# Patient Record
Sex: Male | Born: 1975
Health system: Southern US, Community
[De-identification: ages and names within clinical notes are randomized; demographics above are authoritative.]

## PROBLEM LIST (undated history)

## (undated) DIAGNOSIS — F32A Depression, unspecified: Secondary | ICD-10-CM

## (undated) DIAGNOSIS — I10 Essential (primary) hypertension: Secondary | ICD-10-CM

## (undated) DIAGNOSIS — N289 Disorder of kidney and ureter, unspecified: Secondary | ICD-10-CM

## (undated) DIAGNOSIS — D649 Anemia, unspecified: Secondary | ICD-10-CM

## (undated) DIAGNOSIS — E119 Type 2 diabetes mellitus without complications: Secondary | ICD-10-CM

---

## 2011-07-28 ENCOUNTER — Emergency Department (HOSPITAL_COMMUNITY)
Admission: EM | Admit: 2011-07-28 | Discharge: 2011-07-28 | Disposition: A | Discharge status: Home / Self Care | Payer: Self-pay | Attending: Emergency Medicine | Admitting: Emergency Medicine

## 2011-07-28 DIAGNOSIS — Z79899 Other long term (current) drug therapy: Secondary | ICD-10-CM | POA: Insufficient documentation

## 2011-07-28 DIAGNOSIS — E119 Type 2 diabetes mellitus without complications: Secondary | ICD-10-CM | POA: Insufficient documentation

## 2011-07-28 DIAGNOSIS — K089 Disorder of teeth and supporting structures, unspecified: Secondary | ICD-10-CM | POA: Insufficient documentation

## 2011-07-28 DIAGNOSIS — I10 Essential (primary) hypertension: Secondary | ICD-10-CM | POA: Insufficient documentation

## 2011-07-28 DIAGNOSIS — K0381 Cracked tooth: Secondary | ICD-10-CM | POA: Insufficient documentation

## 2011-07-28 LAB — GLUCOSE, CAPILLARY: Glucose-Capillary: 303 mg/dL — ABNORMAL HIGH (ref 70–99)

## 2014-01-31 ENCOUNTER — Emergency Department (HOSPITAL_COMMUNITY)
Admission: EM | Admit: 2014-01-31 | Discharge: 2014-01-31 | Disposition: A | Discharge status: Home / Self Care | Payer: Medicaid Other | Source: Home / Self Care | Attending: Emergency Medicine | Admitting: Emergency Medicine

## 2014-01-31 ENCOUNTER — Encounter (HOSPITAL_COMMUNITY): Payer: Self-pay | Admitting: Emergency Medicine

## 2014-01-31 DIAGNOSIS — K047 Periapical abscess without sinus: Secondary | ICD-10-CM

## 2014-01-31 HISTORY — DX: Type 2 diabetes mellitus without complications: E11.9

## 2014-01-31 HISTORY — DX: Essential (primary) hypertension: I10

## 2014-01-31 MED ORDER — MELOXICAM 15 MG PO TABS: 15.0000 mg | ORAL_TABLET | Freq: Every day | ORAL | Status: DC

## 2014-01-31 MED ORDER — METRONIDAZOLE 500 MG PO TABS: 500.0000 mg | ORAL_TABLET | Freq: Three times a day (TID) | ORAL | Status: DC

## 2014-01-31 MED ORDER — OXYCODONE-ACETAMINOPHEN 5-325 MG PO TABS: ORAL_TABLET | ORAL | Status: DC

## 2014-01-31 MED ORDER — HYDROCODONE-ACETAMINOPHEN 5-325 MG PO TABS
2.0000 | ORAL_TABLET | Freq: Once | ORAL | Status: AC
  Administered 2014-01-31: 2 via ORAL

## 2014-01-31 MED ORDER — AMOXICILLIN 500 MG PO CAPS: 500.0000 mg | ORAL_CAPSULE | Freq: Three times a day (TID) | ORAL | Status: DC

## 2014-01-31 MED ORDER — HYDROCODONE-ACETAMINOPHEN 5-325 MG PO TABS
ORAL_TABLET | ORAL | Status: AC
  Filled 2014-01-31: qty 2

## 2014-01-31 NOTE — ED Notes (Signed)
Spoke to wife on discharge and instructed her to drive patient , patient medicated and cannot drive

## 2014-01-31 NOTE — Discharge Instructions (Signed)
Look up the [place] [facility] for free dental clinics. undoomedical.com.asp  Get there early and be prepared to wait. [facility] and [employer] have Copywriter, advertising schools that provide low cost routine dental care.   Other resources: [facility] [place], [place] [phone]  Patients with Medicaid: [place] [address] [place] Phone:  [phone]                                                  Phone:  [phone]  If unable to pay or uninsured, contact:  [facility] or [facility]. to become qualified for the adult dental clinic.  No matter what dental problem you have, it will not get better unless you get good dental care.  If the tooth is not taken care of, your symptoms will come back in time and you will be visiting [place] again in the [facility]gent [place] with a bad toothache.  So, see your dentist as soon as possible.  If you don't have a dentist, we can give you a list of dentists.  Sometimes the most cost effective treatment is removal of the tooth.  This can be done very inexpensively through one of the low cost Dance movement psychotherapist such as the facility on [facility] in [place] [phone]).  The downside to this is that you will have one less tooth and this can effect your ability to chew.  Some other things that can be done for a dental infection include the following:   Rinse your mouth out with hot salt water (1/2 tsp of table salt and a pinch of baking soda in 8 oz of hot water).  You can do this every 2 or 3 hours.  Avoid cold foods, beverages, and cold air.  This will make your symptoms worse.  Sleep with your head elevated.  Sleeping flat will cause your gums and oral tissues to swell and make them hurt more.  You can sleep on several pillows.  Even  better is to sleep in a recliner with your head higher than your heart.  For mild to moderate pain, you can take Tylenol, ibuprofen, or Aleve.  External application of heat by a heating pad, hot water bottle, or hot wet towel can help with pain and speed healing.  You can do this every 2 to 3 hours. Do not fall asleep on a heating pad since this can cause a burn.   Go to www.goodrx.com to look up your medications. This will give you a list of where you can find your prescriptions at the most affordable prices.   RESOURCE GUIDE  Dental Problems  Look up SuperiorMarketers.be.asp for a schedule of the [facility] [facility]'s free dental clinics called [place]. They have clinics all around [place]. Get there early and be prepared to wait.   [facility] [address]  [place],  [address] [phone]  [facility]  [place], [place] [phone]  Patients with Medicaid: [place] [address] [place] Phone:  [phone]                                                  Phone:  [phone]  If unable to pay or uninsured, contact:  [facility] or [facility]. to become qualified for the adult dental clinic.

## 2014-01-31 NOTE — ED Provider Notes (Signed)
  Chief Complaint   Chief Complaint  Patient presents with  . Dental Pain    History of Present Illness   Levi Hodges is a 38 year old male who has a three-day history of pain in his left, upper, first incisor. It's loose and he feels like is about to fall. There is swelling of the gingiva and some purulent drainage. He's also had some headache and neck pain. Denies fevers, chills, facial pain, neck swelling, difficulty swallowing or breathing, chest pain or shortness of breath.  Review of Systems   Other than as noted above, the patient denies any of the following symptoms: Systemic:  No fever or chills. ENT:  No headache, ear ache, sore throat, nasal congestion, facial pain, or swelling. Lymphatic:  No adenopathy. Lungs:  No coughing or shortness of breath.  Ren[place]   Past medical history, family history, social history, meds, and allergies were reviewed. He has diabetes and high blood pressure and takes Lipitor, glimepiride, and lisinopril.  Physical Examination     Vital signs:  BP 146/99  Pulse 78  Temp(Src) 98.3 F (36.8 C) (Oral)  Resp 18  SpO2 98% General:  Alert, oriented, in no distress. ENT:  TMs and canals normal.  Nasal mucosa normal. Mouth exam:  He is widespread dental decay. The left, upper, first incisor is very loose and appears to be about to fall. The gingiva surrounding it is swollen, erythematous, and feels a little fluctuant, although there is no visible collection of pus. No swelling of the tongue the floor the mouth. The pharynx is clear, he is able to open his mouth fully. Neck:  No swelling or adenopathy. Lungs:  Breath sounds clear and equal bilaterally.  No wheezes, rales or rhonchi. Heart:  Regular rhythm.  No gallops or murmers. Skin:  Clear, warm and dry.   Assessment   The encounter diagnosis was Dental abscess.  Plan   1.  Meds:  The following meds were prescribed:   Discharge Medication List as of 01/31/2014  9:58 AM    START taking  these medications   Details  amoxicillin (AMOXIL) 500 MG capsule Take 1 capsule (500 mg total) by mouth 3 (three) times daily., Starting 01/31/2014, Until Discontinued, Normal    meloxicam (MOBIC) 15 MG tablet Take 1 tablet (15 mg total) by mouth daily., Starting 01/31/2014, Until Discontinued, Normal    metroNIDAZOLE (FLAGYL) 500 MG tablet Take 1 tablet (500 mg total) by mouth 3 (three) times daily., Starting 01/31/2014, Until Discontinued, Normal    oxyCODONE-acetaminophen (PERCOCET) 5-325 MG per tablet 1 to 2 tablets every 6 hours as needed for pain., Print        2.  Patient Education/Counseling:  The patient was given appropriate handouts, self care instructions, and instructed in symptomatic relief. Suggested sleeping with head of bed elevated and hot salt water mouthwash.   3.  Follow up:  The patient was told to follow up if no better in 3 to 4 days, if becoming worse in any way, and given some red flag symptoms such as difficulty swallowing or breathing which would prompt immediate return.  Follow up with a dentist as soon as posssible.     Harden Mo, MD 01/31/14 [phone]

## 2014-01-31 NOTE — ED Notes (Signed)
Dental pain, left front tooth pain.  Onset 3 days ago

## 2017-01-05 ENCOUNTER — Encounter: Payer: Self-pay | Admitting: Internal Medicine

## 2017-01-05 ENCOUNTER — Other Ambulatory Visit (INDEPENDENT_AMBULATORY_CARE_PROVIDER_SITE_OTHER): Payer: 59

## 2017-01-05 ENCOUNTER — Encounter: Payer: Self-pay | Admitting: General Practice

## 2017-01-05 ENCOUNTER — Ambulatory Visit (INDEPENDENT_AMBULATORY_CARE_PROVIDER_SITE_OTHER): Payer: 59 | Admitting: Internal Medicine

## 2017-01-05 VITALS — BP 162/106 | HR 85 | Temp 98.4°F | Ht 69.0 in | Wt 192.0 lb

## 2017-01-05 DIAGNOSIS — E1169 Type 2 diabetes mellitus with other specified complication: Secondary | ICD-10-CM | POA: Diagnosis not present

## 2017-01-05 DIAGNOSIS — R7989 Other specified abnormal findings of blood chemistry: Secondary | ICD-10-CM

## 2017-01-05 DIAGNOSIS — N521 Erectile dysfunction due to diseases classified elsewhere: Secondary | ICD-10-CM

## 2017-01-05 DIAGNOSIS — N529 Male erectile dysfunction, unspecified: Secondary | ICD-10-CM | POA: Diagnosis not present

## 2017-01-05 DIAGNOSIS — IMO0001 Reserved for inherently not codable concepts without codable children: Secondary | ICD-10-CM

## 2017-01-05 DIAGNOSIS — Z23 Encounter for immunization: Secondary | ICD-10-CM

## 2017-01-05 DIAGNOSIS — E1165 Type 2 diabetes mellitus with hyperglycemia: Secondary | ICD-10-CM | POA: Diagnosis not present

## 2017-01-05 DIAGNOSIS — Z720 Tobacco use: Secondary | ICD-10-CM | POA: Diagnosis not present

## 2017-01-05 DIAGNOSIS — I10 Essential (primary) hypertension: Secondary | ICD-10-CM | POA: Diagnosis not present

## 2017-01-05 DIAGNOSIS — E1159 Type 2 diabetes mellitus with other circulatory complications: Secondary | ICD-10-CM | POA: Insufficient documentation

## 2017-01-05 DIAGNOSIS — IMO0002 Reserved for concepts with insufficient information to code with codable children: Secondary | ICD-10-CM | POA: Insufficient documentation

## 2017-01-05 DIAGNOSIS — E785 Hyperlipidemia, unspecified: Secondary | ICD-10-CM | POA: Diagnosis not present

## 2017-01-05 DIAGNOSIS — E11319 Type 2 diabetes mellitus with unspecified diabetic retinopathy without macular edema: Secondary | ICD-10-CM | POA: Diagnosis not present

## 2017-01-05 LAB — COMPREHENSIVE METABOLIC PANEL
ALT: 16 U/L (ref 0–53)
AST: 16 U/L (ref 0–37)
Albumin: 4 g/dL (ref 3.5–5.2)
Alkaline Phosphatase: 99 U/L (ref 39–117)
BUN: 12 mg/dL (ref 6–23)
CHLORIDE: 97 meq/L (ref 96–112)
CO2: 27 meq/L (ref 19–32)
Calcium: 9.1 mg/dL (ref 8.4–10.5)
Creatinine, Ser: 1.33 mg/dL (ref 0.40–1.50)
GFR: 76.01 mL/min (ref >60.00)
Glucose, Bld: 465 mg/dL — ABNORMAL HIGH (ref 70–99)
Potassium: 4 mEq/L (ref 3.5–5.1)
Sodium: 132 mEq/L — ABNORMAL LOW (ref 135–145)
Total Bilirubin: 0.4 mg/dL (ref 0.2–1.2)
Total Protein: 7.2 g/dL (ref 6.0–8.3)

## 2017-01-05 LAB — LIPID PANEL
CHOL/HDL RATIO: 6
Cholesterol: 214 mg/dL — ABNORMAL HIGH (ref 0–200)
HDL: 36 mg/dL — ABNORMAL LOW (ref >39.00)
NONHDL: 177.59
TRIGLYCERIDES: 351 mg/dL — AB (ref 0.0–149.0)
VLDL: 70.2 mg/dL — AB (ref 0.0–40.0)

## 2017-01-05 LAB — CBC
HCT: 45.3 % (ref 39.0–52.0)
HEMOGLOBIN: 15.2 g/dL (ref 13.0–17.0)
MCHC: 33.6 g/dL (ref 30.0–36.0)
MCV: 81.8 fl (ref 78.0–100.0)
PLATELETS: 317 10*3/uL (ref 150.0–400.0)
RBC: 5.54 Mil/uL (ref 4.22–5.81)
RDW: 13.5 % (ref 11.5–15.5)
WBC: 11.7 10*3/uL — ABNORMAL HIGH (ref 4.0–10.5)

## 2017-01-05 LAB — HEMOGLOBIN A1C: Hgb A1c MFr Bld: 14 % — ABNORMAL HIGH (ref 4.6–6.5)

## 2017-01-05 LAB — MICROALBUMIN / CREATININE URINE RATIO
Creatinine,U: 96.3 mg/dL
MICROALB UR: 77.8 mg/dL — AB (ref 0.0–1.9)
Microalb Creat Ratio: 80.8 mg/g — ABNORMAL HIGH (ref 0.0–30.0)

## 2017-01-05 LAB — LDL CHOLESTEROL, DIRECT: Direct LDL: 115 mg/dL

## 2017-01-05 LAB — HM DIABETES EYE EXAM

## 2017-01-05 MED ORDER — LISINOPRIL-HYDROCHLOROTHIAZIDE 20-25 MG PO TABS
1.0000 | ORAL_TABLET | Freq: Every day | ORAL | 3 refills | Status: DC
Start: 2017-01-05 — End: 2017-06-02

## 2017-01-05 NOTE — Progress Notes (Unsigned)
Results entered and sent to scan  

## 2017-01-05 NOTE — Assessment & Plan Note (Signed)
BP elevated severely today and off meds for several years with his lack of insurance. Rx for lisinopril/hctz 20/25 today and bring back in 1 month or so for recheck. Checking CMP today and adjust as needed. Not known if complicated although suspect some damage.

## 2017-01-05 NOTE — Assessment & Plan Note (Signed)
Smoking at least 1 PPD and no desire to quit at this time. Has not been successful with quitting in the past. Counseled him about the risks and harms associated with smoking and the need to quit for his health.

## 2017-01-05 NOTE — Assessment & Plan Note (Signed)
Goal LDL <70 since CV risk is about 16% currently. Has been on lipitor in the past without side effects.

## 2017-01-05 NOTE — Assessment & Plan Note (Signed)
Likely vasogenic with his severe uncontrolled hypertension and diabetes. Talked to them about controlling blood pressure and seeing if ED improves. If it does not then he may have permanent damage. Given high BP will not rx viagra at this time until medical problems controlled.

## 2017-01-05 NOTE — Patient Instructions (Signed)
We have given you the tetanus shot today.   We are checking the labs and will call you back with the sugar medicine and if you need a cholesterol medicine.  We have sent in lisinopril/hctz for the blood pressure which is 1 pill daily.   Come back in 1-2 months for follow up of the blood pressure and sugars.

## 2017-01-05 NOTE — Progress Notes (Signed)
   Subjective:    Patient ID: Levi Hodges, male    DOB: 02-Jan-1976, 41 y.o.   MRN: [id number]  HPI The patient is a 41 YO man coming in new for worsening health including his blood pressure (not well controlled, previously was taking lisinopril 40 mg daily, no chest pains, having headaches, no abdominal pain), diabetes (not taking anything right now, no follow up in some time for this due to lack of insurance, denies numbness in his feet or burning, eye exam today without changes), and his cholesterol (associated with his diabetes, not on meds in some time due to lack of insurance, denies chest pains). He also is a smoker and is not ready to quit at this time. Also having ED and wonders if this is related to his diabetes and blood pressure not being controlled. He has tried viagra in the past and it worked (at least 5 years ago). Problems with initiating and maintaining erection.   PMH, [facility], social history reviewed and updated.   Review of Systems  Constitutional: Negative.   HENT: Negative.   Eyes: Negative.   Respiratory: Negative for cough, chest tightness and shortness of breath.   Cardiovascular: Negative for chest pain, palpitations and leg swelling.  Gastrointestinal: Negative for abdominal distention, abdominal pain, constipation, diarrhea, nausea and vomiting.  Genitourinary:       ED  Musculoskeletal: Positive for back pain. Negative for arthralgias, gait problem, joint swelling, myalgias, neck pain and neck stiffness.  Skin: Negative.   Neurological: Positive for headaches. Negative for dizziness, tremors, syncope, weakness, light-headedness and numbness.  Psychiatric/Behavioral: Negative.       Objective:   Physical Exam  Constitutional: He is oriented to person, place, and time. He appears well-developed and well-nourished.  HENT:  Head: Normocephalic and atraumatic.  Eyes: EOM are normal.  Neck: Normal range of motion.  Cardiovascular: Normal rate and regular rhythm.     Pulmonary/Chest: Effort normal and breath sounds normal. No respiratory distress. He has no wheezes. He has no rales.  Abdominal: Soft. Bowel sounds are normal. He exhibits no distension. There is no tenderness. There is no rebound.  Musculoskeletal: He exhibits no edema.  Neurological: He is alert and oriented to person, place, and time. Coordination normal.  Skin: Skin is warm and dry.  Psychiatric: He has a normal mood and affect.   Vitals:   01/05/17 1321 01/05/17 1408  BP: (!) 170/108 (!) 162/106  Pulse: 85   Temp: 98.4 F (36.9 C)   TempSrc: Oral   SpO2: 100%   Weight: 192 lb (87.1 kg)   Height: 5\' 9"  (1.753 m)       Assessment & Plan:  Tdap given at visit

## 2017-01-05 NOTE — Assessment & Plan Note (Addendum)
Previously was on toujeo at some point and cannot tolerate metformin. Checking HgA1c and adjust as needed. Likely will need medication and talked to him about that today. He is willing to do what is needed to bring his sugars down now that he has insurance. Eye exam up to date. Foot exam done today without changes. Checking microalbumin to creatinine ratio for complications.

## 2017-01-05 NOTE — Progress Notes (Signed)
Pre visit review using our clinic review tool, if applicable. No additional management support is needed unless otherwise documented below in the visit note. 

## 2017-01-07 ENCOUNTER — Other Ambulatory Visit: Payer: Self-pay | Admitting: Internal Medicine

## 2017-01-07 MED ORDER — EMPAGLIFLOZIN 25 MG PO TABS: 25.0000 mg | ORAL_TABLET | Freq: Every day | ORAL | 0 refills | Status: DC

## 2017-01-07 MED ORDER — SIMVASTATIN 20 MG PO TABS: 20.0000 mg | ORAL_TABLET | Freq: Every day | ORAL | 3 refills | Status: DC

## 2017-01-07 MED ORDER — GLIPIZIDE ER 10 MG PO TB24: 10.0000 mg | ORAL_TABLET | Freq: Every day | ORAL | 0 refills | Status: DC

## 2017-01-08 ENCOUNTER — Telehealth: Payer: Self-pay

## 2017-01-08 NOTE — Telephone Encounter (Signed)
PA for Levi Hodges has been denied, patient hasn't tried anything else in past and that's why it was denied, wanting to see if we can change medication to something else that is similar. Please advise

## 2017-01-08 NOTE — Telephone Encounter (Signed)
Ok to wait until Dr Sharlet Salina returns

## 2017-01-12 ENCOUNTER — Telehealth: Payer: Self-pay | Admitting: Internal Medicine

## 2017-01-12 MED ORDER — DAPAGLIFLOZIN PROPANEDIOL 10 MG PO TABS: 10.0000 mg | ORAL_TABLET | Freq: Every day | ORAL | 6 refills | Status: DC

## 2017-01-12 NOTE — Telephone Encounter (Signed)
Have sent in [place] which is similar to see if it is covered.

## 2017-01-12 NOTE — Telephone Encounter (Signed)
Pt's spouse called request to speak to the nurse about Mr. Youtsey lab result.

## 2017-01-12 NOTE — Addendum Note (Signed)
Addended by: Pricilla Holm A on: 01/12/2017 11:12 AM   Modules accepted: Orders

## 2017-01-12 NOTE — Telephone Encounter (Signed)
Contacted and stated awareness

## 2017-03-06 ENCOUNTER — Encounter: Payer: Self-pay | Admitting: Internal Medicine

## 2017-03-06 ENCOUNTER — Ambulatory Visit (INDEPENDENT_AMBULATORY_CARE_PROVIDER_SITE_OTHER): Payer: 59 | Admitting: Internal Medicine

## 2017-03-06 ENCOUNTER — Other Ambulatory Visit (INDEPENDENT_AMBULATORY_CARE_PROVIDER_SITE_OTHER): Payer: 59

## 2017-03-06 VITALS — BP 140/90 | HR 64 | Temp 98.9°F | Resp 12 | Ht 69.0 in | Wt 188.0 lb

## 2017-03-06 DIAGNOSIS — I10 Essential (primary) hypertension: Secondary | ICD-10-CM

## 2017-03-06 DIAGNOSIS — E785 Hyperlipidemia, unspecified: Secondary | ICD-10-CM | POA: Diagnosis not present

## 2017-03-06 DIAGNOSIS — E1165 Type 2 diabetes mellitus with hyperglycemia: Secondary | ICD-10-CM

## 2017-03-06 DIAGNOSIS — N521 Erectile dysfunction due to diseases classified elsewhere: Secondary | ICD-10-CM

## 2017-03-06 DIAGNOSIS — E1169 Type 2 diabetes mellitus with other specified complication: Secondary | ICD-10-CM

## 2017-03-06 DIAGNOSIS — E1159 Type 2 diabetes mellitus with other circulatory complications: Secondary | ICD-10-CM

## 2017-03-06 DIAGNOSIS — IMO0002 Reserved for concepts with insufficient information to code with codable children: Secondary | ICD-10-CM

## 2017-03-06 DIAGNOSIS — IMO0001 Reserved for inherently not codable concepts without codable children: Secondary | ICD-10-CM

## 2017-03-06 LAB — COMPREHENSIVE METABOLIC PANEL
ALT: 19 U/L (ref 0–53)
AST: 23 U/L (ref 0–37)
Albumin: 4.3 g/dL (ref 3.5–5.2)
Alkaline Phosphatase: 74 U/L (ref 39–117)
BUN: 19 mg/dL (ref 6–23)
CHLORIDE: 104 meq/L (ref 96–112)
CO2: 26 meq/L (ref 19–32)
CREATININE: 1.4 mg/dL (ref 0.40–1.50)
Calcium: 9.2 mg/dL (ref 8.4–10.5)
GFR: 71.59 mL/min (ref >60.00)
Glucose, Bld: 260 mg/dL — ABNORMAL HIGH (ref 70–99)
Potassium: 3.9 mEq/L (ref 3.5–5.1)
Sodium: 136 mEq/L (ref 135–145)
Total Bilirubin: 0.4 mg/dL (ref 0.2–1.2)
Total Protein: 7.4 g/dL (ref 6.0–8.3)

## 2017-03-06 MED ORDER — DAPAGLIFLOZIN PROPANEDIOL 10 MG PO TABS: 10.0000 mg | ORAL_TABLET | Freq: Every day | ORAL | 6 refills | Status: DC

## 2017-03-06 MED ORDER — GLIPIZIDE ER 10 MG PO TB24: 10.0000 mg | ORAL_TABLET | Freq: Every day | ORAL | 3 refills | Status: DC

## 2017-03-06 NOTE — Progress Notes (Signed)
   Subjective:    Patient ID: Levi Hodges, male    DOB: February 14, 1976, 41 y.o.   MRN: [id number]  HPI The patient is a 41 YO man coming in for follow up of his diabetes (sugars running about 140-200 in the morning, did not get jardiance due to cost and then did not get farxiga due to not knowing it was at the pharmacy, taking glipizide daily, diet about the same, not exercising, some numbness and erectile dysfunction complicating this) and blood pressure (BP better than prior, taking lisinopril/hctz, no headaches or fatigue or abdominal pain or chest pains anymore since starting the medicine, denies side effects), and his cholesterol (started taking simvastatin, denies side effects, no change he has noticed).   Review of Systems  Constitutional: Negative for activity change, appetite change, fatigue, fever and unexpected weight change.  HENT: Negative.   Eyes: Negative.   Respiratory: Negative.   Cardiovascular: Negative.   Gastrointestinal: Negative.   Musculoskeletal: Negative.   Skin: Negative.   Neurological: Positive for numbness. Negative for dizziness, tremors, syncope, facial asymmetry and weakness.  Psychiatric/Behavioral: Negative.       Objective:   Physical Exam  Constitutional: He is oriented to person, place, and time. He appears well-developed and well-nourished.  HENT:  Head: Normocephalic and atraumatic.  Eyes: EOM are normal.  Neck: Normal range of motion.  Cardiovascular: Normal rate and regular rhythm.   Pulmonary/Chest: Effort normal.  Abdominal: Soft.  Musculoskeletal: He exhibits no edema.  Neurological: He is alert and oriented to person, place, and time.  Skin: Skin is warm and dry.  Psychiatric: He has a normal mood and affect.   Vitals:   03/06/17 0929  BP: 140/90  Pulse: 64  Resp: 12  Temp: 98.9 F (37.2 C)  TempSrc: Oral  SpO2: 98%  Weight: 188 lb (85.3 kg)  Height: 5\' 9"  (1.753 m)      Assessment & Plan:

## 2017-03-06 NOTE — Patient Instructions (Signed)
We will check the labs today. We have resent the farxiga to the pharmacy to try to fill.    Diabetes Mellitus and Standards of Medical Care Managing diabetes (diabetes mellitus) can be complicated. Your diabetes treatment may be managed by a team of health care providers, including:  A diet and nutrition specialist (registered dietitian).  A nurse.  A certified diabetes educator (CDE).  A diabetes specialist (endocrinologist).  An eye doctor.  A primary care provider.  A dentist. Your health care providers follow a schedule in order to help you get the best quality of care. The following schedule is a general guideline for your diabetes management plan. Your health care providers may also give you more specific instructions. HbA1c ( hemoglobin A1c) test This test provides information about blood sugar (glucose) control over the previous 2-3 months. It is used to check whether your diabetes management plan needs to be adjusted.  If you are meeting your treatment goals, this test is done at least 2 times a year.  If you are not meeting treatment goals or if your treatment goals have changed, this test is done 4 times a year. Blood pressure test  This test is done at every routine medical visit. For most people, the goal is less than 130/80. Ask your health care provider what your goal blood pressure should be. Dental and eye exams  Visit your dentist two times a year.  If you have type 1 diabetes, get an eye exam 3-5 years after you are diagnosed, and then once a year after your first exam.  If you were diagnosed with type 1 diabetes as a child, get an eye exam when you are age 58 or older and have had diabetes for 3-5 years. After the first exam, you should get an eye exam once a year.  If you have type 2 diabetes, have an eye exam as soon as you are diagnosed, and then once a year after your first exam. Foot care exam  Visual foot exams are done at every routine medical  visit. The exams check for cuts, bruises, redness, blisters, sores, or other problems with the feet.  A complete foot exam is done by your health care provider once a year. This exam includes an inspection of the structure and skin of your feet, and a check of the pulses and sensation in your feet.  Type 1 diabetes: Get your first exam 3-5 years after diagnosis.  Type 2 diabetes: Get your first exam as soon as you are diagnosed.  Check your feet every day for cuts, bruises, redness, blisters, or sores. If you have any of these or other problems that are not healing, contact your health care provider. Kidney function test ( urine microalbumin)  This test is done once a year.  Type 1 diabetes: Get your first test 5 years after diagnosis.  Type 2 diabetes: Get your first test as soon as you are diagnosed.  If you have chronic kidney disease (CKD), get a serum creatinine and estimated glomerular filtration rate (eGFR) test once a year. Lipid profile (cholesterol, HDL, LDL, triglycerides)  This test should be done when you are diagnosed with diabetes, and every 5 years after the first test. If you are on medicines to lower your cholesterol, you may need to get this test done every year.  The goal for LDL is less than 100 mg/dL (5.5 mmol/L). If you are at high risk, the goal is less than 70 mg/dL (3.9 mmol/L).  The goal for triglycerides is less than 150 mg/dL (8.3 mmol/L). Immunizations  The yearly flu (influenza) vaccine is recommended for everyone 6 months or older who has diabetes.  The pneumonia (pneumococcal) vaccine is recommended for everyone 2 years or older who has diabetes. If you are 15 or older, you may get the pneumonia vaccine as a series of two separate shots.  The hepatitis B vaccine is recommended for adults shortly after they have been diagnosed with diabetes.     Mental and emotional health  Screening for symptoms of eating disorders, anxiety, and depression  is recommended at the time of diagnosis and afterward as needed. If your screening shows that you have symptoms (you have a positive screening result), you may need further evaluation and be referred to a mental health care provider. Diabetes self-management education  Education about how to manage your diabetes is recommended at diagnosis and ongoing as needed. Treatment plan  Your treatment plan will be reviewed at every medical visit. Summary  Managing diabetes (diabetes mellitus) can be complicated. Your diabetes treatment may be managed by a team of health care providers.  Your health care providers follow a schedule in order to help you get the best quality of care.  Standards of care including having regular physical exams, blood tests, blood pressure monitoring, immunizations, screening tests, and education about how to manage your diabetes.  Your health care providers may also give you more specific instructions based on your individual health. This information is not intended to replace advice given to you by your health care provider. Make sure you discuss any questions you have with your health care provider. Document Released: 08/17/2009 Document Revised: 07/18/2016 Document Reviewed: 07/18/2016 Elsevier Interactive Patient Education  2017 [employer].

## 2017-03-06 NOTE — Progress Notes (Signed)
Pre visit review using our clinic review tool, if applicable. No additional management support is needed unless otherwise documented below in the visit note. 

## 2017-03-08 NOTE — Assessment & Plan Note (Signed)
BP is better controlled than before but still borderline high. Given that his diabetes is very uncontrolled and he is hesitant to take more meds will watch for now and add medication if still borderline at follow up visit in 3 months.

## 2017-03-08 NOTE — Assessment & Plan Note (Signed)
Taking simvastatin 20 mg daily without problems. Continue and recheck cholesterol in about 6 months.

## 2017-03-08 NOTE — Assessment & Plan Note (Signed)
Taking glipizide and will resend farxiga for better control. Last HgA1c 14% so likely will not be at goal now. Less neuropathy symptoms now with meds than before. Checking fructosamine for control as too early for HgA1c. He is on ACE-I and statin now. He is working on better control but this will be a long process with such poor control for so long.

## 2017-03-09 LAB — FRUCTOSAMINE: FRUCTOSAMINE: 404 umol/L — AB (ref 190–270)

## 2017-03-11 ENCOUNTER — Telehealth: Payer: Self-pay | Admitting: *Deleted

## 2017-03-11 ENCOUNTER — Telehealth: Payer: Self-pay

## 2017-03-11 MED ORDER — CANAGLIFLOZIN 300 MG PO TABS: 300.0000 mg | ORAL_TABLET | Freq: Every day | ORAL | 6 refills | Status: DC

## 2017-03-11 NOTE — Telephone Encounter (Signed)
Tried calling pt to let him know of medciation change # on file is not his #. It was a business and the young lady stated there is no one work here by that name...Levi Hodges

## 2017-03-11 NOTE — Telephone Encounter (Signed)
error 

## 2017-03-11 NOTE — Telephone Encounter (Signed)
Invokana sent in.

## 2017-03-11 NOTE — Telephone Encounter (Signed)
Rec'd fax stating pt insurance plan does not cover [place], the preferred alternative is Invokana tab. Pls call/fax to change medication...Johny Chess

## 2017-03-23 ENCOUNTER — Telehealth: Payer: Self-pay

## 2017-03-23 NOTE — Telephone Encounter (Signed)
error 

## 2017-05-14 ENCOUNTER — Encounter (HOSPITAL_COMMUNITY): Payer: Self-pay

## 2017-05-14 ENCOUNTER — Emergency Department (HOSPITAL_COMMUNITY)
Admission: EM | Admit: 2017-05-14 | Discharge: 2017-05-14 | Disposition: A | Discharge status: Home / Self Care | Payer: 59 | Attending: Emergency Medicine | Admitting: Emergency Medicine

## 2017-05-14 DIAGNOSIS — Z79899 Other long term (current) drug therapy: Secondary | ICD-10-CM | POA: Diagnosis not present

## 2017-05-14 DIAGNOSIS — K0889 Other specified disorders of teeth and supporting structures: Secondary | ICD-10-CM | POA: Diagnosis not present

## 2017-05-14 DIAGNOSIS — F17200 Nicotine dependence, unspecified, uncomplicated: Secondary | ICD-10-CM | POA: Insufficient documentation

## 2017-05-14 DIAGNOSIS — Z7984 Long term (current) use of oral hypoglycemic drugs: Secondary | ICD-10-CM | POA: Insufficient documentation

## 2017-05-14 DIAGNOSIS — I10 Essential (primary) hypertension: Secondary | ICD-10-CM | POA: Insufficient documentation

## 2017-05-14 DIAGNOSIS — E119 Type 2 diabetes mellitus without complications: Secondary | ICD-10-CM | POA: Diagnosis not present

## 2017-05-14 MED ORDER — IBUPROFEN 600 MG PO TABS: 600.0000 mg | ORAL_TABLET | Freq: Four times a day (QID) | ORAL | 0 refills | Status: DC | PRN

## 2017-05-14 MED ORDER — PENICILLIN V POTASSIUM 500 MG PO TABS: 500.0000 mg | ORAL_TABLET | Freq: Two times a day (BID) | ORAL | 0 refills | Status: AC

## 2017-05-14 MED ORDER — HYDROCODONE-ACETAMINOPHEN 5-325 MG PO TABS: 2.0000 | ORAL_TABLET | ORAL | 0 refills | Status: DC | PRN

## 2017-05-14 NOTE — ED Provider Notes (Signed)
[facility] DEPT Provider Note   CSN: [id number] Arrival date & time: 05/14/17  1730  By signing my name below, I, Theresia Bough, attest that this documentation has been prepared under the direction and in the presence of non-physician practitioner, Janeece Agee., PA-C. Electronically Signed: Theresia Bough, ED Scribe. 05/14/17. 7:09 PM.  History   Chief Complaint Chief Complaint  Patient presents with  . Dental Pain   The history is provided by the patient. No language interpreter was used.   HPI Comments: Levi Hodges is a 40 y.o. male who presents to the Emergency Department complaining of a moderate, gradually worsening area of pain and swelling to the left lower gum of the mouth onset 2 weeks ago. Per pt, there has been drainage and associated mild facial swelling. Pt states pain is exacerbated with palpation and direct pressure. Pt has tried Advil for pain with moderate relief. No antibiotic use. Denies fever, chills, trouble swallowing, vomiting, SOB or any other complaints at this time.  Past Medical History:  Diagnosis Date  . Diabetes mellitus without complication ([place])   . Hypertension     Patient Active Problem List   Diagnosis Date Noted  . Uncontrolled type 2 diabetes circulatory disorder erectile dysfunction ([place]) 01/05/2017  . Hyperlipidemia associated with type 2 diabetes mellitus ([place]) 01/05/2017  . Essential hypertension 01/05/2017  . Tobacco abuse 01/05/2017  . ED (erectile dysfunction) 01/05/2017    History reviewed. No pertinent surgical history.     Home Medications    Prior to Admission medications   Medication Sig Start Date End Date Taking? Authorizing Provider  canagliflozin (INVOKANA) 300 MG TABS tablet Take 1 tablet (300 mg total) by mouth daily before breakfast. 03/11/17   Hoyt Koch, MD  glipiZIDE (GLUCOTROL XL) 10 MG 24 hr tablet Take 1 tablet (10 mg total) by mouth daily with breakfast. 03/06/17   Hoyt Koch,  MD  HYDROcodone-acetaminophen (NORCO/VICODIN) 5-325 MG tablet Take 2 tablets by mouth every 4 (four) hours as needed. 05/14/17   Nona Dell, PA-C  ibuprofen (ADVIL,MOTRIN) 600 MG tablet Take 1 tablet (600 mg total) by mouth every 6 (six) hours as needed. 05/14/17   Nona Dell, PA-C  lisinopril-hydrochlorothiazide (PRINZIDE,ZESTORETIC) 20-25 MG tablet Take 1 tablet by mouth daily. 01/05/17   Hoyt Koch, MD  penicillin v potassium (VEETID) 500 MG tablet Take 1 tablet (500 mg total) by mouth 2 (two) times daily. 05/14/17 05/21/17  Nona Dell, PA-C  simvastatin (ZOCOR) 20 MG tablet Take 1 tablet (20 mg total) by mouth at bedtime. 01/07/17   Hoyt Koch, MD    Family History No family history on file.  Social History Social History  Substance Use Topics  . Smoking status: Current Every Day Smoker  . Smokeless tobacco: Never Used  . Alcohol use No     Allergies   Patient has no known allergies.   Review of Systems Review of Systems  Constitutional: Negative for chills and fever.  HENT: Positive for dental problem and facial swelling. Negative for trouble swallowing.   Respiratory: Negative for shortness of breath.   Gastrointestinal: Negative for vomiting.     Physical Exam Updated Vital Signs BP (!) 172/94 (BP Location: Left Arm)   Pulse 78   Temp 98.2 F (36.8 C) (Oral)   Resp 16   Ht 5\' 9"  (1.753 m)   Wt 181 lb (82.1 kg)   SpO2 98%   BMI 26.73 kg/m   Physical Exam  Constitutional:  He is oriented to person, place, and time. He appears well-developed and well-nourished.  HENT:  Head: Normocephalic and atraumatic.  Mouth/Throat: Uvula is midline and oropharynx is clear and moist. No trismus in the jaw. Abnormal dentition. Dental abscesses and dental caries present. No uvula swelling. No oropharyngeal exudate, posterior oropharyngeal edema, posterior oropharyngeal erythema or tonsillar abscesses.    Mild swelling  present to left lower jaw. Floor of mouth soft. Pt tolerating secretions.   Eyes: Conjunctivae and EOM are normal. Right eye exhibits no discharge. Left eye exhibits no discharge. No scleral icterus.  Cardiovascular: Normal rate.   Pulmonary/Chest: Effort normal.  Neurological: He is alert and oriented to person, place, and time.  Nursing note and vitals reviewed.    ED Treatments / Results  DIAGNOSTIC STUDIES: Oxygen Saturation is 98% on RA, normal by my interpretation.   COORDINATION OF CARE: 6:52 PM-Discussed next steps with pt including antibiotic treatment. Pt verbalized understanding and is agreeable with the plan.   Labs (all labs ordered are listed, but only abnormal results are displayed) Labs Reviewed - No data to display  EKG  EKG Interpretation None       Radiology No results found.  Procedures Procedures (including critical care time)  Medications Ordered in ED Medications - No data to display   Initial Impression / Assessment and Plan / ED Course  I have reviewed the triage vital signs and the nursing notes.  Pertinent labs & imaging results that were available during my care of the patient were reviewed by me and considered in my medical decision making (see chart for details).      Patient with dental abscess actively draining. Supportive care and return precautions discussed.  Pt sent home with Penicillin and pain meds. Patient given outpatient dental resources and advised to follow-up for further management. Discussed return precautions. The patient appears reasonably screened and/or stabilized for discharge and I doubt any other emergent medical condition requiring further screening, evaluation, or treatment in the ED prior to discharge.    Final Clinical Impressions(s) / ED Diagnoses   Final diagnoses:  Pain, dental    New Prescriptions New Prescriptions   HYDROCODONE-ACETAMINOPHEN (NORCO/VICODIN) 5-325 MG TABLET    Take 2 tablets by  mouth every 4 (four) hours as needed.   IBUPROFEN (ADVIL,MOTRIN) 600 MG TABLET    Take 1 tablet (600 mg total) by mouth every 6 (six) hours as needed.   PENICILLIN V POTASSIUM (VEETID) 500 MG TABLET    Take 1 tablet (500 mg total) by mouth 2 (two) times daily.   I personally performed the services described in this documentation, which was scribed in my presence. The recorded information has been reviewed and is accurate.     Nona Dell, PA-C 05/14/17 1913    Orlie Dakin, MD 05/14/17 [phone]

## 2017-05-14 NOTE — Discharge Instructions (Signed)
Take medications as prescribed. You may also take 600 mg ibuprofen 4 times daily as needed for pain relief. I recommend eating prior to taking ibuprofen to prevent gastrointestinal side effects. You may apply ice to affected area for 15-20 minutes 3-4 times daily to help with pain. Follow-up with one of the dental clinics listed below for further management of your dental pain. Return to the emergency department if symptoms worsen or new onset of fever, headache, neck stiffness, facial/neck swelling, unable to open jaw, unable to swallow resulting in drooling, difficulty breathing, drainage, unable to tolerate fluids.   [facility] [address] [place], [place] [address] Phone [phone]  [facility] in [place], [place], exemplifies the [employer]?s vision to improve the health and quality of life of all [place] by Regulatory affairs officer with a passion to care for the underserved and by leading the nation in community-based, service learning oral health education.  We are committed to offering comprehensive general dental services for adults, children and special needs patients in a safe, caring and professional setting.   Appointments: Our clinic is open Monday through Friday 8:00 a.m. until 5:00 p.m. The amount of time scheduled for an appointment depends on the patient?s specific needs. We ask that you keep your appointed time for care or provide 24-hour notice of all appointment changes. Parents or legal guardians must accompany minor children.   Payment for Services: Medicaid and other insurance plans are welcome. Payment for services is due when services are rendered and may be made by cash or credit card. If you have dental insurance, we will assist you with your claim submission.    Emergencies:   Emergency services will be provided Monday through Friday on a walk-in basis.  Please arrive early for emergency services. After hours emergency services will be provided for patients of record as required.   Services:  Comprehensive General Dentistry Children?s Dentistry Oral Surgery - Extractions Root Canals Sealants and Tooth Colored Fillings Crowns and Camera operator 3-D/[facility]

## 2017-05-14 NOTE — ED Triage Notes (Signed)
Left lower tooth abscess that busted today while pt was at work. PT states he ned antibiotic. Denies fevers/chills

## 2017-06-02 ENCOUNTER — Encounter: Payer: Self-pay | Admitting: Family

## 2017-06-02 ENCOUNTER — Ambulatory Visit (INDEPENDENT_AMBULATORY_CARE_PROVIDER_SITE_OTHER): Payer: 59 | Admitting: Family

## 2017-06-02 ENCOUNTER — Other Ambulatory Visit (INDEPENDENT_AMBULATORY_CARE_PROVIDER_SITE_OTHER): Payer: 59

## 2017-06-02 ENCOUNTER — Ambulatory Visit: Payer: 59 | Admitting: Internal Medicine

## 2017-06-02 VITALS — BP 154/94 | HR 90 | Temp 98.8°F | Resp 16 | Ht 69.0 in | Wt 180.0 lb

## 2017-06-02 DIAGNOSIS — E785 Hyperlipidemia, unspecified: Secondary | ICD-10-CM

## 2017-06-02 DIAGNOSIS — E1169 Type 2 diabetes mellitus with other specified complication: Secondary | ICD-10-CM

## 2017-06-02 DIAGNOSIS — E1165 Type 2 diabetes mellitus with hyperglycemia: Secondary | ICD-10-CM

## 2017-06-02 DIAGNOSIS — IMO0002 Reserved for concepts with insufficient information to code with codable children: Secondary | ICD-10-CM

## 2017-06-02 DIAGNOSIS — E1159 Type 2 diabetes mellitus with other circulatory complications: Secondary | ICD-10-CM

## 2017-06-02 DIAGNOSIS — I10 Essential (primary) hypertension: Secondary | ICD-10-CM | POA: Diagnosis not present

## 2017-06-02 DIAGNOSIS — N521 Erectile dysfunction due to diseases classified elsewhere: Secondary | ICD-10-CM | POA: Diagnosis not present

## 2017-06-02 LAB — LIPID PANEL
CHOL/HDL RATIO: 5
Cholesterol: 168 mg/dL (ref 0–200)
HDL: 33.7 mg/dL — ABNORMAL LOW (ref >39.00)
LDL CALC: 97 mg/dL (ref 0–99)
NONHDL: 134.03
Triglycerides: 187 mg/dL — ABNORMAL HIGH (ref 0.0–149.0)
VLDL: 37.4 mg/dL (ref 0.0–40.0)

## 2017-06-02 LAB — HEMOGLOBIN A1C: Hgb A1c MFr Bld: 14.5 % — ABNORMAL HIGH (ref 4.6–6.5)

## 2017-06-02 MED ORDER — ONETOUCH ULTRA MINI W/DEVICE KIT: PACK | 0 refills | Status: DC

## 2017-06-02 MED ORDER — GLIPIZIDE ER 10 MG PO TB24: 10.0000 mg | ORAL_TABLET | Freq: Every day | ORAL | 3 refills | Status: DC

## 2017-06-02 MED ORDER — GLUCOSE BLOOD VI STRP: ORAL_STRIP | 12 refills | Status: DC

## 2017-06-02 MED ORDER — ONETOUCH SURESOFT LANCING DEV MISC: 1 refills | Status: DC

## 2017-06-02 MED ORDER — CANAGLIFLOZIN 300 MG PO TABS: 300.0000 mg | ORAL_TABLET | Freq: Every day | ORAL | 6 refills | Status: DC

## 2017-06-02 MED ORDER — SIMVASTATIN 20 MG PO TABS: 20.0000 mg | ORAL_TABLET | Freq: Every day | ORAL | 3 refills | Status: DC

## 2017-06-02 MED ORDER — LISINOPRIL-HYDROCHLOROTHIAZIDE 20-25 MG PO TABS: 1.0000 | ORAL_TABLET | Freq: Every day | ORAL | 3 refills | Status: DC

## 2017-06-02 NOTE — Assessment & Plan Note (Signed)
Obtain lipid profile. Appears stable with current dosage of simvastatin and no adverse side effects or myalgias. Continue current dosage of simvastatin pending lipid profile results.

## 2017-06-02 NOTE — Patient Instructions (Signed)
Thank you for choosing [facility].  SUMMARY AND INSTRUCTIONS:  Please continue to take the Glipizide as prescribed.  Monitor your blood pressure and blood sugars at home.   Start the [employer]. Check for coupons online at [email or web address]  Medication:  Your prescription(s) have been submitted to your pharmacy or been printed and provided for you. Please take as directed and contact our office if you believe you are having problem(s) with the medication(s) or have any questions.   Labs:  Please stop by the lab on the lower level of the building for your blood work. Your results will be released to [place] (or called to you) after review, usually within 72 hours after test completion. If any changes need to be made, you will be notified at that same time.  1.) The lab is open from 7:30am to 5:30 pm Monday-Friday 2.) No appointment is necessary 3.) Fasting (if needed) is 6-8 hours after food and drink; black coffee and water are okay   Follow up:  If your symptoms worsen or fail to improve, please contact our office for further instruction, or in case of emergency go directly to the emergency room at the closest medical facility.   DIABETES CARE INSTRUCTIONS:  Current A1c:  Lab Results  Component Value Date   HGBA1C 14.0 (H) 01/05/2017    A1C Goal: <7.0%  Fasting Blood Sugar Goal: 80-130 Blood sugar check that you take after fasting for at least 8 hours which is usually in the morning before eating.  Post-prandial Blood Sugar Goal: <180 Blood sugar check approximately 1-2 hours after the start of a meal.   Diabetes Prevention Screens:  1.) Ensure you have an annual eye exam by an ophthalmologist or optometrist.   2,) Ensure you have an annual foot exam or when any changes are noted including new onset numbness/tingling or wounds. Check your feet daily!  3.) The American Diabetes Association recommends the Pneumovax vaccination against pneumonia every 5 years.  4.) We  will check your kidney function with a urine test on an annual basis.

## 2017-06-02 NOTE — Assessment & Plan Note (Signed)
Type 2 diabetes previously poorly controlled and not currently checking blood sugars at home. Had extensive discussion regarding side effects of medications and need for lowering blood sugar to reduce risk of end organ damage in the future. Obtain hemoglobin A1c. Diabetic supplies sent to pharmacy. Encouraged to monitor blood sugars at home. Continue current dosage of glipizide and will trial Invokana. Diabetic prevention is up to date. Follow up in 3 months or sooner pending A1c results.

## 2017-06-02 NOTE — Assessment & Plan Note (Signed)
Low pressure remains labile and above goal 140/90 although significantly improved since previous. Encouraged follow low-sodium diet monitor blood pressure at home. Continue current dosage of lisinopril-hydrochlorothiazide. Consider additional medication of blood pressure remains elevated.

## 2017-06-02 NOTE — Progress Notes (Signed)
Subjective:    Patient ID: Levi Hodges, male    DOB: 1975/12/31, 41 y.o.   MRN: [id number]  Chief Complaint  Patient presents with  . Follow-up    diabetes    HPI:  Levi Hodges is a 41 y.o. male who  has a past medical history of Diabetes mellitus without complication ([place]) and Hypertension. and presents today for a follow up office visit.  1.) Diabetes - Currently prescribed Invokana and glipizide XL.  Not currently taking the Invokana secondary to concerns for "too many side effects and too many blood sugar medications."  Reports taking the glipizide as prescribed and denies adverse side effects or hypoglycemic readings. Does not currently check blood sugars at home and does not have any test strips . Denies new symptoms of end organ damage. No excessive hunger or thirst, but does have excessive urination. Working on a low/carbohydrate modified oral intake. Unable to take metformin secondary to cramps in his legs.  Lab Results  Component Value Date   HGBA1C 14.0 (H) 01/05/2017     Lab Results  Component Value Date   CREATININE 1.40 03/06/2017   BUN 19 03/06/2017   NA 136 03/06/2017   K 3.9 03/06/2017   CL 104 03/06/2017   CO2 26 03/06/2017    2.) Hyperlipidemia - Currently prescribed simvastatin. Reports taking the medication as prescribed and denies adverse side effects or myalgias. Continues to work on following a low fat diet. No chest pain, shortness of breath or paroxysmal nocturnal dyspnea.   Lab Results  Component Value Date   CHOL 214 (H) 01/05/2017   HDL 36.00 (L) 01/05/2017   LDLDIRECT 115.0 01/05/2017   TRIG 351.0 (H) 01/05/2017   CHOLHDL 6 01/05/2017    3.) Hypertension - Currently prescribed lisinopril-hctz. Reports taking the medication as prescribed and denies adverse side effects or hypotensive symptoms. Does not check his blood pressure at home and working on following a low sodium diet. Denies worst headache of life or symptoms of end organ  damage.  BP Readings from Last 3 Encounters:  06/02/17 (!) 154/94  05/14/17 (!) 172/94  03/06/17 140/90      No Known Allergies    Outpatient Medications Prior to Visit  Medication Sig Dispense Refill  . HYDROcodone-acetaminophen (NORCO/VICODIN) 5-325 MG tablet Take 2 tablets by mouth every 4 (four) hours as needed. 10 tablet 0  . ibuprofen (ADVIL,MOTRIN) 600 MG tablet Take 1 tablet (600 mg total) by mouth every 6 (six) hours as needed. 30 tablet 0  . canagliflozin (INVOKANA) 300 MG TABS tablet Take 1 tablet (300 mg total) by mouth daily before breakfast. 30 tablet 6  . glipiZIDE (GLUCOTROL XL) 10 MG 24 hr tablet Take 1 tablet (10 mg total) by mouth daily with breakfast. 90 tablet 3  . lisinopril-hydrochlorothiazide (PRINZIDE,ZESTORETIC) 20-25 MG tablet Take 1 tablet by mouth daily. 90 tablet 3  . simvastatin (ZOCOR) 20 MG tablet Take 1 tablet (20 mg total) by mouth at bedtime. 90 tablet 3   No facility-administered medications prior to visit.      Review of Systems  Constitutional: Negative for chills and fever.  Eyes:       Negative for changes in vision  Respiratory: Negative for cough, chest tightness, shortness of breath and wheezing.   Cardiovascular: Negative for chest pain, palpitations and leg swelling.  Endocrine: Negative for polydipsia, polyphagia and polyuria.  Neurological: Negative for dizziness, weakness, light-headedness and headaches.      Objective:    BP Marland Kitchen)  154/94 (BP Location: Left Arm, Patient Position: Sitting, Cuff Size: Large)   Pulse 90   Temp 98.8 F (37.1 C) (Oral)   Resp 16   Ht '5\' 9"'  (1.753 m)   Wt 180 lb (81.6 kg)   SpO2 97%   BMI 26.58 kg/m  Nursing note and vital signs reviewed.  Physical Exam  Constitutional: He is oriented to person, place, and time. He appears well-developed and well-nourished. No distress.  Cardiovascular: Normal rate, regular rhythm, normal heart sounds and intact distal pulses.   Pulmonary/Chest: Effort  normal and breath sounds normal.  Neurological: He is alert and oriented to person, place, and time.  Skin: Skin is warm and dry.  Psychiatric: He has a normal mood and affect. His behavior is normal. Judgment and thought content normal.       Assessment & Plan:   Problem List Items Addressed This Visit      Cardiovascular and Mediastinum   Uncontrolled type 2 diabetes circulatory disorder erectile dysfunction ([facility]) - Primary    Type 2 diabetes previously poorly controlled and not currently checking blood sugars at home. Had extensive discussion regarding side effects of medications and need for lowering blood sugar to reduce risk of end organ damage in the future. Obtain hemoglobin A1c. Diabetic supplies sent to pharmacy. Encouraged to monitor blood sugars at home. Continue current dosage of glipizide and will trial Invokana. Diabetic prevention is up to date. Follow up in 3 months or sooner pending A1c results.       Relevant Medications   canagliflozin (INVOKANA) 300 MG TABS tablet   glipiZIDE (GLUCOTROL XL) 10 MG 24 hr tablet   lisinopril-hydrochlorothiazide (PRINZIDE,ZESTORETIC) 20-25 MG tablet   simvastatin (ZOCOR) 20 MG tablet   Other Relevant Orders   Hemoglobin A1c   Essential hypertension    Low pressure remains labile and above goal 140/90 although significantly improved since previous. Encouraged follow low-sodium diet monitor blood pressure at home. Continue current dosage of lisinopril-hydrochlorothiazide. Consider additional medication of blood pressure remains elevated.      Relevant Medications   lisinopril-hydrochlorothiazide (PRINZIDE,ZESTORETIC) 20-25 MG tablet   simvastatin (ZOCOR) 20 MG tablet     Endocrine   Hyperlipidemia associated with type 2 diabetes mellitus ([place])    Obtain lipid profile. Appears stable with current dosage of simvastatin and no adverse side effects or myalgias. Continue current dosage of simvastatin pending lipid profile results.       Relevant Medications   canagliflozin (INVOKANA) 300 MG TABS tablet   glipiZIDE (GLUCOTROL XL) 10 MG 24 hr tablet   lisinopril-hydrochlorothiazide (PRINZIDE,ZESTORETIC) 20-25 MG tablet   simvastatin (ZOCOR) 20 MG tablet   Other Relevant Orders   Lipid Profile       I am having Levi Hodges start on glucose blood, ONE TOUCH ULTRA MINI, and ONE TOUCH SURESOFT. I am also having him maintain his HYDROcodone-acetaminophen, ibuprofen, canagliflozin, glipiZIDE, lisinopril-hydrochlorothiazide, and simvastatin.   Meds ordered this encounter  Medications  . canagliflozin (INVOKANA) 300 MG TABS tablet    Sig: Take 1 tablet (300 mg total) by mouth daily before breakfast.    Dispense:  30 tablet    Refill:  6    Order Specific Question:   Supervising Provider    Answer:   Pricilla Holm A [[id number]]  . glipiZIDE (GLUCOTROL XL) 10 MG 24 hr tablet    Sig: Take 1 tablet (10 mg total) by mouth daily with breakfast.    Dispense:  90 tablet    Refill:  3  Order Specific Question:   Supervising Provider    Answer:   Pricilla Holm A [[id number]]  . lisinopril-hydrochlorothiazide (PRINZIDE,ZESTORETIC) 20-25 MG tablet    Sig: Take 1 tablet by mouth daily.    Dispense:  90 tablet    Refill:  3    Order Specific Question:   Supervising Provider    Answer:   Pricilla Holm A [[id number]]  . simvastatin (ZOCOR) 20 MG tablet    Sig: Take 1 tablet (20 mg total) by mouth at bedtime.    Dispense:  90 tablet    Refill:  3    Order Specific Question:   Supervising Provider    Answer:   Pricilla Holm A [[id number]]  . glucose blood (ONE TOUCH ULTRA TEST) test strip    Sig: Use one strip per test. Test blood sugars 1-4 times daily as instructed.    Dispense:  100 each    Refill:  12    Substitution permissible per insurance coverage. Dx E11.9.    Order Specific Question:   Supervising Provider    Answer:   Pricilla Holm A [[id number]]  . Blood Glucose Monitoring Suppl (ONE TOUCH ULTRA MINI) w/Device KIT      Sig: Use meter to check blood sugars 1-4 times daily as instructed.    Dispense:  1 each    Refill:  0    Substitution permissible per insurance coverage. Dx E11.9.    Order Specific Question:   Supervising Provider    Answer:   Pricilla Holm A [[id number]]  . Lancets Misc. (ONE TOUCH SURESOFT) MISC    Sig: Use 1 lancet per test. Test blood sugars 1-4 times per day as instructed.    Dispense:  1 each    Refill:  1    Substitution permissible per insurance coverage. Dx E11.9.    Order Specific Question:   Supervising Provider    Answer:   Pricilla Holm A [[id number]]     Follow-up: Return in about 3 months (around 09/02/2017), or if symptoms worsen or fail to improve.  Mauricio Po, FNP

## 2017-09-01 ENCOUNTER — Ambulatory Visit (INDEPENDENT_AMBULATORY_CARE_PROVIDER_SITE_OTHER): Payer: 59 | Admitting: Internal Medicine

## 2017-09-01 ENCOUNTER — Encounter: Payer: Self-pay | Admitting: Internal Medicine

## 2017-09-01 ENCOUNTER — Other Ambulatory Visit (INDEPENDENT_AMBULATORY_CARE_PROVIDER_SITE_OTHER): Payer: 59

## 2017-09-01 VITALS — BP 130/80 | HR 73 | Temp 98.6°F | Ht 69.0 in | Wt 184.0 lb

## 2017-09-01 DIAGNOSIS — E785 Hyperlipidemia, unspecified: Secondary | ICD-10-CM

## 2017-09-01 DIAGNOSIS — I10 Essential (primary) hypertension: Secondary | ICD-10-CM

## 2017-09-01 DIAGNOSIS — N521 Erectile dysfunction due to diseases classified elsewhere: Secondary | ICD-10-CM

## 2017-09-01 DIAGNOSIS — E1165 Type 2 diabetes mellitus with hyperglycemia: Secondary | ICD-10-CM

## 2017-09-01 DIAGNOSIS — E1159 Type 2 diabetes mellitus with other circulatory complications: Secondary | ICD-10-CM

## 2017-09-01 DIAGNOSIS — E1169 Type 2 diabetes mellitus with other specified complication: Secondary | ICD-10-CM

## 2017-09-01 DIAGNOSIS — IMO0002 Reserved for concepts with insufficient information to code with codable children: Secondary | ICD-10-CM

## 2017-09-01 LAB — COMPREHENSIVE METABOLIC PANEL
ALT: 18 U/L (ref 0–53)
AST: 24 U/L (ref 0–37)
Albumin: 4.6 g/dL (ref 3.5–5.2)
Alkaline Phosphatase: 57 U/L (ref 39–117)
BUN: 13 mg/dL (ref 6–23)
CHLORIDE: 101 meq/L (ref 96–112)
CO2: 28 meq/L (ref 19–32)
CREATININE: 1.38 mg/dL (ref 0.40–1.50)
Calcium: 9.8 mg/dL (ref 8.4–10.5)
GFR: 72.61 mL/min (ref >60.00)
Glucose, Bld: 130 mg/dL — ABNORMAL HIGH (ref 70–99)
Potassium: 4.2 mEq/L (ref 3.5–5.1)
SODIUM: 138 meq/L (ref 135–145)
Total Bilirubin: 0.5 mg/dL (ref 0.2–1.2)
Total Protein: 7.5 g/dL (ref 6.0–8.3)

## 2017-09-01 LAB — LIPID PANEL
CHOL/HDL RATIO: 4
Cholesterol: 141 mg/dL (ref 0–200)
HDL: 37 mg/dL — ABNORMAL LOW (ref >39.00)
LDL CALC: 71 mg/dL (ref 0–99)
NonHDL: 104.46
Triglycerides: 168 mg/dL — ABNORMAL HIGH (ref 0.0–149.0)
VLDL: 33.6 mg/dL (ref 0.0–40.0)

## 2017-09-01 LAB — HEMOGLOBIN A1C: Hgb A1c MFr Bld: 8.5 % — ABNORMAL HIGH (ref 4.6–6.5)

## 2017-09-01 MED ORDER — GLUCOSE BLOOD VI STRP: ORAL_STRIP | 12 refills | Status: DC

## 2017-09-01 MED ORDER — FREESTYLE LIBRE 14 DAY READER DEVI: 1.0000 | 6 refills | Status: DC

## 2017-09-01 NOTE — Assessment & Plan Note (Signed)
Not well controlled, taking invokana and glipizide. Did not tolerate metformin. Checking HgA1c and CMP and lipid panel today. Adjust as needed and he knows that insulin is the next step.

## 2017-09-01 NOTE — Progress Notes (Signed)
   Subjective:    Patient ID: Levi Hodges, male    DOB: 1976-09-28, 41 y.o. 41   MRN: [id number]  HPI The patient is a 41 YO man coming in for follow up of his diabetes (taking invokana, glipizide, last HgA1c 14, he is checking sugars and they are much better with invokana), and his cholesterol (taking simvastatin daily and needs recheck, LDL <100 goal), and his blood pressure (taking his lisinopril/hctz and BP at goal).   Review of Systems  Constitutional: Negative.   HENT: Negative.   Eyes: Negative.   Respiratory: Negative for cough, chest tightness and shortness of breath.   Cardiovascular: Negative for chest pain, palpitations and leg swelling.  Gastrointestinal: Negative for abdominal distention, abdominal pain, constipation, diarrhea, nausea and vomiting.  Musculoskeletal: Negative.   Skin: Negative.   Neurological: Negative.   Psychiatric/Behavioral: Negative.       Objective:   Physical Exam  Constitutional: He is oriented to person, place, and time. He appears well-developed and well-nourished.  HENT:  Head: Normocephalic and atraumatic.  Eyes: EOM are normal.  Neck: Normal range of motion.  Cardiovascular: Normal rate and regular rhythm.   Pulmonary/Chest: Effort normal and breath sounds normal. No respiratory distress. He has no wheezes. He has no rales.  Abdominal: Soft. Bowel sounds are normal. He exhibits no distension. There is no tenderness. There is no rebound.  Musculoskeletal: He exhibits no edema.  Neurological: He is alert and oriented to person, place, and time. Coordination normal.  Skin: Skin is warm and dry.  Psychiatric: He has a normal mood and affect.   Vitals:   09/01/17 1112  BP: 130/80  Pulse: 73  Temp: 98.6 F (37 C)  TempSrc: Oral  SpO2: 100%  Weight: 184 lb (83.5 kg)  Height: 5\' 9"  (1.753 m)      Assessment & Plan:

## 2017-09-01 NOTE — Assessment & Plan Note (Signed)
Lisinopril/hctz with BP at goal. Checking CMP and adjust as needed.

## 2017-09-01 NOTE — Patient Instructions (Signed)
We are checking the labs today. We have sent in the strips today.    Diabetes Mellitus and Standards of Medical Care Managing diabetes (diabetes mellitus) can be complicated. Your diabetes treatment may be managed by a team of health care providers, including:  A diet and nutrition specialist (registered dietitian).  A nurse.  A certified diabetes educator (CDE).  A diabetes specialist (endocrinologist).  An eye doctor.  A primary care provider.  A dentist.  Your health care providers follow a schedule in order to help you get the best quality of care. The following schedule is a general guideline for your diabetes management plan. Your health care providers may also give you more specific instructions. HbA1c ( hemoglobin A1c) test This test provides information about blood sugar (glucose) control over the previous 2-3 months. It is used to check whether your diabetes management plan needs to be adjusted.  If you are meeting your treatment goals, this test is done at least 2 times a year.  If you are not meeting treatment goals or if your treatment goals have changed, this test is done 4 times a year.  Blood pressure test  This test is done at every routine medical visit. For most people, the goal is less than 130/80. Ask your health care provider what your goal blood pressure should be. Dental and eye exams  Visit your dentist two times a year.  If you have type 1 diabetes, get an eye exam 3-5 years after you are diagnosed, and then once a year after your first exam. ? If you were diagnosed with type 1 diabetes as a child, get an eye exam when you are age 74 or older and have had diabetes for 3-5 years. After the first exam, you should get an eye exam once a year.  If you have type 2 diabetes, have an eye exam as soon as you are diagnosed, and then once a year after your first exam. Foot care exam  Visual foot exams are done at every routine medical visit. The exams check  for cuts, bruises, redness, blisters, sores, or other problems with the feet.  A complete foot exam is done by your health care provider once a year. This exam includes an inspection of the structure and skin of your feet, and a check of the pulses and sensation in your feet. ? Type 1 diabetes: Get your first exam 3-5 years after diagnosis. ? Type 2 diabetes: Get your first exam as soon as you are diagnosed.  Check your feet every day for cuts, bruises, redness, blisters, or sores. If you have any of these or other problems that are not healing, contact your health care provider. Kidney function test ( urine microalbumin)  This test is done once a year. ? Type 1 diabetes: Get your first test 5 years after diagnosis. ? Type 2 diabetes: Get your first test as soon as you are diagnosed.  If you have chronic kidney disease (CKD), get a serum creatinine and estimated glomerular filtration rate (eGFR) test once a year. Lipid profile (cholesterol, HDL, LDL, triglycerides)  This test should be done when you are diagnosed with diabetes, and every 5 years after the first test. If you are on medicines to lower your cholesterol, you may need to get this test done every year. ? The goal for LDL is less than 100 mg/dL (5.5 mmol/L). If you are at high risk, the goal is less than 70 mg/dL (3.9 mmol/L). ? The goal for  HDL is 40 mg/dL (2.2 mmol/L) for men and 50 mg/dL(2.8 mmol/L) for women. An HDL cholesterol of 60 mg/dL (3.3 mmol/L) or higher gives some protection against heart disease. ? The goal for triglycerides is less than 150 mg/dL (8.3 mmol/L). Immunizations  The yearly flu (influenza) vaccine is recommended for everyone 6 months or older who has diabetes.  The pneumonia (pneumococcal) vaccine is recommended for everyone 2 years or older who has diabetes. If you are 30 or older, you may get the pneumonia vaccine as a series of two separate shots.  The hepatitis B vaccine is recommended for adults  shortly after they have been diagnosed with diabetes.  The Tdap (tetanus, diphtheria, and pertussis) vaccine should be given: ? According to normal childhood vaccination schedules, for children. ? Every 10 years, for adults who have diabetes.  The shingles vaccine is recommended for people who have had chicken pox and are 50 years or older. Mental and emotional health  Screening for symptoms of eating disorders, anxiety, and depression is recommended at the time of diagnosis and afterward as needed. If your screening shows that you have symptoms (you have a positive screening result), you may need further evaluation and be referred to a mental health care provider. Diabetes self-management education  Education about how to manage your diabetes is recommended at diagnosis and ongoing as needed. Treatment plan  Your treatment plan will be reviewed at every medical visit. Summary  Managing diabetes (diabetes mellitus) can be complicated. Your diabetes treatment may be managed by a team of health care providers.  Your health care providers follow a schedule in order to help you get the best quality of care.  Standards of care including having regular physical exams, blood tests, blood pressure monitoring, immunizations, screening tests, and education about how to manage your diabetes.  Your health care providers may also give you more specific instructions based on your individual health. This information is not intended to replace advice given to you by your health care provider. Make sure you discuss any questions you have with your health care provider. Document Released: 08/17/2009 Document Revised: 07/18/2016 Document Reviewed: 07/18/2016 Elsevier Interactive Patient Education  [employer].

## 2017-09-01 NOTE — Assessment & Plan Note (Signed)
Checking lipid panel and adjust simvastatin 20 mg daily as needed. Goal LDL <100.

## 2017-09-02 ENCOUNTER — Ambulatory Visit: Payer: 59 | Admitting: Family

## 2017-12-04 ENCOUNTER — Other Ambulatory Visit (INDEPENDENT_AMBULATORY_CARE_PROVIDER_SITE_OTHER): Payer: 59

## 2017-12-04 ENCOUNTER — Ambulatory Visit (INDEPENDENT_AMBULATORY_CARE_PROVIDER_SITE_OTHER): Payer: 59 | Admitting: Internal Medicine

## 2017-12-04 ENCOUNTER — Encounter: Payer: Self-pay | Admitting: Internal Medicine

## 2017-12-04 VITALS — BP 140/96 | HR 70 | Temp 98.6°F | Ht 69.0 in | Wt 181.0 lb

## 2017-12-04 DIAGNOSIS — E1165 Type 2 diabetes mellitus with hyperglycemia: Secondary | ICD-10-CM | POA: Diagnosis not present

## 2017-12-04 DIAGNOSIS — I10 Essential (primary) hypertension: Secondary | ICD-10-CM | POA: Diagnosis not present

## 2017-12-04 DIAGNOSIS — N521 Erectile dysfunction due to diseases classified elsewhere: Secondary | ICD-10-CM

## 2017-12-04 DIAGNOSIS — Z720 Tobacco use: Secondary | ICD-10-CM

## 2017-12-04 DIAGNOSIS — IMO0002 Reserved for concepts with insufficient information to code with codable children: Secondary | ICD-10-CM

## 2017-12-04 DIAGNOSIS — E1159 Type 2 diabetes mellitus with other circulatory complications: Secondary | ICD-10-CM

## 2017-12-04 LAB — HEMOGLOBIN A1C: HEMOGLOBIN A1C: 11.8 % — AB (ref 4.6–6.5)

## 2017-12-04 MED ORDER — AMLODIPINE BESYLATE 10 MG PO TABS: 10.0000 mg | ORAL_TABLET | Freq: Every day | ORAL | 3 refills | Status: DC

## 2017-12-04 NOTE — Assessment & Plan Note (Signed)
Time spent counseling about tobacco usage: 4 minutes. I have asked about smoking and is smoking less than usual. The patient is advised to quit. The patient is not willing to quit. They would like to try to quit in the next 6 months. We will follow up with them in 6 months. Would like to consider his options for quitting and consider making an attempt before our next visit.

## 2017-12-04 NOTE — Patient Instructions (Addendum)
We have sent in amlodipine for the blood pressure which is 1 pill daily.   We are checking the labs today and will call you back with the results.

## 2017-12-04 NOTE — Assessment & Plan Note (Signed)
BP needs titration due to CV risk 18%. With ideal control can reduce risk to 10-12% risk. Add amlodipine 10 mg daily to lisinopril/hctz today.

## 2017-12-04 NOTE — Progress Notes (Signed)
   Subjective:    Patient ID: Levi Hodges, male    DOB: January 19, 1976, 42 y.o.   MRN: [id number]  HPI The patient is a 42 YO man coming in for follow up of diabetes (still taking his glipizide and invokana, denies high sugars, sometimes in the morning sugars around 102-110, denies new numbness or weakness, still having the ED which is not changed) and blood pressure (having some headaches at home, BP better but still above goal, taking lisinopril/hctz daily, no side effects) and tobacco abuse (would like to quit, has tried cold [place] which was not successful, cutting back now which has helped a little, has not tried patches or meds for smoking cessation).   Review of Systems  Constitutional: Negative.   HENT: Negative.   Eyes: Negative.   Respiratory: Negative for cough, chest tightness and shortness of breath.   Cardiovascular: Negative for chest pain, palpitations and leg swelling.  Gastrointestinal: Negative for abdominal distention, abdominal pain, constipation, diarrhea, nausea and vomiting.  Musculoskeletal: Negative.   Skin: Negative.   Neurological: Positive for headaches. Negative for dizziness, tremors, seizures, weakness and numbness.  Psychiatric/Behavioral: Negative.       Objective:   Physical Exam  Constitutional: He is oriented to person, place, and time. He appears well-developed and well-nourished.  HENT:  Head: Normocephalic and atraumatic.  Eyes: EOM are normal.  Neck: Normal range of motion.  Cardiovascular: Normal rate and regular rhythm.  Pulmonary/Chest: Effort normal and breath sounds normal. No respiratory distress. He has no wheezes. He has no rales.  Abdominal: Soft. Bowel sounds are normal. He exhibits no distension. There is no tenderness. There is no rebound.  Musculoskeletal: He exhibits no edema.  Neurological: He is alert and oriented to person, place, and time. Coordination normal.  Skin: Skin is warm and dry.  Psychiatric: He has a normal mood and  affect.   Vitals:   12/04/17 1045  BP: (!) 140/96  Pulse: 70  Temp: 98.6 F (37 C)  TempSrc: Oral  SpO2: 100%  Weight: 181 lb (82.1 kg)  Height: 5\' 9"  (1.753 m)      Assessment & Plan:

## 2017-12-04 NOTE — Assessment & Plan Note (Signed)
Taking glipizide and invokana. Checking HgA1c today and adjust as needed. Last HgA1c above goal but much improved from prior. He is still working on diet and exercise. He may need to add actos to help with control. He has been on insulin in the past but would like to avoid if possible. Complicated by ED which is stable.

## 2018-01-14 ENCOUNTER — Encounter (HOSPITAL_COMMUNITY): Payer: Self-pay | Admitting: Emergency Medicine

## 2018-01-14 ENCOUNTER — Emergency Department (HOSPITAL_COMMUNITY)
Admission: EM | Admit: 2018-01-14 | Discharge: 2018-01-14 | Disposition: A | Discharge status: Home / Self Care | Payer: 59 | Attending: Emergency Medicine | Admitting: Emergency Medicine

## 2018-01-14 ENCOUNTER — Other Ambulatory Visit: Payer: Self-pay

## 2018-01-14 DIAGNOSIS — Z79899 Other long term (current) drug therapy: Secondary | ICD-10-CM | POA: Diagnosis not present

## 2018-01-14 DIAGNOSIS — K047 Periapical abscess without sinus: Secondary | ICD-10-CM | POA: Diagnosis not present

## 2018-01-14 DIAGNOSIS — F17210 Nicotine dependence, cigarettes, uncomplicated: Secondary | ICD-10-CM | POA: Insufficient documentation

## 2018-01-14 DIAGNOSIS — E119 Type 2 diabetes mellitus without complications: Secondary | ICD-10-CM | POA: Insufficient documentation

## 2018-01-14 DIAGNOSIS — I10 Essential (primary) hypertension: Secondary | ICD-10-CM | POA: Insufficient documentation

## 2018-01-14 DIAGNOSIS — Z7984 Long term (current) use of oral hypoglycemic drugs: Secondary | ICD-10-CM | POA: Insufficient documentation

## 2018-01-14 MED ORDER — MELOXICAM 15 MG PO TABS: 15.0000 mg | ORAL_TABLET | Freq: Every day | ORAL | 0 refills | Status: DC

## 2018-01-14 MED ORDER — AMOXICILLIN 500 MG PO CAPS: 500.0000 mg | ORAL_CAPSULE | Freq: Three times a day (TID) | ORAL | 0 refills | Status: DC

## 2018-01-14 NOTE — ED Triage Notes (Signed)
Patient complains of oral abscess in left lower mouth that started draining on spontaneously yesterday while he was at work. Patient states he plans to make a dentists appointment today but came to ED for antibiotics. Patient in no apparent distress at this time.

## 2018-01-14 NOTE — Discharge Instructions (Signed)
You have been seen by your caregiver because of dental pain.  SEEK MEDICAL ATTENTION IF: The exam and treatment you received today has been provided on an emergency basis only. This is not a substitute for complete medical or dental care. If your problem worsens or new symptoms (problems) appear, and you are unable to arrange prompt follow-up care with your dentist, call or return to this location. CALL YOUR DENTIST OR RETURN IMMEDIATELY IF you develop a fever, rash, difficulty breathing or swallowing, neck or facial swelling, or other potentially serious concerns.   [facility]  [address]  [place], [place] [address]  Phone [phone]  [facility] [place], [place], exemplifies the [employer]?s vision to improve the health and quality of life of all [place] by Regulatory affairs officer with a passion to care for the underserved and by leading the nation in community-based, service learning oral health education. We are committed to offering comprehensive general dental services for adults, children and special needs patients in a safe, caring and professional setting.  Appointments: Our clinic is open Monday through Friday 8:00 a.m. until 5:00 p.m. The amount of time scheduled for an appointment depends on the patient?s specific needs. We ask that you keep your appointed time for care or provide 24-hour notice of all appointment changes. Parents or legal guardians must accompany minor children.  Payment for Services: Medicaid and other insurance plans are welcome. Payment for services is due when services are rendered and may be made by cash or credit card. If you have dental insurance, we will assist you with your claim submission.   Emergencies: Emergency services will be provided Monday through Friday on a  walk-in basis. Please arrive early for emergency services. After hours emergency services will be provided for patients of record as required.  Services:  Comprehensive General Dentistry  Children?s Dentistry  Oral Surgery - Extractions  Root Canals  Sealants and Tooth Colored Fillings  [facility]rowns and Administrator  3-D/[facility]

## 2018-01-14 NOTE — ED Provider Notes (Signed)
[facility] EMERGENCY DEPARTMENT Provider Note   CSN: [id number] Arrival date & time: 01/14/18  0857     History   Chief Complaint Chief Complaint  Patient presents with  . Abscess    HPI Levi Hodges is a 42 y.o. male is a past medical history of poorly controlled diabetes and hypertension who presents emergency department for evaluation of a left lower dental abscess.  Patient states that he has had difficulty controlling his diabetes because he was off of medication last month due to the cost his medicines.  Patient states that his last hemoglobin A1c was greater than 11.  He developed a dental abscess over the past few days and yesterday it spontaneously opened it began to drain.  He states that it is still throbbing but the pain is greatly improved.  He denies any difficulty swallowing, fevers, chills  HPI  Past Medical History:  Diagnosis Date  . Diabetes mellitus without complication ([place])   . Hypertension     Patient Active Problem List   Diagnosis Date Noted  . Uncontrolled type 2 diabetes circulatory disorder erectile dysfunction ([place]) 01/05/2017  . Hyperlipidemia associated with type 2 diabetes mellitus ([place]) 01/05/2017  . Essential hypertension 01/05/2017  . Tobacco abuse 01/05/2017  . ED (erectile dysfunction) 01/05/2017    History reviewed. No pertinent surgical history.     Home Medications    Prior to Admission medications   Medication Sig Start Date End Date Taking? Authorizing Provider  amLODipine (NORVASC) 10 MG tablet Take 1 tablet (10 mg total) by mouth daily. 12/04/17   Hoyt Koch, MD  Blood Glucose Monitoring Suppl (ONE TOUCH ULTRA MINI) w/Device KIT Use meter to check blood sugars 1-4 times daily as instructed. 06/02/17   Golden Circle, FNP  canagliflozin (INVOKANA) 300 MG TABS tablet Take 1 tablet (300 mg total) by mouth daily before breakfast. 06/02/17   Golden Circle, FNP  glipiZIDE (GLUCOTROL XL) 10 MG 24  hr tablet Take 1 tablet (10 mg total) by mouth daily with breakfast. 06/02/17   Golden Circle, FNP  glucose blood (ACCU-CHEK ACTIVE STRIPS) test strip Use as instructed 09/01/17   Hoyt Koch, MD  glucose blood (ONE TOUCH ULTRA TEST) test strip Use one strip per test. Test blood sugars 1-4 times daily as instructed. 09/01/17   Hoyt Koch, MD  Lancets Misc. (ONE TOUCH SURESOFT) MISC Use 1 lancet per test. Test blood sugars 1-4 times per day as instructed. 06/02/17   Golden Circle, FNP  lisinopril-hydrochlorothiazide (PRINZIDE,ZESTORETIC) 20-25 MG tablet Take 1 tablet by mouth daily. 06/02/17   Golden Circle, FNP  simvastatin (ZOCOR) 20 MG tablet Take 1 tablet (20 mg total) by mouth at bedtime. 06/02/17   Golden Circle, FNP    Family History No family history on file.  Social History Social History   Tobacco Use  . Smoking status: Current Every Day Smoker  . Smokeless tobacco: Never Used  Substance Use Topics  . Alcohol use: No  . Drug use: No     Allergies   Patient has no known allergies.   Review of Systems Review of Systems Ten systems reviewed and are negative for acute change, except as noted in the HPI.    Physical Exam Updated Vital Signs BP 127/89 (BP Location: Right Arm)   Pulse 82   Temp 99.1 F (37.3 C) (Oral)   Resp 14   Ht '5\' 9"'  (1.753 m)   Wt 81.6 kg (  180 lb)   SpO2 99%   BMI 26.58 kg/m   Physical Exam  Constitutional: He appears well-developed and well-nourished. No distress.  HENT:  Head: Normocephalic and atraumatic.  Mouth/Throat: Uvula is midline.    Eyes: Conjunctivae are normal. No scleral icterus.  Neck: Normal range of motion. Neck supple.  Cardiovascular: Normal rate, regular rhythm and normal heart sounds.  Pulmonary/Chest: Effort normal and breath sounds normal. No respiratory distress.  Abdominal: Soft. There is no tenderness.  Musculoskeletal: He exhibits no edema.  Neurological: He is alert.  Skin:  Skin is warm and dry. He is not diaphoretic.  Psychiatric: His behavior is normal.  Nursing note and vitals reviewed.    ED Treatments / Results  Labs (all labs ordered are listed, but only abnormal results are displayed) Labs Reviewed  CBG MONITORING, ED    EKG  EKG Interpretation None       Radiology No results found.  Procedures Procedures (including critical care time)  Medications Ordered in ED Medications - No data to display   Initial Impression / Assessment and Plan / ED Course  I have reviewed the triage vital signs and the nursing notes.  Pertinent labs & imaging results that were available during my care of the patient were reviewed by me and considered in my medical decision making (see chart for details).     Patient with dental abscess that is open and has minimal drainage.  Exam unconcerning for Ludwig's angina or spread of infection.  Will treat with penicillin and pain medicine.  Urged patient to follow-up with dentist.     Final Clinical Impressions(s) / ED Diagnoses   Final diagnoses:  Dental abscess    ED Discharge Orders    None       Margarita Mail, PA-C 01/14/18 1738    Virgel Manifold, MD 01/15/18 [phone]

## 2018-01-24 ENCOUNTER — Other Ambulatory Visit: Payer: Self-pay | Admitting: Internal Medicine

## 2018-03-05 ENCOUNTER — Ambulatory Visit: Payer: 59 | Admitting: Internal Medicine

## 2018-05-11 ENCOUNTER — Ambulatory Visit: Payer: 59 | Admitting: Internal Medicine

## 2018-06-03 ENCOUNTER — Ambulatory Visit: Payer: 59 | Admitting: Internal Medicine

## 2018-07-14 ENCOUNTER — Emergency Department (HOSPITAL_COMMUNITY): Payer: 59

## 2018-07-14 ENCOUNTER — Encounter (HOSPITAL_COMMUNITY): Payer: Self-pay

## 2018-07-14 ENCOUNTER — Other Ambulatory Visit: Payer: Self-pay

## 2018-07-14 ENCOUNTER — Emergency Department (HOSPITAL_COMMUNITY)
Admission: EM | Admit: 2018-07-14 | Discharge: 2018-07-14 | Disposition: A | Discharge status: Home / Self Care | Payer: 59 | Attending: Emergency Medicine | Admitting: Emergency Medicine

## 2018-07-14 DIAGNOSIS — E119 Type 2 diabetes mellitus without complications: Secondary | ICD-10-CM | POA: Insufficient documentation

## 2018-07-14 DIAGNOSIS — F17210 Nicotine dependence, cigarettes, uncomplicated: Secondary | ICD-10-CM | POA: Insufficient documentation

## 2018-07-14 DIAGNOSIS — J3489 Other specified disorders of nose and nasal sinuses: Secondary | ICD-10-CM | POA: Insufficient documentation

## 2018-07-14 DIAGNOSIS — J069 Acute upper respiratory infection, unspecified: Secondary | ICD-10-CM | POA: Diagnosis not present

## 2018-07-14 DIAGNOSIS — Z7984 Long term (current) use of oral hypoglycemic drugs: Secondary | ICD-10-CM | POA: Diagnosis not present

## 2018-07-14 DIAGNOSIS — R070 Pain in throat: Secondary | ICD-10-CM | POA: Diagnosis not present

## 2018-07-14 DIAGNOSIS — B9789 Other viral agents as the cause of diseases classified elsewhere: Secondary | ICD-10-CM

## 2018-07-14 DIAGNOSIS — I10 Essential (primary) hypertension: Secondary | ICD-10-CM | POA: Insufficient documentation

## 2018-07-14 DIAGNOSIS — Z79899 Other long term (current) drug therapy: Secondary | ICD-10-CM | POA: Diagnosis not present

## 2018-07-14 DIAGNOSIS — R05 Cough: Secondary | ICD-10-CM | POA: Diagnosis not present

## 2018-07-14 MED ORDER — BENZONATATE 100 MG PO CAPS: 100.0000 mg | ORAL_CAPSULE | Freq: Three times a day (TID) | ORAL | 0 refills | Status: DC | PRN

## 2018-07-14 NOTE — Discharge Instructions (Addendum)
If you develop high fever, vomiting, trouble breathing, or any other new/concerning symptoms and return to the ER for evaluation.  Otherwise follow-up with your primary care physician and be sure to drink plenty of fluids.

## 2018-07-14 NOTE — ED Triage Notes (Signed)
Pt states that he is concerned that he either has the cold or flu. Pt states that his wife was recently dx with pneumonia. Pt states that he had a fever last night (does not have thermometer), cough and runny nose.

## 2018-07-14 NOTE — ED Provider Notes (Signed)
[facility] DEPT Provider Note   CSN: [id number] Arrival date & time: 07/14/18  4098     History   Chief Complaint Chief Complaint  Patient presents with  . URI    HPI Levi Hodges is a 42 y.o. male.  HPI  42 year old male with a history of diabetes and hypertension presents with URI symptoms. Symptoms started yesterday.  Patient's daughter and his wife have had similar illness.  Daughter was diagnosed with a virus and his wife was diagnosed with pneumonia yesterday.  The patient has had a dry cough, sore throat, rhinorrhea, and subjective fever.  Has had a slight headache as well.  No chest pain, abdominal pain, or vomiting.  He felt short of breath yesterday but none today.  He is also had diffuse body aches.  He has taken ibuprofen with good relief.  He states his glucose has been running around 200.  Past Medical History:  Diagnosis Date  . Diabetes mellitus without complication ([place])   . Hypertension     Patient Active Problem List   Diagnosis Date Noted  . Uncontrolled type 2 diabetes circulatory disorder erectile dysfunction ([place]) 01/05/2017  . Hyperlipidemia associated with type 2 diabetes mellitus ([place]) 01/05/2017  . Essential hypertension 01/05/2017  . Tobacco abuse 01/05/2017  . ED (erectile dysfunction) 01/05/2017    History reviewed. No pertinent surgical history.      Home Medications    Prior to Admission medications   Medication Sig Start Date End Date Taking? Authorizing Provider  amLODipine (NORVASC) 10 MG tablet Take 1 tablet (10 mg total) by mouth daily. 12/04/17  Yes Hoyt Koch, MD  canagliflozin [facility]) 300 MG TABS tablet Take 1 tablet (300 mg total) by mouth daily before breakfast. 06/02/17  Yes Golden Circle, FNP  glipiZIDE (GLUCOTROL XL) 10 MG 24 hr tablet Take 1 tablet (10 mg total) by mouth daily with breakfast. 06/02/17  Yes Golden Circle, FNP  ibuprofen (ADVIL,MOTRIN) 200 MG tablet Take  200 mg by mouth every 6 (six) hours as needed (For aches.).   Yes [provider]  lisinopril-hydrochlorothiazide (PRINZIDE,ZESTORETIC) 20-25 MG tablet Take 1 tablet by mouth daily. 06/02/17  Yes Golden Circle, FNP  meloxicam (MOBIC) 15 MG tablet Take 1 tablet (15 mg total) by mouth daily. 01/14/18  Yes Harris, Abigail, PA-C  simvastatin (ZOCOR) 20 MG tablet TAKE 1 TABLET(20 MG) BY MOUTH AT BEDTIME Patient taking differently: Take 20 mg by mouth at bedtime.  01/25/18  Yes Hoyt Koch, MD  benzonatate (TESSALON) 100 MG capsule Take 1 capsule (100 mg total) by mouth 3 (three) times daily as needed for cough. 07/14/18   Sherwood Gambler, MD    Family History No family history on file.  Social History Social History   Tobacco Use  . Smoking status: Current Every Day Smoker    Packs/day: 1.00  . Smokeless tobacco: Never Used  Substance Use Topics  . Alcohol use: No  . Drug use: No     Allergies   Patient has no known allergies.   Review of Systems Review of Systems  Constitutional: Positive for fever (subjective).  HENT: Positive for rhinorrhea and sore throat.   Respiratory: Positive for cough and shortness of breath.   Cardiovascular: Negative for chest pain.  Gastrointestinal: Negative for abdominal pain and vomiting.  Musculoskeletal: Positive for myalgias.  Neurological: Positive for headaches.  All other systems reviewed and are negative.    Physical Exam Updated Vital Signs BP Marland Kitchen)  142/88 (BP Location: Right Arm)   Pulse 91   Temp 98.6 F (37 C) (Oral)   Resp 18   Ht 5\' 9"  (1.753 m)   Wt 81.6 kg   SpO2 97%   BMI 26.58 kg/m   Physical Exam  Constitutional: He is oriented to person, place, and time. He appears well-developed and well-nourished. No distress.  HENT:  Head: Normocephalic and atraumatic.  Right Ear: Tympanic membrane and external ear normal.  Left Ear: Tympanic membrane and external ear normal.  Nose: Nose normal.    Mouth/Throat: No oropharyngeal exudate.  Eyes: Right eye exhibits no discharge. Left eye exhibits no discharge.  Neck: Neck supple.  Cardiovascular: Normal rate, regular rhythm and normal heart sounds.  Pulmonary/Chest: Effort normal and breath sounds normal. He has no wheezes. He has no rales.  Abdominal: Soft. There is no tenderness.  Musculoskeletal: He exhibits no edema.  Neurological: He is alert and oriented to person, place, and time.  Skin: Skin is warm and dry. He is not diaphoretic.  Nursing note and vitals reviewed.    ED Treatments / Results  Labs (all labs ordered are listed, but only abnormal results are displayed) Labs Reviewed - No data to display  EKG None  Radiology Dg Chest 2 View  Result Date: 07/14/2018 CLINICAL DATA:  Fever and cough. EXAM: CHEST - 2 VIEW COMPARISON:  None. FINDINGS: The heart size and mediastinal contours are within normal limits. Both lungs are clear. The visualized skeletal structures are unremarkable. IMPRESSION: Normal exam. Electronically Signed   By: Lorriane Shire M.D.   On: 07/14/2018 09:10    Procedures Procedures (including critical care time)  Medications Ordered in ED Medications - No data to display   Initial Impression / Assessment and Plan / ED Course  I have reviewed the triage vital signs and the nursing notes.  Pertinent labs & imaging results that were available during my care of the patient were reviewed by me and considered in my medical decision making (see chart for details).     Patient appears to have a viral URI.  His vital signs are unremarkable besides mild hypertension.  He otherwise appears well.  He was given encouraged to use ibuprofen and Tylenol for his myalgias and will prescribe Tessalon for his cough.  Otherwise he does not appear to have a bacterial infection/pneumonia.  Discharged home with return precautions.  Final Clinical Impressions(s) / ED Diagnoses   Final diagnoses:  Viral upper  respiratory tract infection with cough    ED Discharge Orders         Ordered    benzonatate (TESSALON) 100 MG capsule  3 times daily PRN     07/14/18 6629           Sherwood Gambler, MD 07/14/18 [phone]

## 2018-08-27 ENCOUNTER — Other Ambulatory Visit (INDEPENDENT_AMBULATORY_CARE_PROVIDER_SITE_OTHER): Payer: 59

## 2018-08-27 ENCOUNTER — Encounter: Payer: Self-pay | Admitting: Internal Medicine

## 2018-08-27 ENCOUNTER — Ambulatory Visit (INDEPENDENT_AMBULATORY_CARE_PROVIDER_SITE_OTHER): Payer: 59 | Admitting: Internal Medicine

## 2018-08-27 VITALS — BP 150/90 | HR 79 | Temp 98.1°F | Ht 69.0 in | Wt 177.0 lb

## 2018-08-27 DIAGNOSIS — E1159 Type 2 diabetes mellitus with other circulatory complications: Secondary | ICD-10-CM | POA: Diagnosis not present

## 2018-08-27 DIAGNOSIS — I10 Essential (primary) hypertension: Secondary | ICD-10-CM | POA: Diagnosis not present

## 2018-08-27 DIAGNOSIS — N521 Erectile dysfunction due to diseases classified elsewhere: Secondary | ICD-10-CM

## 2018-08-27 DIAGNOSIS — E1165 Type 2 diabetes mellitus with hyperglycemia: Secondary | ICD-10-CM

## 2018-08-27 DIAGNOSIS — E1169 Type 2 diabetes mellitus with other specified complication: Secondary | ICD-10-CM

## 2018-08-27 DIAGNOSIS — IMO0002 Reserved for concepts with insufficient information to code with codable children: Secondary | ICD-10-CM

## 2018-08-27 DIAGNOSIS — Z91120 Patient's intentional underdosing of medication regimen due to financial hardship: Secondary | ICD-10-CM | POA: Insufficient documentation

## 2018-08-27 DIAGNOSIS — E785 Hyperlipidemia, unspecified: Secondary | ICD-10-CM

## 2018-08-27 LAB — LIPID PANEL
CHOLESTEROL: 170 mg/dL (ref 0–200)
HDL: 36 mg/dL — AB (ref >39.00)
LDL CALC: 102 mg/dL — AB (ref 0–99)
NonHDL: 134.45
TRIGLYCERIDES: 162 mg/dL — AB (ref 0.0–149.0)
Total CHOL/HDL Ratio: 5
VLDL: 32.4 mg/dL (ref 0.0–40.0)

## 2018-08-27 LAB — COMPREHENSIVE METABOLIC PANEL
ALBUMIN: 4.2 g/dL (ref 3.5–5.2)
ALT: 14 U/L (ref 0–53)
AST: 16 U/L (ref 0–37)
Alkaline Phosphatase: 103 U/L (ref 39–117)
BUN: 16 mg/dL (ref 6–23)
CALCIUM: 9.4 mg/dL (ref 8.4–10.5)
CHLORIDE: 102 meq/L (ref 96–112)
CO2: 25 mEq/L (ref 19–32)
Creatinine, Ser: 1.52 mg/dL — ABNORMAL HIGH (ref 0.40–1.50)
GFR: 64.65 mL/min (ref >60.00)
Glucose, Bld: 320 mg/dL — ABNORMAL HIGH (ref 70–99)
POTASSIUM: 4 meq/L (ref 3.5–5.1)
SODIUM: 135 meq/L (ref 135–145)
Total Bilirubin: 0.5 mg/dL (ref 0.2–1.2)
Total Protein: 7.5 g/dL (ref 6.0–8.3)

## 2018-08-27 LAB — CBC
HEMATOCRIT: 43.5 % (ref 39.0–52.0)
Hemoglobin: 14.7 g/dL (ref 13.0–17.0)
MCHC: 33.9 g/dL (ref 30.0–36.0)
MCV: 81.7 fl (ref 78.0–100.0)
PLATELETS: 320 10*3/uL (ref 150.0–400.0)
RBC: 5.32 Mil/uL (ref 4.22–5.81)
RDW: 13.7 % (ref 11.5–15.5)
WBC: 6.3 10*3/uL (ref 4.0–10.5)

## 2018-08-27 LAB — MICROALBUMIN / CREATININE URINE RATIO
Creatinine,U: 126.5 mg/dL
Microalb Creat Ratio: 123.4 mg/g — ABNORMAL HIGH (ref 0.0–30.0)
Microalb, Ur: 156.1 mg/dL — ABNORMAL HIGH (ref 0.0–1.9)

## 2018-08-27 LAB — POCT GLYCOSYLATED HEMOGLOBIN (HGB A1C): Hemoglobin A1C: 14.9 % — AB (ref 4.0–5.6)

## 2018-08-27 MED ORDER — METFORMIN HCL ER 500 MG PO TB24: 500.0000 mg | ORAL_TABLET | Freq: Every day | ORAL | 3 refills | Status: DC

## 2018-08-27 MED ORDER — ACARBOSE 100 MG PO TABS: 100.0000 mg | ORAL_TABLET | Freq: Three times a day (TID) | ORAL | 6 refills | Status: DC

## 2018-08-27 NOTE — Assessment & Plan Note (Signed)
BP close to goal but not taking meds consistently. He is asked to be more diligent about amlodipine and lisinopril/hctz. Checking CMP for progression of CKD 2.

## 2018-08-27 NOTE — Assessment & Plan Note (Signed)
Not taking consistently or some meds not at all due to financial burden of medications leading to harm of his medical conditions and poor control overall of all his medical conditions.

## 2018-08-27 NOTE — Patient Instructions (Signed)
You need to take glipizide daily.   We have sent in metformin to take 1 pill daily.   We have sent in acarbose to take 1 pill 3 times a day.   You need to come back in 3 months to check the sugars. We are checking the labs today for kidneys to see if there has been any more damage from uncontrolled diabetes and blood pressure.   Make sure to take the blood pressure medicine daily.

## 2018-08-27 NOTE — Progress Notes (Signed)
   Subjective:    Patient ID: Levi Hodges, male    DOB: August 07, 1976, 42 y.o.   MRN: [id number]  HPI The patient is a 42 YO man coming in for poorly controlled diabetes. His last HgA1c was 11.8 and he was urged to make regimen changes but declined that wanted to wait for visit but did not return for 8 months. He does have complications of diabetes already including ED and CKD. He is not taking medications regularly now due to cost. He is taking glipizide some of the time and nothing else. Sometimes taking his blood pressure medicines. He denies new numbness or weakness. He is peeing a lot and thirsty a lot and losing weight (10 pounds in the last year). He denies chest pains or SOB or cough. Denies diarrhea but some constipation. He did try metformin in the past and got leg cramps with it. Denies diarrhea with it.   Review of Systems  Constitutional: Positive for unexpected weight change. Negative for activity change, appetite change, chills, fatigue and fever.  HENT: Negative.   Eyes: Negative.   Respiratory: Negative for cough, chest tightness and shortness of breath.   Cardiovascular: Negative for chest pain, palpitations and leg swelling.  Gastrointestinal: Positive for abdominal pain and constipation. Negative for abdominal distention, diarrhea, nausea and vomiting.  Endocrine: Positive for polydipsia and polyuria.  Musculoskeletal: Positive for back pain.  Skin: Negative.   Neurological: Negative.   Psychiatric/Behavioral: Negative.       Objective:   Physical Exam  Constitutional: He is oriented to person, place, and time. He appears well-developed and well-nourished.  HENT:  Head: Normocephalic and atraumatic.  Eyes: EOM are normal.  Neck: Normal range of motion.  Cardiovascular: Normal rate and regular rhythm.  Pulmonary/Chest: Effort normal and breath sounds normal. No respiratory distress. He has no wheezes. He has no rales.  Abdominal: Soft. Bowel sounds are normal. He  exhibits no distension. There is no tenderness. There is no rebound.  Musculoskeletal: He exhibits no edema.  Neurological: He is alert and oriented to person, place, and time. Coordination normal.  Skin: Skin is warm and dry.  Psychiatric: He has a normal mood and affect.   Vitals:   08/27/18 0857  BP: (!) 150/90  Pulse: 79  Temp: 98.1 F (36.7 C)  TempSrc: Oral  SpO2: 98%  Weight: 177 lb (80.3 kg)  Height: 5\' 9"  (1.753 m)      Assessment & Plan:  Visit time 25 minutes: greater than 50% of that time was spent in face to face counseling and coordination of care with the patient: counseled about poorly controlled diabetes, complications of diabetes, his CKD already, importance of taking medications and formulating plan which he can afford

## 2018-08-27 NOTE — Assessment & Plan Note (Addendum)
Severe exacerbation with worsening control and noncompliance with meds. Checking microalbumin to creatinine ratio. Taking glipizide only sometimes lately. Will remove invokana as he cannot afford. Rx for metformin and acarbose to help address his HgA1c 14.9. He states cannot afford insulin. Needs close monitoring as he is already having some complications for diabetes. Taking statin sometimes and ACE-I sometimes and he is strongly encouraged to take regularly.

## 2018-08-27 NOTE — Assessment & Plan Note (Signed)
Checking lipid panel and adjust simvastatin 20 mg daily as needed. Taking fairly regularly per patient.

## 2018-12-03 ENCOUNTER — Ambulatory Visit: Payer: 59 | Admitting: Internal Medicine

## 2019-03-11 ENCOUNTER — Other Ambulatory Visit (INDEPENDENT_AMBULATORY_CARE_PROVIDER_SITE_OTHER): Payer: 59

## 2019-03-11 ENCOUNTER — Encounter: Payer: Self-pay | Admitting: Internal Medicine

## 2019-03-11 ENCOUNTER — Other Ambulatory Visit: Payer: Self-pay | Admitting: Internal Medicine

## 2019-03-11 ENCOUNTER — Ambulatory Visit (INDEPENDENT_AMBULATORY_CARE_PROVIDER_SITE_OTHER): Payer: 59 | Admitting: Internal Medicine

## 2019-03-11 DIAGNOSIS — IMO0002 Reserved for concepts with insufficient information to code with codable children: Secondary | ICD-10-CM

## 2019-03-11 DIAGNOSIS — M545 Low back pain, unspecified: Secondary | ICD-10-CM | POA: Insufficient documentation

## 2019-03-11 DIAGNOSIS — E1122 Type 2 diabetes mellitus with diabetic chronic kidney disease: Secondary | ICD-10-CM | POA: Diagnosis not present

## 2019-03-11 DIAGNOSIS — E1159 Type 2 diabetes mellitus with other circulatory complications: Secondary | ICD-10-CM | POA: Diagnosis not present

## 2019-03-11 DIAGNOSIS — N183 Chronic kidney disease, stage 3 unspecified: Secondary | ICD-10-CM

## 2019-03-11 DIAGNOSIS — N182 Chronic kidney disease, stage 2 (mild): Secondary | ICD-10-CM

## 2019-03-11 DIAGNOSIS — E1165 Type 2 diabetes mellitus with hyperglycemia: Secondary | ICD-10-CM

## 2019-03-11 DIAGNOSIS — N521 Erectile dysfunction due to diseases classified elsewhere: Secondary | ICD-10-CM

## 2019-03-11 LAB — CBC
HCT: 44 % (ref 39.0–52.0)
Hemoglobin: 14.6 g/dL (ref 13.0–17.0)
MCHC: 33.3 g/dL (ref 30.0–36.0)
MCV: 81.9 fl (ref 78.0–100.0)
Platelets: 308 10*3/uL (ref 150.0–400.0)
RBC: 5.37 Mil/uL (ref 4.22–5.81)
RDW: 13.7 % (ref 11.5–15.5)
WBC: 8.1 10*3/uL (ref 4.0–10.5)

## 2019-03-11 LAB — COMPREHENSIVE METABOLIC PANEL
ALT: 14 U/L (ref 0–53)
AST: 25 U/L (ref 0–37)
Albumin: 3.8 g/dL (ref 3.5–5.2)
Alkaline Phosphatase: 136 U/L — ABNORMAL HIGH (ref 39–117)
BUN: 16 mg/dL (ref 6–23)
CO2: 25 mEq/L (ref 19–32)
Calcium: 8.6 mg/dL (ref 8.4–10.5)
Chloride: 98 mEq/L (ref 96–112)
Creatinine, Ser: 1.87 mg/dL — ABNORMAL HIGH (ref 0.40–1.50)
GFR: 47.77 mL/min — ABNORMAL LOW (ref >60.00)
Glucose, Bld: 495 mg/dL — ABNORMAL HIGH (ref 70–99)
Potassium: 4.5 mEq/L (ref 3.5–5.1)
Sodium: 131 mEq/L — ABNORMAL LOW (ref 135–145)
Total Bilirubin: 0.3 mg/dL (ref 0.2–1.2)
Total Protein: 7.2 g/dL (ref 6.0–8.3)

## 2019-03-11 LAB — HEMOGLOBIN A1C: Hgb A1c MFr Bld: 14.6 % — ABNORMAL HIGH (ref 4.6–6.5)

## 2019-03-11 MED ORDER — INSULIN GLARGINE 100 UNIT/ML SOLOSTAR PEN: 10.0000 [IU] | PEN_INJECTOR | Freq: Every day | SUBCUTANEOUS | 99 refills | Status: DC

## 2019-03-11 MED ORDER — TIZANIDINE HCL 2 MG PO TABS: 2.0000 mg | ORAL_TABLET | Freq: Three times a day (TID) | ORAL | 0 refills | Status: DC | PRN

## 2019-03-11 NOTE — Progress Notes (Signed)
Virtual Visit via Video Note  I connected with Levi Hodges on 03/11/19 at  8:40 AM EDT by a video enabled telemedicine application and verified that I am speaking with the correct person using two identifiers.  The patient and the provider were at separate locations throughout the entire encounter.   I discussed the limitations of evaluation and management by telemedicine and the availability of in person appointments. The patient expressed understanding and agreed to proceed.  History of Present Illness: The patient is a 43 y.o. man with visit for several concerns including follow up diabetes (stopped taking acarbose due to GI issues and gas, not taking metformin at this time, taking glipizide, not checking sugars currently, denies new numbness or weakness), ckd stage 2 (some concerns about worsening as his diabetes is uncontrolled, denies headaches or chest pains, taking BP medication faithfully) and worsening low back pain (rare before, now he is having this more consistently, work has been busy although he does have decreased hours due to pandemic, denies taking anything for it except heating pad).  Observations/Objective: Appearance: normal, breathing appears normal, casual grooming, abdomen does not appear distended, throat normal, memory normal, mental status is A and O times 3  Assessment and Plan: See problem oriented charting  Follow Up Instructions: labs, adjust diabetes treatment as needed   I discussed the assessment and treatment plan with the patient. The patient was provided an opportunity to ask questions and all were answered. The patient agreed with the plan and demonstrated an understanding of the instructions.   The patient was advised to call back or seek an in-person evaluation if the symptoms worsen or if the condition fails to improve as anticipated.  Hoyt Koch, MD

## 2019-03-11 NOTE — Assessment & Plan Note (Signed)
With worsening at last labs. Needs BMP today and adjust diabetes regimen. Previous BP at goal and no change in regimen. Needs in person visit with vitals if worsening function.

## 2019-03-11 NOTE — Assessment & Plan Note (Signed)
Likely with poor control as he is not taking his medications at this time. Needs CMP and HgA1c. His creatinine is borderline for metformin so needs labs before resuming this. Taking ACE-I. Has been severely exacerbated for some time without much improvement.

## 2019-03-11 NOTE — Assessment & Plan Note (Signed)
Rx for tizanidine and avoid NSAIDs. Can use tylenol for pain also if needed or heating pad.

## 2019-03-21 ENCOUNTER — Encounter: Payer: Self-pay | Admitting: Internal Medicine

## 2019-03-21 ENCOUNTER — Ambulatory Visit (INDEPENDENT_AMBULATORY_CARE_PROVIDER_SITE_OTHER): Payer: 59 | Admitting: Internal Medicine

## 2019-03-21 ENCOUNTER — Other Ambulatory Visit: Payer: Self-pay

## 2019-03-21 VITALS — Ht 69.0 in | Wt 183.0 lb

## 2019-03-21 DIAGNOSIS — E119 Type 2 diabetes mellitus without complications: Secondary | ICD-10-CM | POA: Insufficient documentation

## 2019-03-21 DIAGNOSIS — E1165 Type 2 diabetes mellitus with hyperglycemia: Secondary | ICD-10-CM

## 2019-03-21 DIAGNOSIS — E1129 Type 2 diabetes mellitus with other diabetic kidney complication: Secondary | ICD-10-CM | POA: Diagnosis not present

## 2019-03-21 DIAGNOSIS — R809 Proteinuria, unspecified: Secondary | ICD-10-CM | POA: Diagnosis not present

## 2019-03-21 DIAGNOSIS — E118 Type 2 diabetes mellitus with unspecified complications: Secondary | ICD-10-CM | POA: Insufficient documentation

## 2019-03-21 MED ORDER — GLUCOSE BLOOD VI STRP: ORAL_STRIP | 12 refills | Status: DC

## 2019-03-21 MED ORDER — ONETOUCH VERIO FLEX SYSTEM W/DEVICE KIT: 1.0000 | PACK | 0 refills | Status: DC

## 2019-03-21 MED ORDER — INSULIN LISPRO (1 UNIT DIAL) 100 UNIT/ML (KWIKPEN): 7.0000 [IU] | PEN_INJECTOR | Freq: Three times a day (TID) | SUBCUTANEOUS | 6 refills | Status: DC

## 2019-03-21 MED ORDER — BASAGLAR KWIKPEN 100 UNIT/ML ~~LOC~~ SOPN: 20.0000 [IU] | PEN_INJECTOR | Freq: Every day | SUBCUTANEOUS | 6 refills | Status: DC

## 2019-03-21 NOTE — Progress Notes (Signed)
Virtual Visit via Video Note  I connected with Levi Hodges on 03/21/19 at  1:40 PM EDT by a video enabled telemedicine application and verified that I am speaking with the correct person using two identifiers.   I discussed the limitations of evaluation and management by telemedicine and the availability of in person appointments. The patient expressed understanding and agreed to proceed.  -Location of the patient : Car  -Location of the provider : office  -The names of all persons participating in the telemedicine service : Pt and myself     Name: Levi Hodges  MRN/ DOB: [id number], Jan 06, 1976   Age/ Sex: 43 y.o., male    PCP: Hoyt Koch, MD   Reason for Endocrinology Evaluation: Type 2 Diabetes Mellitus     Date of Initial Endocrinology Visit: 03/21/2019     PATIENT IDENTIFIER: [name] is a 43 y.o. male with a past medical history of HTN, T2DM, Dyslipidemia . The patient presented for initial endocrinology clinic visit on 03/21/2019 for consultative assistance with his diabetes management.    HPI: [name] was    Diagnosed with T2DM since 2010 Prior Medications tried/Intolerance: invokana, farxiga, jardiance,  Currently checking blood sugars 0x / day. Hypoglycemia episodes : 0              Hemoglobin A1c has ranged from 8.5% in 2018, peaking at 14.6% in 2020. Patient required assistance for hypoglycemia: no Patient has required hospitalization within the last 1 year from hyper or hypoglycemia: no   In terms of diet, the patient drinks regular sodas, and juice. He eats 2 meals a day and snacks (cake)   HOME DIABETES REGIMEN: Lantus - not covered , has not taken  Glipizide 10 mg - not taking   Statin: yes ACE-I/ARB: yes Prior Diabetic Education: yes  GLUCOSE LOG: n/a    DIABETIC COMPLICATIONS: Microvascular complications:   Nephropathy   Denies: neuropathy , retinopathy   Last eye exam: Completed 2018  Macrovascular  complications:    Denies: CAD, PVD, CVA   PAST HISTORY: Past Medical History:  Past Medical History:  Diagnosis Date  . Diabetes mellitus without complication ([place])   . Hypertension     Past Surgical History: No past surgical history on file.   Social History:  reports that he has been smoking. He has been smoking about 1.00 pack per day. He has never used smokeless tobacco. He reports that he does not drink alcohol or use drugs.  Family History: No family history on file.   HOME MEDICATIONS: Allergies as of 03/21/2019   No Known Allergies     Medication List       Accurate as of Mar 21, 2019 12:13 PM. If you have any questions, ask your nurse or doctor.        amLODipine 10 MG tablet Commonly known as:  NORVASC TAKE 1 TABLET(10 MG) BY MOUTH DAILY   benzonatate 100 MG capsule Commonly known as:  TESSALON Take 1 capsule (100 mg total) by mouth 3 (three) times daily as needed for cough.   glipiZIDE 10 MG 24 hr tablet Commonly known as:  GLUCOTROL XL Take 1 tablet (10 mg total) by mouth daily with breakfast.   Insulin Glargine 100 UNIT/ML Solostar Pen Commonly known as:  Lantus SoloStar Inject 10-30 Units into the skin at bedtime.   lisinopril-hydrochlorothiazide 20-25 MG tablet Commonly known as:  ZESTORETIC Take 1 tablet by mouth daily.   simvastatin 20 MG tablet Commonly known as:  ZOCOR TAKE 1 TABLET(20 MG) BY MOUTH AT BEDTIME   tiZANidine 2 MG tablet Commonly known as:  ZANAFLEX Take 1 tablet (2 mg total) by mouth every 8 (eight) hours as needed for muscle spasms.        ALLERGIES: No Known Allergies   REVIEW OF SYSTEMS: A comprehensive ROS was conducted with the patient and is negative except as per HPI and below:  ROS      DATA REVIEWED:  Lab Results  Component Value Date   HGBA1C 14.6 (H) 03/11/2019   HGBA1C 14.9 (A) 08/27/2018   HGBA1C 11.8 (H) 12/04/2017   Lab Results  Component Value Date   MICROALBUR 156.1 (H) 08/27/2018    LDLCALC 102 (H) 08/27/2018   CREATININE 1.87 (H) 03/11/2019   Lab Results  Component Value Date   MICRALBCREAT 123.4 (H) 08/27/2018    Lab Results  Component Value Date   CHOL 170 08/27/2018   HDL 36.00 (L) 08/27/2018   LDLCALC 102 (H) 08/27/2018   LDLDIRECT 115.0 01/05/2017   TRIG 162.0 (H) 08/27/2018   CHOLHDL 5 08/27/2018        ASSESSMENT / PLAN / RECOMMENDATIONS:   1) Type 2 Diabetes Mellitus, Poorly controlled, With nephropathic complications - Most recent A1c of 14.6 %. Goal A1c < 7.0 %.   Plan: GENERAL:  Poorly controlled diabetes due to medication non-adherence and dietary indiscretions.  I have discussed with the patient the pathophysiology of diabetes. We went over the natural progression of the disease. We talked about both insulin resistance and insulin deficiency. We stressed the importance of lifestyle changes including diet and exercise. I explained the complications associated with diabetes including retinopathy, nephropathy, neuropathy as well as increased risk of cardiovascular disease. We went over the benefit seen with glycemic control.    I explained to the patient that diabetic patients are at higher than normal risk for amputations. Discussed pharmacokinetics of basal/bolus insulin and the importance of taking prandial insulin with meals.   We also discussed avoiding sugar-sweetened beverages and snacks, when possible.   We also discussed the importance of glucose checks and having those readings available   His barriers may be the cost of insulin, I have advised him to contact me if he is having issues, we may consider putting him on NPH and regular   MEDICATIONS:  Basaglar 20 units daily   Humalog 7 units TID QAC   EDUCATION / INSTRUCTIONS:  BG monitoring instructions: Patient is instructed to check his blood sugars 4 times a day, before meals and bedtime .  Call [facility] if: BG persistently < 70 or > 300. . I reviewed the  Rule of 15 for the treatment of hypoglycemia in detail with the patient. Literature supplied.   2) Diabetic complications:   Eye: Does not have known diabetic retinopathy.   Neuro/ Feet: Does not have known diabetic peripheral neuropathy.  Renal: Patient does not have known baseline CKD, despite having a low GFR on last visit, will repeat in 3 months prior to diagnosing him with CKD. He is on an ACEI/ARB at present. Check urine albumin/creatinine ratio yearly starting at time of diagnosis. If albuminuria is positive, treatment is geared toward better glucose, blood pressure control and use of ACE inhibitors or ARBs. Monitor electrolytes and creatinine once to twice yearly.   3) Lipids: Patient is on a statin.    4) Hypertension:  Historically he has been above goal of < 140/90 mmHg due to non-compliance  I discussed the assessment and treatment plan with the patient. The patient was provided an opportunity to ask questions and all were answered. The patient agreed with the plan and demonstrated an understanding of the instructions.   The patient was advised to call back or seek an in-person evaluation if the symptoms worsen or if the condition fails to improve as anticipated.  F/u in 6 weeks  Signed electronically by: Mack Guise, MD  [facility]  [facility] [address] [place],  [address] Phone: [phone] FAX: [phone]   CC: Hoyt Koch, [place] [address] Phone: [phone] Fax: [phone]   Return to Endocrinology clinic as below: Future Appointments  Date Time Provider [place]  03/21/2019  1:40 PM , Melanie Crazier, MD [facility] None

## 2019-03-22 ENCOUNTER — Telehealth: Payer: Self-pay | Admitting: Internal Medicine

## 2019-03-22 ENCOUNTER — Other Ambulatory Visit: Payer: Self-pay

## 2019-03-22 NOTE — Telephone Encounter (Signed)
Patient states that he can not afford 'Lespro" but can afford Humalog because it is covered with insurance.  Please Advise, Thanks

## 2019-03-22 NOTE — Telephone Encounter (Signed)
Patient confirms that this is affordable.

## 2019-03-22 NOTE — Telephone Encounter (Signed)
lft vm for pt informing him of change and instructions to call back letting us know if the change is affordable for him

## 2019-03-22 NOTE — Telephone Encounter (Signed)
Please advise 

## 2019-03-23 NOTE — Telephone Encounter (Signed)
Can you please send new rx?

## 2019-03-24 ENCOUNTER — Encounter: Payer: Self-pay | Admitting: Internal Medicine

## 2019-03-25 MED ORDER — LISINOPRIL-HYDROCHLOROTHIAZIDE 20-25 MG PO TABS: 1.0000 | ORAL_TABLET | Freq: Every day | ORAL | 1 refills | Status: DC

## 2019-03-25 MED ORDER — SIMVASTATIN 20 MG PO TABS: ORAL_TABLET | ORAL | 1 refills | Status: DC

## 2019-03-25 MED ORDER — AMLODIPINE BESYLATE 10 MG PO TABS: ORAL_TABLET | ORAL | 1 refills | Status: DC

## 2019-04-15 ENCOUNTER — Other Ambulatory Visit: Payer: Self-pay

## 2019-04-15 ENCOUNTER — Encounter: Payer: Self-pay | Admitting: Internal Medicine

## 2019-04-15 MED ORDER — PEN NEEDLES 31G X 5 MM MISC: 1.0000 | Freq: Every day | 3 refills | Status: AC

## 2019-04-20 ENCOUNTER — Other Ambulatory Visit: Payer: Self-pay | Admitting: Nephrology

## 2019-04-20 DIAGNOSIS — N183 Chronic kidney disease, stage 3 unspecified: Secondary | ICD-10-CM

## 2019-04-29 ENCOUNTER — Ambulatory Visit
Admission: RE | Admit: 2019-04-29 | Discharge: 2019-04-29 | Disposition: A | Discharge status: Home / Self Care | Payer: 59 | Source: Ambulatory Visit | Attending: Nephrology | Admitting: Nephrology

## 2019-04-29 DIAGNOSIS — N183 Chronic kidney disease, stage 3 unspecified: Secondary | ICD-10-CM

## 2019-05-10 ENCOUNTER — Other Ambulatory Visit (HOSPITAL_COMMUNITY): Payer: Self-pay | Admitting: Nephrology

## 2019-05-10 DIAGNOSIS — R319 Hematuria, unspecified: Secondary | ICD-10-CM

## 2019-05-10 DIAGNOSIS — N183 Chronic kidney disease, stage 3 unspecified: Secondary | ICD-10-CM

## 2019-05-10 DIAGNOSIS — R809 Proteinuria, unspecified: Secondary | ICD-10-CM

## 2019-05-22 ENCOUNTER — Other Ambulatory Visit: Payer: Self-pay | Admitting: Radiology

## 2019-05-23 ENCOUNTER — Other Ambulatory Visit: Payer: Self-pay | Admitting: Student

## 2019-05-24 ENCOUNTER — Ambulatory Visit (HOSPITAL_COMMUNITY)
Admission: RE | Admit: 2019-05-24 | Discharge: 2019-05-24 | Disposition: A | Discharge status: Home / Self Care | Payer: 59 | Source: Ambulatory Visit | Attending: Nephrology | Admitting: Nephrology

## 2019-05-24 ENCOUNTER — Other Ambulatory Visit: Payer: Self-pay

## 2019-05-24 ENCOUNTER — Encounter (HOSPITAL_COMMUNITY): Payer: Self-pay

## 2019-05-24 DIAGNOSIS — I129 Hypertensive chronic kidney disease with stage 1 through stage 4 chronic kidney disease, or unspecified chronic kidney disease: Secondary | ICD-10-CM | POA: Diagnosis not present

## 2019-05-24 DIAGNOSIS — R809 Proteinuria, unspecified: Secondary | ICD-10-CM | POA: Diagnosis present

## 2019-05-24 DIAGNOSIS — E1122 Type 2 diabetes mellitus with diabetic chronic kidney disease: Secondary | ICD-10-CM | POA: Diagnosis not present

## 2019-05-24 DIAGNOSIS — Z794 Long term (current) use of insulin: Secondary | ICD-10-CM | POA: Insufficient documentation

## 2019-05-24 DIAGNOSIS — R319 Hematuria, unspecified: Secondary | ICD-10-CM | POA: Diagnosis not present

## 2019-05-24 DIAGNOSIS — N183 Chronic kidney disease, stage 3 unspecified: Secondary | ICD-10-CM

## 2019-05-24 DIAGNOSIS — E1121 Type 2 diabetes mellitus with diabetic nephropathy: Secondary | ICD-10-CM | POA: Insufficient documentation

## 2019-05-24 DIAGNOSIS — F17210 Nicotine dependence, cigarettes, uncomplicated: Secondary | ICD-10-CM | POA: Diagnosis not present

## 2019-05-24 DIAGNOSIS — Z833 Family history of diabetes mellitus: Secondary | ICD-10-CM | POA: Diagnosis not present

## 2019-05-24 DIAGNOSIS — Z79899 Other long term (current) drug therapy: Secondary | ICD-10-CM | POA: Diagnosis not present

## 2019-05-24 LAB — CBC
HCT: 39.7 % (ref 39.0–52.0)
Hemoglobin: 13.2 g/dL (ref 13.0–17.0)
MCH: 28 pg (ref 26.0–34.0)
MCHC: 33.2 g/dL (ref 30.0–36.0)
MCV: 84.1 fL (ref 80.0–100.0)
Platelets: 312 10*3/uL (ref 150–400)
RBC: 4.72 MIL/uL (ref 4.22–5.81)
RDW: 13.6 % (ref 11.5–15.5)
WBC: 7.9 10*3/uL (ref 4.0–10.5)
nRBC: 0 % (ref 0.0–0.2)

## 2019-05-24 LAB — GLUCOSE, CAPILLARY: Glucose-Capillary: 152 mg/dL — ABNORMAL HIGH (ref 70–99)

## 2019-05-24 LAB — PROTIME-INR
INR: 1.1 (ref 0.8–1.2)
Prothrombin Time: 13.7 seconds (ref 11.4–15.2)

## 2019-05-24 MED ORDER — HYDROCODONE-ACETAMINOPHEN 5-325 MG PO TABS: 1.0000 | ORAL_TABLET | ORAL | Status: DC | PRN

## 2019-05-24 MED ORDER — SODIUM CHLORIDE 0.9 % IV SOLN: INTRAVENOUS | Status: DC

## 2019-05-24 MED ORDER — HYDRALAZINE HCL 20 MG/ML IJ SOLN
INTRAMUSCULAR | Status: AC
  Filled 2019-05-24: qty 1

## 2019-05-24 MED ORDER — FENTANYL CITRATE (PF) 100 MCG/2ML IJ SOLN
INTRAMUSCULAR | Status: AC | PRN
  Administered 2019-05-24 (×2): 25 ug via INTRAVENOUS
  Administered 2019-05-24: 50 ug via INTRAVENOUS

## 2019-05-24 MED ORDER — MIDAZOLAM HCL 2 MG/2ML IJ SOLN
INTRAMUSCULAR | Status: AC | PRN
  Administered 2019-05-24: 2 mg via INTRAVENOUS
  Administered 2019-05-24: 0.5 mg via INTRAVENOUS

## 2019-05-24 MED ORDER — FENTANYL CITRATE (PF) 100 MCG/2ML IJ SOLN
INTRAMUSCULAR | Status: AC
  Filled 2019-05-24: qty 2

## 2019-05-24 MED ORDER — HYDRALAZINE HCL 20 MG/ML IJ SOLN
INTRAMUSCULAR | Status: AC | PRN
  Administered 2019-05-24: 10 mg via INTRAVENOUS

## 2019-05-24 MED ORDER — MIDAZOLAM HCL 2 MG/2ML IJ SOLN
INTRAMUSCULAR | Status: AC
  Filled 2019-05-24: qty 2

## 2019-05-24 MED ORDER — SODIUM CHLORIDE 0.9 % IV SOLN
INTRAVENOUS | Status: AC | PRN
  Administered 2019-05-24: 10 mL/h via INTRAVENOUS

## 2019-05-24 MED ORDER — GELATIN ABSORBABLE 12-7 MM EX MISC
CUTANEOUS | Status: AC
  Filled 2019-05-24: qty 1

## 2019-05-24 MED ORDER — LIDOCAINE HCL (PF) 1 % IJ SOLN
INTRAMUSCULAR | Status: AC
  Filled 2019-05-24: qty 30

## 2019-05-24 NOTE — Procedures (Signed)
Interventional Radiology Procedure Note  Procedure: Random renal biopsy, lower pole RIGHT kidney  Complications: None immediate  Estimated Blood Loss: None  Recommendations: - Bedrest x 4 hrs - Clears first 2 hrs, then regular diet   Signed,  Criselda Peaches, MD

## 2019-05-24 NOTE — H&P (Signed)
Chief Complaint: Patient was seen in consultation today for random renal biopsy at Levi request of Levi Hodges C  Referring Physician(s): Levi Hodges  Supervising Physician: Jacqulynn Cadet  Patient Status: [name] - Out-pt  History of Present Illness: Levi Hodges is a 43 y.o. male   CKD 3 Follows with Dr Royce Macadamia Hx HTN-- some noncompliance Hx Not controlled DM  Persistent proteinuria; hematuria Request for random renal biopsy  Past Medical History:  Diagnosis Date  . Diabetes mellitus without complication ([place])   . Hypertension     History reviewed. No pertinent surgical history.  Allergies: Patient has no known allergies.  Medications: Prior to Admission medications   Medication Sig Start Date End Date Taking? Authorizing Provider  amLODipine (NORVASC) 10 MG tablet TAKE 1 TABLET(10 MG) BY MOUTH DAILY 03/25/19  Yes Hoyt Koch, MD  cholecalciferol (VITAMIN D3) 25 MCG (1000 UT) tablet Take 1,000 Units by mouth daily.   Yes [provider]  cloNIDine (CATAPRES) 0.1 MG tablet Take 0.1 mg by mouth 2 (two) times daily.   Yes [provider]  Insulin Glargine (BASAGLAR KWIKPEN) 100 UNIT/ML SOPN Inject 0.2 mLs (20 Units total) into Levi skin daily. Patient taking differently: Inject 20 Units into Levi skin at bedtime.  03/21/19  Yes [name], Melanie Crazier, MD  insulin lispro (HUMALOG KWIKPEN) 100 UNIT/ML KwikPen Inject 0.07 mLs (7 Units total) into Levi skin 3 (three) times daily. 03/21/19  Yes [name], Melanie Crazier, MD  lisinopril (ZESTRIL) 20 MG tablet Take 20 mg by mouth daily.   Yes [provider]  metoprolol tartrate (LOPRESSOR) 25 MG tablet Take 25 mg by mouth 2 (two) times daily.   Yes [provider]  simvastatin (ZOCOR) 20 MG tablet TAKE 1 TABLET(20 MG) BY MOUTH AT BEDTIME 03/25/19  Yes Hoyt Koch, MD  Blood Glucose Monitoring Suppl (Coin) w/Device KIT 1 Device by Does not apply  route as directed. 03/21/19   [name], Melanie Crazier, MD  glucose blood (ONETOUCH VERIO) test strip 4x daily 03/21/19   [name], Melanie Crazier, MD  Insulin Pen Needle (PEN NEEDLES) 31G X 5 MM MISC 1 Package by Does not apply route daily. 04/15/19   [name], Melanie Crazier, MD     Family History  Problem Relation Age of Onset  . Diabetes Maternal Grandfather     Social History   Socioeconomic History  . Marital status: Married    Spouse name: Not on file  . Number of children: Not on file  . Years of education: Not on file  . Highest education level: Not on file  Occupational History  . Not on file  Social Needs  . Financial resource strain: Not on file  . Food insecurity    Worry: Not on file    Inability: Not on file  . Transportation needs    Medical: Not on file    Non-medical: Not on file  Tobacco Use  . Smoking status: Current Every Day Smoker    Packs/day: 1.00  . Smokeless tobacco: Never Used  Substance and Sexual Activity  . Alcohol use: No  . Drug use: No  . Sexual activity: Yes  Lifestyle  . Physical activity    Days per week: Not on file    Minutes per session: Not on file  . Stress: Not on file  Relationships  . Social Herbalist on phone: Not on file    Gets together: Not on file    Attends  religious service: Not on file    Active member of club or organization: Not on file    Attends meetings of clubs or organizations: Not on file    Relationship status: Not on file  Other Topics Concern  . Not on file  Social History Narrative  . Not on file    Review of Systems: A 12 point ROS discussed and pertinent positives are indicated in Levi HPI above.  All other systems are negative.  Review of Systems  Constitutional: Negative for activity change, fatigue and fever.  Respiratory: Negative for cough and shortness of breath.   Cardiovascular: Negative for chest pain.  Gastrointestinal: Negative for abdominal pain.  Neurological:  Negative for weakness.  Psychiatric/Behavioral: Negative for behavioral problems and confusion.    Vital Signs: BP (!) 144/88   Pulse 65   Temp 97.9 F (36.6 C) (Oral)   Resp 16   Ht '5\' 11"'  (1.803 m)   Wt 185 lb (83.9 kg)   SpO2 100%   BMI 25.80 kg/m   Physical Exam Vitals signs reviewed.  Cardiovascular:     Rate and Rhythm: Normal rate and regular rhythm.     Heart sounds: Normal heart sounds.  Pulmonary:     Effort: Pulmonary effort is normal.     Breath sounds: Normal breath sounds.  Abdominal:     Palpations: Abdomen is soft.  Musculoskeletal: Normal range of motion.  Skin:    General: Skin is warm and dry.  Neurological:     Mental Status: He is alert and oriented to person, place, and time.  Psychiatric:        Mood and Affect: Mood normal.        Behavior: Behavior normal.        Thought Content: Thought content normal.        Judgment: Judgment normal.     Imaging: US Renal  Result Date: 04/29/2019 CLINICAL DATA:  Initial evaluation for chronic kidney disease, stage III EXAM: RENAL / URINARY TRACT ULTRASOUND COMPLETE COMPARISON:  None. FINDINGS: Right Kidney: Renal measurements: 10.9 x 4.3 x 5.4 cm = volume: 135.5 mL. Diffusely increased echogenicity within Levi renal parenchyma. No discrete mass lesion. No nephrolithiasis or hydronephrosis. Left Kidney: Renal measurements: 10.5 x 6 x 3 x 5.7 cm = volume: 188.5 mL. Diffusely increased echogenicity within Levi renal parenchyma. No discrete mass lesion. No nephrolithiasis or hydronephrosis. Bladder: Appears normal for degree of bladder distention. IMPRESSION: 1. Diffusely increased echogenicity within Levi renal parenchyma, compatible with medical renal disease. 2. No hydronephrosis, discrete renal mass, or other acute finding. Electronically Signed   By: Jeannine Boga M.D.   On: 04/29/2019 15:49    Labs:  CBC: Recent Labs    08/27/18 0944 03/11/19 0935 05/24/19 0613  WBC 6.3 8.1 7.9  HGB 14.7 14.6  13.2  HCT 43.5 44.0 39.7  PLT 320.0 308.0 312    COAGS: No results for input(s): INR, APTT in Levi last 8760 hours.  BMP: Recent Labs    08/27/18 0944 03/11/19 0935  NA 135 131*  K 4.0 4.5  CL 102 98  CO2 25 25  GLUCOSE 320* 495*  BUN 16 16  CALCIUM 9.4 8.6  CREATININE 1.52* 1.87*    LIVER FUNCTION TESTS: Recent Labs    08/27/18 0944 03/11/19 0935  BILITOT 0.5 0.3  AST 16 25  ALT 14 14  ALKPHOS 103 136*  PROT 7.5 7.2  ALBUMIN 4.2 3.8    TUMOR MARKERS: No results for  input(s): AFPTM, CEA, CA199, CHROMGRNA in Levi last 8760 hours.  Assessment and Plan:  CKD 3 Persisent proteinuria and hematuria Scheduled now for random renal biopsy Risks and benefits of random renal biopsy was discussed with Levi patient and/or patient's family including, but not limited to bleeding, infection, damage to adjacent structures or low yield requiring additional tests.  All of Levi questions were answered and there is agreement to proceed. Consent signed and in chart.   Thank you for this interesting consult.  I greatly enjoyed meeting Levi Hodges and look forward to participating in their care.  A copy of this report was sent to Levi requesting provider on this date.  Electronically Signed: Lavonia Drafts, PA-C 05/24/2019, 7:07 AM   I spent a total of  30 Minutes   in face to face in clinical consultation, greater than 50% of which was counseling/coordinating care for random renal biopsy

## 2019-05-24 NOTE — Discharge Instructions (Signed)
Return to Work ______[name]_____________________________________________ was treated at our facility. Injury or illness was: ___Work-related. _X__Not work-related. ___Undetermined if work-related. Return to work  Employee may return to work on ___7/27/20___________________.  Employee may return to modified work on ______________________. Work activity restrictions This person is not able to do the following activities: ___Bend ___Sit for a prolonged time  This person should not sit for more than ____ hours at a time.  This person should not sit for more than ____ hours during an 8-hour workday. _X__Lift more than ___5____ lb ___Squat ___ Stand for a prolonged time  ___ This person should not stand for more than ____ hours at a time.  ___ This person should not stand for more than ____ hours during an 8-hour workday. ___Climb ___Reach ___Push and pull with the ___ right hand ___ left hand ___ Walk  ___ This person should not walk for more than ____ hours at a time.  ___ This person should not walk for more than ____ hours during an 8-hour workday. ___ Drive or operate a motor vehicle at work ___ [employer] with the ___ right hand ___ left hand ___Other _________________________________________________________________ These restrictions are effective until ______________________ or until a recheck appointment on ______________________. Health care provider name (printed): _________________________________________ Health care provider (signature): _________________________________________ Date: _________________________________________ How to use this form Show this Return to Work statement to your supervisor at work as soon as possible. Your employer should be aware of your condition and may be able to help with the necessary work activity restrictions. Contact your health care provider if:  You wish to return to work sooner than the date that is listed above.  You  have problems that make it difficult for you to return at that time. This information is not intended to replace advice given to you by your health care provider. Make sure you discuss any questions you have with your health care provider. Document Released: 10/20/2005 Document Revised: 10/15/2017 Document Reviewed: 10/15/2017 Elsevier Patient Education  [place]. Percutaneous Kidney Biopsy, Care After This sheet gives you information about how to care for yourself after your procedure. Your health care provider may also give you more specific instructions. If you have problems or questions, contact your health care provider. What can I expect after the procedure? After the procedure, it is common to have:  Pain or soreness near the area where the needle went through your skin (biopsy site).  Bright pink or cloudy urine for 24 hours after the procedure. Follow these instructions at home: Activity  Return to your normal activities as told by your health care provider. Ask your health care provider what activities are safe for you.  Do not drive for 24 hours if you were given a medicine to help you relax (sedative).  Do not lift anything that is heavier than 10 lb (4.5 kg) until your health care provider tells you that it is safe.  Avoid activities that take a lot of effort (are strenuous) until your health care provider approves. Most people will have to wait 2 weeks before returning to activities such as exercise or sexual intercourse. General instructions   Take over-the-counter and prescription medicines only as told by your health care provider.  You may eat and drink after your procedure. Follow instructions from your health care provider about eating or drinking restrictions.  Check your biopsy site every day for signs of infection. Check for: ? More redness, swelling, or pain. ? More fluid or blood. ?  Warmth. ? Pus or a bad smell.  Keep all follow-up visits as told  by your health care provider. This is important. Contact a health care provider if:  You have more redness, swelling, or pain around your biopsy site.  You have more fluid or blood coming from your biopsy site.  Your biopsy site feels warm to the touch.  You have pus or a bad smell coming from your biopsy site.  You have blood in your urine more than 24 hours after your procedure. Get help right away if:  You have dark red or brown urine.  You have a fever.  You are unable to urinate.  You feel burning when you urinate.  You feel faint.  You have severe pain in your abdomen or side. This information is not intended to replace advice given to you by your health care provider. Make sure you discuss any questions you have with your health care provider. Document Released: 06/22/2013 Document Revised: 10/02/2017 Document Reviewed: 08/01/2016 Elsevier Patient Education  [place].  Moderate Conscious Sedation, Adult, Care After These instructions provide you with information about caring for yourself after your procedure. Your health care provider may also give you more specific instructions. Your treatment has been planned according to current medical practices, but problems sometimes occur. Call your health care provider if you have any problems or questions after your procedure. What can I expect after the procedure? After your procedure, it is common:  To feel sleepy for several hours.  To feel clumsy and have poor balance for several hours.  To have poor judgment for several hours.  To vomit if you eat too soon. Follow these instructions at home: For at least 24 hours after the procedure:   Do not: ? Participate in activities where you could fall or become injured. ? Drive. ? Use heavy machinery. ? Drink alcohol. ? Take sleeping pills or medicines that cause drowsiness. ? Make important decisions or sign legal documents. ? Take care of children on your  own.  Rest. Eating and drinking  Follow the diet recommended by your health care provider.  If you vomit: ? Drink water, juice, or soup when you can drink without vomiting. ? Make sure you have little or no nausea before eating solid foods. General instructions  Have a responsible adult stay with you until you are awake and alert.  Take over-the-counter and prescription medicines only as told by your health care provider.  If you smoke, do not smoke without supervision.  Keep all follow-up visits as told by your health care provider. This is important. Contact a health care provider if:  You keep feeling nauseous or you keep vomiting.  You feel light-headed.  You develop a rash.  You have a fever. Get help right away if:  You have trouble breathing. This information is not intended to replace advice given to you by your health care provider. Make sure you discuss any questions you have with your health care provider. Document Released: 08/10/2013 Document Revised: 10/02/2017 Document Reviewed: 02/09/2016 Elsevier Patient Education  2020 [employer].

## 2019-05-26 ENCOUNTER — Encounter: Payer: Self-pay | Admitting: Internal Medicine

## 2019-05-27 ENCOUNTER — Other Ambulatory Visit: Payer: Self-pay

## 2019-05-27 MED ORDER — LANTUS SOLOSTAR 100 UNIT/ML ~~LOC~~ SOPN: 20.0000 [IU] | PEN_INJECTOR | Freq: Every day | SUBCUTANEOUS | 11 refills | Status: DC

## 2019-05-27 MED ORDER — INSULIN GLARGINE 100 UNITS/ML SOLOSTAR PEN: 20.0000 [IU] | PEN_INJECTOR | Freq: Every day | SUBCUTANEOUS | 11 refills | Status: DC

## 2019-05-31 ENCOUNTER — Encounter (HOSPITAL_COMMUNITY): Payer: Self-pay

## 2019-09-21 ENCOUNTER — Encounter: Payer: Self-pay | Admitting: Internal Medicine

## 2019-09-21 ENCOUNTER — Ambulatory Visit (INDEPENDENT_AMBULATORY_CARE_PROVIDER_SITE_OTHER): Payer: 59 | Admitting: Internal Medicine

## 2019-09-21 ENCOUNTER — Other Ambulatory Visit: Payer: Self-pay

## 2019-09-21 ENCOUNTER — Other Ambulatory Visit (INDEPENDENT_AMBULATORY_CARE_PROVIDER_SITE_OTHER): Payer: 59

## 2019-09-21 VITALS — BP 142/100 | HR 73 | Temp 98.0°F | Ht 71.0 in | Wt 188.0 lb

## 2019-09-21 DIAGNOSIS — E1165 Type 2 diabetes mellitus with hyperglycemia: Secondary | ICD-10-CM

## 2019-09-21 DIAGNOSIS — I10 Essential (primary) hypertension: Secondary | ICD-10-CM | POA: Diagnosis not present

## 2019-09-21 DIAGNOSIS — N183 Chronic kidney disease, stage 3 unspecified: Secondary | ICD-10-CM | POA: Diagnosis not present

## 2019-09-21 DIAGNOSIS — E118 Type 2 diabetes mellitus with unspecified complications: Secondary | ICD-10-CM

## 2019-09-21 DIAGNOSIS — E1122 Type 2 diabetes mellitus with diabetic chronic kidney disease: Secondary | ICD-10-CM | POA: Diagnosis not present

## 2019-09-21 LAB — RENAL FUNCTION PANEL
Albumin: 3.8 g/dL (ref 3.5–5.2)
BUN: 16 mg/dL (ref 6–23)
CO2: 25 mEq/L (ref 19–32)
Calcium: 8.8 mg/dL (ref 8.4–10.5)
Chloride: 105 mEq/L (ref 96–112)
Creatinine, Ser: 1.82 mg/dL — ABNORMAL HIGH (ref 0.40–1.50)
GFR: 49.16 mL/min — ABNORMAL LOW (ref >60.00)
Glucose, Bld: 192 mg/dL — ABNORMAL HIGH (ref 70–99)
Phosphorus: 2.6 mg/dL (ref 2.3–4.6)
Potassium: 4.5 mEq/L (ref 3.5–5.1)
Sodium: 138 mEq/L (ref 135–145)

## 2019-09-21 LAB — POCT GLYCOSYLATED HEMOGLOBIN (HGB A1C): Hemoglobin A1C: 7 % — AB (ref 4.0–5.6)

## 2019-09-21 MED ORDER — METOPROLOL TARTRATE 25 MG PO TABS: 25.0000 mg | ORAL_TABLET | Freq: Two times a day (BID) | ORAL | 3 refills | Status: DC

## 2019-09-21 MED ORDER — AMLODIPINE BESYLATE 10 MG PO TABS: ORAL_TABLET | ORAL | 3 refills | Status: DC

## 2019-09-21 MED ORDER — SIMVASTATIN 20 MG PO TABS: ORAL_TABLET | ORAL | 3 refills | Status: DC

## 2019-09-21 MED ORDER — LANTUS SOLOSTAR 100 UNIT/ML ~~LOC~~ SOPN: 20.0000 [IU] | PEN_INJECTOR | Freq: Every day | SUBCUTANEOUS | 11 refills | Status: DC

## 2019-09-21 MED ORDER — LISINOPRIL 20 MG PO TABS: 20.0000 mg | ORAL_TABLET | Freq: Every day | ORAL | 3 refills | Status: DC

## 2019-09-21 MED ORDER — INSULIN LISPRO (1 UNIT DIAL) 100 UNIT/ML (KWIKPEN): 7.0000 [IU] | PEN_INJECTOR | Freq: Three times a day (TID) | SUBCUTANEOUS | 6 refills | Status: DC

## 2019-09-21 NOTE — Patient Instructions (Addendum)
We have sent in the medicines for you.   Good job on the diabetes make sure to keep up the good work!

## 2019-09-21 NOTE — Progress Notes (Signed)
   Subjective:   Patient ID: Levi Hodges, male    DOB: 02/02/1976, 43 y.o.   MRN: [id number]  HPI The patient is a 43 YO man coming in for follow up of diabetes (seen endo since our last visit and started lantus and humalog, denies low sugars except rare at night, has adjusted down his night time humalog to help depending on sugar before dinner, trying to eat better and monitoring his medications more closely, denies new problems) and CKD stage 3 (seen nephrology and had biopsy with changes from blood pressure and diabetes, taking his medications currently, they stopped hctz due to concern for overdiuresis) and hypertension (BP normal at home, up today and has not taken meds this morning yet due to coming here, denies headaches or chest pains, denies side effects).   Review of Systems  Constitutional: Negative.   HENT: Negative.   Eyes: Negative.   Respiratory: Negative for cough, chest tightness and shortness of breath.   Cardiovascular: Negative for chest pain, palpitations and leg swelling.  Gastrointestinal: Negative for abdominal distention, abdominal pain, constipation, diarrhea, nausea and vomiting.  Musculoskeletal: Negative.   Skin: Negative.   Neurological: Negative.   Psychiatric/Behavioral: Negative.     Objective:  Physical Exam Constitutional:      Appearance: He is well-developed.  HENT:     Head: Normocephalic and atraumatic.  Neck:     Musculoskeletal: Normal range of motion.  Cardiovascular:     Rate and Rhythm: Normal rate and regular rhythm.  Pulmonary:     Effort: Pulmonary effort is normal. No respiratory distress.     Breath sounds: Normal breath sounds. No wheezing or rales.  Abdominal:     General: Bowel sounds are normal. There is no distension.     Palpations: Abdomen is soft.     Tenderness: There is no abdominal tenderness. There is no rebound.  Skin:    General: Skin is warm and dry.  Neurological:     Mental Status: He is alert and oriented to  person, place, and time.     Coordination: Coordination normal.     Vitals:   09/21/19 0840 09/21/19 0913  BP: (!) 160/100 (!) 142/100  Pulse: 73   Temp: 98 F (36.7 C)   TempSrc: Oral   SpO2: 99%   Weight: 188 lb (85.3 kg)   Height: 5\' 11"  (1.803 m)     Assessment & Plan:

## 2019-09-22 NOTE — Assessment & Plan Note (Signed)
Renal biopsy with DM changes. Reminded about the importance of good DM and HTN control.

## 2019-09-22 NOTE — Assessment & Plan Note (Signed)
With insulin now. POC HgA1c today 7.0 which is much better than prior. He has been taking meds appropriately and will continue. Advised if there are problems with medication cost let us know so we can help.

## 2019-09-22 NOTE — Assessment & Plan Note (Signed)
BP elevated today and he will check at home since it is running well. Taking lisinopril and metoprolol and amlodipine and clonidine. Checking renal function panel.

## 2020-02-14 IMAGING — US ULTRASOUND CORE BIOPSY
1 series · 11 of 11 positions shown · non-contrast
Comparison: Renal ultrasound 04/29/2019

INDICATION: 42-year-old male with stage 3 chronic kidney disease, hematuria and
proteinuria. He presents for random renal biopsy.

EXAM:
ULTRASOUND GUIDED RENAL BIOPSY

[Series 1: ultrasound core biopsy · 11 of 11 slices shown]
[im 1/11]
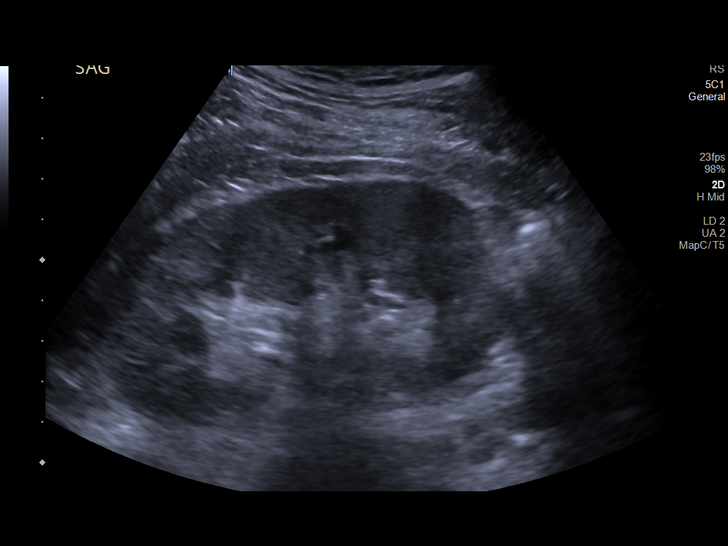
[im 2/11]
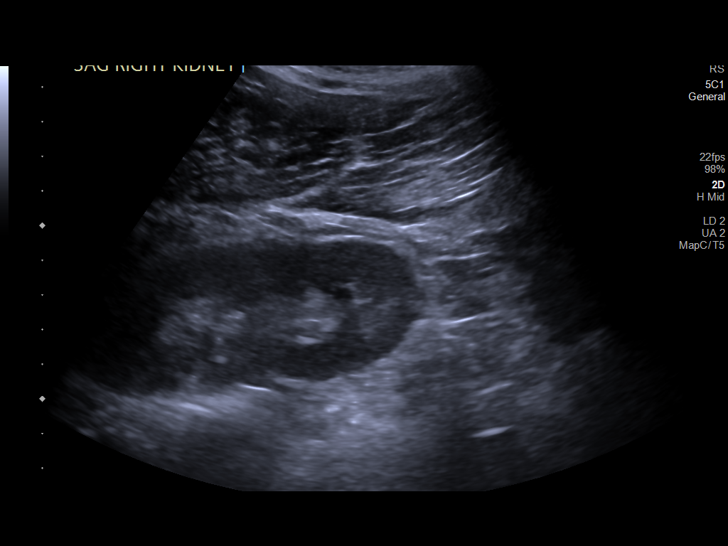
[im 3/11]
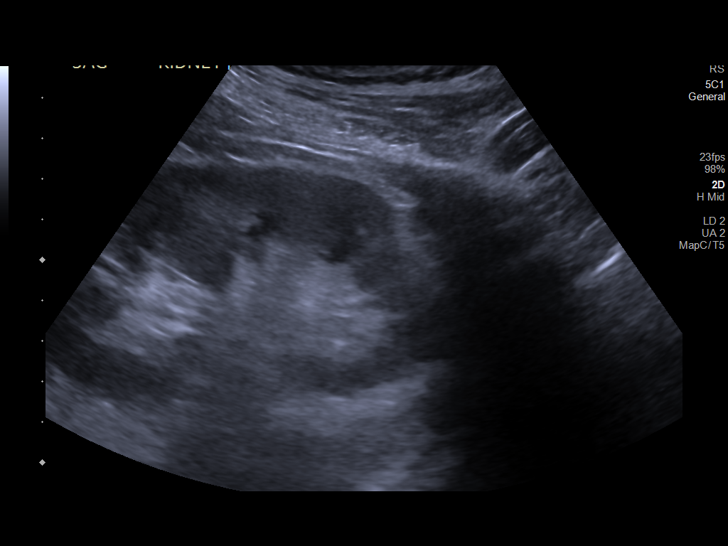
[im 4/11]
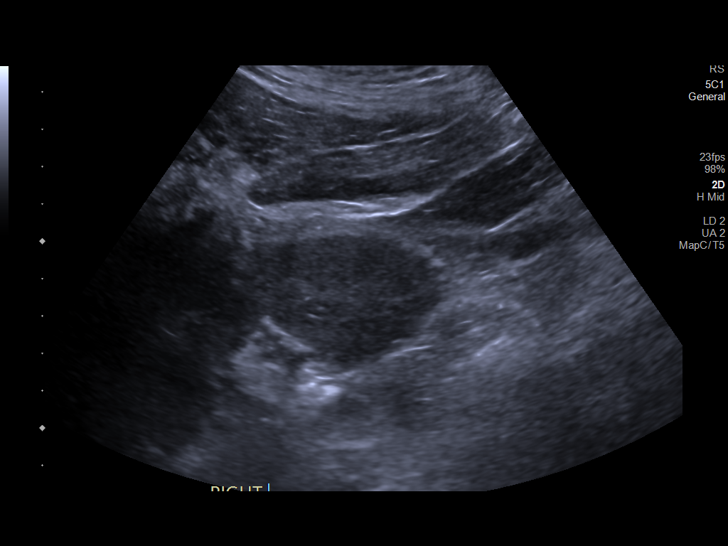
[im 5/11]
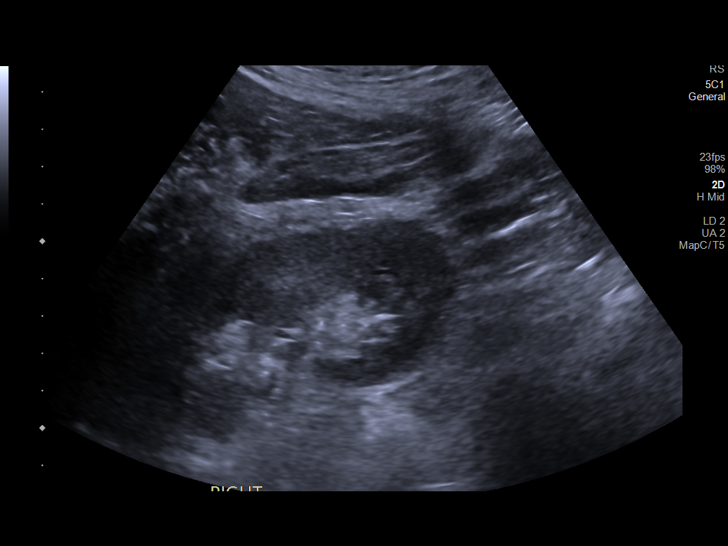
[im 6/11]
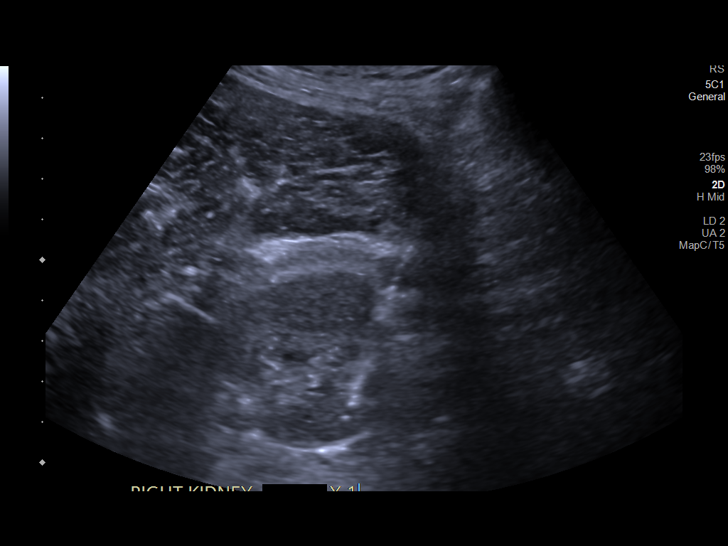
[im 7/11]
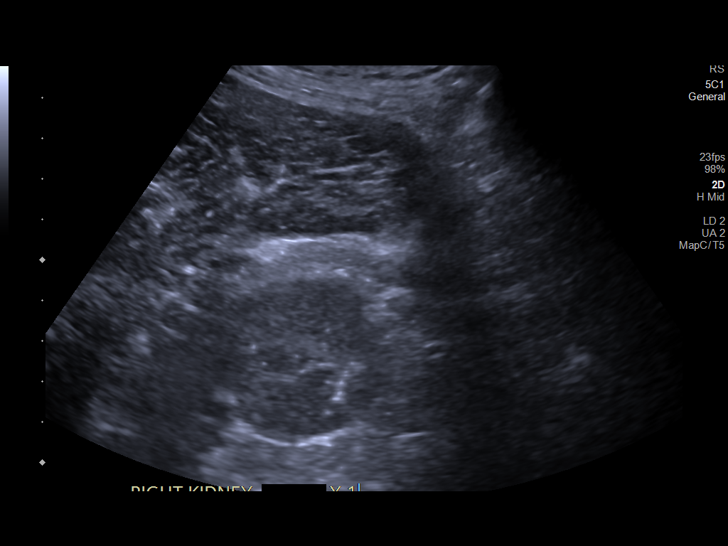
[im 8/11]
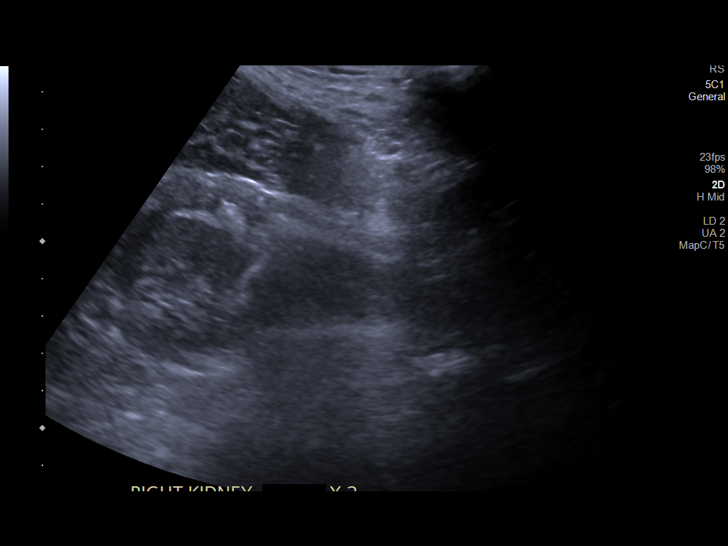
[im 9/11]
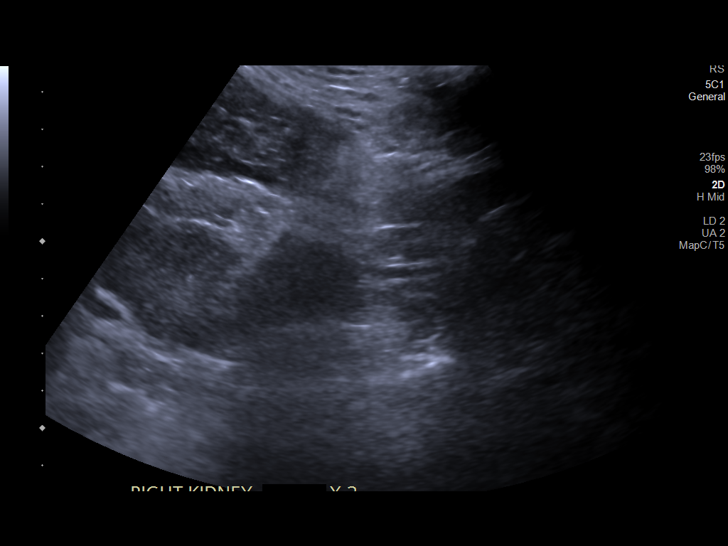
[im 10/11]
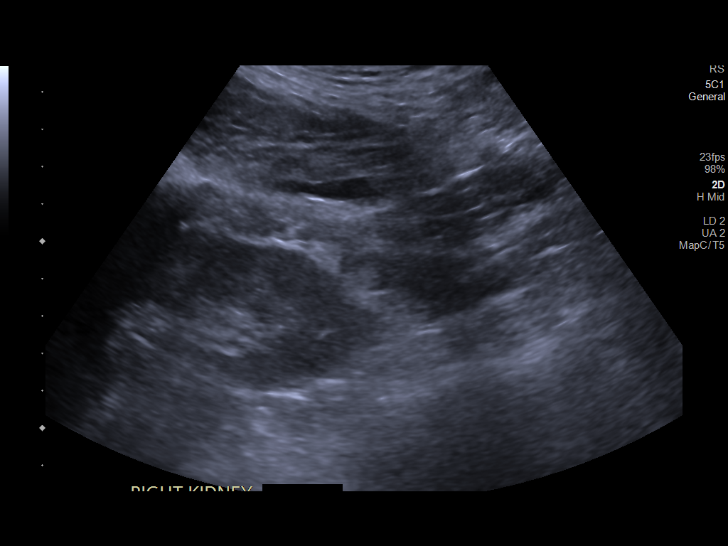
[im 11/11]
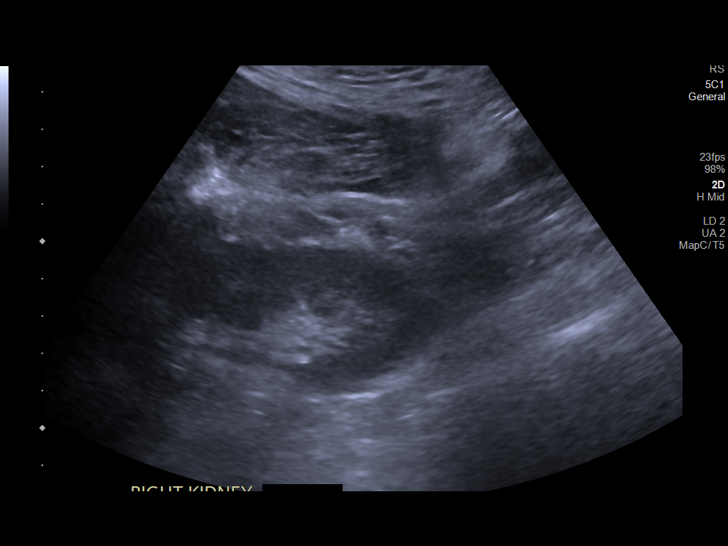

[11 of 11 positions shown; findings below may reference images not displayed]

MEDICATIONS:
Fentanyl 100 mcg IV; Versed 2.5 mg IV

ANESTHESIA/SEDATION:
The patient's vital signs and level of consciousness were monitored
continuously by radiology nursing throughout the procedure under my
direct supervision.

Total Moderate Sedation time

17 minutes

COMPLICATIONS:
None immediate

PROCEDURE:
Informed written consent was obtained from the patient after a
discussion of the risks, benefits and alternatives to treatment. The
patient understands and consents the procedure. A timeout was
performed prior to the initiation of the procedure.

Ultrasound scanning was performed of the bilateral flanks. The
inferior pole of the right kidney was selected for biopsy due to
location and sonographic window. The procedure was planned. The
operative site was prepped and draped in the usual sterile fashion.
The overlying soft tissues were anesthetized with 1% lidocaine with
epinephrine. A 16 gauge core needle biopsy device was advanced into
the inferior cortex of the right kidney and 2 core biopsies were
obtained under direct ultrasound guidance. Images were saved for
documentation purposes. The biopsy device was removed and hemostasis
was obtained with manual compression. Post procedural scanning was
negative for significant post procedural hemorrhage or additional
complication. A dressing was placed. The patient tolerated the
procedure well without immediate post procedural complication.
IMPRESSION: Technically successful ultrasound guided right renal biopsy.

## 2020-11-08 ENCOUNTER — Other Ambulatory Visit: Payer: Self-pay | Admitting: Internal Medicine

## 2020-12-07 ENCOUNTER — Other Ambulatory Visit: Payer: Self-pay | Admitting: Internal Medicine

## 2021-02-22 ENCOUNTER — Other Ambulatory Visit: Payer: Self-pay | Admitting: Internal Medicine

## 2021-02-22 DIAGNOSIS — E118 Type 2 diabetes mellitus with unspecified complications: Secondary | ICD-10-CM

## 2021-02-27 ENCOUNTER — Other Ambulatory Visit: Payer: Self-pay

## 2021-02-27 ENCOUNTER — Other Ambulatory Visit (INDEPENDENT_AMBULATORY_CARE_PROVIDER_SITE_OTHER): Payer: Self-pay

## 2021-02-27 DIAGNOSIS — E118 Type 2 diabetes mellitus with unspecified complications: Secondary | ICD-10-CM

## 2021-02-27 LAB — HEMOGLOBIN A1C: Hgb A1c MFr Bld: 6.1 % (ref 4.6–6.5)

## 2021-02-27 LAB — BASIC METABOLIC PANEL
BUN: 27 mg/dL — ABNORMAL HIGH (ref 6–23)
CO2: 22 mEq/L (ref 19–32)
Calcium: 8.5 mg/dL (ref 8.4–10.5)
Chloride: 107 mEq/L (ref 96–112)
Creatinine, Ser: 3.56 mg/dL — ABNORMAL HIGH (ref 0.40–1.50)
GFR: 19.79 mL/min — ABNORMAL LOW (ref >60.00)
Glucose, Bld: 101 mg/dL — ABNORMAL HIGH (ref 70–99)
Potassium: 4.5 mEq/L (ref 3.5–5.1)
Sodium: 136 mEq/L (ref 135–145)

## 2021-02-28 ENCOUNTER — Encounter: Payer: Self-pay | Admitting: Internal Medicine

## 2021-03-01 ENCOUNTER — Emergency Department (HOSPITAL_COMMUNITY): Payer: Medicaid Other

## 2021-03-01 ENCOUNTER — Other Ambulatory Visit: Payer: Self-pay

## 2021-03-01 ENCOUNTER — Encounter (HOSPITAL_COMMUNITY): Payer: Self-pay | Admitting: *Deleted

## 2021-03-01 ENCOUNTER — Inpatient Hospital Stay (HOSPITAL_COMMUNITY)
Admission: EM | Admit: 2021-03-01 | Discharge: 2021-03-02 | DRG: 305 | Disposition: A | Discharge status: Home / Self Care | Payer: Medicaid Other | Attending: Internal Medicine | Admitting: Internal Medicine

## 2021-03-01 DIAGNOSIS — E785 Hyperlipidemia, unspecified: Secondary | ICD-10-CM | POA: Diagnosis present

## 2021-03-01 DIAGNOSIS — E860 Dehydration: Secondary | ICD-10-CM | POA: Diagnosis present

## 2021-03-01 DIAGNOSIS — E118 Type 2 diabetes mellitus with unspecified complications: Secondary | ICD-10-CM | POA: Diagnosis present

## 2021-03-01 DIAGNOSIS — I169 Hypertensive crisis, unspecified: Secondary | ICD-10-CM

## 2021-03-01 DIAGNOSIS — N189 Chronic kidney disease, unspecified: Secondary | ICD-10-CM

## 2021-03-01 DIAGNOSIS — I160 Hypertensive urgency: Secondary | ICD-10-CM | POA: Diagnosis present

## 2021-03-01 DIAGNOSIS — Z91120 Patient's intentional underdosing of medication regimen due to financial hardship: Secondary | ICD-10-CM

## 2021-03-01 DIAGNOSIS — Z833 Family history of diabetes mellitus: Secondary | ICD-10-CM

## 2021-03-01 DIAGNOSIS — I10 Essential (primary) hypertension: Secondary | ICD-10-CM | POA: Diagnosis present

## 2021-03-01 DIAGNOSIS — N183 Chronic kidney disease, stage 3 unspecified: Secondary | ICD-10-CM | POA: Diagnosis present

## 2021-03-01 DIAGNOSIS — Z794 Long term (current) use of insulin: Secondary | ICD-10-CM

## 2021-03-01 DIAGNOSIS — F17210 Nicotine dependence, cigarettes, uncomplicated: Secondary | ICD-10-CM | POA: Diagnosis present

## 2021-03-01 DIAGNOSIS — Z79899 Other long term (current) drug therapy: Secondary | ICD-10-CM

## 2021-03-01 DIAGNOSIS — E1169 Type 2 diabetes mellitus with other specified complication: Secondary | ICD-10-CM | POA: Diagnosis present

## 2021-03-01 DIAGNOSIS — E1165 Type 2 diabetes mellitus with hyperglycemia: Secondary | ICD-10-CM | POA: Diagnosis present

## 2021-03-01 DIAGNOSIS — Z20822 Contact with and (suspected) exposure to covid-19: Secondary | ICD-10-CM | POA: Diagnosis present

## 2021-03-01 DIAGNOSIS — E1122 Type 2 diabetes mellitus with diabetic chronic kidney disease: Secondary | ICD-10-CM | POA: Diagnosis present

## 2021-03-01 DIAGNOSIS — R739 Hyperglycemia, unspecified: Secondary | ICD-10-CM

## 2021-03-01 DIAGNOSIS — N179 Acute kidney failure, unspecified: Secondary | ICD-10-CM | POA: Diagnosis present

## 2021-03-01 DIAGNOSIS — I129 Hypertensive chronic kidney disease with stage 1 through stage 4 chronic kidney disease, or unspecified chronic kidney disease: Secondary | ICD-10-CM | POA: Diagnosis present

## 2021-03-01 DIAGNOSIS — E119 Type 2 diabetes mellitus without complications: Secondary | ICD-10-CM | POA: Diagnosis present

## 2021-03-01 DIAGNOSIS — N1831 Chronic kidney disease, stage 3a: Secondary | ICD-10-CM | POA: Diagnosis present

## 2021-03-01 DIAGNOSIS — Z7984 Long term (current) use of oral hypoglycemic drugs: Secondary | ICD-10-CM

## 2021-03-01 LAB — COMPREHENSIVE METABOLIC PANEL
ALT: 14 U/L (ref 0–44)
AST: 27 U/L (ref 15–41)
Albumin: 3 g/dL — ABNORMAL LOW (ref 3.5–5.0)
Alkaline Phosphatase: 78 U/L (ref 38–126)
Anion gap: 6 (ref 5–15)
BUN: 28 mg/dL — ABNORMAL HIGH (ref 6–20)
CO2: 22 mmol/L (ref 22–32)
Calcium: 8.4 mg/dL — ABNORMAL LOW (ref 8.9–10.3)
Chloride: 109 mmol/L (ref 98–111)
Creatinine, Ser: 3.64 mg/dL — ABNORMAL HIGH (ref 0.61–1.24)
GFR, Estimated: 20 mL/min — ABNORMAL LOW (ref >60)
Glucose, Bld: 138 mg/dL — ABNORMAL HIGH (ref 70–99)
Potassium: 5.1 mmol/L (ref 3.5–5.1)
Sodium: 137 mmol/L (ref 135–145)
Total Bilirubin: 0.6 mg/dL (ref 0.3–1.2)
Total Protein: 7 g/dL (ref 6.5–8.1)

## 2021-03-01 LAB — CBC WITH DIFFERENTIAL/PLATELET
Abs Immature Granulocytes: 0.02 10*3/uL (ref 0.00–0.07)
Basophils Absolute: 0.1 10*3/uL (ref 0.0–0.1)
Basophils Relative: 1 %
Eosinophils Absolute: 0.3 10*3/uL (ref 0.0–0.5)
Eosinophils Relative: 4 %
HCT: 36.8 % — ABNORMAL LOW (ref 39.0–52.0)
Hemoglobin: 11.7 g/dL — ABNORMAL LOW (ref 13.0–17.0)
Immature Granulocytes: 0 %
Lymphocytes Relative: 26 %
Lymphs Abs: 2.3 10*3/uL (ref 0.7–4.0)
MCH: 27.5 pg (ref 26.0–34.0)
MCHC: 31.8 g/dL (ref 30.0–36.0)
MCV: 86.4 fL (ref 80.0–100.0)
Monocytes Absolute: 0.7 10*3/uL (ref 0.1–1.0)
Monocytes Relative: 8 %
Neutro Abs: 5.3 10*3/uL (ref 1.7–7.7)
Neutrophils Relative %: 61 %
Platelets: 421 10*3/uL — ABNORMAL HIGH (ref 150–400)
RBC: 4.26 MIL/uL (ref 4.22–5.81)
RDW: 13.7 % (ref 11.5–15.5)
WBC: 8.7 10*3/uL (ref 4.0–10.5)
nRBC: 0 % (ref 0.0–0.2)

## 2021-03-01 LAB — RESP PANEL BY RT-PCR (FLU A&B, COVID) ARPGX2
Influenza A by PCR: NEGATIVE
Influenza B by PCR: NEGATIVE
SARS Coronavirus 2 by RT PCR: NEGATIVE

## 2021-03-01 LAB — TROPONIN I (HIGH SENSITIVITY)
Troponin I (High Sensitivity): 24 ng/L — ABNORMAL HIGH (ref <18)
Troponin I (High Sensitivity): 26 ng/L — ABNORMAL HIGH (ref <18)

## 2021-03-01 LAB — GLUCOSE, CAPILLARY
Glucose-Capillary: 118 mg/dL — ABNORMAL HIGH (ref 70–99)
Glucose-Capillary: 147 mg/dL — ABNORMAL HIGH (ref 70–99)

## 2021-03-01 MED ORDER — HYDRALAZINE HCL 10 MG PO TABS
10.0000 mg | ORAL_TABLET | ORAL | Status: AC
  Administered 2021-03-01 – 2021-03-02 (×5): 10 mg via ORAL
  Filled 2021-03-01 (×5): qty 1

## 2021-03-01 MED ORDER — CLONIDINE HCL 0.1 MG PO TABS
0.1000 mg | ORAL_TABLET | Freq: Two times a day (BID) | ORAL | Status: DC
  Administered 2021-03-01 – 2021-03-02 (×2): 0.1 mg via ORAL
  Filled 2021-03-01 (×2): qty 1

## 2021-03-01 MED ORDER — CLONIDINE HCL 0.1 MG PO TABS
0.1000 mg | ORAL_TABLET | Freq: Once | ORAL | Status: AC
  Administered 2021-03-01: 0.1 mg via ORAL
  Filled 2021-03-01: qty 1

## 2021-03-01 MED ORDER — SODIUM CHLORIDE 0.9 % IV BOLUS
500.0000 mL | Freq: Once | INTRAVENOUS | Status: AC
  Administered 2021-03-01: 500 mL via INTRAVENOUS

## 2021-03-01 MED ORDER — SIMVASTATIN 20 MG PO TABS
20.0000 mg | ORAL_TABLET | Freq: Every day | ORAL | Status: DC
  Administered 2021-03-01: 20 mg via ORAL
  Filled 2021-03-01: qty 1

## 2021-03-01 MED ORDER — HEPARIN SODIUM (PORCINE) 5000 UNIT/ML IJ SOLN
5000.0000 [IU] | Freq: Three times a day (TID) | INTRAMUSCULAR | Status: DC
  Administered 2021-03-01 – 2021-03-02 (×4): 5000 [IU] via SUBCUTANEOUS
  Filled 2021-03-01 (×4): qty 1

## 2021-03-01 MED ORDER — AMLODIPINE BESYLATE 10 MG PO TABS
10.0000 mg | ORAL_TABLET | Freq: Every day | ORAL | Status: DC
  Administered 2021-03-01 – 2021-03-02 (×2): 10 mg via ORAL
  Filled 2021-03-01: qty 2
  Filled 2021-03-01: qty 1

## 2021-03-01 MED ORDER — INSULIN GLARGINE 100 UNIT/ML SOLOSTAR PEN: 20.0000 [IU] | PEN_INJECTOR | Freq: Every day | SUBCUTANEOUS | Status: DC

## 2021-03-01 MED ORDER — TRAZODONE HCL 50 MG PO TABS: 25.0000 mg | ORAL_TABLET | Freq: Every evening | ORAL | Status: DC | PRN

## 2021-03-01 MED ORDER — ACETAMINOPHEN 325 MG PO TABS
650.0000 mg | ORAL_TABLET | Freq: Four times a day (QID) | ORAL | Status: DC | PRN
  Administered 2021-03-01: 650 mg via ORAL
  Filled 2021-03-01: qty 2

## 2021-03-01 MED ORDER — LABETALOL HCL 5 MG/ML IV SOLN
10.0000 mg | Freq: Once | INTRAVENOUS | Status: AC
  Administered 2021-03-01: 10 mg via INTRAVENOUS
  Filled 2021-03-01: qty 4

## 2021-03-01 MED ORDER — METOPROLOL TARTRATE 25 MG PO TABS
25.0000 mg | ORAL_TABLET | Freq: Two times a day (BID) | ORAL | Status: DC
  Administered 2021-03-01 – 2021-03-02 (×2): 25 mg via ORAL
  Filled 2021-03-01 (×2): qty 1

## 2021-03-01 MED ORDER — ACETAMINOPHEN 325 MG PO TABS: 650.0000 mg | ORAL_TABLET | Freq: Four times a day (QID) | ORAL | Status: DC | PRN

## 2021-03-01 MED ORDER — INSULIN LISPRO (1 UNIT DIAL) 100 UNIT/ML (KWIKPEN): 7.0000 [IU] | PEN_INJECTOR | Freq: Three times a day (TID) | SUBCUTANEOUS | Status: DC

## 2021-03-01 MED ORDER — SODIUM CHLORIDE 0.9 % IV SOLN: INTRAVENOUS | Status: AC

## 2021-03-01 MED ORDER — ACETAMINOPHEN 650 MG RE SUPP: 650.0000 mg | Freq: Four times a day (QID) | RECTAL | Status: DC | PRN

## 2021-03-01 NOTE — ED Provider Notes (Signed)
[place] EMERGENCY DEPARTMENT Provider Note   CSN: [id number] Arrival date & time: 03/01/21  1127    History Chief Complaint  Patient presents with  . Abnormal Lab    Levi Hodges is a 45 y.o. male with past medical history significant for diabetes, hypertension, CKD who presents for evaluation of abnormal lab.  Unfortunately due to lack of insurance has fallen out of touch with his PCP (last seen 09/2019). Had been feeling "unwell" with generalized fatigue.  He touch base with his old PCP who ordered some labs.  Noted to have significant increase in patient's kidney function.  Of note he is also been out of his diabetes, hypertension medications since he was last seen by his PCP.  Does note some chronic left shoulder pain which has been present for months.  There has been no change to this.  He denies any headache, lightness, dizziness, chest pain, shortness of breath, abdominal pain, diarrhea, dysuria.  He denies any changes to his urine.  PCP call patient told to come to emergency department for admission due to significantly elevated creatinine from baseline  Denies additional aggravating or alleviating factors  History obtained from patient and past medical records.  No interpreter used  PCP- Dr. Pricilla Holm Vel[facility] Heckler PCP)  Chart review BP normally 140's over 80-100  HPI     Past Medical History:  Diagnosis Date  . Diabetes mellitus without complication ([place])   . Hypertension     Patient Active Problem List   Diagnosis Date Noted  . Hypertensive urgency 03/01/2021  . Diabetes mellitus type 2 with complications ([place]) 01/56/1537  . Low back pain 03/11/2019  . CKD stage 3 due to type 2 diabetes mellitus ([place]) 03/11/2019  . Patient's intentional underdosing of medication regimen due to financial hardship 08/27/2018  . Hyperlipidemia associated with type 2 diabetes mellitus ([place]) 01/05/2017  . Essential hypertension 01/05/2017  . Tobacco abuse  01/05/2017  . ED (erectile dysfunction) 01/05/2017    History reviewed. No pertinent surgical history.     Family History  Problem Relation Age of Onset  . Diabetes Maternal Grandfather     Social History   Tobacco Use  . Smoking status: Current Every Day Smoker    Packs/day: 1.00  . Smokeless tobacco: Never Used  Vaping Use  . Vaping Use: Never used  Substance Use Topics  . Alcohol use: No  . Drug use: No    Home Medications Prior to Admission medications   Medication Sig Start Date End Date Taking? Authorizing Provider  acetaminophen (TYLENOL) 325 MG tablet Take 650 mg by mouth every 6 (six) hours as needed for headache.   Yes [provider]  cloNIDine (CATAPRES) 0.1 MG tablet Take 0.1 mg by mouth 2 (two) times daily.   Yes [provider]  lisinopril (ZESTRIL) 20 MG tablet Take 1 tablet (20 mg total) by mouth daily. NEEDS OFFICE VISIT 11/08/20  Yes Hoyt Koch, MD  amLODipine (NORVASC) 10 MG tablet TAKE 1 TABLET(10 MG) BY MOUTH DAILY Patient not taking: No sig reported 09/21/19   Hoyt Koch, MD  Blood Glucose Monitoring Suppl ([place]) w/Device KIT 1 Device by Does not apply route as directed. 03/21/19   [name], Melanie Crazier, MD  glucose blood (ONETOUCH VERIO) test strip 4x daily 03/21/19   [name], Melanie Crazier, MD  Insulin Glargine (LANTUS SOLOSTAR) 100 UNIT/ML Solostar Pen Inject 20 Units into the skin daily. Patient not taking: No sig reported 09/21/19  Hoyt Koch, MD  insulin lispro (HUMALOG KWIKPEN) 100 UNIT/ML KwikPen Inject 0.07 mLs (7 Units total) into the skin 3 (three) times daily. Patient not taking: No sig reported 09/21/19   Hoyt Koch, MD  Insulin Pen Needle (PEN NEEDLES) 31G X 5 MM MISC 1 Package by Does not apply route daily. 04/15/19   [name], Melanie Crazier, MD  metoprolol tartrate (LOPRESSOR) 25 MG tablet Take 1 tablet (25 mg total) by mouth 2 (two) times  daily. Patient not taking: No sig reported 09/21/19   Hoyt Koch, MD  simvastatin (ZOCOR) 20 MG tablet TAKE 1 TABLET(20 MG) BY MOUTH AT BEDTIME Patient not taking: No sig reported 09/21/19   Hoyt Koch, MD    Allergies    Patient has no known allergies.  Review of Systems   Review of Systems  Constitutional: Positive for fatigue. Negative for activity change, appetite change, chills, diaphoresis, fever and unexpected weight change.  HENT: Negative.   Respiratory: Negative.   Cardiovascular: Negative.   Gastrointestinal: Negative.   Genitourinary: Negative.   Musculoskeletal: Negative for arthralgias, back pain, gait problem, neck pain and neck stiffness.       Chronic left shoulder pain  Skin: Negative.   Neurological: Negative.   All other systems reviewed and are negative.   Physical Exam Updated Vital Signs BP (!) 159/101   Pulse 94   Temp 98.5 F (36.9 C) (Oral)   Resp (!) 26   Ht _0  (1.753 m)   Wt 81.6 kg   SpO2 100%   BMI 26.58 kg/m   Physical Exam Vitals and nursing note reviewed.  Constitutional:      General: He is not in acute distress.    Appearance: He is well-developed. He is not ill-appearing, toxic-appearing or diaphoretic.  HENT:     Head: Normocephalic and atraumatic.     Nose: Nose normal.     Mouth/Throat:     Mouth: Mucous membranes are moist.  Eyes:     Pupils: Pupils are equal, round, and reactive to light.  Cardiovascular:     Rate and Rhythm: Normal rate and regular rhythm.     Pulses: Normal pulses.          Radial pulses are 2+ on the right side and 2+ on the left side.       Dorsalis pedis pulses are 2+ on the right side and 2+ on the left side.     Heart sounds: Normal heart sounds.  Pulmonary:     Effort: Pulmonary effort is normal. No respiratory distress.     Breath sounds: Normal breath sounds.  Abdominal:     General: Bowel sounds are normal. There is no distension.     Palpations: Abdomen is soft.      Tenderness: There is no abdominal tenderness. There is no right CVA tenderness, left CVA tenderness or guarding.  Musculoskeletal:        General: No swelling, tenderness, deformity or signs of injury. Normal range of motion.     Cervical back: Normal range of motion and neck supple.     Right lower leg: No edema.     Left lower leg: No edema.     Comments: Nontender left shoulder.  Full range of motion without difficulty.  Compartments soft  Skin:    General: Skin is warm and dry.     Capillary Refill: Capillary refill takes less than 2 seconds.  Neurological:     General: No focal  deficit present.     Mental Status: He is alert and oriented to person, place, and time.    ED Results / Procedures / Treatments   Labs (all labs ordered are listed, but only abnormal results are displayed) Labs Reviewed  COMPREHENSIVE METABOLIC PANEL - Abnormal; Notable for the following components:      Result Value   Glucose, Bld 138 (*)    BUN 28 (*)    Creatinine, Ser 3.64 (*)    Calcium 8.4 (*)    Albumin 3.0 (*)    GFR, Estimated 20 (*)    All other components within normal limits  CBC WITH DIFFERENTIAL/PLATELET - Abnormal; Notable for the following components:   Hemoglobin 11.7 (*)    HCT 36.8 (*)    Platelets 421 (*)    All other components within normal limits  TROPONIN I (HIGH SENSITIVITY) - Abnormal; Notable for the following components:   Troponin I (High Sensitivity) 24 (*)    All other components within normal limits  TROPONIN I (HIGH SENSITIVITY) - Abnormal; Notable for the following components:   Troponin I (High Sensitivity) 26 (*)    All other components within normal limits  RESP PANEL BY RT-PCR (FLU A&B, COVID) ARPGX2  HIV ANTIBODY (ROUTINE TESTING W REFLEX)  BASIC METABOLIC PANEL   EKG EKG Interpretation  Date/Time:  Friday March 01 2021 12:13:54 EDT Ventricular Rate:  80 PR Interval:  161 QRS Duration: 76 QT Interval:  371 QTC Calculation: 428 R  Axis:   81 Text Interpretation: Sinus rhythm Probable left atrial enlargement Anteroseptal infarct, old Abnrm T, consider ischemia, anterolateral lds no prior ECG for comparison. diffuse t waveinversions. No STEMI Confirmed by Antony Blackbird [phone]) on 03/01/2021 12:18:09 PM   Radiology DG Chest 2 View  Result Date: 03/01/2021 CLINICAL DATA:  Shortness of breath EXAM: CHEST - 2 VIEW COMPARISON:  July 14, 2018 FINDINGS: Lungs are clear. The heart size and pulmonary vascularity are normal. No adenopathy. No bone lesions. IMPRESSION: Lungs clear.  Cardiac silhouette normal. Electronically Signed   By: Lowella Grip III M.D.   On: 03/01/2021 13:24   US Renal  Result Date: 03/01/2021 CLINICAL DATA:  Renal failure EXAM: RENAL / URINARY TRACT ULTRASOUND COMPLETE COMPARISON:  April 29, 2019 FINDINGS: Right Kidney: Renal measurements: 12.6 x 4.7 x 6.3 cm = volume: 194.9 mL. Echogenicity and renal cortical thickness are within normal limits. No mass, perinephric fluid, or hydronephrosis visualized. No sonographically demonstrable calculus or ureterectasis. Left Kidney: Renal measurements: 11.1 x 7.1 x 6.8 cm = volume: 279.6 mL. Echogenicity and renal cortical thickness are within normal limits. No mass, perinephric fluid, or hydronephrosis visualized. No sonographically demonstrable calculus or ureterectasis. Bladder: Appears normal for degree of bladder distention. Other: None. IMPRESSION: Study within normal limits. Electronically Signed   By: Lowella Grip III M.D.   On: 03/01/2021 13:24    Procedures .Critical Care Performed by: Nettie Elm, PA-C Authorized by: Nettie Elm, PA-C   Critical care provider statement:    Critical care time (minutes):  45   Critical care was necessary to treat or prevent imminent or life-threatening deterioration of the following conditions:  Circulatory failure and renal failure   Critical care was time spent personally by me on the following  activities:  Discussions with consultants, evaluation of patient's response to treatment, examination of patient, ordering and performing treatments and interventions, ordering and review of laboratory studies, ordering and review of radiographic studies, pulse oximetry, re-evaluation of patient's condition,  obtaining history from patient or surrogate and review of old charts     Medications Ordered in ED Medications  amLODipine (NORVASC) tablet 10 mg (10 mg Oral Given 03/01/21 1756)  metoprolol tartrate (LOPRESSOR) tablet 25 mg (has no administration in time range)  simvastatin (ZOCOR) tablet 20 mg (20 mg Oral Given 03/01/21 1757)  heparin injection 5,000 Units (5,000 Units Subcutaneous Given 03/01/21 1756)  0.9 %  sodium chloride infusion ( Intravenous New Bag/Given 03/01/21 1754)  acetaminophen (TYLENOL) tablet 650 mg (has no administration in time range)    Or  acetaminophen (TYLENOL) suppository 650 mg (has no administration in time range)  traZODone (DESYREL) tablet 25 mg (has no administration in time range)  hydrALAZINE (APRESOLINE) tablet 10 mg (10 mg Oral Given 03/01/21 1757)  cloNIDine (CATAPRES) tablet 0.1 mg (has no administration in time range)  sodium chloride 0.9 % bolus 500 mL (0 mLs Intravenous Stopped 03/01/21 1350)  labetalol (NORMODYNE) injection 10 mg (10 mg Intravenous Given 03/01/21 1231)  cloNIDine (CATAPRES) tablet 0.1 mg (0.1 mg Oral Given 03/01/21 1537)    ED Course  I have reviewed the triage vital signs and the nursing notes.  Pertinent labs & imaging results that were available during my care of the patient were reviewed by me and considered in my medical decision making (see chart for details).  45 year old here for evaluation of abnormal labs.  He is afebrile, nonseptic, non-ill-appearing.  Labs done by PCP due to feeling "unwell".  Unfortunately seems like he was lost to follow-up due to lack of insurance.  He is last office visit was approximately year and half  ago.  PCP did run labs without visit due to patient's complaint, noted severely elevated creatinine.  Per chart review it appears patient many years ago had biopsy of his kidney which showed some changes due to hypertension and diabetes.  Appears his baseline creatinine is typically around 1.8-1.5 creatinine with PCP 2 days ago was 3.56.  Patient did state he had a "GI bug" 2 weeks ago with loose stools and 1 episode of emesis however is been tolerating p.o. intake at home.  Patient is significantly hypertensive here with systolics greater than 681 and diastolics at 157.  We will plan on IV labetalol.  Question patient with hypertensive emergency given known acute renal failure? We will plan on labs, imaging and reassess.  Labs and imaging personally reviewed and interpreted:  CBC without leukocytosis, Hgb 11.7 CMP Leukos 138, BUN 28, creatinine 3.64, albumin 3.0, GFR 20 Trop COVID pending US renal without acute findings DG chest without cardiomegaly, pulm edema, pneumothorax, infiltrate EKG with diffuse t wave inversions. No STEMI  Patient reassessed.  Blood pressure trending down with labetalol.  Will need to be admitted for acute renal failure and hypertensive emergency.  Unclear whether patient's acute renal failure is due to his significantly elevated blood pressures today versus worsening of his CKD.  Patient to be admitted for acute renal failure, hypertensive crisis with elevated troponin  CONSULT with Dr. Linda Hedges with [facility] who is agreeable to evaluate patient for admission.   The patient appears reasonably stabilized for admission considering the current resources, flow, and capabilities available in the ED at this time, and I doubt any other [facility] requiring further screening and/or treatment in the ED prior to admission.   Patient discussed with attending, Dr. Sherry Ruffing who is agreeable with above treatment, plan and disposition     MDM Rules/Calculators/A&P  Final Clinical Impression(s) / ED Diagnoses Final diagnoses:  Acute renal failure superimposed on chronic kidney disease, unspecified CKD stage, unspecified acute renal failure type ([place])  Hyperglycemia  Hypertensive crisis    Rx / DC Orders ED Discharge Orders    None       Erric Machnik A, PA-C 03/01/21 1826    Tegeler, Gwenyth Allegra, MD 03/02/21 2014

## 2021-03-01 NOTE — ED Triage Notes (Signed)
States he was called by his pcp to come to the ed for adnormal labs. No pain

## 2021-03-01 NOTE — H&P (Signed)
History and Physical    Araceli Coufal ELT:[id number] DOB: 11-04-1975 DOA: 03/01/2021  PCP: Hoyt Koch, MD   Patient coming from: home  I have personally briefly reviewed patient's old medical records in [place]  Chief Complaint: acute on chronic renal insufficiency  HPI: Jabreel Chimento is a 45 y.o. male with medical history significant of DM 2 who was on insulin and HTN who lost his insurance when he lost his job during [employer] pandemic and has not been able to see his PCP nor afford his medications. He was feeling bad. At his request his PCP ordered lab work which revealed Cr 3.68, last baseline 1.8. Has known diabetic nephropathy by renal bx in July '20. He was advised to present to ED for evaluation and treatment.   .  ED Course: T 98.5  BP 230/130, after labetolol 10 mg IV  180/103,  HR 80  RR 15. Lab revealed K 5.1, BUN 28, Cr 3.68  Glucose 138, CBCD nl, Renal U/S nl, EKG w/o STEMI, Troponin 24. [facility] called to admit for continued management of hypertensive urgency and acute on chronic renal insufficiency.   Review of Systems: As per HPI otherwise 10 point review of systems negative.    Past Medical History:  Diagnosis Date  . Diabetes mellitus without complication ([place])   . Hypertension    Soc Hx - married 22 years. He has two daughter and a son. He has resumed working but does not yet qualify for insurance.    History reviewed. No pertinent surgical history.   reports that he has been smoking. He has been smoking about 1.00 pack per day. He has never used smokeless tobacco. He reports that he does not drink alcohol and does not use drugs.  No Known Allergies  Family History  Problem Relation Age of Onset  . Diabetes Maternal Grandfather      Prior to Admission medications   Medication Sig Start Date End Date Taking? Authorizing Provider  acetaminophen (TYLENOL) 325 MG tablet Take 650 mg by mouth every 6 (six) hours as needed for headache.   Yes  [provider]  cloNIDine (CATAPRES) 0.1 MG tablet Take 0.1 mg by mouth 2 (two) times daily.   Yes [provider]  lisinopril (ZESTRIL) 20 MG tablet Take 1 tablet (20 mg total) by mouth daily. NEEDS OFFICE VISIT 11/08/20  Yes Hoyt Koch, MD  amLODipine (NORVASC) 10 MG tablet TAKE 1 TABLET(10 MG) BY MOUTH DAILY Patient not taking: No sig reported 09/21/19   Hoyt Koch, MD  Blood Glucose Monitoring Suppl ([place]) w/Device KIT 1 Device by Does not apply route as directed. 03/21/19   [name], Melanie Crazier, MD  glucose blood (ONETOUCH VERIO) test strip 4x daily 03/21/19   [name], Melanie Crazier, MD  Insulin Glargine (LANTUS SOLOSTAR) 100 UNIT/ML Solostar Pen Inject 20 Units into the skin daily. Patient not taking: No sig reported 09/21/19   Hoyt Koch, MD  insulin lispro (HUMALOG KWIKPEN) 100 UNIT/ML KwikPen Inject 0.07 mLs (7 Units total) into the skin 3 (three) times daily. Patient not taking: No sig reported 09/21/19   Hoyt Koch, MD  Insulin Pen Needle (PEN NEEDLES) 31G X 5 MM MISC 1 Package by Does not apply route daily. 04/15/19   [name], Melanie Crazier, MD  metoprolol tartrate (LOPRESSOR) 25 MG tablet Take 1 tablet (25 mg total) by mouth 2 (two) times daily. Patient not taking: No sig reported 09/21/19   Pricilla Holm  A, MD  simvastatin (ZOCOR) 20 MG tablet TAKE 1 TABLET(20 MG) BY MOUTH AT BEDTIME Patient not taking: No sig reported 09/21/19   Hoyt Koch, MD    Physical Exam: Vitals:   03/01/21 1415 03/01/21 1500 03/01/21 1515 03/01/21 1530  BP: (!) 176/103 (!) 180/105 (!) 180/103 (!) 212/114  Pulse: 82 77 80 76  Resp: (!) 23 (!) 22 15 (!) 23  Temp:      TempSrc:      SpO2: 98% 99% 100% 99%  Weight:      Height:         Vitals:   03/01/21 1415 03/01/21 1500 03/01/21 1515 03/01/21 1530  BP: (!) 176/103 (!) 180/105 (!) 180/103 (!) 212/114  Pulse: 82 77 80 76  Resp: (!)  23 (!) 22 15 (!) 23  Temp:      TempSrc:      SpO2: 98% 99% 100% 99%  Weight:      Height:       General: WNWD man in no distress. Eyes: PERRL, lids and conjunctivae normal. Fundi on non-dilated exam w/o exudates or hemorrhages, copper wiring appearance noted.  ENMT: Mucous membranes are moist. Posterior pharynx clear of any exudate or lesions.Poor dentition with several caries, broken teeth and missing teeth..  Neck: normal, supple, no masses, no thyromegaly Respiratory: clear to auscultation bilaterally, no wheezing, no crackles. Normal respiratory effort. No accessory muscle use.  Cardiovascular: Regular rate and rhythm, no murmurs / rubs / gallops. No extremity edema. 2+ pedal pulses. No carotid bruits.  Abdomen: no tenderness, no masses palpated. No hepatosplenomegaly. Bowel sounds positive.  Musculoskeletal: no clubbing / cyanosis. No joint deformity upper and lower extremities. Good ROM, no contractures. Normal muscle tone.  Skin: no rashes, lesions, ulcers. No induration Neurologic: CN 2-12 grossly intact. Sensation intact. Strength 5/5 in all 4.  Psychiatric: Normal judgment and insight. Alert and oriented x 3. Normal mood.     Labs on Admission: I have personally reviewed following labs and imaging studies  CBC: Recent Labs  Lab 03/01/21 1135  WBC 8.7  NEUTROABS 5.3  HGB 11.7*  HCT 36.8*  MCV 86.4  PLT 250*   Basic Metabolic Panel: Recent Labs  Lab 02/27/21 1518 03/01/21 1135  NA 136 137  K 4.5 5.1  CL 107 109  CO2 22 22  GLUCOSE 101* 138*  BUN 27* 28*  CREATININE 3.56* 3.64*  CALCIUM 8.5 8.4*   GFR: Estimated Creatinine Clearance: 25.6 mL/min (A) (by C-G formula based on SCr of 3.64 mg/dL (H)). Liver Function Tests: Recent Labs  Lab 03/01/21 1135  AST 27  ALT 14  ALKPHOS 78  BILITOT 0.6  PROT 7.0  ALBUMIN 3.0*   No results for input(s): LIPASE, AMYLASE in the last 168 hours. No results for input(s): AMMONIA in the last 168 hours. Coagulation  Profile: No results for input(s): INR, PROTIME in the last 168 hours. Cardiac Enzymes: No results for input(s): CKTOTAL, CKMB, CKMBINDEX, TROPONINI in the last 168 hours. BNP (last 3 results) No results for input(s): PROBNP in the last 8760 hours. HbA1C: Recent Labs    02/27/21 1518  HGBA1C 6.1   CBG: No results for input(s): GLUCAP in the last 168 hours. Lipid Profile: No results for input(s): CHOL, HDL, LDLCALC, TRIG, CHOLHDL, LDLDIRECT in the last 72 hours. Thyroid Function Tests: No results for input(s): TSH, T4TOTAL, FREET4, T3FREE, THYROIDAB in the last 72 hours. Anemia Panel: No results for input(s): VITAMINB12, FOLATE, FERRITIN, TIBC, IRON, RETICCTPCT in  the last 72 hours. Urine analysis: No results found for: COLORURINE, APPEARANCEUR, [place], [place], GLUCOSEU, [place], BILIRUBINUR, KETONESUR, PROTEINUR, [place], NITRITE, LEUKOCYTESUR  Radiological Exams on Admission: DG Chest 2 View  Result Date: 03/01/2021 CLINICAL DATA:  Shortness of breath EXAM: CHEST - 2 VIEW COMPARISON:  July 14, 2018 FINDINGS: Lungs are clear. The heart size and pulmonary vascularity are normal. No adenopathy. No bone lesions. IMPRESSION: Lungs clear.  Cardiac silhouette normal. Electronically Signed   By: Lowella Grip III M.D.   On: 03/01/2021 13:24   US Renal  Result Date: 03/01/2021 CLINICAL DATA:  Renal failure EXAM: RENAL / URINARY TRACT ULTRASOUND COMPLETE COMPARISON:  April 29, 2019 FINDINGS: Right Kidney: Renal measurements: 12.6 x 4.7 x 6.3 cm = volume: 194.9 mL. Echogenicity and renal cortical thickness are within normal limits. No mass, perinephric fluid, or hydronephrosis visualized. No sonographically demonstrable calculus or ureterectasis. Left Kidney: Renal measurements: 11.1 x 7.1 x 6.8 cm = volume: 279.6 mL. Echogenicity and renal cortical thickness are within normal limits. No mass, perinephric fluid, or hydronephrosis visualized. No sonographically demonstrable calculus or  ureterectasis. Bladder: Appears normal for degree of bladder distention. Other: None. IMPRESSION: Study within normal limits. Electronically Signed   By: Lowella Grip III M.D.   On: 03/01/2021 13:24    EKG: Independently reviewed. NSR, LAE, old anterseptal injury, no STEMI  Assessment/Plan Active Problems:   Essential hypertension   CKD stage 3 due to type 2 diabetes mellitus ([place])   Diabetes mellitus type 2 with complications ([facility])   Hyperlipidemia associated with type 2 diabetes mellitus ([place])   Hypertensive urgency    1. CKD - patient with biopsy known diabetic glmerulonephropathy July '20. Last Cr 1.8. Now with acute rise vs progressive chronic disease. A1C of 6.1% reflects good control. Hypertension has not been controlled. Patient has been doing yard work and dehydration may play a role. Renal U/S nl. Plan - vigorous hydration  Control of risk factors  F/u Bmet in AM  May need nephrology consult  2. Hypertensive urgency - BP at presentation was 230/130 with some reduction briefly after IV dose of labetolol. He has not been taking his medications due to cost issues. Plan Resume amolodipine, clonidine and metoprolol  Hold ACE while evaluating renal function  Hydralazine 10 mg q 4 hrs x 5 doses for acute management  3. DM - patient was previously on basal insulin and before meal insulin. He has lost weight. His A1C 02/27/21 was 6.1% Plan Hold resuming basal and meal-time insulin  Sliding scale to determine medication needs.  4. HLD - resume statin treatment  5. Continuity of care - TOC consult to assist with medications and financial assistance with PCP.  DVT prophylaxis: heparin SQ  Code Status: full code  Family Communication: daughter present during exam  Disposition Plan: home when stable  Consults called: none  Admission status: inpatient    Adella Hare MD [facility] Pager [phone]  If 7PM-7AM, please contact  night-coverage [email or web address] Password [facility]  03/01/2021, 3:53 PM

## 2021-03-01 NOTE — ED Notes (Signed)
Attempted report x1. 

## 2021-03-02 DIAGNOSIS — E118 Type 2 diabetes mellitus with unspecified complications: Secondary | ICD-10-CM

## 2021-03-02 DIAGNOSIS — E785 Hyperlipidemia, unspecified: Secondary | ICD-10-CM

## 2021-03-02 DIAGNOSIS — N183 Chronic kidney disease, stage 3 unspecified: Secondary | ICD-10-CM

## 2021-03-02 DIAGNOSIS — E1122 Type 2 diabetes mellitus with diabetic chronic kidney disease: Secondary | ICD-10-CM

## 2021-03-02 DIAGNOSIS — E1169 Type 2 diabetes mellitus with other specified complication: Secondary | ICD-10-CM

## 2021-03-02 DIAGNOSIS — I160 Hypertensive urgency: Principal | ICD-10-CM

## 2021-03-02 DIAGNOSIS — I10 Essential (primary) hypertension: Secondary | ICD-10-CM

## 2021-03-02 LAB — BASIC METABOLIC PANEL
Anion gap: 7 (ref 5–15)
Anion gap: 7 (ref 5–15)
BUN: 26 mg/dL — ABNORMAL HIGH (ref 6–20)
BUN: 24 mg/dL — ABNORMAL HIGH (ref 6–20)
CO2: 20 mmol/L — ABNORMAL LOW (ref 22–32)
CO2: 19 mmol/L — ABNORMAL LOW (ref 22–32)
Calcium: 7.9 mg/dL — ABNORMAL LOW (ref 8.9–10.3)
Calcium: 8.1 mg/dL — ABNORMAL LOW (ref 8.9–10.3)
Chloride: 110 mmol/L (ref 98–111)
Chloride: 112 mmol/L — ABNORMAL HIGH (ref 98–111)
Creatinine, Ser: 3.31 mg/dL — ABNORMAL HIGH (ref 0.61–1.24)
Creatinine, Ser: 3.12 mg/dL — ABNORMAL HIGH (ref 0.61–1.24)
GFR, Estimated: 22 mL/min — ABNORMAL LOW (ref >60)
GFR, Estimated: 24 mL/min — ABNORMAL LOW (ref >60)
Glucose, Bld: 88 mg/dL (ref 70–99)
Glucose, Bld: 95 mg/dL (ref 70–99)
Potassium: 4.3 mmol/L (ref 3.5–5.1)
Potassium: 4.9 mmol/L (ref 3.5–5.1)
Sodium: 137 mmol/L (ref 135–145)
Sodium: 138 mmol/L (ref 135–145)

## 2021-03-02 LAB — GLUCOSE, CAPILLARY
Glucose-Capillary: 97 mg/dL (ref 70–99)
Glucose-Capillary: 95 mg/dL (ref 70–99)

## 2021-03-02 LAB — HIV ANTIBODY (ROUTINE TESTING W REFLEX): HIV Screen 4th Generation wRfx: NONREACTIVE

## 2021-03-02 MED ORDER — CLONIDINE HCL 0.1 MG PO TABS: 0.1000 mg | ORAL_TABLET | Freq: Two times a day (BID) | ORAL | 0 refills | Status: DC

## 2021-03-02 MED ORDER — AMLODIPINE BESYLATE 10 MG PO TABS: ORAL_TABLET | ORAL | 0 refills | Status: DC

## 2021-03-02 MED ORDER — METOPROLOL TARTRATE 25 MG PO TABS: 25.0000 mg | ORAL_TABLET | Freq: Two times a day (BID) | ORAL | 0 refills | Status: DC

## 2021-03-02 MED ORDER — SIMVASTATIN 20 MG PO TABS: ORAL_TABLET | ORAL | 0 refills | Status: DC

## 2021-03-02 MED ORDER — GLIMEPIRIDE 1 MG PO TABS: 1.0000 mg | ORAL_TABLET | Freq: Every day | ORAL | 2 refills | Status: DC

## 2021-03-02 MED ORDER — SODIUM CHLORIDE 0.9 % IV SOLN: INTRAVENOUS | Status: DC

## 2021-03-02 NOTE — Discharge Instructions (Signed)
Chronic Kidney Disease, Adult Chronic kidney disease (CKD) occurs when the kidneys are slowly and permanently damaged over a long period of time. The kidneys are a pair of organs that do many important jobs in the body, including:  Removing waste and extra fluid from the blood to make urine.  Making hormones that maintain the amount of fluid in tissues and blood vessels.  Maintaining the right amount of fluids and chemicals in the body. A small amount of kidney damage may not cause problems, but a large amount of damage may make it hard or impossible for the kidneys to work right. Steps must be taken to slow kidney damage or to stop it from getting worse. If steps are not taken, the kidneys may stop working permanently (end-stage renal disease, or ESRD). Most of the time, CKD does not go away, but it can often be controlled. People who have CKD are usually able to live full lives. What are the causes? The most common causes of this condition are diabetes and high blood pressure (hypertension). Other causes include:  Cardiovascular diseases. These affect the heart and blood vessels.  Kidney diseases. These include: ? Glomerulonephritis, or inflammation of the tiny filters in the kidneys. ? Interstitial nephritis. This is swelling of the small tubes of the kidneys and of the surrounding structures. ? Polycystic kidney disease, in which clusters of fluid-filled sacs form within the kidneys. ? Renal vascular disease. This includes disorders that affect the arteries and veins of the kidneys.  Diseases that affect the body's defense system (immune system).  A problem with urine flow. This may be caused by: ? Kidney stones. ? Cancer. ? An enlarged prostate, in males.  A kidney infection or urinary tract infection (UTI) that keeps coming back.  Vasculitis. This is swelling or inflammation of the blood vessels. What increases the risk? Your chances of having kidney disease increase with  age. The following factors may make you more likely to develop this condition:  A family history of kidney disease or kidney failure. Kidney failure means the kidneys can no longer work right.  Certain genetic diseases.  Taking medicines often that are damaging to the kidneys.  Being around or being in contact with toxic substances.  Obesity.  A history of tobacco use. What are the signs or symptoms? Symptoms of this condition include:  Feeling very tired (lethargic) and having less energy.  Swelling, or edema, of the face, legs, ankles, or feet.  Nausea or vomiting, or loss of appetite.  Confusion or trouble concentrating.  Muscle twitches and cramps, especially in the legs.  Dry, itchy skin.  A metallic taste in the mouth.  Producing less urine, or producing more urine (especially at night).  Shortness of breath.  Trouble sleeping. CKD may also result in not having enough red blood cells or hemoglobin in the blood (anemia) or having weak bones (bone disease). Symptoms develop slowly and may not be obvious until the kidney damage becomes severe. It is possible to have kidney disease for years without having symptoms. How is this diagnosed? This condition may be diagnosed based on:  Blood tests.  Urine tests.  Imaging tests, such as an ultrasound or a CT scan.  A kidney biopsy. This involves removing a sample of kidney tissue to be looked at under a microscope. Results from these tests will help to determine how serious the CKD is. How is this treated? There is no cure for most cases of this condition, but treatment usually relieves   symptoms and prevents or slows the worsening of the disease. Treatment may include:  Diet changes, which may require you to avoid alcohol and foods that are high in salt, potassium, phosphorous, and protein.  Medicines. These may: ? Lower blood pressure. ? Control blood sugar (glucose). ? Relieve anemia. ? Relieve  swelling. ? Protect your bones. ? Improve the balance of salts and minerals in your blood (electrolytes).  Dialysis, which is a type of treatment that removes toxic waste from the body. It may be needed if you have kidney failure.  Managing any other conditions that are causing your CKD or making it worse. Follow these instructions at home: Medicines  Take over-the-counter and prescription medicines only as told by your health care provider. The amount of some medicines that you take may need to be changed.  Do not take any new medicines unless approved by your health care provider. Many medicines can make kidney damage worse.  Do not take any vitamin and mineral supplements unless approved by your health care provider. Many nutritional supplements can make kidney damage worse. Lifestyle  Do not use any products that contain nicotine or tobacco, such as cigarettes, e-cigarettes, and chewing tobacco. If you need help quitting, ask your health care provider.  If you drink alcohol: ? Limit how much you use to:  0-1 drink a day for women who are not pregnant.  0-2 drinks a day for men. ? Know how much alcohol is in your drink. In the [place] one drink equals one 12 oz bottle of beer (355 mL), one 5 oz glass of wine (148 mL), or one 1 oz glass of hard liquor (44 mL).  Maintain a healthy weight. If you need help, ask your health care provider.   General instructions  Follow instructions from your health care provider about eating or drinking restrictions, including any prescribed diet.  Track your blood pressure at home. Report changes in your blood pressure as told.  If you are being treated for diabetes, track your blood glucose levels as told.  Start or continue an exercise plan. Exercise at least 30 minutes a day, 5 days a week.  Keep your immunizations up to date as told.  Keep all follow-up visits. This is important.   Where to find more information  American Association of  Kidney Patients: www.aakp.org  National Kidney Foundation: www.kidney.org  American Kidney Fund: www.akfinc.org  Life Options: www.lifeoptions.org  Kidney School: www.kidneyschool.org Contact a health care provider if:  Your symptoms get worse.  You develop new symptoms. Get help right away if:  You develop symptoms of ESRD. These include: ? Headaches. ? Numbness in your hands or feet. ? Easy bruising. ? Frequent hiccups. ? Chest pain. ? Shortness of breath. ? Lack of menstrual periods, in women.  You have a fever.  You are producing less urine than usual.  You have pain or bleeding when you urinate or when you have a bowel movement. These symptoms may represent a serious problem that is an emergency. Do not wait to see if the symptoms will go away. Get medical help right away. Call your local emergency services (911 in the [place] Do not drive yourself to the hospital. Summary  Chronic kidney disease (CKD) occurs when the kidneys become damaged slowly over a long period of time.  The most common causes of this condition are diabetes and high blood pressure (hypertension).  There is no cure for most cases of CKD, but treatment usually relieves symptoms and prevents   or slows the worsening of the disease. Treatment may include a combination of lifestyle changes, medicines, and dialysis. This information is not intended to replace advice given to you by your health care provider. Make sure you discuss any questions you have with your health care provider. Document Revised: 01/25/2020 Document Reviewed: 01/25/2020 Elsevier Patient Education  2021 [place].  Hypertension, Adult Hypertension is another name for high blood pressure. High blood pressure forces your heart to work harder to pump blood. This can cause problems over time. There are two numbers in a blood pressure reading. There is a top number (systolic) over a bottom number (diastolic). It is best to have a blood  pressure that is below 120/80. Healthy choices can help lower your blood pressure, or you may need medicine to help lower it. What are the causes? The cause of this condition is not known. Some conditions may be related to high blood pressure. What increases the risk?  Smoking.  Having type 2 diabetes mellitus, high cholesterol, or both.  Not getting enough exercise or physical activity.  Being overweight.  Having too much fat, sugar, calories, or salt (sodium) in your diet.  Drinking too much alcohol.  Having long-term (chronic) kidney disease.  Having a family history of high blood pressure.  Age. Risk increases with age.  Race. You may be at higher risk if you are African American.  Gender. Men are at higher risk than women before age 65. After age 27, women are at higher risk than men.  Having obstructive sleep apnea.  Stress. What are the signs or symptoms?  High blood pressure may not cause symptoms. Very high blood pressure (hypertensive crisis) may cause: ? Headache. ? Feelings of worry or nervousness (anxiety). ? Shortness of breath. ? Nosebleed. ? A feeling of being sick to your stomach (nausea). ? Throwing up (vomiting). ? Changes in how you see. ? Very bad chest pain. ? Seizures. How is this treated?  This condition is treated by making healthy lifestyle changes, such as: ? Eating healthy foods. ? Exercising more. ? Drinking less alcohol.  Your health care provider may prescribe medicine if lifestyle changes are not enough to get your blood pressure under control, and if: ? Your top number is above 130. ? Your bottom number is above 80.  Your personal target blood pressure may vary. Follow these instructions at home: Eating and drinking  If told, follow the DASH eating plan. To follow this plan: ? Fill one half of your plate at each meal with fruits and vegetables. ? Fill one fourth of your plate at each meal with whole grains. Whole grains  include whole-wheat pasta, brown rice, and whole-grain bread. ? Eat or drink low-fat dairy products, such as skim milk or low-fat yogurt. ? Fill one fourth of your plate at each meal with low-fat (lean) proteins. Low-fat proteins include fish, chicken without skin, eggs, beans, and tofu. ? Avoid fatty meat, cured and processed meat, or chicken with skin. ? Avoid pre-made or processed food.  Eat less than 1,500 mg of salt each day.  Do not drink alcohol if: ? Your doctor tells you not to drink. ? You are pregnant, may be pregnant, or are planning to become pregnant.  If you drink alcohol: ? Limit how much you use to:  0-1 drink a day for women.  0-2 drinks a day for men. ? Be aware of how much alcohol is in your drink. In the [place] one drink equals one  12 oz bottle of beer (355 mL), one 5 oz glass of wine (148 mL), or one 1 oz glass of hard liquor (44 mL).   Lifestyle  Work with your doctor to stay at a healthy weight or to lose weight. Ask your doctor what the best weight is for you.  Get at least 30 minutes of exercise most days of the week. This may include walking, swimming, or biking.  Get at least 30 minutes of exercise that strengthens your muscles (resistance exercise) at least 3 days a week. This may include lifting weights or doing Pilates.  Do not use any products that contain nicotine or tobacco, such as cigarettes, e-cigarettes, and chewing tobacco. If you need help quitting, ask your doctor.  Check your blood pressure at home as told by your doctor.  Keep all follow-up visits as told by your doctor. This is important.   Medicines  Take over-the-counter and prescription medicines only as told by your doctor. Follow directions carefully.  Do not skip doses of blood pressure medicine. The medicine does not work as well if you skip doses. Skipping doses also puts you at risk for problems.  Ask your doctor about side effects or reactions to medicines that you should  watch for. Contact a doctor if you:  Think you are having a reaction to the medicine you are taking.  Have headaches that keep coming back (recurring).  Feel dizzy.  Have swelling in your ankles.  Have trouble with your vision. Get help right away if you:  Get a very bad headache.  Start to feel mixed up (confused).  Feel weak or numb.  Feel faint.  Have very bad pain in your: ? Chest. ? Belly (abdomen).  Throw up more than once.  Have trouble breathing. Summary  Hypertension is another name for high blood pressure.  High blood pressure forces your heart to work harder to pump blood.  For most people, a normal blood pressure is less than 120/80.  Making healthy choices can help lower blood pressure. If your blood pressure does not get lower with healthy choices, you may need to take medicine. This information is not intended to replace advice given to you by your health care provider. Make sure you discuss any questions you have with your health care provider. Document Revised: 06/30/2018 Document Reviewed: 06/30/2018 Elsevier Patient Education  2021 [employer].

## 2021-03-02 NOTE — Progress Notes (Signed)
Notified SW about d/c.Patient neds medication assistance.

## 2021-03-02 NOTE — Progress Notes (Signed)
Received referral to assist pt with meds and finding a PCP. Pt doesn't have any insurance. Provided pt with a MATCH letter and a [employer]. Encouraged pt to contact the [facility] on Monday morning and schedule an appointment. Discussed the importance of seeing a doctor after being D/C from the hospital.

## 2021-03-02 NOTE — Progress Notes (Signed)
Patient discharged: Home with family  Discharge paperwork given: to patient and family  Reviewed with teach back  IV and telemetry disconnected  Belongings given to patient

## 2021-03-02 NOTE — Discharge Summary (Signed)
Physician Discharge Summary  Levi Hodges PYK:[id number] DOB: 03-11-1976 DOA: 03/01/2021  PCP: Hoyt Koch, MD  Admit date: 03/01/2021 Discharge date: 03/02/2021  Time spent: 45 minutes  Recommendations for Outpatient Follow-up:  Patient will be discharged to home.  Patient will need to follow up with primary care provider within one week of discharge, repeat BMP. Follow up with nephrology, Dr. Harrie Jeans.  Patient should continue medications as prescribed.  Patient should follow a heart healthy/carb modified diet.    Discharge Diagnoses:  Acute kidney injury on chronic kidney disease, stage IIIa versus progression of chronic kidney disease Hypertensive urgency/uncontrolled hypertension Diabetes mellitus, type II Hyperlipidemia  Discharge Condition: Stable   Diet recommendation: heart healthy/carb modified   Filed Weights   03/01/21 1132 03/02/21 0515  Weight: 81.6 kg 77.2 kg    History of present illness:  On 03/01/2021 by Dr. Adella Hare Levi Hodges is a 45 y.o. male with medical history significant of DM 2 who was on insulin and HTN who lost his insurance when he lost his job during [employer] pandemic and has not been able to see his PCP nor afford his medications. He was feeling bad. At his request his PCP ordered lab work which revealed Cr 3.68, last baseline 1.8. Has known diabetic nephropathy by renal bx in July '20. He was advised to present to ED for evaluation and treatment.     Hospital Course:  Acute kidney injury on chronic kidney disease, stage IIIa versus progression of chronic kidney disease -Patient's last creatinine in 2020 was 1.8 with a GFR of 49 -Upon admission, creatinine was 3.64, Currently down to 3.1 -Patient placed on IV fluids -Suspect worsening creatinine due to uncontrolled hypertension as patient recently ran out of his medications and was noted to have systolic blood pressures in the 200s on admission -Patient currently without any  signs of uremia and is making urine. BUN is 26 -Discussed with nephrology, Dr. Carolin Sicks, recommended outpatient follow-up with Dr. Royce Macadamia.  Control of hypertension.  No further inpatient management at this time. -Repeat BMP in 1 week  Hypertensive urgency/uncontrolled hypertension -Upon admission, BP 230/130, was given IV labetalol -Patient ran out of his medications since he had no refills left -Resume home medications of amlodipine, clonidine, metoprolol; will also provide refills -Lisinopril discontinued  Diabetes mellitus, type II -Last hemoglobin A1c on 02/27/2021 was 6.1 -patient has not been using long or short acting insulin -he has lost weight- possibly diet controlled now. He has been off of his medications for 2 years -Will discharge patient with glimepiride 1 mg daily to start with and he should follow-up with PCP  Hyperlipidemia -continue statin  Procedures: None  Consultations: Nephrology, Dr. Carolin Sicks, via phone  Discharge Exam: Vitals:   03/02/21 0728 03/02/21 1248  BP: (!) 164/99 (!) 157/101  Pulse: 69 63  Resp: 20 20  Temp: 98.4 F (36.9 C) 98.3 F (36.8 C)  SpO2: 99% 100%     General: Well developed, well nourished, NAD, appears stated age  HEENT: NCAT, mucous membranes moist.  Cardiovascular: S1 S2 auscultated, RRR, no murmur  Respiratory: Clear to auscultation bilaterally with equal chest rise  Abdomen: Soft, nontender, nondistended, + bowel sounds  Extremities: warm dry without cyanosis clubbing or edema  Neuro: AAOx3, nonfocal  Psych: Normal affect and demeanor with intact judgement and insight  Discharge Instructions Discharge Instructions    Discharge instructions   Complete by: As directed    Patient will be discharged to home.  Patient will need  to follow up with primary care provider within one week of discharge, repeat BMP. Follow up with nephrology, Dr. Harrie Jeans.  Patient should continue medications as prescribed.  Patient  should follow a heart healthy/carb modified diet.   Increase activity slowly   Complete by: As directed      Allergies as of 03/02/2021   No Known Allergies     Medication List    STOP taking these medications   insulin lispro 100 UNIT/ML KwikPen Commonly known as: HumaLOG KwikPen   Lantus SoloStar 100 UNIT/ML Solostar Pen Generic drug: insulin glargine   lisinopril 20 MG tablet Commonly known as: ZESTRIL     TAKE these medications   acetaminophen 325 MG tablet Commonly known as: TYLENOL Take 650 mg by mouth every 6 (six) hours as needed for headache.   amLODipine 10 MG tablet Commonly known as: NORVASC TAKE 1 TABLET(10 MG) BY MOUTH DAILY   cloNIDine 0.1 MG tablet Commonly known as: CATAPRES Take 1 tablet (0.1 mg total) by mouth 2 (two) times daily.   glimepiride 1 MG tablet Commonly known as: Amaryl Take 1 tablet (1 mg total) by mouth daily with breakfast.   glucose blood test strip Commonly known as: OneTouch Verio 4x daily   metoprolol tartrate 25 MG tablet Commonly known as: LOPRESSOR Take 1 tablet (25 mg total) by mouth 2 (two) times daily.   OneTouch Verio Flex System w/Device Kit 1 Device by Does not apply route as directed.   Pen Needles 31G X 5 MM Misc 1 Package by Does not apply route daily.   simvastatin 20 MG tablet Commonly known as: ZOCOR TAKE 1 TABLET(20 MG) BY MOUTH AT BEDTIME      No Known Allergies    The results of significant diagnostics from this hospitalization (including imaging, microbiology, ancillary and laboratory) are listed below for reference.    Significant Diagnostic Studies: DG Chest 2 View  Result Date: 03/01/2021 CLINICAL DATA:  Shortness of breath EXAM: CHEST - 2 VIEW COMPARISON:  July 14, 2018 FINDINGS: Lungs are clear. The heart size and pulmonary vascularity are normal. No adenopathy. No bone lesions. IMPRESSION: Lungs clear.  Cardiac silhouette normal. Electronically Signed   By: Lowella Grip III M.D.    On: 03/01/2021 13:24   US Renal  Result Date: 03/01/2021 CLINICAL DATA:  Renal failure EXAM: RENAL / URINARY TRACT ULTRASOUND COMPLETE COMPARISON:  April 29, 2019 FINDINGS: Right Kidney: Renal measurements: 12.6 x 4.7 x 6.3 cm = volume: 194.9 mL. Echogenicity and renal cortical thickness are within normal limits. No mass, perinephric fluid, or hydronephrosis visualized. No sonographically demonstrable calculus or ureterectasis. Left Kidney: Renal measurements: 11.1 x 7.1 x 6.8 cm = volume: 279.6 mL. Echogenicity and renal cortical thickness are within normal limits. No mass, perinephric fluid, or hydronephrosis visualized. No sonographically demonstrable calculus or ureterectasis. Bladder: Appears normal for degree of bladder distention. Other: None. IMPRESSION: Study within normal limits. Electronically Signed   By: Lowella Grip III M.D.   On: 03/01/2021 13:24    Microbiology: Recent Results (from the past 240 hour(s))  Resp Panel by RT-PCR (Flu A&B, Covid) Nasopharyngeal Swab     Status: None   Collection Time: 03/01/21  1:50 PM   Specimen: Nasopharyngeal Swab; Nasopharyngeal(NP) swabs in vial transport medium  Result Value Ref Range Status   SARS Coronavirus 2 by RT PCR NEGATIVE NEGATIVE Final    Comment: (NOTE) SARS-CoV-2 target nucleic acids are NOT DETECTED.  The SARS-CoV-2 RNA is generally detectable in upper  respiratory specimens during the acute phase of infection. The lowest concentration of SARS-CoV-2 viral copies this assay can detect is 138 copies/mL. A negative result does not preclude SARS-Cov-2 infection and should not be used as the sole basis for treatment or other patient management decisions. A negative result may occur with  improper specimen collection/handling, submission of specimen other than nasopharyngeal swab, presence of viral mutation(s) within the areas targeted by this assay, and inadequate number of viral copies(<138 copies/mL). A negative result must  be combined with clinical observations, patient history, and epidemiological information. The expected result is Negative.  Fact Sheet for Patients:  EntrepreneurPulse.com.au  Fact Sheet for Healthcare Providers:  IncredibleEmployment.be  This test is no t yet approved or cleared by the [place] FDA and  has been authorized for detection and/or diagnosis of SARS-CoV-2 by FDA under an Emergency Use Authorization (EUA). This EUA will remain  in effect (meaning this test can be used) for the duration of the COVID-19 declaration under Section 564(b)(1) of the Act, 21 U.S.C.section 360bbb-3(b)(1), unless the authorization is terminated  or revoked sooner.       Influenza A by PCR NEGATIVE NEGATIVE Final   Influenza B by PCR NEGATIVE NEGATIVE Final    Comment: (NOTE) The Xpert Xpress SARS-CoV-2/FLU/RSV plus assay is intended as an aid in the diagnosis of influenza from Nasopharyngeal swab specimens and should not be used as a sole basis for treatment. Nasal washings and aspirates are unacceptable for Xpert Xpress SARS-CoV-2/FLU/RSV testing.  Fact Sheet for Patients: EntrepreneurPulse.com.au  Fact Sheet for Healthcare Providers: IncredibleEmployment.be  This test is not yet approved or cleared by the [place] FDA and has been authorized for detection and/or diagnosis of SARS-CoV-2 by FDA under an Emergency Use Authorization (EUA). This EUA will remain in effect (meaning this test can be used) for the duration of the COVID-19 declaration under Section 564(b)(1) of the Act, 21 U.S.C. section 360bbb-3(b)(1), unless the authorization is terminated or revoked.  Performed at [facility], [place] [address], [place], [place] [address]      Labs: Basic Metabolic Panel: Recent Labs  Lab 02/27/21 1518 03/01/21 1135 03/02/21 0021 03/02/21 1129  NA 136 137 137 138  K 4.5 5.1 4.3 4.9  CL 107 109  110 112*  CO2 22 22 20* 19*  GLUCOSE 101* 138* 88 95  BUN 27* 28* 26* 24*  CREATININE 3.56* 3.64* 3.31* 3.12*  CALCIUM 8.5 8.4* 7.9* 8.1*   Liver Function Tests: Recent Labs  Lab 03/01/21 1135  AST 27  ALT 14  ALKPHOS 78  BILITOT 0.6  PROT 7.0  ALBUMIN 3.0*   No results for input(s): LIPASE, AMYLASE in the last 168 hours. No results for input(s): AMMONIA in the last 168 hours. CBC: Recent Labs  Lab 03/01/21 1135  WBC 8.7  NEUTROABS 5.3  HGB 11.7*  HCT 36.8*  MCV 86.4  PLT 421*   Cardiac Enzymes: No results for input(s): CKTOTAL, CKMB, CKMBINDEX, TROPONINI in the last 168 hours. BNP: BNP (last 3 results) No results for input(s): BNP in the last 8760 hours.  ProBNP (last 3 results) No results for input(s): PROBNP in the last 8760 hours.  CBG: Recent Labs  Lab 03/01/21 1907 03/01/21 2136 03/02/21 0650 03/02/21 1132  GLUCAP 118* 147* 97 95       Signed:  Lexis Potenza  [facility] 03/02/2021, 1:42 PM

## 2021-03-05 ENCOUNTER — Telehealth: Payer: Self-pay | Admitting: Internal Medicine

## 2021-03-05 NOTE — Telephone Encounter (Signed)
Transition Care Management Unsuccessful Follow-up Telephone Call  Date of discharge and from where:  03/02/2021 from [facility]   Attempts:  1st Attempt  Reason for unsuccessful TCM follow-up call:  Left voice message     

## 2021-03-08 ENCOUNTER — Inpatient Hospital Stay: Payer: Self-pay | Admitting: Internal Medicine

## 2021-09-13 ENCOUNTER — Ambulatory Visit: Payer: Medicaid Other | Admitting: Internal Medicine

## 2022-03-24 ENCOUNTER — Emergency Department (HOSPITAL_COMMUNITY): Payer: Medicaid Other

## 2022-03-24 ENCOUNTER — Inpatient Hospital Stay (HOSPITAL_COMMUNITY)
Admission: EM | Admit: 2022-03-24 | Discharge: 2022-03-27 | DRG: 673 | Disposition: A | Discharge status: Home / Self Care | Payer: Medicaid Other | Attending: Internal Medicine | Admitting: Internal Medicine

## 2022-03-24 ENCOUNTER — Encounter (HOSPITAL_COMMUNITY): Payer: Self-pay | Admitting: Emergency Medicine

## 2022-03-24 ENCOUNTER — Other Ambulatory Visit: Payer: Self-pay

## 2022-03-24 ENCOUNTER — Inpatient Hospital Stay (HOSPITAL_COMMUNITY): Payer: Medicaid Other

## 2022-03-24 ENCOUNTER — Encounter (HOSPITAL_COMMUNITY): Payer: Medicaid Other

## 2022-03-24 DIAGNOSIS — Z794 Long term (current) use of insulin: Secondary | ICD-10-CM

## 2022-03-24 DIAGNOSIS — D631 Anemia in chronic kidney disease: Secondary | ICD-10-CM | POA: Diagnosis present

## 2022-03-24 DIAGNOSIS — E8770 Fluid overload, unspecified: Secondary | ICD-10-CM | POA: Diagnosis present

## 2022-03-24 DIAGNOSIS — D649 Anemia, unspecified: Secondary | ICD-10-CM

## 2022-03-24 DIAGNOSIS — N184 Chronic kidney disease, stage 4 (severe): Secondary | ICD-10-CM | POA: Diagnosis not present

## 2022-03-24 DIAGNOSIS — Z7984 Long term (current) use of oral hypoglycemic drugs: Secondary | ICD-10-CM | POA: Diagnosis not present

## 2022-03-24 DIAGNOSIS — I5033 Acute on chronic diastolic (congestive) heart failure: Secondary | ICD-10-CM | POA: Diagnosis present

## 2022-03-24 DIAGNOSIS — R778 Other specified abnormalities of plasma proteins: Secondary | ICD-10-CM

## 2022-03-24 DIAGNOSIS — Z79890 Hormone replacement therapy (postmenopausal): Secondary | ICD-10-CM | POA: Diagnosis not present

## 2022-03-24 DIAGNOSIS — I132 Hypertensive heart and chronic kidney disease with heart failure and with stage 5 chronic kidney disease, or end stage renal disease: Secondary | ICD-10-CM | POA: Diagnosis present

## 2022-03-24 DIAGNOSIS — E872 Acidosis, unspecified: Secondary | ICD-10-CM | POA: Diagnosis present

## 2022-03-24 DIAGNOSIS — E8779 Other fluid overload: Secondary | ICD-10-CM | POA: Diagnosis not present

## 2022-03-24 DIAGNOSIS — Z833 Family history of diabetes mellitus: Secondary | ICD-10-CM | POA: Diagnosis not present

## 2022-03-24 DIAGNOSIS — R911 Solitary pulmonary nodule: Secondary | ICD-10-CM | POA: Diagnosis present

## 2022-03-24 DIAGNOSIS — F17210 Nicotine dependence, cigarettes, uncomplicated: Secondary | ICD-10-CM | POA: Diagnosis present

## 2022-03-24 DIAGNOSIS — I120 Hypertensive chronic kidney disease with stage 5 chronic kidney disease or end stage renal disease: Secondary | ICD-10-CM | POA: Diagnosis not present

## 2022-03-24 DIAGNOSIS — Z597 Insufficient social insurance and welfare support: Secondary | ICD-10-CM | POA: Diagnosis not present

## 2022-03-24 DIAGNOSIS — Z720 Tobacco use: Secondary | ICD-10-CM | POA: Diagnosis not present

## 2022-03-24 DIAGNOSIS — E119 Type 2 diabetes mellitus without complications: Secondary | ICD-10-CM

## 2022-03-24 DIAGNOSIS — Z79899 Other long term (current) drug therapy: Secondary | ICD-10-CM | POA: Diagnosis not present

## 2022-03-24 DIAGNOSIS — R600 Localized edema: Secondary | ICD-10-CM | POA: Diagnosis not present

## 2022-03-24 DIAGNOSIS — I161 Hypertensive emergency: Secondary | ICD-10-CM | POA: Diagnosis present

## 2022-03-24 DIAGNOSIS — E1122 Type 2 diabetes mellitus with diabetic chronic kidney disease: Secondary | ICD-10-CM | POA: Diagnosis present

## 2022-03-24 DIAGNOSIS — N186 End stage renal disease: Secondary | ICD-10-CM | POA: Diagnosis present

## 2022-03-24 DIAGNOSIS — N250 Renal osteodystrophy: Secondary | ICD-10-CM | POA: Diagnosis present

## 2022-03-24 DIAGNOSIS — N179 Acute kidney failure, unspecified: Secondary | ICD-10-CM | POA: Diagnosis present

## 2022-03-24 DIAGNOSIS — M7989 Other specified soft tissue disorders: Secondary | ICD-10-CM | POA: Diagnosis not present

## 2022-03-24 DIAGNOSIS — E785 Hyperlipidemia, unspecified: Secondary | ICD-10-CM | POA: Diagnosis present

## 2022-03-24 DIAGNOSIS — Z992 Dependence on renal dialysis: Secondary | ICD-10-CM | POA: Diagnosis not present

## 2022-03-24 HISTORY — DX: Disorder of kidney and ureter, unspecified: N28.9

## 2022-03-24 LAB — CBC WITH DIFFERENTIAL/PLATELET
Abs Immature Granulocytes: 0.01 10*3/uL (ref 0.00–0.07)
Basophils Absolute: 0.1 10*3/uL (ref 0.0–0.1)
Basophils Relative: 1 %
Eosinophils Absolute: 0.6 10*3/uL — ABNORMAL HIGH (ref 0.0–0.5)
Eosinophils Relative: 9 %
HCT: 25.8 % — ABNORMAL LOW (ref 39.0–52.0)
Hemoglobin: 7.9 g/dL — ABNORMAL LOW (ref 13.0–17.0)
Immature Granulocytes: 0 %
Lymphocytes Relative: 18 %
Lymphs Abs: 1.3 10*3/uL (ref 0.7–4.0)
MCH: 26.7 pg (ref 26.0–34.0)
MCHC: 30.6 g/dL (ref 30.0–36.0)
MCV: 87.2 fL (ref 80.0–100.0)
Monocytes Absolute: 0.8 10*3/uL (ref 0.1–1.0)
Monocytes Relative: 11 %
Neutro Abs: 4.3 10*3/uL (ref 1.7–7.7)
Neutrophils Relative %: 61 %
Platelets: 382 10*3/uL (ref 150–400)
RBC: 2.96 MIL/uL — ABNORMAL LOW (ref 4.22–5.81)
RDW: 16.2 % — ABNORMAL HIGH (ref 11.5–15.5)
WBC: 7 10*3/uL (ref 4.0–10.5)
nRBC: 0 % (ref 0.0–0.2)

## 2022-03-24 LAB — CREATININE, URINE, RANDOM: Creatinine, Urine: 45.36 mg/dL

## 2022-03-24 LAB — COMPREHENSIVE METABOLIC PANEL
ALT: 47 U/L — ABNORMAL HIGH (ref 0–44)
AST: 50 U/L — ABNORMAL HIGH (ref 15–41)
Albumin: 3.3 g/dL — ABNORMAL LOW (ref 3.5–5.0)
Alkaline Phosphatase: 162 U/L — ABNORMAL HIGH (ref 38–126)
Anion gap: 9 (ref 5–15)
BUN: 67 mg/dL — ABNORMAL HIGH (ref 6–20)
CO2: 18 mmol/L — ABNORMAL LOW (ref 22–32)
Calcium: 7.7 mg/dL — ABNORMAL LOW (ref 8.9–10.3)
Chloride: 114 mmol/L — ABNORMAL HIGH (ref 98–111)
Creatinine, Ser: 8.77 mg/dL — ABNORMAL HIGH (ref 0.61–1.24)
GFR, Estimated: 7 mL/min — ABNORMAL LOW (ref >60)
Glucose, Bld: 104 mg/dL — ABNORMAL HIGH (ref 70–99)
Potassium: 4.4 mmol/L (ref 3.5–5.1)
Sodium: 141 mmol/L (ref 135–145)
Total Bilirubin: 0.8 mg/dL (ref 0.3–1.2)
Total Protein: 6.8 g/dL (ref 6.5–8.1)

## 2022-03-24 LAB — IRON AND TIBC
Iron: 31 ug/dL — ABNORMAL LOW (ref 45–182)
Saturation Ratios: 12 % — ABNORMAL LOW (ref 17.9–39.5)
TIBC: 267 ug/dL (ref 250–450)
UIBC: 236 ug/dL

## 2022-03-24 LAB — ABO/RH: ABO/RH(D): O POS

## 2022-03-24 LAB — RETICULOCYTES
Immature Retic Fract: 19.7 % — ABNORMAL HIGH (ref 2.3–15.9)
RBC.: 2.63 MIL/uL — ABNORMAL LOW (ref 4.22–5.81)
Retic Count, Absolute: 63.4 10*3/uL (ref 19.0–186.0)
Retic Ct Pct: 2.4 % (ref 0.4–3.1)

## 2022-03-24 LAB — GLUCOSE, CAPILLARY: Glucose-Capillary: 100 mg/dL — ABNORMAL HIGH (ref 70–99)

## 2022-03-24 LAB — ECHOCARDIOGRAM COMPLETE
Area-P 1/2: 4.21 cm2
Calc EF: 59.2 %
Height: 69 in
S' Lateral: 3.1 cm
Single Plane A2C EF: 63.6 %
Single Plane A4C EF: 56.2 %
Weight: 2962.98 oz

## 2022-03-24 LAB — TYPE AND SCREEN
ABO/RH(D): O POS
Antibody Screen: NEGATIVE

## 2022-03-24 LAB — HEPATITIS B SURFACE ANTIBODY,QUALITATIVE: Hep B S Ab: NONREACTIVE

## 2022-03-24 LAB — URINALYSIS, ROUTINE W REFLEX MICROSCOPIC
Bacteria, UA: NONE SEEN
Bilirubin Urine: NEGATIVE
Glucose, UA: NEGATIVE mg/dL
Ketones, ur: NEGATIVE mg/dL
Leukocytes,Ua: NEGATIVE
Nitrite: NEGATIVE
Protein, ur: 100 mg/dL — AB
Specific Gravity, Urine: 1.011 (ref 1.005–1.030)
pH: 5 (ref 5.0–8.0)

## 2022-03-24 LAB — HEMOGLOBIN A1C
Hgb A1c MFr Bld: 5.4 % (ref 4.8–5.6)
Mean Plasma Glucose: 108.28 mg/dL

## 2022-03-24 LAB — TROPONIN I (HIGH SENSITIVITY)
Troponin I (High Sensitivity): 47 ng/L — ABNORMAL HIGH (ref <18)
Troponin I (High Sensitivity): 46 ng/L — ABNORMAL HIGH (ref <18)

## 2022-03-24 LAB — HEMOGLOBIN AND HEMATOCRIT, BLOOD
HCT: 24.4 % — ABNORMAL LOW (ref 39.0–52.0)
Hemoglobin: 8 g/dL — ABNORMAL LOW (ref 13.0–17.0)

## 2022-03-24 LAB — APTT: aPTT: 30 seconds (ref 24–36)

## 2022-03-24 LAB — PHOSPHORUS: Phosphorus: 5.7 mg/dL — ABNORMAL HIGH (ref 2.5–4.6)

## 2022-03-24 LAB — PROTIME-INR
INR: 1.1 (ref 0.8–1.2)
Prothrombin Time: 14.5 seconds (ref 11.4–15.2)

## 2022-03-24 LAB — HEPATITIS B SURFACE ANTIGEN: Hepatitis B Surface Ag: NONREACTIVE

## 2022-03-24 LAB — CBG MONITORING, ED: Glucose-Capillary: 106 mg/dL — ABNORMAL HIGH (ref 70–99)

## 2022-03-24 LAB — FOLATE: Folate: 8.5 ng/mL (ref >5.9)

## 2022-03-24 LAB — SODIUM, URINE, RANDOM: Sodium, Ur: 110 mmol/L

## 2022-03-24 LAB — D-DIMER, QUANTITATIVE: D-Dimer, Quant: 1.42 ug/mL-FEU — ABNORMAL HIGH (ref 0.00–0.50)

## 2022-03-24 LAB — FERRITIN: Ferritin: 165 ng/mL (ref 24–336)

## 2022-03-24 LAB — BRAIN NATRIURETIC PEPTIDE: B Natriuretic Peptide: 1666.4 pg/mL — ABNORMAL HIGH (ref 0.0–100.0)

## 2022-03-24 LAB — VITAMIN B12: Vitamin B-12: 570 pg/mL (ref 180–914)

## 2022-03-24 MED ORDER — AMLODIPINE BESYLATE 10 MG PO TABS
10.0000 mg | ORAL_TABLET | Freq: Every day | ORAL | Status: DC
  Administered 2022-03-24 – 2022-03-27 (×4): 10 mg via ORAL
  Filled 2022-03-24 (×2): qty 1
  Filled 2022-03-24: qty 2
  Filled 2022-03-24: qty 1

## 2022-03-24 MED ORDER — HEPARIN SODIUM (PORCINE) 1000 UNIT/ML DIALYSIS
1000.0000 [IU] | INTRAMUSCULAR | Status: DC | PRN
  Filled 2022-03-24: qty 1

## 2022-03-24 MED ORDER — ONDANSETRON HCL 4 MG PO TABS: 4.0000 mg | ORAL_TABLET | Freq: Four times a day (QID) | ORAL | Status: DC | PRN

## 2022-03-24 MED ORDER — CLONIDINE HCL 0.1 MG PO TABS
0.1000 mg | ORAL_TABLET | Freq: Two times a day (BID) | ORAL | Status: DC
  Administered 2022-03-24 (×2): 0.1 mg via ORAL
  Filled 2022-03-24 (×2): qty 1

## 2022-03-24 MED ORDER — CHLORHEXIDINE GLUCONATE CLOTH 2 % EX PADS
6.0000 | MEDICATED_PAD | Freq: Every day | CUTANEOUS | Status: DC
  Administered 2022-03-26: 6 via TOPICAL

## 2022-03-24 MED ORDER — ALTEPLASE 2 MG IJ SOLR: 2.0000 mg | Freq: Once | INTRAMUSCULAR | Status: DC | PRN

## 2022-03-24 MED ORDER — LABETALOL HCL 5 MG/ML IV SOLN
10.0000 mg | Freq: Once | INTRAVENOUS | Status: AC
  Administered 2022-03-24: 10 mg via INTRAVENOUS
  Filled 2022-03-24: qty 4

## 2022-03-24 MED ORDER — METOPROLOL TARTRATE 25 MG PO TABS
25.0000 mg | ORAL_TABLET | Freq: Two times a day (BID) | ORAL | Status: DC
  Administered 2022-03-24: 25 mg via ORAL
  Filled 2022-03-24: qty 1

## 2022-03-24 MED ORDER — ONDANSETRON HCL 4 MG/2ML IJ SOLN
4.0000 mg | Freq: Four times a day (QID) | INTRAMUSCULAR | Status: DC | PRN
  Administered 2022-03-26: 4 mg via INTRAVENOUS

## 2022-03-24 MED ORDER — LABETALOL HCL 5 MG/ML IV SOLN
20.0000 mg | Freq: Once | INTRAVENOUS | Status: AC
  Administered 2022-03-24: 20 mg via INTRAVENOUS
  Filled 2022-03-24: qty 4

## 2022-03-24 MED ORDER — ACETAMINOPHEN 650 MG RE SUPP: 650.0000 mg | Freq: Four times a day (QID) | RECTAL | Status: DC | PRN

## 2022-03-24 MED ORDER — SODIUM CHLORIDE 0.9 % IV SOLN: 100.0000 mL | INTRAVENOUS | Status: DC | PRN

## 2022-03-24 MED ORDER — LIDOCAINE-PRILOCAINE 2.5-2.5 % EX CREA: 1.0000 "application " | TOPICAL_CREAM | CUTANEOUS | Status: DC | PRN

## 2022-03-24 MED ORDER — HYDRALAZINE HCL 20 MG/ML IJ SOLN
10.0000 mg | INTRAMUSCULAR | Status: DC | PRN
  Administered 2022-03-24 (×2): 10 mg via INTRAVENOUS
  Filled 2022-03-24 (×2): qty 1

## 2022-03-24 MED ORDER — PENTAFLUOROPROP-TETRAFLUOROETH EX AERO: 1.0000 "application " | INHALATION_SPRAY | CUTANEOUS | Status: DC | PRN

## 2022-03-24 MED ORDER — FUROSEMIDE 10 MG/ML IJ SOLN
20.0000 mg | Freq: Once | INTRAMUSCULAR | Status: AC
  Administered 2022-03-24: 20 mg via INTRAVENOUS
  Filled 2022-03-24: qty 2

## 2022-03-24 MED ORDER — LIDOCAINE HCL (PF) 1 % IJ SOLN: 5.0000 mL | INTRAMUSCULAR | Status: DC | PRN

## 2022-03-24 MED ORDER — FENTANYL CITRATE PF 50 MCG/ML IJ SOSY
50.0000 ug | PREFILLED_SYRINGE | Freq: Once | INTRAMUSCULAR | Status: AC
  Administered 2022-03-24: 50 ug via INTRAVENOUS
  Filled 2022-03-24: qty 1

## 2022-03-24 MED ORDER — LABETALOL HCL 5 MG/ML IV SOLN
10.0000 mg | INTRAVENOUS | Status: DC | PRN
  Administered 2022-03-24 (×3): 10 mg via INTRAVENOUS
  Filled 2022-03-24 (×3): qty 4

## 2022-03-24 MED ORDER — ACETAMINOPHEN 325 MG PO TABS
650.0000 mg | ORAL_TABLET | Freq: Four times a day (QID) | ORAL | Status: DC | PRN
Start: 2022-03-24 — End: 2022-03-27
  Administered 2022-03-24 (×2): 650 mg via ORAL
  Filled 2022-03-24 (×2): qty 2

## 2022-03-24 MED ORDER — ACETAMINOPHEN 325 MG PO TABS: 650.0000 mg | ORAL_TABLET | Freq: Four times a day (QID) | ORAL | Status: DC | PRN

## 2022-03-24 MED ORDER — SODIUM CHLORIDE 0.9% FLUSH
3.0000 mL | Freq: Two times a day (BID) | INTRAVENOUS | Status: DC
  Administered 2022-03-24 – 2022-03-26 (×5): 3 mL via INTRAVENOUS

## 2022-03-24 MED ORDER — FUROSEMIDE 10 MG/ML IJ SOLN
80.0000 mg | Freq: Once | INTRAMUSCULAR | Status: AC
  Administered 2022-03-24: 80 mg via INTRAVENOUS
  Filled 2022-03-24: qty 8

## 2022-03-24 MED ORDER — NICOTINE 21 MG/24HR TD PT24
21.0000 mg | MEDICATED_PATCH | Freq: Every day | TRANSDERMAL | Status: DC
  Administered 2022-03-24 – 2022-03-25 (×2): 21 mg via TRANSDERMAL
  Filled 2022-03-24 (×3): qty 1

## 2022-03-24 NOTE — Progress Notes (Signed)
  Carryover admission to the Day Admitter.  I discussed this case with the EDP, Silverio Decamp, PA.  Per these discussions:  This is a 46 year old male with history of hypertension, who is being admitted for tensive crisis as well as acute renal failure after presenting with 2 to 3 days of shortness of breath.   A reportedly had a hospitalization in April 2022 for very similar, resulting in admission for hypertensive crisis as well as acute renal failure which improved with medical management, without any requirement for temporary hemodialysis.  He reports that he is lost his job over the last year or so, and therefore his medical insurance as well.  As a result, he has been unable to afford any of his antihypertensive medications over the course of the last 13 months.   Creatinine today noted to be 8.77 compared to most recent prior value of 3.12 in April 2022.  Not associate with any hyperkalemia.  Chest x-ray reportedly shows potential increase in vascular congestion.  He is maintaining oxygen saturations in the mid 90s on room air, and continues to make urine.  Has responded well to dose of IV Lasix in the ED.  Initial systolic blood pressure noted to be in the 230s, with improvement into the 170s following multiple doses of IV labetalol as well as initiation of IV diuresis efforts, as above.  EDP discussed patient's case with on-call nephrology, Dr. Laymond Purser, Who confirmed no current need for urgent hemodialysis, and stated that nephrology would be available as needed.   I have placed an order for inpatient admission for further evaluation management of acute renal failure in the setting of hypertensive crisis.    I have placed some additional preliminary admit orders via the adult multi-morbid admission order set. I have also placed order for prn IV labetalol.  I have not ordered any additional Lasix at this time.    Babs Bertin, DO Hospitalist

## 2022-03-24 NOTE — ED Notes (Signed)
Patient do not want lab drawn at this time.

## 2022-03-24 NOTE — Consult Note (Addendum)
Reason for Consult: Renal failure Referring Physician: Dr. Velia Meyer  Chief Complaint: Hypertensive crisis  Assessment/Plan: Acute on CKD IV - Kidney biopsy in 05/2019 at [facility] c/w diabetic nephropathy with nodular glomerulosclerosis with moderate interstitial fibrosis and  tubular atrophy with no e/o immune complex disease. He almost certainly has had progression of kidney disease and question now is how advanced is the chronic component now.  - Will try to get his bp under better control as well as restart on anti-hypertensive regimen and see where his creatinine settles in at. But with the the h/o  already moderate interstitial fibrosis + tubular atrophy in 2020 + no medical treatment for over a year + anemia + anorexia for months and now nausea over past week all point to his renal disease progressing to ESRD.  I am anticipating initiating dialysis during this hospitalization unless renal function improves significantly (I am not anticipating this). His cr was 3.1 02/2021 but very possible he may have progressed with no medical treatment since then. Also no labs since 02/2021.  - Will consult [facility] for a TC + AV access for dialysis + initiate dialysis once TC is in place.  -Monitor Daily I/Os, Daily weight  -Avoid nephrotoxic medications including NSAIDs, contrast if at all possible. -PVR to ensure no obstructive uropathy; renal ultrasound to check size of kidneys/ cortical thickness and r/o obstruction.  Hypertensive emergency  - would restart Amlodipine 60m daily, metoprolol + diuretics; no ACEI/ARB given his level of renal compromise. - Goal BP 30% decrease in 1st 24hrs to maintain adequate vital organ perfusion; if oral medications are inadequate then will need to start IV nicardipine or cleviprex gtt.  Renal osteodystrophy - will check a phos for binder management Anemia - Will dose IV iron or ESA once on hd. DM - A1c previously was 13 in 2020. Per primary. HLD  HPI: Levi Wilmeris an  46y.o. male with a history  of uncontrolled DM and HTN who presents for evaluation of CKD.  He also has a history of not taking his medications regularly due to cost and has been out of medications for periods of 3 mths previously.  He has had DM for about 13 years.  He has been on insulin around 8 years and also has h/o not taking insulin regularly due to cost.  He is presenting with hypertensive crisis and acute renal failure with a similar hospitalization in 02/2021. He is uninsured after losing his job and unable to afford any of his antihypertensive medications over the past 13 mths. Creatinine noted to be 8.77 up from 3.1-3.6 April of 2022. Appears to have an episode of acute on chronic kidney disease in April 2022 with improvement from 3.64 -> 3.12 over a course of days when he presented with hypertensive crisis.  In the ED CXR shows vascular congestion and he was given Lasix 257m in the ED. Systolic BP noted to be in the 230's treated with IV Labetalol pushes and IV diuresis.  No issues with dizziness.  Does have a history of ibuprofen and meloxicam in 2019 per charting.   No difficulty with urination.  No history of kidney stones.  No family history of CKD.   ROS Pertinent items are noted in HPI.  Chemistry and CBC: Creatinine, Ser  Date/Time Value Ref Range Status  03/24/2022 02:31 AM 8.77 (H) 0.61 - 1.24 mg/dL Final  03/02/2021 11:29 AM 3.12 (H) 0.61 - 1.24 mg/dL Final  03/02/2021 12:21 AM 3.31 (H) 0.61 -  1.24 mg/dL Final  03/01/2021 11:35 AM 3.64 (H) 0.61 - 1.24 mg/dL Final  02/27/2021 03:18 PM 3.56 (H) 0.40 - 1.50 mg/dL Final  09/21/2019 09:16 AM 1.82 (H) 0.40 - 1.50 mg/dL Final  03/11/2019 09:35 AM 1.87 (H) 0.40 - 1.50 mg/dL Final  08/27/2018 09:44 AM 1.52 (H) 0.40 - 1.50 mg/dL Final  09/01/2017 12:09 PM 1.38 0.40 - 1.50 mg/dL Final  03/06/2017 10:00 AM 1.40 0.40 - 1.50 mg/dL Final  01/05/2017 02:11 PM 1.33 0.40 - 1.50 mg/dL Final   Recent Labs  Lab 03/24/22 0231  NA 141   K 4.4  CL 114*  CO2 18*  GLUCOSE 104*  BUN 67*  CREATININE 8.77*  CALCIUM 7.7*   Recent Labs  Lab 03/24/22 0231  WBC 7.0  NEUTROABS 4.3  HGB 7.9*  HCT 25.8*  MCV 87.2  PLT 382   Liver Function Tests: Recent Labs  Lab 03/24/22 0231  AST 50*  ALT 47*  ALKPHOS 162*  BILITOT 0.8  PROT 6.8  ALBUMIN 3.3*   No results for input(s): LIPASE, AMYLASE in the last 168 hours. No results for input(s): AMMONIA in the last 168 hours. Cardiac Enzymes: No results for input(s): CKTOTAL, CKMB, CKMBINDEX, TROPONINI in the last 168 hours. Iron Studies:  Recent Labs    03/24/22 0921  IRON 31*  TIBC 267  FERRITIN 165   PT/INR: _0 (inr:5)  Xrays/Other Studies: ) Results for orders placed or performed during the hospital encounter of 03/24/22 (from the past 48 hour(s))  CBC with Differential     Status: Abnormal   Collection Time: 03/24/22  2:31 AM  Result Value Ref Range   WBC 7.0 4.0 - 10.5 K/uL   RBC 2.96 (L) 4.22 - 5.81 MIL/uL   Hemoglobin 7.9 (L) 13.0 - 17.0 g/dL   HCT 25.8 (L) 39.0 - 52.0 %   MCV 87.2 80.0 - 100.0 fL   MCH 26.7 26.0 - 34.0 pg   MCHC 30.6 30.0 - 36.0 g/dL   RDW 16.2 (H) 11.5 - 15.5 %   Platelets 382 150 - 400 K/uL   nRBC 0.0 0.0 - 0.2 %   Neutrophils Relative % 61 %   Neutro Abs 4.3 1.7 - 7.7 K/uL   Lymphocytes Relative 18 %   Lymphs Abs 1.3 0.7 - 4.0 K/uL   Monocytes Relative 11 %   Monocytes Absolute 0.8 0.1 - 1.0 K/uL   Eosinophils Relative 9 %   Eosinophils Absolute 0.6 (H) 0.0 - 0.5 K/uL   Basophils Relative 1 %   Basophils Absolute 0.1 0.0 - 0.1 K/uL   Immature Granulocytes 0 %   Abs Immature Granulocytes 0.01 0.00 - 0.07 K/uL    Comment: Performed at [facility], [address] [place], [place] [address]  Comprehensive metabolic panel     Status: Abnormal   Collection Time: 03/24/22  2:31 AM  Result Value Ref Range   Sodium 141 135 - 145 mmol/L   Potassium 4.4 3.5 - 5.1 mmol/L   Chloride 114 (H) 98 - 111 mmol/L   CO2  18 (L) 22 - 32 mmol/L   Glucose, Bld 104 (H) 70 - 99 mg/dL    Comment: Glucose reference range applies only to samples taken after fasting for at least 8 hours.   BUN 67 (H) 6 - 20 mg/dL   Creatinine, Ser 8.77 (H) 0.61 - 1.24 mg/dL   Calcium 7.7 (L) 8.9 - 10.3 mg/dL   Total Protein 6.8 6.5 - 8.1 g/dL  Albumin 3.3 (L) 3.5 - 5.0 g/dL   AST 50 (H) 15 - 41 U/L   ALT 47 (H) 0 - 44 U/L   Alkaline Phosphatase 162 (H) 38 - 126 U/L   Total Bilirubin 0.8 0.3 - 1.2 mg/dL   GFR, Estimated 7 (L) >60 mL/min    Comment: (NOTE) Calculated using the CKD-EPI Creatinine Equation (2021)    Anion gap 9 5 - 15    Comment: Performed at [facility] [address], [place], [place] [address]  Brain natriuretic peptide     Status: Abnormal   Collection Time: 03/24/22  2:31 AM  Result Value Ref Range   B Natriuretic Peptide 1,666.4 (H) 0.0 - 100.0 pg/mL    Comment: Performed at [facility] [address] [place], [place] [address]  Troponin I (High Sensitivity)     Status: Abnormal   Collection Time: 03/24/22  3:22 AM  Result Value Ref Range   Troponin I (High Sensitivity) 47 (H) <18 ng/L    Comment: (NOTE) Elevated high sensitivity troponin I (hsTnI) values and significant  changes across serial measurements may suggest ACS but many other  chronic and acute conditions are known to elevate hsTnI results.  Refer to the "Links" section for chest pain algorithms and additional  guidance. Performed at [facility], [place] [address], [place], [place] [address]   Urinalysis, Routine w reflex microscopic Urine, Clean Catch     Status: Abnormal   Collection Time: 03/24/22  3:55 AM  Result Value Ref Range   Color, Urine YELLOW YELLOW   APPearance CLEAR CLEAR   Specific Gravity, Urine 1.011 1.005 - 1.030   pH 5.0 5.0 - 8.0   Glucose, UA NEGATIVE NEGATIVE mg/dL   Hgb urine dipstick MODERATE (A) NEGATIVE   Bilirubin Urine NEGATIVE NEGATIVE   Ketones, ur NEGATIVE NEGATIVE mg/dL    Protein, ur 100 (A) NEGATIVE mg/dL   Nitrite NEGATIVE NEGATIVE   Leukocytes,Ua NEGATIVE NEGATIVE   RBC / HPF 6-10 0 - 5 RBC/hpf   WBC, UA 0-5 0 - 5 WBC/hpf   Bacteria, UA NONE SEEN NONE SEEN   Squamous Epithelial / LPF 0-5 0 - 5    Comment: Performed at [facility], [place] [address], [place], [place] [address]  POC CBG, ED     Status: Abnormal   Collection Time: 03/24/22  6:09 AM  Result Value Ref Range   Glucose-Capillary 106 (H) 70 - 99 mg/dL    Comment: Glucose reference range applies only to samples taken after fasting for at least 8 hours.  Troponin I (High Sensitivity)     Status: Abnormal   Collection Time: 03/24/22  6:10 AM  Result Value Ref Range   Troponin I (High Sensitivity) 46 (H) <18 ng/L    Comment: (NOTE) Elevated high sensitivity troponin I (hsTnI) values and significant  changes across serial measurements may suggest ACS but many other  chronic and acute conditions are known to elevate hsTnI results.  Refer to the "Links" section for chest pain algorithms and additional  guidance. Performed at [facility], [place] [address], [place], [place] [address]   Vitamin B12     Status: None   Collection Time: 03/24/22  9:21 AM  Result Value Ref Range   Vitamin B-12 570 180 - 914 pg/mL    Comment: (NOTE) This assay is not validated for testing neonatal or myeloproliferative syndrome specimens for Vitamin B12 levels. Performed at [facility], [place] [address] [place], [place] [address]   Folate     Status: None   Collection Time: 03/24/22  9:21 AM  Result Value Ref Range   Folate 8.5 >5.9 ng/mL    Comment: Performed at [facility] [address] [place], [place] [address]  Iron and TIBC     Status: Abnormal   Collection Time: 03/24/22  9:21 AM  Result Value Ref Range   Iron 31 (L) 45 - 182 ug/dL   TIBC 267 250 - 450 ug/dL   Saturation Ratios 12 (L) 17.9 - 39.5 %   UIBC 236 ug/dL    Comment: Performed at [facility], [place]  [address], [place], [place] [address]  Ferritin     Status: None   Collection Time: 03/24/22  9:21 AM  Result Value Ref Range   Ferritin 165 24 - 336 ng/mL    Comment: Performed at [facility] [address] [place], [place] [address]  Reticulocytes     Status: Abnormal   Collection Time: 03/24/22  9:21 AM  Result Value Ref Range   Retic Ct Pct 2.4 0.4 - 3.1 %   RBC. 2.63 (L) 4.22 - 5.81 MIL/uL   Retic Count, Absolute 63.4 19.0 - 186.0 K/uL   Immature Retic Fract 19.7 (H) 2.3 - 15.9 %    Comment: Performed at [facility] [address] [place], [place] [address]  Creatinine, urine, random     Status: None   Collection Time: 03/24/22  9:21 AM  Result Value Ref Range   Creatinine, Urine 45.36 mg/dL    Comment: Performed at [facility], [place] [address] [place], [place] [address]  Sodium, urine, random     Status: None   Collection Time: 03/24/22  9:21 AM  Result Value Ref Range   Sodium, Ur 110 mmol/L    Comment: Performed at [facility] [address] [place], [place] [address]  Protime-INR     Status: None   Collection Time: 03/24/22  9:21 AM  Result Value Ref Range   Prothrombin Time 14.5 11.4 - 15.2 seconds   INR 1.1 0.8 - 1.2    Comment: (NOTE) INR goal varies based on device and disease states. Performed at [facility], [place] [address], [place], [place] [address]   APTT     Status: None   Collection Time: 03/24/22  9:21 AM  Result Value Ref Range   aPTT 30 24 - 36 seconds    Comment: Performed at [facility] [address], [place], [place] [address]  Type and screen [place]     Status: None   Collection Time: 03/24/22  9:21 AM  Result Value Ref Range   ABO/RH(D) O POS    Antibody Screen NEG    Sample Expiration      03/27/2022,2359 Performed at [facility], [place] [address], [place], [place] [address]   ABO/Rh     Status: None (Preliminary result)   Collection Time: 03/24/22 12:22 PM  Result Value  Ref Range   ABO/RH(D) PENDING    DG Chest 2 View  Result Date: 03/24/2022 CLINICAL DATA:  Shortness of breath. EXAM: CHEST - 2 VIEW COMPARISON:  Chest radiograph dated 03/01/2021. FINDINGS: Mild diffuse chronic interstitial coarsening. No focal consolidation, pleural effusion, pneumothorax. Faint 7 mm nodular density is in the right lower lung field may be artifactual or  related to nipple shadow. A pulmonary nodule is not excluded. Attention on follow-up imaging recommended. Top-normal cardiac silhouette. No acute osseous pathology. IMPRESSION: 1. No acute cardiopulmonary process. 2. Faint 7 mm nodular density in the right lower lung field. Attention on follow-up imaging recommended. Electronically Signed   By: Anner Crete M.D.   On: 03/24/2022 03:06    PMH:   Past Medical History:  Diagnosis Date   Diabetes mellitus without complication ([place])    Hypertension    Renal disorder     PSH:  History reviewed. No pertinent surgical history.  Allergies: No Known Allergies  Medications:   Prior to Admission medications   Medication Sig Start Date End Date Taking? Authorizing Provider  acetaminophen (TYLENOL) 325 MG tablet Take 650 mg by mouth every 6 (six) hours as needed for headache. Patient not taking: Reported on 03/24/2022    [provider]  amLODipine (NORVASC) 10 MG tablet TAKE 1 TABLET(10 MG) BY MOUTH DAILY Patient not taking: Reported on 03/24/2022 03/02/21   Cristal Ford, DO  Blood Glucose Monitoring Suppl ([place]) w/Device KIT 1 Device by Does not apply route as directed. 03/21/19   [name], Melanie Crazier, MD  cloNIDine (CATAPRES) 0.1 MG tablet Take 1 tablet (0.1 mg total) by mouth 2 (two) times daily. Patient not taking: Reported on 03/24/2022 03/02/21 05/31/21  Cristal Ford, DO  glimepiride (AMARYL) 1 MG tablet Take 1 tablet (1 mg total) by mouth daily with breakfast. Patient not taking: Reported on 03/24/2022 03/02/21   Cristal Ford, DO   glucose blood (ONETOUCH VERIO) test strip 4x daily 03/21/19   [name], Melanie Crazier, MD  Insulin Pen Needle (PEN NEEDLES) 31G X 5 MM MISC 1 Package by Does not apply route daily. 04/15/19   [name], Melanie Crazier, MD  metoprolol tartrate (LOPRESSOR) 25 MG tablet Take 1 tablet (25 mg total) by mouth 2 (two) times daily. Patient not taking: Reported on 03/24/2022 03/02/21 05/31/21  Cristal Ford, DO  simvastatin (ZOCOR) 20 MG tablet TAKE 1 TABLET(20 MG) BY MOUTH AT BEDTIME Patient not taking: Reported on 03/24/2022 03/02/21   Cristal Ford, DO    Discontinued Meds:   Medications Discontinued During This Encounter  Medication Reason   acetaminophen (TYLENOL) tablet 650 mg    acetaminophen (TYLENOL) suppository 650 mg     Social History:  reports that he has been smoking cigarettes. He has been smoking an average of 1 pack per day. He has never used smokeless tobacco. He reports that he does not drink alcohol and does not use drugs.  Family History:   Family History  Problem Relation Age of Onset   Diabetes Maternal Grandfather     Blood pressure (!) 193/114, pulse 74, temperature 98.4 F (36.9 C), resp. rate 19, height _0  (1.753 m), weight 84 kg, SpO2 98 %. Gen - Adult male seated in NAD; well-nourished HEENT - NCAT/EOMI; sclera anicteric Neck - supple; no JVD Cardiovascular - RRR; no m/r/g; no edema Chest/Lungs - clear to auscultation; normal work of breathing Abdomen - soft/NT/ND MSK grossly normal bulk and tone Neuro no focal deficits on gross exam; ambulates without assistance Psych normal mood and affect Skin - no rash; no lesions on exposed areas       Dwana Melena, MD 03/24/2022, 12:53 PM

## 2022-03-24 NOTE — ED Provider Notes (Signed)
[facility] EMERGENCY DEPARTMENT Provider Note   CSN: [id number] Arrival date & time: 03/24/22  0211     History  Chief Complaint  Patient presents with   SOB / Legs Swelling    Hypertensive    Levi Hodges is a 46 y.o. male  with history of hypertension, hyperlipidemia and CKD stage III secondary to type 2 diabetes who presents with concern for shortness of breath and bilateral lower extremity swelling for the last month with worsening dry cough, fatigue, and pain in his legs.  Patient was admitted in April 2022 for acute renal failure.  He states that he lost his job during [place] and had not had insurance therefore has not had any antihypertensive medications since that admission.  He states he is urinating a normal amount and not having any chest pains.  Exquisitely hypertensive on intake.  Level 5 caveat due to acuity of presentation upon arrival. NOT taking any medications daily.   HPI     Home Medications Prior to Admission medications   Medication Sig Start Date End Date Taking? Authorizing Provider  acetaminophen (TYLENOL) 325 MG tablet Take 650 mg by mouth every 6 (six) hours as needed for headache.    [provider]  amLODipine (NORVASC) 10 MG tablet TAKE 1 TABLET(10 MG) BY MOUTH DAILY 03/02/21   Cristal Ford, DO  Blood Glucose Monitoring Suppl ([place]) w/Device KIT 1 Device by Does not apply route as directed. 03/21/19   [name], Melanie Crazier, MD  cloNIDine (CATAPRES) 0.1 MG tablet Take 1 tablet (0.1 mg total) by mouth 2 (two) times daily. 03/02/21 05/31/21  Cristal Ford, DO  glimepiride (AMARYL) 1 MG tablet Take 1 tablet (1 mg total) by mouth daily with breakfast. 03/02/21   Cristal Ford, DO  glucose blood (ONETOUCH VERIO) test strip 4x daily 03/21/19   [name], Melanie Crazier, MD  Insulin Pen Needle (PEN NEEDLES) 31G X 5 MM MISC 1 Package by Does not apply route daily. 04/15/19   [name], Melanie Crazier, MD  metoprolol tartrate (LOPRESSOR) 25 MG tablet Take 1 tablet (25 mg total) by mouth 2 (two) times daily. 03/02/21 05/31/21  Cristal Ford, DO  simvastatin (ZOCOR) 20 MG tablet TAKE 1 TABLET(20 MG) BY MOUTH AT BEDTIME 03/02/21   Cristal Ford, DO      Allergies    Patient has no known allergies.    Review of Systems   Review of Systems  Constitutional:  Positive for fatigue.  HENT: Negative.    Eyes: Negative.   Respiratory:  Positive for cough, chest tightness and shortness of breath.   Cardiovascular:  Positive for leg swelling. Negative for chest pain and palpitations.  Gastrointestinal: Negative.   Genitourinary: Negative.  Negative for decreased urine volume and hematuria.  Neurological:  Positive for weakness and headaches. Negative for dizziness and light-headedness.   Physical Exam Updated Vital Signs BP (!) 174/103   Pulse 73   Temp 98.4 F (36.9 C)   Resp 13   Ht _0  (1.753 m)   Wt 84 kg   SpO2 100%   BMI 27.35 kg/m  Physical Exam Vitals and nursing note reviewed.  Constitutional:      Appearance: He is not ill-appearing or toxic-appearing.  HENT:     Head: Normocephalic and atraumatic.     Nose: Nose normal.     Mouth/Throat:     Mouth: Mucous membranes are moist.     Pharynx: No oropharyngeal exudate or posterior oropharyngeal erythema.  Eyes:     General:        Right eye: No discharge.        Left eye: No discharge.     Conjunctiva/sclera: Conjunctivae normal.  Cardiovascular:     Rate and Rhythm: Normal rate and regular rhythm.     Pulses: Normal pulses.     Heart sounds: Normal heart sounds. No murmur heard.    Comments: Edema extends up the legs to the thighs.  JVD  Pulmonary:     Effort: Pulmonary effort is normal. No tachypnea, bradypnea or respiratory distress.     Breath sounds: Examination of the right-middle field reveals rales. Rales present. No wheezing.  Chest:     Chest wall: No mass, lacerations, deformity, swelling,  tenderness, crepitus or edema.  Abdominal:     General: Bowel sounds are normal. There is no distension.     Palpations: Abdomen is soft.     Tenderness: There is no abdominal tenderness. There is no right CVA tenderness, left CVA tenderness, guarding or rebound.  Musculoskeletal:        General: No deformity.     Cervical back: Neck supple.     Right lower leg: 2+ Pitting Edema present.     Left lower leg: 2+ Pitting Edema present.  Skin:    General: Skin is warm and dry.     Capillary Refill: Capillary refill takes less than 2 seconds.  Neurological:     General: No focal deficit present.     Mental Status: He is alert and oriented to person, place, and time. Mental status is at baseline.  Psychiatric:        Mood and Affect: Mood normal.    ED Results / Procedures / Treatments   Labs (all labs ordered are listed, but only abnormal results are displayed) Labs Reviewed  CBC WITH DIFFERENTIAL/PLATELET - Abnormal; Notable for the following components:      Result Value   RBC 2.96 (*)    Hemoglobin 7.9 (*)    HCT 25.8 (*)    RDW 16.2 (*)    Eosinophils Absolute 0.6 (*)    All other components within normal limits  COMPREHENSIVE METABOLIC PANEL - Abnormal; Notable for the following components:   Chloride 114 (*)    CO2 18 (*)    Glucose, Bld 104 (*)    BUN 67 (*)    Creatinine, Ser 8.77 (*)    Calcium 7.7 (*)    Albumin 3.3 (*)    AST 50 (*)    ALT 47 (*)    Alkaline Phosphatase 162 (*)    GFR, Estimated 7 (*)    All other components within normal limits  BRAIN NATRIURETIC PEPTIDE - Abnormal; Notable for the following components:   B Natriuretic Peptide 1,666.4 (*)    All other components within normal limits  URINALYSIS, ROUTINE W REFLEX MICROSCOPIC - Abnormal; Notable for the following components:   Hgb urine dipstick MODERATE (*)    Protein, ur 100 (*)    All other components within normal limits  CBG MONITORING, ED - Abnormal; Notable for the following components:    Glucose-Capillary 106 (*)    All other components within normal limits  TROPONIN I (HIGH SENSITIVITY) - Abnormal; Notable for the following components:   Troponin I (High Sensitivity) 47 (*)    All other components within normal limits  TROPONIN I (HIGH SENSITIVITY)    EKG None  Radiology DG Chest 2 View  Result Date: 03/24/2022  CLINICAL DATA:  Shortness of breath. EXAM: CHEST - 2 VIEW COMPARISON:  Chest radiograph dated 03/01/2021. FINDINGS: Mild diffuse chronic interstitial coarsening. No focal consolidation, pleural effusion, pneumothorax. Faint 7 mm nodular density is in the right lower lung field may be artifactual or related to nipple shadow. A pulmonary nodule is not excluded. Attention on follow-up imaging recommended. Top-normal cardiac silhouette. No acute osseous pathology. IMPRESSION: 1. No acute cardiopulmonary process. 2. Faint 7 mm nodular density in the right lower lung field. Attention on follow-up imaging recommended. Electronically Signed   By: Anner Crete M.D.   On: 03/24/2022 03:06    Procedures .Critical Care Performed by: Emeline Darling, PA-C Authorized by: Emeline Darling, PA-C   Critical care provider statement:    Critical care time (minutes):  45   Critical care was necessary to treat or prevent imminent or life-threatening deterioration of the following conditions:  Renal failure (Hypertensive emergency)   Critical care was time spent personally by me on the following activities:  Development of treatment plan with patient or surrogate, discussions with consultants, evaluation of patient's response to treatment, examination of patient, obtaining history from patient or surrogate, ordering and performing treatments and interventions, ordering and review of laboratory studies, ordering and review of radiographic studies, pulse oximetry and re-evaluation of patient's condition    Medications Ordered in ED Medications  labetalol (NORMODYNE)  injection 10 mg (10 mg Intravenous Given 03/24/22 0352)  furosemide (LASIX) injection 20 mg (20 mg Intravenous Given 03/24/22 0535)  fentaNYL (SUBLIMAZE) injection 50 mcg (50 mcg Intravenous Given 03/24/22 0534)  labetalol (NORMODYNE) injection 20 mg (20 mg Intravenous Given 03/24/22 0538)    ED Course/ Medical Decision Making/ A&P Clinical Course as of 03/24/22 9628  Mon Mar 24, 2022  0300 Consult to nephrologist, Dr. Melvia Heaps who agrees with diuresis, reconsult nephrology in the inpatient setting. No indication for emergent dialysis. I appreciate his collaboration in the care of this patient.  [RS]  [phone] Consult to hospitalist, Dr. Velia Meyer, who is agreeable to admitting this patient to his service. I appreciate his collaboration in the care of this patient.  [RS]    Clinical Course User Index [RS] Aura Dials                           Medical Decision Making 46 year old male presents with concern for worsening bilateral lower extremity swelling and shortness of breath for the last month.  Has been without his antihypertensive medication for nearly 2 years.  Exquisitely hypertensive 238/131 on intake.  Vital signs otherwise normal.  Cardiac exam is unremarkable, pulmonary exam with rales in the middle lobes.  Bilateral lower extremity pitting edema up to the thighs.  Abdominal exam is benign.  Differential gnosis includes but is not limited to ACS, PE, pleural effusion, pneumothorax, hypertensive emergency.    Amount and/or Complexity of Data Reviewed Labs: ordered.    Details: CBC with anemia with hemoglobin of 7.9 which is new for this patient.  CMP with acute renal failure with creatinine of 8.7, baseline near 3 transaminitis with AST/ALT 50/47.  Potassium is normal at 4.4.  UA with hemoglobinuria and proteinuria, BNP significantly elevated to 1600 and troponin elevated to 47. Radiology: ordered.    Details: Chest x-ray with pulmonary vascular congestion on my  interpretation other acute cardiopulmonary abnormality per radiologist.  Images visualized by this provider. ECG/medicine tests: independent interpretation performed.    Details: EKG with normal sinus  rhythm no STEMI.  Risk Prescription drug management. Decision regarding hospitalization.   Patient urinating well with diuresis with Lasix, responding well with decrease in blood pressure after second dose of labetalol with MAP now in the 773P and systolic pressure in the 366K.  Feel we will be able to avoid IV drip for hypertension at this time. Improvement in patient's SOB, resolution of chest tightness.  Extensive discussion with the patient and his wife regarding his diagnosis of hypertensive emergency with acute renal failure.  Do feel patient will require admission to the hospital for further stabilization.  Levi Hodges and his wife voiced understanding of his medical evaluation and treatment plan.  Each of their questions answered to their expressed satisfaction.  They are amenable to plan for admission at this time.   Consult to hospitalist as above. Patient remains well appearing at this time.   This chart was dictated using voice recognition software, Dragon. Despite the best efforts of this provider to proofread and correct errors, errors may still occur which can change documentation meaning.  Final Clinical Impression(s) / ED Diagnoses Final diagnoses:  Hypertensive emergency    Rx / DC Orders ED Discharge Orders     None         Aura Dials 03/24/22 1594    Mesner, Corene Cornea, MD 03/24/22 [phone]

## 2022-03-24 NOTE — H&P (Addendum)
History and Physical    Patient: Levi Hodges ASU:[id number] DOB: 1976/07/12 DOA: 03/24/2022 DOS: the patient was seen and examined on 03/24/2022 PCP: Hoyt Koch, MD  Patient coming from: Home  Chief Complaint:  Chief Complaint  Patient presents with   SOB / Legs Swelling    Hypertensive   HPI: Levi Hodges is a 46 y.o. male with medical history significant of hypertension, diabetes mellitus type 2,  chronic kidney disease stage IV, and tobacco abuse who presents with complaints of progressively worsening swelling of his legs and shortness of breath over the last month.  He was last hospitalized in 02/2021 for acute renal failure superimposed on chronic kidney disease which point creatinine was noted to be around 3.12.  The patient had lost to job and healthcare insurance during the pandemic and had been taking temporary jobs since then.  He had reportedly filled out paperwork for uninsured care through [facility], but had never heard anything back.  After that hospitalization patient had medication available for 1 month.  He was unable to follow-up with anyone due to the lack of insurance.  Over the last month he has had worsening leg swelling, intermittent dry cough, short of breath with any kind of exertion, intermittent jerking of his extremities, increased fatigue/lethargy, and chest pains.  Patient also reports intermittent headaches and blurry vision.  The jerking movements were initially thought to be possibly a seizure, but patient never reported any loss of consciousness.  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  His healthcare insurance will not kick in June 1, and he was set up to follow-up with Dr. Royce Macadamia on June 27.  Last night he reported having loss of feeling in his legs which was new.  Patient does admit to smoking a pack cigarettes per day on average and has done  so for approximately 30 years.  He denies having any recent fevers, chills, weight gain, nausea, vomiting, or diarrhea symptoms.  Upon admission to the emergency department patient was seen to be afebrile with blood pressures elevated up to 238/133, and all other vital signs relatively maintained.  Labs significant for hemoglobin 7.9 CO2 18, BUN 67, creatinine 8.77, calcium 7.7, alkaline phosphatase 167, albumin 3.3, AST 50, ALT 47, BNP 1666.4, and troponin 47->46.  Chest x-ray head noted no acute cardiopulmonary process with faint 7 mm nodular density of the right lower lobe lung field.  Urinalysis noted moderate hemoglobin, 100 protein, and 6-10 RBCs/hpf.  Patient having given a total of 30 mg of labetalol IV, 20 mg of Lasix IV, and fentanyl   Review of Systems: As mentioned in the history of present illness. All other systems reviewed and are negative. Past Medical History:  Diagnosis Date   Diabetes mellitus without complication ([place])    Hypertension    Renal disorder    History reviewed. No pertinent surgical history. Social History:  reports that he has been smoking cigarettes. He has been smoking an average of 1 pack per day. He has never used smokeless tobacco. He reports that he does not drink alcohol and does not use drugs.  No Known Allergies  Family History  Problem Relation Age of Onset   Diabetes Maternal Grandfather     Prior to Admission medications   Medication Sig Start Date End Date Taking? Authorizing Provider  acetaminophen (TYLENOL) 325 MG tablet Take 650 mg by mouth every 6 (  six) hours as needed for headache.    [provider]  amLODipine (NORVASC) 10 MG tablet TAKE 1 TABLET(10 MG) BY MOUTH DAILY 03/02/21   Cristal Ford, DO  Blood Glucose Monitoring Suppl ([place]) w/Device KIT 1 Device by Does not apply route as directed. 03/21/19   [name], Melanie Crazier, MD  cloNIDine (CATAPRES) 0.1 MG tablet Take 1 tablet (0.1 mg total) by  mouth 2 (two) times daily. 03/02/21 05/31/21  Cristal Ford, DO  glimepiride (AMARYL) 1 MG tablet Take 1 tablet (1 mg total) by mouth daily with breakfast. 03/02/21   Cristal Ford, DO  glucose blood (ONETOUCH VERIO) test strip 4x daily 03/21/19   [name], Melanie Crazier, MD  Insulin Pen Needle (PEN NEEDLES) 31G X 5 MM MISC 1 Package by Does not apply route daily. 04/15/19   [name], Melanie Crazier, MD  metoprolol tartrate (LOPRESSOR) 25 MG tablet Take 1 tablet (25 mg total) by mouth 2 (two) times daily. 03/02/21 05/31/21  Cristal Ford, DO  simvastatin (ZOCOR) 20 MG tablet TAKE 1 TABLET(20 MG) BY MOUTH AT BEDTIME 03/02/21   Cristal Ford, DO    Physical Exam: Vitals:   03/24/22 0700 03/24/22 0730 03/24/22 0800 03/24/22 0815  BP: (!) 169/91 (!) 161/101 (!) 173/94 (!) 184/96  Pulse: 73 77 71 73  Resp: '18 20 17 17  ' Temp:      SpO2: 100% 96% 95% 96%  Weight:      Height:       Exam  Constitutional: Middle-age male currently in no acute distress Eyes: PERRL, lids and conjunctivae normal ENMT: Mucous membranes are moist. Posterior pharynx clear of any exudate or lesions.   Neck: normal and supple. Respiratory: Normal respiratory effort without significant wheezes or rhonchi appreciated. Cardiovascular: Regular rate and rhythm, no murmurs / rubs / gallops.  2+ pitting lower extremity edema.   Abdomen: no tenderness, no masses palpated.  Bowel sounds positive.  Musculoskeletal: no clubbing / cyanosis. No joint deformity upper and lower extremities. Good ROM, no contractures. Normal muscle tone.  Skin: no rashes, lesions, ulcers. No induration Neurologic: CN 2-12 grossly intact. Strength 5/5 in all 4.  Psychiatric: Normal judgment and insight. Alert and oriented x 3. Normal mood.   Data Reviewed:  EKG revealed normal sinus rhythm at 88 bpm with a lateral atrial enlargement and right axis deviation.  Assessment and Plan: Fluid overload secondary to acute renal failure  superimposed on chronic kidney disease stage IV.   Patient presents with complaints of progressive worsening lower extremity swelling with reports of intermittent jerking.  Labs significant for creatinine 8.77, BUN 67 potassium 4.4, and BNP 1666.4.  Previously creatinine noted to be 3.12 back on 03/02/2021.  Patient has been given Lasix 20 mg IV x1 dose in the ED. suspect patient likely progressed to end-stage renal disease -Admit to a progressive bed -Avoid nephrotoxic agents -Strict I&Os and daily weights -Check urine creatinine, sodium, and urea -Lasix 80 mg IV given x1 dose per nephrology -Nephrology consulted,  will follow-up for any further recommendations  Hypertensive emergency Acute.  On admission blood pressures elevated up to 238/131.  Patient reported having headaches and blurry vision. Off of medication for 1 year now due to lack of insurance.  Previous regimen included amlodipine 10 mg daily, clonidine 0.1 mg twice daily and metoprolol 25 mg twice daily.  In the ED patient had been given 30 mg of labetalol IV.  -Restart prior regimen of amlodipine, clonidine, and metoprolol -Hydralazine IV as needed  for elevated blood pressure -Transitions of care consulted  Normocytic anemia Acute.  On admission hemoglobin 7.9 with RDW elevated at 16.2.  MCV and MCH at the lower limits of normal.  Patient denies any reports of bleeding.  Question if secondary to patient's end-stage renal disease versus GI bleeding. -Type and screen for possible need of blood -Check anemia panel and recheck H&H -Check stool guaiac  Elevated troponin chest pains Patient had reported intermittent chest pains.  High-sensitivity troponin 47->46.  EKG without significant ischemic changes. -Follow-up echocardiogram -Formally consult cardiology if heart function noted to be decreased.  Lower extremity swelling Patient reports having lower extremity swelling for the last month.  Denies any increase in weight.  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 -Check D-dimer and Doppler ultrasound of the bilateral lower extremities  Metabolic acidosis Patient noted to have CO2 18 without elevated anion gap at this time.  Suspect secondary to patient's kidney disease. -Likely warrants being started on sodium bicarb.  We will defer to nephrology recommendations  Diabetes mellitus type 2, well controlled Last available hemoglobin A1c from 02/27/2021 was 6.1. -Check hemoglobin A1c -Carb modified and renal diet.  Consider adding on sliding scale insulin if needed  Pulmonary nodule Chest x-ray noted a pulmonary nodule measuring 7 mm in the right lower lung field. -May warrant further work-up management or imaging in the outpatient setting  Tobacco abuse Patient reports at least 30 smoking pack-year history of tobacco. -Counseled on the need for cessation of tobacco -Nicotine patch offered  DVT prophylaxis: SCDs if ultrasound lower extremities negative until able to rule out possibility of bleeding Advance Care Planning:   Code Status: Full Code    Consults: Nephrology  Family Communication: Significant other updated at bedside  Severity of Illness: The appropriate patient status for this patient is OBSERVATION. Observation status is judged to be reasonable and necessary in order to provide the required intensity of service to ensure the patient's safety. The patient's presenting symptoms, physical exam findings, and initial radiographic and laboratory data in the context of their medical condition is felt to place them at decreased risk for further clinical deterioration. Furthermore, it is anticipated that the patient will be medically stable for discharge from the hospital within 2 midnights of admission.   Author: Norval Morton, MD 03/24/2022 8:32 AM  For on call review [email or web address].

## 2022-03-24 NOTE — ED Triage Notes (Addendum)
Patient reports SOB and swelling at bilateral legs for several weeks , fatigue and dry cough . Hypertensive at arrival.

## 2022-03-24 NOTE — Progress Notes (Signed)
  Echocardiogram 2D Echocardiogram has been performed.  Levi Hodges 03/24/2022, 1:53 PM

## 2022-03-24 NOTE — Consult Note (Addendum)
Hospital Consult    Reason for Consult:  [facility] + Permanent dialysis access  Requesting Physician:  Dr. Otelia Santee MRN #:  [id number]  History of Present Illness: This is a 46 y.o. male with uncontrolled DM, uncontrolled HTN and CKD IV who presented with worsening swelling of his legs and shortness of breath. He additionally has been having other uremic symptoms over past months. On presentation his Hgb 7.9, BUN 67, Scr 8.7 (previously 3 in 2022) and blood pressure of 238/133. He is suspected to have progressed to ESRD. He denies any previous central access such as PICC line, Central lines or IV's. He has no upper extremity limitations or prior surgeries. He is right hand dominant. Vascular surgery has been consulted for temporary access as well as permanent access placement.   Past Medical History:  Diagnosis Date   Diabetes mellitus without complication ([place])    Hypertension    Renal disorder     History reviewed. No pertinent surgical history.  No Known Allergies  Prior to Admission medications   Medication Sig Start Date End Date Taking? Authorizing Provider  acetaminophen (TYLENOL) 325 MG tablet Take 650 mg by mouth every 6 (six) hours as needed for headache. Patient not taking: Reported on 03/24/2022    [provider]  amLODipine (NORVASC) 10 MG tablet TAKE 1 TABLET(10 MG) BY MOUTH DAILY Patient not taking: Reported on 03/24/2022 03/02/21   Cristal Ford, DO  Blood Glucose Monitoring Suppl ([place]) w/Device KIT 1 Device by Does not apply route as directed. 03/21/19   [name], Melanie Crazier, MD  cloNIDine (CATAPRES) 0.1 MG tablet Take 1 tablet (0.1 mg total) by mouth 2 (two) times daily. Patient not taking: Reported on 03/24/2022 03/02/21 05/31/21  Cristal Ford, DO  glimepiride (AMARYL) 1 MG tablet Take 1 tablet (1 mg total) by mouth daily with breakfast. Patient not taking: Reported on 03/24/2022 03/02/21   Cristal Ford, DO  glucose blood  (ONETOUCH VERIO) test strip 4x daily 03/21/19   [name], Melanie Crazier, MD  Insulin Pen Needle (PEN NEEDLES) 31G X 5 MM MISC 1 Package by Does not apply route daily. 04/15/19   [name], Melanie Crazier, MD  metoprolol tartrate (LOPRESSOR) 25 MG tablet Take 1 tablet (25 mg total) by mouth 2 (two) times daily. Patient not taking: Reported on 03/24/2022 03/02/21 05/31/21  Cristal Ford, DO  simvastatin (ZOCOR) 20 MG tablet TAKE 1 TABLET(20 MG) BY MOUTH AT BEDTIME Patient not taking: Reported on 03/24/2022 03/02/21   Cristal Ford, DO    Social History   Socioeconomic History   Marital status: Married    Spouse name: Not on file   Number of children: Not on file   Years of education: Not on file   Highest education level: Not on file  Occupational History   Not on file  Tobacco Use   Smoking status: Every Day    Packs/day: 1.00    Types: Cigarettes   Smokeless tobacco: Never  Vaping Use   Vaping Use: Never used  Substance and Sexual Activity   Alcohol use: No   Drug use: No   Sexual activity: Yes  Other Topics Concern   Not on file  Social History Narrative   Not on file   Social Determinants of Health   Financial Resource Strain: Not on file  Food Insecurity: Not on file  Transportation Needs: Not on file  Physical Activity: Not on file  Stress: Not on file  Social Connections: Not on file  Intimate Partner Violence: Not on file     Family History  Problem Relation Age of Onset   Diabetes Maternal Grandfather     ROS: Otherwise negative unless mentioned in HPI  Physical Examination  Vitals:   03/24/22 1215 03/24/22 1230  BP: (!) 187/111 (!) 193/114  Pulse:  74  Resp: (!) 22 19  Temp:    SpO2:  98%   Body mass index is 27.35 kg/m.  General:  WDWN in NAD Gait: Normal HENT: WNL, normocephalic Pulmonary: normal non-labored breathing, without wheezing Cardiac: regular rate and rhythm Abdomen:  soft, NT/ND Vascular Exam/Pulses: 2+ radial pulses  bilaterally, 5/5 grip strength Extremities: without ischemic changes, without Gangrene , without cellulitis; without open wounds;  Musculoskeletal: no muscle wasting or atrophy  Neurologic: A&O X 3;  No focal weakness or paresthesias are detected; speech is fluent/normal Psychiatric:  The pt has Normal affect. Lymph:  Unremarkable  CBC    Component Value Date/Time   WBC 7.0 03/24/2022 0231   RBC 2.63 (L) 03/24/2022 0921   RBC 2.96 (L) 03/24/2022 0231   HGB 7.9 (L) 03/24/2022 0231   HCT 25.8 (L) 03/24/2022 0231   PLT 382 03/24/2022 0231   MCV 87.2 03/24/2022 0231   MCH 26.7 03/24/2022 0231   MCHC 30.6 03/24/2022 0231   RDW 16.2 (H) 03/24/2022 0231   LYMPHSABS 1.3 03/24/2022 0231   MONOABS 0.8 03/24/2022 0231   EOSABS 0.6 (H) 03/24/2022 0231   BASOSABS 0.1 03/24/2022 0231    BMET    Component Value Date/Time   NA 141 03/24/2022 0231   K 4.4 03/24/2022 0231   CL 114 (H) 03/24/2022 0231   CO2 18 (L) 03/24/2022 0231   GLUCOSE 104 (H) 03/24/2022 0231   BUN 67 (H) 03/24/2022 0231   CREATININE 8.77 (H) 03/24/2022 0231   CALCIUM 7.7 (L) 03/24/2022 0231   GFRNONAA 7 (L) 03/24/2022 0231    COAGS: Lab Results  Component Value Date   INR 1.1 03/24/2022   INR 1.1 05/24/2019     Non-Invasive Vascular Imaging:   Bilateral upper extremity vein map pending  Statin:  Yes.   Beta Blocker:  Yes.   Aspirin:  No. ACEI:  No. ARB:  No. CCB use:  Yes Other antiplatelets/anticoagulants:  No.    ASSESSMENT/PLAN: This is a 46 y.o. male with CKD IV now felt to be progressed to ESRD in need of permanent access as well as a Temporary Dialysis catheter so that he can initiate HD during this admission. He is right handed.  Bilateral upper extremity vein map is pending. Plan will be for LUE Arteriovenous fistula vs graft. - On call vascular surgeon, Dr. Carlis Abbott will see patient later this afternoon to provide further surgical plans   Karoline Caldwell PA-C [facility] [phone] 03/24/2022  1:58 PM  I have seen and evaluated the patient. I agree with the PA note as documented above.  46 year old male with acute on chronic kidney disease stage IV with hypertensive emergency that vascular surgery has been consulted for permanent hemodialysis access.  Patient states he is right-handed.  No access in the past.  Palpable radial and brachial pulses bilateral upper extremities.  Seen by Dr. Augustin Coupe with nephrology today who anticipates initiating dialysis this hospitalization unless his renal function improves.  I have discussed plans for tunneled dialysis catheter placement and left arm AV fistula.  He has vein mapping ordered.  I will tentatively put him on the schedule for Wednesday with Dr.  Hawken.  Risks and benefits discussed.  Marty Heck, MD [facility] [place] Office: [phone]

## 2022-03-24 NOTE — Plan of Care (Signed)

## 2022-03-25 ENCOUNTER — Inpatient Hospital Stay (HOSPITAL_COMMUNITY): Payer: Medicaid Other

## 2022-03-25 DIAGNOSIS — E8779 Other fluid overload: Secondary | ICD-10-CM

## 2022-03-25 DIAGNOSIS — N186 End stage renal disease: Secondary | ICD-10-CM

## 2022-03-25 DIAGNOSIS — E872 Acidosis, unspecified: Secondary | ICD-10-CM

## 2022-03-25 DIAGNOSIS — M7989 Other specified soft tissue disorders: Secondary | ICD-10-CM

## 2022-03-25 DIAGNOSIS — Z720 Tobacco use: Secondary | ICD-10-CM

## 2022-03-25 DIAGNOSIS — R911 Solitary pulmonary nodule: Secondary | ICD-10-CM

## 2022-03-25 LAB — RENAL FUNCTION PANEL
Albumin: 2.9 g/dL — ABNORMAL LOW (ref 3.5–5.0)
Anion gap: 9 (ref 5–15)
BUN: 68 mg/dL — ABNORMAL HIGH (ref 6–20)
CO2: 19 mmol/L — ABNORMAL LOW (ref 22–32)
Calcium: 7.5 mg/dL — ABNORMAL LOW (ref 8.9–10.3)
Chloride: 111 mmol/L (ref 98–111)
Creatinine, Ser: 8.67 mg/dL — ABNORMAL HIGH (ref 0.61–1.24)
GFR, Estimated: 7 mL/min — ABNORMAL LOW (ref >60)
Glucose, Bld: 167 mg/dL — ABNORMAL HIGH (ref 70–99)
Phosphorus: 5.6 mg/dL — ABNORMAL HIGH (ref 2.5–4.6)
Potassium: 4 mmol/L (ref 3.5–5.1)
Sodium: 139 mmol/L (ref 135–145)

## 2022-03-25 LAB — CBC
HCT: 23.8 % — ABNORMAL LOW (ref 39.0–52.0)
Hemoglobin: 7.4 g/dL — ABNORMAL LOW (ref 13.0–17.0)
MCH: 26.6 pg (ref 26.0–34.0)
MCHC: 31.1 g/dL (ref 30.0–36.0)
MCV: 85.6 fL (ref 80.0–100.0)
Platelets: 345 10*3/uL (ref 150–400)
RBC: 2.78 MIL/uL — ABNORMAL LOW (ref 4.22–5.81)
RDW: 16.2 % — ABNORMAL HIGH (ref 11.5–15.5)
WBC: 5.7 10*3/uL (ref 4.0–10.5)
nRBC: 0 % (ref 0.0–0.2)

## 2022-03-25 LAB — PARATHYROID HORMONE, INTACT (NO CA): PTH: 260 pg/mL — ABNORMAL HIGH (ref 15–65)

## 2022-03-25 LAB — UREA NITROGEN, URINE: Urea Nitrogen, Ur: 201 mg/dL

## 2022-03-25 MED ORDER — HEPARIN SODIUM (PORCINE) 5000 UNIT/ML IJ SOLN
5000.0000 [IU] | Freq: Three times a day (TID) | INTRAMUSCULAR | Status: DC
Start: 2022-03-25 — End: 2022-03-27
  Administered 2022-03-25 – 2022-03-27 (×5): 5000 [IU] via SUBCUTANEOUS
  Filled 2022-03-25 (×6): qty 1

## 2022-03-25 MED ORDER — CLONIDINE HCL 0.1 MG PO TABS
0.1000 mg | ORAL_TABLET | Freq: Three times a day (TID) | ORAL | Status: DC
  Administered 2022-03-25 – 2022-03-27 (×7): 0.1 mg via ORAL
  Filled 2022-03-25 (×8): qty 1

## 2022-03-25 MED ORDER — CLONIDINE HCL 0.2 MG PO TABS: 0.2000 mg | ORAL_TABLET | Freq: Three times a day (TID) | ORAL | Status: DC

## 2022-03-25 MED ORDER — CARVEDILOL 6.25 MG PO TABS
6.2500 mg | ORAL_TABLET | Freq: Two times a day (BID) | ORAL | Status: DC
  Administered 2022-03-25 – 2022-03-27 (×5): 6.25 mg via ORAL
  Filled 2022-03-25 (×5): qty 1

## 2022-03-25 MED ORDER — HYDRALAZINE HCL 20 MG/ML IJ SOLN
10.0000 mg | INTRAMUSCULAR | Status: DC | PRN
  Administered 2022-03-25 – 2022-03-26 (×2): 10 mg via INTRAVENOUS
  Filled 2022-03-25 (×2): qty 1

## 2022-03-25 MED ORDER — SODIUM CHLORIDE 0.9 % IV SOLN
250.0000 mg | Freq: Once | INTRAVENOUS | Status: DC
  Administered 2022-03-26: 250 mg via INTRAVENOUS
  Filled 2022-03-25 (×2): qty 20

## 2022-03-25 MED ORDER — SEVELAMER CARBONATE 800 MG PO TABS
800.0000 mg | ORAL_TABLET | Freq: Three times a day (TID) | ORAL | Status: DC
  Administered 2022-03-25 – 2022-03-26 (×3): 800 mg via ORAL
  Filled 2022-03-25 (×4): qty 1

## 2022-03-25 NOTE — Progress Notes (Signed)
Requested to see pt for out-pt HD arrangements. Met with pt and pt's wife at bedside. Introduced self and explained role. Pt works full-time and plans to continue to work at d/c. Explained to pt he will need to speak to MD regarding how soon pt can return to work post d/c. Pt works 3rd shift and is requesting a clinic close to home that has a 2nd shift appt available (pt prefers MWF but agreeable to TTS as long as appt is 2nd shift). Discussed clinic options and pt prefers [facility]. Referral made to [facility] admissions today. Pt is uninsured and will require financial clearance for out-pt HD. Pt aware that financial staff from [facility] will be contacting him in order for pt to be cleared financially. Pt drives and plans to drive himself to out-pt HD appts. Will assist as needed.   Melven Sartorius Renal Navigator [phone]

## 2022-03-25 NOTE — Progress Notes (Signed)
Lower extremity venous bilateral and upper extremity vein mapping study completed.   Please see CV Proc for preliminary results.   Darlin Coco, RDMS, RVT

## 2022-03-25 NOTE — Progress Notes (Signed)
Pt's wife in room with pt. Wife is noticeably upset about the information her husband relied to her regarding the conversation with the nephrologist this am and asks to speak with pt's "kidney doctor." Nephrologist paged per pt and pt's wife request.

## 2022-03-25 NOTE — Progress Notes (Signed)
Pt's wife comes to the nurse's station talking loudly and waving her arms and hands in the air stating that no one has given her husband blood pressure medication all day and that the nurse (this nurse) has only been in pt's room 2 times today. Nurse informs pt's wife that pt has received his blood pressure medications this am and that he has prn blood pressure medications but he has not met the parameters today until his last BP taken around 1630; and that nurse was getting ready to bring pt his evening BP medications along with his prn BP medication because he has now met the parameters to give it. Nurse also informed pt's wife that this nurse has been in pt's room multiple times today. Nurse made latest round around 1530 and asked pt and pt's wife if they needed anything. Pt stated "no thank, I'm good." Pt's wife rolled her eyes and stated "no." Charge nurse informed.

## 2022-03-25 NOTE — TOC Initial Note (Signed)
Transition of Care [facility]) - Initial/Assessment Note    Patient Details  Name: Levi Hodges MRN: [id number] Date of Birth: 1976/09/05  [facility] CM/SW Contact:    Carles Collet, RN Phone Number: 03/25/2022, 11:15 AM  Clinical Narrative:                Spoke to patient and wife at bedside. They expressed concern that wife is on disability and that he may lose his job if he is unable to work. Discussed with them that I will make reference to first source for medicaid as his requirement for HD is a new qualifier. Also discussed that he should speak with his job about going back on restricted duty that would not involve lifting.  Patient states that his insurance is to start on 04/03/22. We discussed that he can assistance through [facility] for initial medication fill at DC. He is already set up to go back to his PCP on 6/14 [facility] will continue to follow.    Expected Discharge Plan: Home/Self Care Barriers to Discharge: Continued Medical Work up, Inadequate or no insurance, Waiting for outpatient dialysis   Patient Goals and CMS Choice Patient states their goals for this hospitalization and ongoing recovery are:: to go home      Expected Discharge Plan and Services Expected Discharge Plan: Home/Self Care In-house Referral: Clinical Social Work Discharge Planning Services: CM Consult   Living arrangements for the past 2 months: [place]                                      Prior Living Arrangements/Services Living arrangements for the past 2 months: Single Family Home Lives with:: Spouse                   Activities of Daily Living Home Assistive Devices/Equipment: None ADL Screening (condition at time of admission) Patient's cognitive ability adequate to safely complete daily activities?: Yes Is the patient deaf or have difficulty hearing?: No Does the patient have difficulty seeing, even when  wearing glasses/contacts?: Yes Does the patient have difficulty concentrating, remembering, or making decisions?: No Patient able to express need for assistance with ADLs?: Yes Does the patient have difficulty dressing or bathing?: No Independently performs ADLs?: Yes (appropriate for developmental age) Does the patient have difficulty walking or climbing stairs?: No Weakness of Legs: None Weakness of Arms/Hands: None  Permission Sought/Granted                  Emotional Assessment              Admission diagnosis:  ARF (acute renal failure) ([place]) [N17.9] Hypertensive emergency [I16.1] Patient Active Problem List   Diagnosis Date Noted   ARF (acute renal failure) ([place]) 03/24/2022   Fluid overload 03/24/2022   Hypertensive emergency 03/24/2022   Normocytic anemia 03/24/2022   Elevated troponin 03/24/2022   Swelling of lower extremity 03/24/2022   Pulmonary nodule 53/61/4431   Metabolic acidosis 54/00/8676   Hypertensive urgency 03/01/2021   Well controlled type 2 diabetes mellitus ([place]) 03/21/2019   Low back pain 03/11/2019   CKD stage 3 due to type 2 diabetes mellitus ([place]) 03/11/2019   Patient's intentional underdosing of medication regimen due to financial hardship 08/27/2018   Hyperlipidemia associated with type 2 diabetes mellitus ([place]) 01/05/2017   Essential hypertension 01/05/2017   Tobacco  abuse 01/05/2017   ED (erectile dysfunction) 01/05/2017   PCP:  Hoyt Koch, MD Pharmacy:   [facility] [place], [place] [address] Phone: [phone] Fax: [phone]     Social Determinants of Health (SDOH) Interventions    Readmission Risk Interventions     View : No data to display.

## 2022-03-25 NOTE — Progress Notes (Addendum)
Charge nurse John spoke with pt's wife to diffuse the situation. Pt's wife stated that she did not want this nurse to provide care for her husband anymore stating, "I don't like those black, young, ghetto nurses." Elwyn Lade will take over pt's care for remainder of this shift.

## 2022-03-25 NOTE — Progress Notes (Signed)
New Dialysis Start   Patient identified as new dialysis start. Kidney Education packet assembled and given. Discussed the following items with patient:    Current medications and possible changes once started:  Discussed that patient's medications may change over time.  Ex; hypertension medications and diabetes medication.  Educated on medications that would be given at dialysis. Nephrologists will adjust as needed.  Fluid restrictions reviewed:  32 oz daily goal:  All liquids count; soups, ice, jello   Phosphorus and potassium: Handout given showing high potassium and phosphorus foods.  Educated about the possibility of phosphorus binders being prescribed and how to take binders if and when prescribed.  Alternative food and drink options given.  Family support:  Wife present at bedside.  Wife expressed that she would like to be present at patient's outpatient dialysis treatments.  Informed patient's  wife that she could ask the center if she cold stay with him when they went to sign new patient paperwork at the outpatient center.  Outpatient Clinic Resources:  Discussed roles of Outpatient clinic  staff and advised to make a list of needs, if any, to talk with outpatient staff if needed  Care plan schedule: Informed patient and family member of Care Plans in outpatient setting and to participate in the care plan.  An invitation would be given from outpatient clinic.   Dialysis Access Options:  Reviewed access options with patients. Discussed in detail about care at home with new AVG & AVF. Reviewed checking bruit and thrill. If dialysis catheter present, educated that patient could not take showers.  Catheter dressing changes were to be done by outpatient clinic staff only  Home therapy options:  Educated patient about home therapy options:  PD vs home hemo.  Patient stated he  is not interested at this time in home therapies.   Patient verbalized understanding. Will continue to round on patient  during admission.    Lilia Argue, RN

## 2022-03-25 NOTE — Progress Notes (Signed)
Pt back from CT and [place]. Wife asks about speaking with nephrologist again. Nurse informs her that nephrologist has been paged. Pt's wife also asks for social worker to be paged because she has a few questions. Social worker has been notified per wife's request.

## 2022-03-25 NOTE — Progress Notes (Addendum)
[facility] Progress Note   46 y.o. male with a history  of uncontrolled DM and HTN who presents for evaluation of CKD.  He also has a history of not taking his medications regularly due to cost and has been out of medications for well over a year.  He has had DM for about 13 years.  He has been on insulin around 8 years and also has h/o not taking insulin regularly due to cost.  He is presenting with hypertensive crisis and acute renal failure with a similar hospitalization in 02/2021. He is uninsured after losing his job and unable to afford any of his antihypertensive medications over the past 13 mths. Creatinine noted to be 8.77 up from 3.1-3.6 April of 2022. Appears to have an episode of acute on chronic kidney disease in April 2022 with improvement from 3.64 -> 3.12 over a course of days when he presented with hypertensive crisis.   In the ED CXR shows vascular congestion and he was given Lasix '20mg'$   in the ED. Systolic BP noted to be in the 230's treated with IV Labetalol pushes and IV diuresis.    Assessment/ Plan:   Acute on CKD IV - Kidney biopsy in 05/2019 c/w diabetic nephropathy with nodular glomerulosclerosis with moderate interstitial fibrosis and  tubular atrophy with no e/o immune complex disease. He almost certainly has had progression of kidney disease and question now is how advanced is the chronic component now.  - Will try to get his bp under better control as well as restart on anti-hypertensive regimen and see where his creatinine settles in at. But with the the h/o  already moderate interstitial fibrosis + tubular atrophy in 2020 + no medical treatment for over a year + anemia + anorexia for months and now nausea over past week all point to his renal disease progressing to ESRD. Cr unchanged this AM c/w advancing renal disease.   - Appreciate Dr. Carlis Abbott [facility] seeing the patient; he will get a TC + AV access for dialysis Wednesday -> will initiate dialysis once TC is in  place.   -Monitor Daily I/Os, Daily weight  -Avoid nephrotoxic medications including NSAIDs, contrast if at all possible. -PVR to ensure no obstructive uropathy; no need for renal ultrasound unless PVR is significant as he already has known advanced kidney disease.   Hypertensive emergency  - On Amlodipine '10mg'$  daily, Metoprolol + diuretics; can start ACEI if needed once he's once dialysis. - Goal BP 30% decrease in 1st 24hrs to maintain adequate vital organ perfusion; oral medications are working and no need for IV gtt. Renal osteodystrophy - phos 5.6 -> start Renvela 1 TIDM Anemia - Will dose IV iron tomorrow and afterwards ESA  DM - A1c previously was 13 in 2020. Per primary. HLD  Subjective:   Denies fever/ chills/ nausea/ myalgias/ dysuria.   Objective:   BP (!) 153/91 (BP Location: Left Arm)   Pulse 75   Temp 98 F (36.7 C) (Oral)   Resp 18   Ht '5\' 9"'$  (1.753 m)   Wt 84 kg   SpO2 96%   BMI 27.35 kg/m   Intake/Output Summary (Last 24 hours) at 03/25/2022 0727 Last data filed at 03/25/2022 0130 Gross per 24 hour  Intake 420 ml  Output 700 ml  Net -280 ml   Weight change:   Physical Exam: GEN: NAD, A&Ox3, NCAT HEENT: No conjunctival pallor, EOMI NECK: Supple, no thyromegaly LUNGS: CTA B/L no rales, rhonchi or wheezing CV:  RRR, No M/R/G ABD: SNDNT +BS  EXT: 1+ lower extremity edema   Imaging: DG Chest 2 View  Result Date: 03/24/2022 CLINICAL DATA:  Shortness of breath. EXAM: CHEST - 2 VIEW COMPARISON:  Chest radiograph dated 03/01/2021. FINDINGS: Mild diffuse chronic interstitial coarsening. No focal consolidation, pleural effusion, pneumothorax. Faint 7 mm nodular density is in the right lower lung field may be artifactual or related to nipple shadow. A pulmonary nodule is not excluded. Attention on follow-up imaging recommended. Top-normal cardiac silhouette. No acute osseous pathology. IMPRESSION: 1. No acute cardiopulmonary process. 2. Faint 7 mm nodular density  in the right lower lung field. Attention on follow-up imaging recommended. Electronically Signed   By: Anner Crete M.D.   On: 03/24/2022 03:06   ECHOCARDIOGRAM COMPLETE  Result Date: 03/24/2022    ECHOCARDIOGRAM REPORT   Patient Name:   Levi Hodges Date of Exam: 03/24/2022 Medical Rec #:  [id number]       Height:       69.0 in Accession #:    [id number]      Weight:       185.2 lb Date of Birth:  01/12/1976        BSA:          2.000 m Patient Age:    42 years        BP:           193/114 mmHg Patient Gender: M               HR:           91 bpm. Exam Location:  Inpatient Procedure: 2D Echo, 3D Echo, Cardiac Doppler, Color Doppler and Strain Analysis Indications:    Elevated troponin. ; R60.0 Lower extremity edema  History:        Patient has no prior history of Echocardiogram examinations.                 Risk Factors:Current Smoker, Diabetes, Hypertension and                 Dyslipidemia. CKD.  Sonographer:    Roseanna Rainbow RDCS Referring Phys: [id number] RONDELL A SMITH  Sonographer Comments: Global longitudinal strain was attempted. Patient in fowler's position. Consult with nephrologist during echo. IMPRESSIONS  1. Left ventricular ejection fraction, by estimation, is 55 to 60%. The left ventricle has normal function. The left ventricle has no regional wall motion abnormalities. There is severe left ventricular hypertrophy. Left ventricular diastolic parameters  are consistent with Grade II diastolic dysfunction (pseudonormalization). Elevated left atrial pressure. The average left ventricular global longitudinal strain is -18.0 % which is low normal. Pattern does not appear to be consistent with amyloid, however, could consider CMR in the future given severe LVH and speckled pattern on echo.  2. Right ventricular systolic function is mildly reduced. The right ventricular size is normal. There is severely elevated pulmonary artery systolic pressure. The estimated right ventricular systolic pressure is 61.6  mmHg.  3. A small pericardial effusion is present. The pericardial effusion is posterior to the left ventricle.  4. The mitral valve is normal in structure. Trivial mitral valve regurgitation.  5. Tricuspid valve regurgitation is mild to moderate.  6. The aortic valve is tricuspid. There is mild thickening of the aortic valve. Aortic valve regurgitation is not visualized. Aortic valve sclerosis is present, with no evidence of aortic valve stenosis.  7. The inferior vena cava is dilated in size with <50% respiratory variability, suggesting right atrial  pressure of 15 mmHg. Comparison(s): No prior Echocardiogram. FINDINGS  Left Ventricle: Left ventricular ejection fraction, by estimation, is 55 to 60%. The left ventricle has normal function. The left ventricle has no regional wall motion abnormalities. The average left ventricular global longitudinal strain is -18.0 %. The left ventricular internal cavity size was normal in size. There is severe left ventricular hypertrophy. Left ventricular diastolic parameters are consistent with Grade II diastolic dysfunction (pseudonormalization). Elevated left atrial pressure. Right Ventricle: The right ventricular size is normal. No increase in right ventricular wall thickness. Right ventricular systolic function is mildly reduced. There is severely elevated pulmonary artery systolic pressure. The tricuspid regurgitant velocity is 3.70 m/s, and with an assumed right atrial pressure of 15 mmHg, the estimated right ventricular systolic pressure is 88.2 mmHg. Left Atrium: Left atrial size was normal in size. Right Atrium: Right atrial size was normal in size. Pericardium: A small pericardial effusion is present. The pericardial effusion is posterior to the left ventricle. Mitral Valve: The mitral valve is normal in structure. Trivial mitral valve regurgitation. Tricuspid Valve: The tricuspid valve is normal in structure. Tricuspid valve regurgitation is mild to moderate. Aortic  Valve: The aortic valve is tricuspid. There is mild thickening of the aortic valve. Aortic valve regurgitation is not visualized. Aortic valve sclerosis is present, with no evidence of aortic valve stenosis. Pulmonic Valve: The pulmonic valve was normal in structure. Pulmonic valve regurgitation is trivial. Aorta: The aortic root and ascending aorta are structurally normal, with no evidence of dilitation. Venous: The inferior vena cava is dilated in size with less than 50% respiratory variability, suggesting right atrial pressure of 15 mmHg. IAS/Shunts: The atrial septum is grossly normal.  LEFT VENTRICLE PLAX 2D LVIDd:         4.40 cm      Diastology LVIDs:         3.10 cm      LV e' medial:    4.90 cm/s LV PW:         1.80 cm      LV E/e' medial:  20.0 LV IVS:        1.40 cm      LV e' lateral:   7.30 cm/s LVOT diam:     2.20 cm      LV E/e' lateral: 13.4 LV SV:         77 LV SV Index:   38           2D Longitudinal Strain LVOT Area:     3.80 cm     2D Strain GLS Avg:     -18.0 %  LV Volumes (MOD) LV vol d, MOD A2C: 113.0 ml LV vol d, MOD A4C: 79.3 ml LV vol s, MOD A2C: 41.1 ml LV vol s, MOD A4C: 34.7 ml LV SV MOD A2C:     71.9 ml LV SV MOD A4C:     79.3 ml LV SV MOD BP:      57.1 ml RIGHT VENTRICLE            IVC RV S prime:     9.41 cm/s  IVC diam: 1.90 cm TAPSE (M-mode): 1.6 cm LEFT ATRIUM             Index        RIGHT ATRIUM           Index LA diam:        4.90 cm 2.45 cm/m   RA Area:  16.80 cm LA Vol (A2C):   44.2 ml 22.11 ml/m  RA Volume:   44.30 ml  22.16 ml/m LA Vol (A4C):   55.8 ml 27.91 ml/m LA Biplane Vol: 54.3 ml 27.16 ml/m  AORTIC VALVE LVOT Vmax:   118.00 cm/s LVOT Vmean:  71.300 cm/s LVOT VTI:    0.202 m  AORTA Ao Root diam: 2.90 cm Ao Asc diam:  3.00 cm MITRAL VALVE               TRICUSPID VALVE MV Area (PHT): 4.21 cm    TR Peak grad:   54.8 mmHg MV Decel Time: 180 msec    TR Vmax:        370.00 cm/s MV E velocity: 98.00 cm/s MV A velocity: 44.50 cm/s  SHUNTS MV E/A ratio:  2.20         Systemic VTI:  0.20 m                            Systemic Diam: 2.20 cm Gwyndolyn Kaufman MD Electronically signed by Gwyndolyn Kaufman MD Signature Date/Time: 03/24/2022/3:22:14 PM    Final     Labs: BMET Recent Labs  Lab 03/24/22 0231 03/24/22 0610 03/25/22 0120  NA 141  --  139  K 4.4  --  4.0  CL 114*  --  111  CO2 18*  --  19*  GLUCOSE 104*  --  167*  BUN 67*  --  68*  CREATININE 8.77*  --  8.67*  CALCIUM 7.7*  --  7.5*  PHOS  --  5.7* 5.6*   CBC Recent Labs  Lab 03/24/22 0231 03/24/22 2019 03/25/22 0120  WBC 7.0  --  5.7  NEUTROABS 4.3  --   --   HGB 7.9* 8.0* 7.4*  HCT 25.8* 24.4* 23.8*  MCV 87.2  --  85.6  PLT 382  --  345    Medications:     amLODipine  10 mg Oral Daily   Chlorhexidine Gluconate Cloth  6 each Topical Q0600   cloNIDine  0.1 mg Oral BID   metoprolol tartrate  25 mg Oral BID   nicotine  21 mg Transdermal Daily   sodium chloride flush  3 mL Intravenous Q12H      Otelia Santee, MD 03/25/2022, 7:27 AM

## 2022-03-25 NOTE — Progress Notes (Signed)
[facility]  Subjective  -no complaints   Objective (!) 153/91 75 98 F (36.7 C) (Oral) 18 96%  Intake/Output Summary (Last 24 hours) at 03/25/2022 0657 Last data filed at 03/25/2022 0130 Gross per 24 hour  Intake 420 ml  Output 700 ml  Net -280 ml    Bilateral radial brachial pulses palpable  Laboratory Lab Results: Recent Labs    03/24/22 0231 03/24/22 2019 03/25/22 0120  WBC 7.0  --  5.7  HGB 7.9* 8.0* 7.4*  HCT 25.8* 24.4* 23.8*  PLT 382  --  345   BMET Recent Labs    03/24/22 0231 03/25/22 0120  NA 141 139  K 4.4 4.0  CL 114* 111  CO2 18* 19*  GLUCOSE 104* 167*  BUN 67* 68*  CREATININE 8.77* 8.67*  CALCIUM 7.7* 7.5*    COAG Lab Results  Component Value Date   INR 1.1 03/24/2022   INR 1.1 05/24/2019   No results found for: PTT  Assessment/Planning:  46 year old male with AKI on stage IV CKD likely converting to ESRD that vascular surgery was consulted for permanent access and TDC.  Vein mapping ordered today.  Plan left arm AV fistula and TDC tomorrow with Dr. Stanford Breed.  Please keep n.p.o. after midnight.  I updated the patient again this morning and answered all his questions.  Marty Heck 03/25/2022 6:57 AM --

## 2022-03-25 NOTE — Progress Notes (Signed)
PT Cancellation Note  Patient Details Name: Levi Hodges MRN: [id number] DOB: [birth year]   Cancelled Treatment:    Reason Eval/Treat Not Completed: Patient at procedure or test/unavailable  Patient off the unit.    Arby Barrette, PT Acute Rehabilitation Services  Pager [phone] Office [phone]   Rexanne Mano 03/25/2022, 9:20 AM

## 2022-03-25 NOTE — Progress Notes (Addendum)
PROGRESS NOTE                                                                                                                                                                                                             Patient Demographics:    Levi Hodges, is a 46 y.o. male, DOB - 06/01/1976, EHO:[id number]  Outpatient Primary MD for the patient is Hoyt Koch, MD    LOS - 1  Admit date - 03/24/2022    Chief Complaint  Patient presents with   SOB / Legs Swelling    Hypertensive       Brief Narrative (HPI from H&P)   a 46 y.o. male with medical history significant of hypertension, diabetes mellitus type 2,  chronic kidney disease stage IV, and tobacco abuse who presents with complaints of progressively worsening swelling of his legs and shortness of breath over the last month, he was diagnosed with CKD 5 now progressing to ESRD along with fluid overload, hypertensive urgency and admitted to the hospital   Subjective:    Levi Hodges today has, No headache, No chest pain, No abdominal pain - No Nausea, No new weakness tingling or numbness, no SOB.   Assessment  & Plan :    CKD 5 now progressing to ESRD.  Due to medication noncompliance caused by lack of insurance, nephrology on board he is going to get temporary dialysis catheter placed by vascular surgery and dialysis to commence on 03/26/2022, further management per nephrology.  2.  Hypertensive emergency.  Due to lack of medications caused by lack of insurance, placed on appropriate medications and doses adjusted further on 03/25/2022.  Continue to monitor and adjust gradually.  3.  Anemia of chronic disease.  Monitor no signs of ongoing acute blood loss.  4.  Acute on chronic diastolic CHF due to fluid overload built by renal dysfunction.  Fluid removal through HD now, received IV Lasix on 03/24/2022.  Currently much improved clinically.  5.  Hypertensive  emergency causing mild troponin rise, troponin rise in non-ACS pattern and trend is flat, EKG nonacute, with controlling of blood pressure chest pain resolved.  6.  Metabolic acidosis.  Kindly see #1 above.  7.  Smoking.  Counseled to quit.  8.  7 mm right lower lobe lung nodule.  Outpatient follow-up with pulmonary within 1 to  2 weeks of discharge.  Since is a smoker we will go ahead and get a CT chest.  9.  DM type II.  Diet controlled, monitor.  Lab Results  Component Value Date   HGBA1C 5.4 03/24/2022        Condition -   Guarded  Family Communication  :  none present  Code Status :  Full  Consults  :  VVS, Renal  PUD Prophylaxis :    Procedures  :     TTE - 1. Left ventricular ejection fraction, by estimation, is 55 to 60%. The left ventricle has normal function. The left ventricle has no regional wall motion abnormalities. There is severe left ventricular hypertrophy. Left ventricular diastolic parameters  are consistent with Grade II diastolic dysfunction (pseudonormalization). Elevated left atrial pressure. The average left ventricular global longitudinal strain is -18.0 % which is low normal. Pattern does not appear to be consistent with amyloid, however, could consider CMR in the future given severe LVH and speckled pattern on echo.  2. Right ventricular systolic function is mildly reduced. The right ventricular size is normal. There is severely elevated pulmonary artery systolic pressure. The estimated right ventricular systolic pressure is 30.8 mmHg.  3. A small pericardial effusion is present. The pericardial effusion is posterior to the left ventricle.  4. The mitral valve is normal in structure. Trivial mitral valve regurgitation.  5. Tricuspid valve regurgitation is mild to moderate.  6. The aortic valve is tricuspid. There is mild thickening of the aortic valve. Aortic valve regurgitation is not visualized. Aortic valve sclerosis is present, with no evidence of aortic  valve stenosis.  7. The inferior vena cava is dilated in size with <50% respiratory variability, suggesting right atrial pressure of 15 mmHg.  CT chest  Temporary dialysis catheter placement on 03/25/2022 due.      Disposition Plan  :    Status is: Inpatient  DVT Prophylaxis  :  Heparin  SCDs Start: 03/24/22 0852 SCDs Start: 03/24/22 0630    Lab Results  Component Value Date   PLT 345 03/25/2022    Diet :  Diet Order             Diet NPO time specified Except for: Sips with Meds  Diet effective midnight           Diet renal/carb modified with fluid restriction Diet-HS Snack? Nothing; Fluid restriction: 1200 mL Fluid; Room service appropriate? Yes; Fluid consistency: Thin  Diet effective now                    Inpatient Medications  Scheduled Meds:  amLODipine  10 mg Oral Daily   carvedilol  6.25 mg Oral BID WC   Chlorhexidine Gluconate Cloth  6 each Topical Q0600   cloNIDine  0.1 mg Oral TID   heparin injection (subcutaneous)  5,000 Units Subcutaneous Q8H   nicotine  21 mg Transdermal Daily   sevelamer carbonate  800 mg Oral TID WC   sodium chloride flush  3 mL Intravenous Q12H   Continuous Infusions:  sodium chloride     sodium chloride     [START ON 03/26/2022] ferric gluconate (FERRLECIT) IVPB     PRN Meds:.sodium chloride, sodium chloride, acetaminophen **OR** acetaminophen, alteplase, heparin, hydrALAZINE, labetalol, lidocaine (PF), lidocaine-prilocaine, ondansetron **OR** ondansetron (ZOFRAN) IV, pentafluoroprop-tetrafluoroeth  Antibiotics  :    Anti-infectives (From admission, onward)    None        Time Spent in minutes  30  Lala Lund M.D on 03/25/2022 at 8:40 AM  To page go to [email or web address]   [facility] -  Office  [phone]  See all Orders from today for further details    Objective:   Vitals:   03/24/22 2310 03/25/22 0200 03/25/22 0344 03/25/22 0751  BP: (!) 145/81 (!) 158/94 (!) 153/91 (!) 184/98  Pulse: 72 72  75 80  Resp: '17 19 18 '$ (!) 24  Temp: 97.9 F (36.6 C)  98 F (36.7 C) 98.2 F (36.8 C)  TempSrc: Oral  Oral Oral  SpO2: 96% 95% 96% 95%  Weight:      Height:        Wt Readings from Last 3 Encounters:  03/24/22 84 kg  03/02/21 77.2 kg  09/21/19 85.3 kg     Intake/Output Summary (Last 24 hours) at 03/25/2022 0840 Last data filed at 03/25/2022 0130 Gross per 24 hour  Intake 420 ml  Output 700 ml  Net -280 ml     Physical Exam  Awake Alert, No new F.N deficits, Normal affect Coats.AT,PERRAL Supple Neck, No JVD,   Symmetrical Chest wall movement, Good air movement bilaterally, CTAB RRR,No Gallops,Rubs or new Murmurs,  +ve B.Sounds, Abd Soft, No tenderness,   No Cyanosis, Clubbing or edema       Data Review:    CBC Recent Labs  Lab 03/24/22 0231 03/24/22 2019 03/25/22 0120  WBC 7.0  --  5.7  HGB 7.9* 8.0* 7.4*  HCT 25.8* 24.4* 23.8*  PLT 382  --  345  MCV 87.2  --  85.6  MCH 26.7  --  26.6  MCHC 30.6  --  31.1  RDW 16.2*  --  16.2*  LYMPHSABS 1.3  --   --   MONOABS 0.8  --   --   EOSABS 0.6*  --   --   BASOSABS 0.1  --   --     Electrolytes Recent Labs  Lab 03/24/22 0231 03/24/22 0921 03/24/22 1400 03/24/22 2019 03/25/22 0120  NA 141  --   --   --  139  K 4.4  --   --   --  4.0  CL 114*  --   --   --  111  CO2 18*  --   --   --  19*  GLUCOSE 104*  --   --   --  167*  BUN 67*  --   --   --  68*  CREATININE 8.77*  --   --   --  8.67*  CALCIUM 7.7*  --   --   --  7.5*  AST 50*  --   --   --   --   ALT 47*  --   --   --   --   ALKPHOS 162*  --   --   --   --   BILITOT 0.8  --   --   --   --   ALBUMIN 3.3*  --   --   --  2.9*  DDIMER  --   --   --  1.42*  --   INR  --  1.1  --   --   --   HGBA1C  --   --  5.4  --   --   BNP 1,666.4*  --   --   --   --        Micro Results No results found for this or any previous visit (from  the past 240 hour(s)).  Radiology Reports DG Chest 2 View  Result Date: 03/24/2022 CLINICAL DATA:  Shortness of  breath. EXAM: CHEST - 2 VIEW COMPARISON:  Chest radiograph dated 03/01/2021. FINDINGS: Mild diffuse chronic interstitial coarsening. No focal consolidation, pleural effusion, pneumothorax. Faint 7 mm nodular density is in the right lower lung field may be artifactual or related to nipple shadow. A pulmonary nodule is not excluded. Attention on follow-up imaging recommended. Top-normal cardiac silhouette. No acute osseous pathology. IMPRESSION: 1. No acute cardiopulmonary process. 2. Faint 7 mm nodular density in the right lower lung field. Attention on follow-up imaging recommended. Electronically Signed   By: Anner Crete M.D.   On: 03/24/2022 03:06   ECHOCARDIOGRAM COMPLETE  Result Date: 03/24/2022    ECHOCARDIOGRAM REPORT   Patient Name:   Levi Hodges Date of Exam: 03/24/2022 Medical Rec #:  [id number]       Height:       69.0 in Accession #:    [id number]      Weight:       185.2 lb Date of Birth:  [birth year]        BSA:          2.000 m Patient Age:    29 years        BP:           193/114 mmHg Patient Gender: M               HR:           91 bpm. Exam Location:  Inpatient Procedure: 2D Echo, 3D Echo, Cardiac Doppler, Color Doppler and Strain Analysis Indications:    Elevated troponin. ; R60.0 Lower extremity edema  History:        Patient has no prior history of Echocardiogram examinations.                 Risk Factors:Current Smoker, Diabetes, Hypertension and                 Dyslipidemia. CKD.  Sonographer:    Roseanna Rainbow RDCS Referring Phys: [id number] RONDELL A SMITH  Sonographer Comments: Global longitudinal strain was attempted. Patient in fowler's position. Consult with nephrologist during echo. IMPRESSIONS  1. Left ventricular ejection fraction, by estimation, is 55 to 60%. The left ventricle has normal function. The left ventricle has no regional wall motion abnormalities. There is severe left ventricular hypertrophy. Left ventricular diastolic parameters  are consistent with Grade II diastolic  dysfunction (pseudonormalization). Elevated left atrial pressure. The average left ventricular global longitudinal strain is -18.0 % which is low normal. Pattern does not appear to be consistent with amyloid, however, could consider CMR in the future given severe LVH and speckled pattern on echo.  2. Right ventricular systolic function is mildly reduced. The right ventricular size is normal. There is severely elevated pulmonary artery systolic pressure. The estimated right ventricular systolic pressure is 30.8 mmHg.  3. A small pericardial effusion is present. The pericardial effusion is posterior to the left ventricle.  4. The mitral valve is normal in structure. Trivial mitral valve regurgitation.  5. Tricuspid valve regurgitation is mild to moderate.  6. The aortic valve is tricuspid. There is mild thickening of the aortic valve. Aortic valve regurgitation is not visualized. Aortic valve sclerosis is present, with no evidence of aortic valve stenosis.  7. The inferior vena cava is dilated in size with <50% respiratory variability, suggesting right atrial pressure of 15 mmHg. Comparison(s): No prior Echocardiogram.  FINDINGS  Left Ventricle: Left ventricular ejection fraction, by estimation, is 55 to 60%. The left ventricle has normal function. The left ventricle has no regional wall motion abnormalities. The average left ventricular global longitudinal strain is -18.0 %. The left ventricular internal cavity size was normal in size. There is severe left ventricular hypertrophy. Left ventricular diastolic parameters are consistent with Grade II diastolic dysfunction (pseudonormalization). Elevated left atrial pressure. Right Ventricle: The right ventricular size is normal. No increase in right ventricular wall thickness. Right ventricular systolic function is mildly reduced. There is severely elevated pulmonary artery systolic pressure. The tricuspid regurgitant velocity is 3.70 m/s, and with an assumed right atrial  pressure of 15 mmHg, the estimated right ventricular systolic pressure is 95.6 mmHg. Left Atrium: Left atrial size was normal in size. Right Atrium: Right atrial size was normal in size. Pericardium: A small pericardial effusion is present. The pericardial effusion is posterior to the left ventricle. Mitral Valve: The mitral valve is normal in structure. Trivial mitral valve regurgitation. Tricuspid Valve: The tricuspid valve is normal in structure. Tricuspid valve regurgitation is mild to moderate. Aortic Valve: The aortic valve is tricuspid. There is mild thickening of the aortic valve. Aortic valve regurgitation is not visualized. Aortic valve sclerosis is present, with no evidence of aortic valve stenosis. Pulmonic Valve: The pulmonic valve was normal in structure. Pulmonic valve regurgitation is trivial. Aorta: The aortic root and ascending aorta are structurally normal, with no evidence of dilitation. Venous: The inferior vena cava is dilated in size with less than 50% respiratory variability, suggesting right atrial pressure of 15 mmHg. IAS/Shunts: The atrial septum is grossly normal.  LEFT VENTRICLE PLAX 2D LVIDd:         4.40 cm      Diastology LVIDs:         3.10 cm      LV e' medial:    4.90 cm/s LV PW:         1.80 cm      LV E/e' medial:  20.0 LV IVS:        1.40 cm      LV e' lateral:   7.30 cm/s LVOT diam:     2.20 cm      LV E/e' lateral: 13.4 LV SV:         77 LV SV Index:   38           2D Longitudinal Strain LVOT Area:     3.80 cm     2D Strain GLS Avg:     -18.0 %  LV Volumes (MOD) LV vol d, MOD A2C: 113.0 ml LV vol d, MOD A4C: 79.3 ml LV vol s, MOD A2C: 41.1 ml LV vol s, MOD A4C: 34.7 ml LV SV MOD A2C:     71.9 ml LV SV MOD A4C:     79.3 ml LV SV MOD BP:      57.1 ml RIGHT VENTRICLE            IVC RV S prime:     9.41 cm/s  IVC diam: 1.90 cm TAPSE (M-mode): 1.6 cm LEFT ATRIUM             Index        RIGHT ATRIUM           Index LA diam:        4.90 cm 2.45 cm/m   RA Area:     16.80 cm LA Vol  (A2C):  44.2 ml 22.11 ml/m  RA Volume:   44.30 ml  22.16 ml/m LA Vol (A4C):   55.8 ml 27.91 ml/m LA Biplane Vol: 54.3 ml 27.16 ml/m  AORTIC VALVE LVOT Vmax:   118.00 cm/s LVOT Vmean:  71.300 cm/s LVOT VTI:    0.202 m  AORTA Ao Root diam: 2.90 cm Ao Asc diam:  3.00 cm MITRAL VALVE               TRICUSPID VALVE MV Area (PHT): 4.21 cm    TR Peak grad:   54.8 mmHg MV Decel Time: 180 msec    TR Vmax:        370.00 cm/s MV E velocity: 98.00 cm/s MV A velocity: 44.50 cm/s  SHUNTS MV E/A ratio:  2.20        Systemic VTI:  0.20 m                            Systemic Diam: 2.20 cm Gwyndolyn Kaufman MD Electronically signed by Gwyndolyn Kaufman MD Signature Date/Time: 03/24/2022/3:22:14 PM    Final

## 2022-03-25 NOTE — Progress Notes (Signed)
PT Cancellation/Discharge Note  Patient Details Name: Levi Hodges MRN: [id number] DOB: 15-Jul-1976   Cancelled Treatment:    Reason Eval/Treat Not Completed: PT screened, no needs identified, will sign off  Per RN and family (pt sleeping), patient has been up walking independently, including in hallway. PT will sign off.   Arby Barrette, PT Acute [facility] Services  Pager [phone] Office [phone]  Rexanne Mano 03/25/2022, 12:09 PM

## 2022-03-26 ENCOUNTER — Inpatient Hospital Stay (HOSPITAL_COMMUNITY): Payer: Medicaid Other | Admitting: Anesthesiology

## 2022-03-26 ENCOUNTER — Encounter (HOSPITAL_COMMUNITY)
Admission: EM | Disposition: A | Discharge status: Home / Self Care | Payer: Self-pay | Source: Home / Self Care | Attending: Internal Medicine

## 2022-03-26 ENCOUNTER — Inpatient Hospital Stay (HOSPITAL_COMMUNITY): Payer: Medicaid Other

## 2022-03-26 ENCOUNTER — Encounter (HOSPITAL_COMMUNITY): Payer: Self-pay | Admitting: Internal Medicine

## 2022-03-26 DIAGNOSIS — N184 Chronic kidney disease, stage 4 (severe): Secondary | ICD-10-CM | POA: Diagnosis not present

## 2022-03-26 DIAGNOSIS — Z992 Dependence on renal dialysis: Secondary | ICD-10-CM

## 2022-03-26 DIAGNOSIS — N186 End stage renal disease: Secondary | ICD-10-CM

## 2022-03-26 DIAGNOSIS — Z7984 Long term (current) use of oral hypoglycemic drugs: Secondary | ICD-10-CM

## 2022-03-26 DIAGNOSIS — I120 Hypertensive chronic kidney disease with stage 5 chronic kidney disease or end stage renal disease: Secondary | ICD-10-CM

## 2022-03-26 DIAGNOSIS — E1122 Type 2 diabetes mellitus with diabetic chronic kidney disease: Secondary | ICD-10-CM

## 2022-03-26 HISTORY — PX: INSERTION OF DIALYSIS CATHETER: SHX1324

## 2022-03-26 HISTORY — PX: AV FISTULA PLACEMENT: SHX1204

## 2022-03-26 LAB — RENAL FUNCTION PANEL
Albumin: 2.7 g/dL — ABNORMAL LOW (ref 3.5–5.0)
Anion gap: 10 (ref 5–15)
BUN: 67 mg/dL — ABNORMAL HIGH (ref 6–20)
CO2: 17 mmol/L — ABNORMAL LOW (ref 22–32)
Calcium: 7.7 mg/dL — ABNORMAL LOW (ref 8.9–10.3)
Chloride: 112 mmol/L — ABNORMAL HIGH (ref 98–111)
Creatinine, Ser: 8.64 mg/dL — ABNORMAL HIGH (ref 0.61–1.24)
GFR, Estimated: 7 mL/min — ABNORMAL LOW (ref >60)
Glucose, Bld: 88 mg/dL (ref 70–99)
Phosphorus: 5.5 mg/dL — ABNORMAL HIGH (ref 2.5–4.6)
Potassium: 4.3 mmol/L (ref 3.5–5.1)
Sodium: 139 mmol/L (ref 135–145)

## 2022-03-26 LAB — GLUCOSE, CAPILLARY
Glucose-Capillary: 92 mg/dL (ref 70–99)
Glucose-Capillary: 70 mg/dL (ref 70–99)
Glucose-Capillary: 112 mg/dL — ABNORMAL HIGH (ref 70–99)
Glucose-Capillary: 93 mg/dL (ref 70–99)

## 2022-03-26 LAB — VITAMIN B12: Vitamin B-12: 502 pg/mL (ref 180–914)

## 2022-03-26 LAB — HEPATITIS B SURFACE ANTIBODY, QUANTITATIVE: Hep B S AB Quant (Post): <3.1 m[IU]/mL — ABNORMAL LOW (ref >9.9)

## 2022-03-26 LAB — TSH: TSH: 2.034 u[IU]/mL (ref 0.350–4.500)

## 2022-03-26 SURGERY — ARTERIOVENOUS (AV) FISTULA CREATION
Anesthesia: General | Site: Neck | Laterality: Right

## 2022-03-26 MED ORDER — FENTANYL CITRATE (PF) 100 MCG/2ML IJ SOLN: 25.0000 ug | INTRAMUSCULAR | Status: DC | PRN

## 2022-03-26 MED ORDER — CEFAZOLIN SODIUM-DEXTROSE 2-4 GM/100ML-% IV SOLN
INTRAVENOUS | Status: AC
  Filled 2022-03-26: qty 100

## 2022-03-26 MED ORDER — FENTANYL CITRATE (PF) 250 MCG/5ML IJ SOLN
INTRAMUSCULAR | Status: AC
  Filled 2022-03-26: qty 5

## 2022-03-26 MED ORDER — HEPARIN SODIUM (PORCINE) 1000 UNIT/ML IJ SOLN
INTRAMUSCULAR | Status: DC | PRN
  Administered 2022-03-26: 3200 [IU]

## 2022-03-26 MED ORDER — HEPARIN 6000 UNIT IRRIGATION SOLUTION
Status: DC | PRN
  Administered 2022-03-26: 1

## 2022-03-26 MED ORDER — PROPOFOL 10 MG/ML IV BOLUS
INTRAVENOUS | Status: AC
  Filled 2022-03-26: qty 20

## 2022-03-26 MED ORDER — CHLORHEXIDINE GLUCONATE 0.12 % MT SOLN: 15.0000 mL | Freq: Once | OROMUCOSAL | Status: AC

## 2022-03-26 MED ORDER — CEFAZOLIN SODIUM-DEXTROSE 2-3 GM-%(50ML) IV SOLR
INTRAVENOUS | Status: DC | PRN
  Administered 2022-03-26: 2 g via INTRAVENOUS

## 2022-03-26 MED ORDER — HYDROMORPHONE HCL 1 MG/ML IJ SOLN
0.5000 mg | Freq: Three times a day (TID) | INTRAMUSCULAR | Status: DC | PRN
  Administered 2022-03-26: 0.5 mg via INTRAVENOUS
  Filled 2022-03-26: qty 0.5

## 2022-03-26 MED ORDER — MIDAZOLAM HCL 2 MG/2ML IJ SOLN
INTRAMUSCULAR | Status: AC
  Filled 2022-03-26: qty 2

## 2022-03-26 MED ORDER — FENTANYL CITRATE (PF) 100 MCG/2ML IJ SOLN
INTRAMUSCULAR | Status: AC
  Filled 2022-03-26: qty 2

## 2022-03-26 MED ORDER — MIDAZOLAM HCL 2 MG/2ML IJ SOLN
INTRAMUSCULAR | Status: DC | PRN
  Administered 2022-03-26: 1 mg via INTRAVENOUS

## 2022-03-26 MED ORDER — LIDOCAINE 2% (20 MG/ML) 5 ML SYRINGE
INTRAMUSCULAR | Status: DC | PRN
  Administered 2022-03-26: 60 mg via INTRAVENOUS

## 2022-03-26 MED ORDER — DEXAMETHASONE SODIUM PHOSPHATE 10 MG/ML IJ SOLN
INTRAMUSCULAR | Status: DC | PRN
  Administered 2022-03-26: 4 mg via INTRAVENOUS

## 2022-03-26 MED ORDER — ORAL CARE MOUTH RINSE: 15.0000 mL | Freq: Once | OROMUCOSAL | Status: AC

## 2022-03-26 MED ORDER — LACTATED RINGERS IV SOLN: INTRAVENOUS | Status: DC

## 2022-03-26 MED ORDER — CHLORHEXIDINE GLUCONATE 0.12 % MT SOLN
OROMUCOSAL | Status: AC
  Administered 2022-03-26: 15 mL via OROMUCOSAL
  Filled 2022-03-26: qty 15

## 2022-03-26 MED ORDER — ONDANSETRON HCL 4 MG/2ML IJ SOLN
INTRAMUSCULAR | Status: AC
  Filled 2022-03-26: qty 2

## 2022-03-26 MED ORDER — FENTANYL CITRATE (PF) 250 MCG/5ML IJ SOLN
INTRAMUSCULAR | Status: DC | PRN
  Administered 2022-03-26 (×2): 50 ug via INTRAVENOUS
  Administered 2022-03-26: 25 ug via INTRAVENOUS

## 2022-03-26 MED ORDER — EPHEDRINE SULFATE-NACL 50-0.9 MG/10ML-% IV SOSY
PREFILLED_SYRINGE | INTRAVENOUS | Status: DC | PRN
Start: 2022-03-26 — End: 2022-03-26
  Administered 2022-03-26 (×2): 10 mg via INTRAVENOUS
  Administered 2022-03-26: 5 mg via INTRAVENOUS

## 2022-03-26 MED ORDER — SODIUM CHLORIDE 0.9 % IV SOLN: INTRAVENOUS | Status: DC

## 2022-03-26 MED ORDER — PHENYLEPHRINE HCL-NACL 20-0.9 MG/250ML-% IV SOLN
INTRAVENOUS | Status: DC | PRN
Start: 2022-03-26 — End: 2022-03-26
  Administered 2022-03-26 (×3): 80 ug via INTRAVENOUS
  Administered 2022-03-26 (×2): 160 ug via INTRAVENOUS
  Administered 2022-03-26: 80 ug via INTRAVENOUS
  Administered 2022-03-26: 160 ug via INTRAVENOUS

## 2022-03-26 MED ORDER — PROPOFOL 10 MG/ML IV BOLUS
INTRAVENOUS | Status: DC | PRN
  Administered 2022-03-26: 200 mg via INTRAVENOUS

## 2022-03-26 MED ORDER — LIDOCAINE-EPINEPHRINE (PF) 1 %-1:200000 IJ SOLN
INTRAMUSCULAR | Status: AC
  Filled 2022-03-26: qty 30

## 2022-03-26 MED ORDER — EPHEDRINE 5 MG/ML INJ
INTRAVENOUS | Status: AC
  Filled 2022-03-26: qty 5

## 2022-03-26 MED ORDER — HEPARIN SODIUM (PORCINE) 1000 UNIT/ML IJ SOLN
INTRAMUSCULAR | Status: AC
  Filled 2022-03-26: qty 10

## 2022-03-26 MED ORDER — SODIUM CHLORIDE 0.9 % IV SOLN
250.0000 mg | Freq: Once | INTRAVENOUS | Status: DC
  Filled 2022-03-26: qty 20

## 2022-03-26 MED ORDER — DEXTROSE 50 % IV SOLN
INTRAVENOUS | Status: AC
  Administered 2022-03-26: 25 mL
  Filled 2022-03-26: qty 50

## 2022-03-26 MED ORDER — OXYCODONE HCL 5 MG PO TABS: 5.0000 mg | ORAL_TABLET | Freq: Once | ORAL | Status: DC | PRN

## 2022-03-26 MED ORDER — ACETAMINOPHEN 10 MG/ML IV SOLN: 1000.0000 mg | Freq: Once | INTRAVENOUS | Status: DC | PRN

## 2022-03-26 MED ORDER — LIDOCAINE 2% (20 MG/ML) 5 ML SYRINGE
INTRAMUSCULAR | Status: AC
  Filled 2022-03-26: qty 15

## 2022-03-26 MED ORDER — OXYCODONE HCL 5 MG/5ML PO SOLN: 5.0000 mg | Freq: Once | ORAL | Status: DC | PRN

## 2022-03-26 MED ORDER — 0.9 % SODIUM CHLORIDE (POUR BTL) OPTIME
TOPICAL | Status: DC | PRN
  Administered 2022-03-26: 1000 mL

## 2022-03-26 MED ORDER — HEPARIN 6000 UNIT IRRIGATION SOLUTION
Status: AC
  Filled 2022-03-26: qty 500

## 2022-03-26 SURGICAL SUPPLY — 57 items
ARMBAND PINK RESTRICT EXTREMIT (MISCELLANEOUS) ×3 IMPLANT
BAG BANDED W/RUBBER/TAPE 36X54 (MISCELLANEOUS) ×1 IMPLANT
BAG DECANTER FOR FLEXI CONT (MISCELLANEOUS) ×3 IMPLANT
BIOPATCH RED 1 DISK 7.0 (GAUZE/BANDAGES/DRESSINGS) ×3 IMPLANT
CANISTER SUCT 3000ML PPV (MISCELLANEOUS) ×3 IMPLANT
CANNULA VESSEL 3MM 2 BLNT TIP (CANNULA) ×3 IMPLANT
CATH PALINDROME-P 19CM W/VT (CATHETERS) ×1 IMPLANT
CATH PALINDROME-P 23CM W/VT (CATHETERS) IMPLANT
CATH PALINDROME-P 28CM W/VT (CATHETERS) IMPLANT
CHLORAPREP W/TINT 26 (MISCELLANEOUS) ×4 IMPLANT
CLIP LIGATING EXTRA MED SLVR (CLIP) ×4 IMPLANT
CLIP LIGATING EXTRA SM BLUE (MISCELLANEOUS) ×4 IMPLANT
COVER DOME SNAP 22 D (MISCELLANEOUS) ×1 IMPLANT
COVER PROBE W GEL 5X96 (DRAPES) ×6 IMPLANT
COVER SURGICAL LIGHT HANDLE (MISCELLANEOUS) ×3 IMPLANT
DERMABOND ADVANCED (GAUZE/BANDAGES/DRESSINGS) ×6
DERMABOND ADVANCED .7 DNX12 (GAUZE/BANDAGES/DRESSINGS) IMPLANT
DRAPE CHEST BREAST 15X10 FENES (DRAPES) ×3 IMPLANT
ELECT REM PT RETURN 9FT ADLT (ELECTROSURGICAL) ×3
ELECTRODE REM PT RTRN 9FT ADLT (ELECTROSURGICAL) ×2 IMPLANT
GAUZE 4X4 16PLY ~~LOC~~+RFID DBL (SPONGE) ×3 IMPLANT
GAUZE SPONGE 4X4 12PLY STRL (GAUZE/BANDAGES/DRESSINGS) ×3 IMPLANT
GLOVE SURG SS PI 8.0 STRL IVOR (GLOVE) ×3 IMPLANT
GOWN STRL REUS W/ TWL LRG LVL3 (GOWN DISPOSABLE) ×4 IMPLANT
GOWN STRL REUS W/ TWL XL LVL3 (GOWN DISPOSABLE) ×2 IMPLANT
GOWN STRL REUS W/TWL LRG LVL3 (GOWN DISPOSABLE) ×12
GOWN STRL REUS W/TWL XL LVL3 (GOWN DISPOSABLE) ×6
INSERT FOGARTY SM (MISCELLANEOUS) IMPLANT
KIT BASIN OR (CUSTOM PROCEDURE TRAY) ×3 IMPLANT
KIT PALINDROME-P 55CM (CATHETERS) IMPLANT
KIT TURNOVER KIT B (KITS) ×3 IMPLANT
NDL 18GX1X1/2 (RX/OR ONLY) (NEEDLE) ×2 IMPLANT
NDL HYPO 25GX1X1/2 BEV (NEEDLE) ×2 IMPLANT
NEEDLE 18GX1X1/2 (RX/OR ONLY) (NEEDLE) ×3 IMPLANT
NEEDLE HYPO 25GX1X1/2 BEV (NEEDLE) IMPLANT
NS IRRIG 1000ML POUR BTL (IV SOLUTION) ×3 IMPLANT
PACK CV ACCESS (CUSTOM PROCEDURE TRAY) ×3 IMPLANT
PACK SURGICAL SETUP 50X90 (CUSTOM PROCEDURE TRAY) ×3 IMPLANT
PAD ARMBOARD 7.5X6 YLW CONV (MISCELLANEOUS) ×7 IMPLANT
SET MICROPUNCTURE 5F STIFF (MISCELLANEOUS) ×2 IMPLANT
SLING ARM FOAM STRAP LRG (SOFTGOODS) IMPLANT
SLING ARM FOAM STRAP MED (SOFTGOODS) IMPLANT
SOAP 2 % CHG 4 OZ (WOUND CARE) ×3 IMPLANT
SUT ETHILON 3 0 PS 1 (SUTURE) ×3 IMPLANT
SUT MNCRL AB 4-0 PS2 18 (SUTURE) ×4 IMPLANT
SUT PROLENE 6 0 BV (SUTURE) ×3 IMPLANT
SUT VIC AB 3-0 SH 27 (SUTURE) ×3
SUT VIC AB 3-0 SH 27X BRD (SUTURE) ×2 IMPLANT
SYR 10ML LL (SYRINGE) ×3 IMPLANT
SYR 20ML LL LF (SYRINGE) ×6 IMPLANT
SYR 3ML LL SCALE MARK (SYRINGE) IMPLANT
SYR 5ML LL (SYRINGE) ×3 IMPLANT
SYR CONTROL 10ML LL (SYRINGE) ×3 IMPLANT
TOWEL GREEN STERILE (TOWEL DISPOSABLE) ×3 IMPLANT
TOWEL GREEN STERILE FF (TOWEL DISPOSABLE) ×6 IMPLANT
UNDERPAD 30X36 HEAVY ABSORB (UNDERPADS AND DIAPERS) ×3 IMPLANT
WATER STERILE IRR 1000ML POUR (IV SOLUTION) ×3 IMPLANT

## 2022-03-26 NOTE — Anesthesia Preprocedure Evaluation (Addendum)
Anesthesia Evaluation  Patient identified by MRN, date of birth, ID band Patient awake    Reviewed: Allergy & Precautions, NPO status , Patient's Chart, lab work & pertinent test results  Airway Mallampati: II  TM Distance: >3 FB Neck ROM: Full    Dental  (+) Missing   Pulmonary Current Smoker and Patient abstained from smoking.,    Pulmonary exam normal        Cardiovascular hypertension, Pt. on medications Normal cardiovascular exam  ECHO: Left ventricular ejection fraction, by estimation, is 55 to 60%. The left ventricle has normal function. The left ventricle has no regional wall motion abnormalities. There is severe left ventricular hypertrophy. Left ventricular diastolic parameters are consistent with Grade II diastolic dysfunction (pseudonormalization). Elevated left atrial pressure. The average left ventricular global longitudinal strain is -18.0 % which is low normal. Pattern does not appear to be consistent with amyloid, however, could consider CMR in the future given severe LVH and speckled pattern on echo. 1. Right ventricular systolic function is mildly reduced. The right ventricular size is normal. There is severely elevated pulmonary artery systolic pressure. The estimated right ventricular systolic pressure is 56.3 mmHg. A small pericardial effusion is present. The pericardial effusion is posterior to the left ventricle. The mitral valve is normal in structure. Trivial mitral valve regurgitation. Tricuspid valve regurgitation is mild to moderate. The aortic valve is tricuspid. There is mild thickening of the aortic valve. Aortic valve regurgitation is not visualized. Aortic valve sclerosis is present, with no evidence of aortic valve stenosis. The inferior vena cava is dilated in size with <50% respiratory variability, suggesting right atrial pressure of 15 mmHg.   Neuro/Psych negative neurological ROS  negative  psych ROS   GI/Hepatic negative GI ROS, Neg liver ROS,   Endo/Other  diabetes, Insulin Dependent, Oral Hypoglycemic Agents  Renal/GU ESRFRenal disease     Musculoskeletal negative musculoskeletal ROS (+)   Abdominal   Peds  Hematology  (+) Blood dyscrasia, anemia ,   Anesthesia Other Findings End Stage Renal Disease  Reproductive/Obstetrics                           Anesthesia Physical Anesthesia Plan  ASA: 3  Anesthesia Plan: General   Post-op Pain Management:    Induction: Intravenous  PONV Risk Score and Plan: 1 and Ondansetron, Dexamethasone, Midazolam and Treatment may vary due to age or medical condition  Airway Management Planned: LMA  Additional Equipment:   Intra-op Plan:   Post-operative Plan: Extubation in OR  Informed Consent: I have reviewed the patients History and Physical, chart, labs and discussed the procedure including the risks, benefits and alternatives for the proposed anesthesia with the patient or authorized representative who has indicated his/her understanding and acceptance.     Dental advisory given  Plan Discussed with: CRNA and Surgeon  Anesthesia Plan Comments:        Anesthesia Quick Evaluation

## 2022-03-26 NOTE — Discharge Instructions (Signed)
[facility]  Discharge Instructions  AV Fistula or Graft Surgery for Dialysis Access  Please refer to the following instructions for your post-procedure care. Your surgeon or physician assistant will discuss any changes with you.  Activity  You may drive the day following your surgery, if you are comfortable and no longer taking prescription pain medication. Resume full activity as the soreness in your incision resolves.  Bathing/Showering  You may shower after you go home. Keep your incision dry for 48 hours. Do not soak in a bathtub, hot tub, or swim until the incision heals completely. You may not shower if you have a hemodialysis catheter.  Incision Care  Clean your incision with mild soap and water after 48 hours. Pat the area dry with a clean towel. You do not need a bandage unless otherwise instructed. Do not apply any ointments or creams to your incision. You may have skin glue on your incision. Do not peel it off. It will come off on its own in about one week. Your arm may swell a bit after surgery. To reduce swelling use pillows to elevate your arm so it is above your heart. Your doctor will tell you if you need to lightly wrap your arm with an ACE bandage.  Diet  Resume your normal diet. There are not special food restrictions following this procedure. In order to heal from your surgery, it is CRITICAL to get adequate nutrition. Your body requires vitamins, minerals, and protein. Vegetables are the best source of vitamins and minerals. Vegetables also provide the perfect balance of protein. Processed food has little nutritional value, so try to avoid this.  Medications  Resume taking all of your medications. If your incision is causing pain, you may take over-the counter pain relievers such as acetaminophen (Tylenol). If you were prescribed a stronger pain medication, please be aware these medications can cause nausea and constipation. Prevent  nausea by taking the medication with a snack or meal. Avoid constipation by drinking plenty of fluids and eating foods with high amount of fiber, such as fruits, vegetables, and grains.  Do not take Tylenol if you are taking prescription pain medications.  Follow up Your surgeon may want to see you in the office following your access surgery. If so, this will be arranged at the time of your surgery.  Please call us immediately for any of the following conditions:  Increased pain, redness, drainage (pus) from your incision site Fever of 101 degrees or higher Severe or worsening pain at your incision site Hand pain or numbness.  Reduce your risk of vascular disease:  Stop smoking. If you would like help, call [facility] at 1-800-QUIT-NOW 352-489-4924) or [place] at [place] your cholesterol Maintain a desired weight Control your diabetes Keep your blood pressure down  Dialysis  It will take several weeks to several months for your new dialysis access to be ready for use. Your surgeon will determine when it is okay to use it. Your nephrologist will continue to direct your dialysis. You can continue to use your Permcath until your new access is ready for use.   03/26/2022 Levi Hodges [id number] Jun 16, 1976  Surgeon(s): Cherre Robins, MD  Procedure(s): ARTERIOVENOUS (AV) FISTULA CREATION INSERTION OF DIALYSIS CATHETER USING PALINDROME PRECISOIN CHRONIC CATHETER KIT (19cm)   May stick graft immediately   May stick graft on designated area only:   X Do not stick left AV fistula for 12 weeks    If  you have any questions, please call the office at [phone].   Follow with Primary MD Hoyt Koch, MD in 7 days   Get CBC, CMP, 2 view Chest X ray -  checked next visit within 1 week by Primary MD    Activity: As tolerated with Full fall precautions use walker/cane & assistance as needed  Disposition Home   Diet: Renal Low carb.1200cc fluid  restriction/ day  Check your Weight same time everyday, if you gain over 2 pounds, or you develop in leg swelling, experience more shortness of breath or chest pain, call your Primary MD immediately. Follow Cardiac Low Salt Diet and 1.5 lit/day fluid restriction.  Special Instructions: If you have smoked or chewed Tobacco  in the last 2 yrs please stop smoking, stop any regular Alcohol  and or any Recreational drug use.  On your next visit with your primary care physician please Get Medicines reviewed and adjusted.  Please request your Prim.MD to go over all Hospital Tests and Procedure/Radiological results at the follow up, please get all Hospital records sent to your Prim MD by signing hospital release before you go home.  If you experience worsening of your admission symptoms, develop shortness of breath, life threatening emergency, suicidal or homicidal thoughts you must seek medical attention immediately by calling 911 or calling your MD immediately  if symptoms less severe.  You Must read complete instructions/literature along with all the possible adverse reactions/side effects for all the Medicines you take and that have been prescribed to you. Take any new Medicines after you have completely understood and accpet all the possible adverse reactions/side effects.    Accuchecks 4 times/day, Once in AM empty stomach and then before each meal. Log in all results and show them to your Prim.MD in 3 days. If any glucose reading is under 80 or above 300 call your Prim MD immidiately. Follow Low glucose instructions for glucose under 80 as instructed.

## 2022-03-26 NOTE — Progress Notes (Signed)
[facility] Progress Note   46 y.o. male with a history  of uncontrolled DM and HTN who presents for evaluation of CKD.  He also has a history of not taking his medications regularly due to cost and has been out of medications for well over a year.  He has had DM for about 13 years.  He has been on insulin around 8 years and also has h/o not taking insulin regularly due to cost.  He is presenting with hypertensive crisis and acute renal failure with a similar hospitalization in 02/2021. He is uninsured after losing his job and unable to afford any of his antihypertensive medications over the past 13 mths. Creatinine noted to be 8.77 up from 3.1-3.6 April of 2022. Appears to have an episode of acute on chronic kidney disease in April 2022 with improvement from 3.64 -> 3.12 over a course of days when he presented with hypertensive crisis.   In the ED CXR shows vascular congestion and he was given Lasix '20mg'$   in the ED. Systolic BP noted to be in the 230's treated with IV Labetalol pushes and IV diuresis.    Assessment/ Plan:   Acute on CKD IV - Kidney biopsy in 05/2019 c/w diabetic nephropathy with nodular glomerulosclerosis with moderate interstitial fibrosis and  tubular atrophy with no e/o immune complex disease. He almost certainly has had progression of kidney disease and question now is how advanced is the chronic component now.  - Will try to get his bp under better control as well as restart on anti-hypertensive regimen and see where his creatinine settles in at. But with the the h/o  already moderate interstitial fibrosis + tubular atrophy in 2020 + no medical treatment for over a year + anemia + anorexia for months and now nausea over past week all point to his renal disease progressing to ESRD. Cr unchanged this AM c/w advancing renal disease.   - Appreciate Dr. Carlis Abbott [facility] seeing the patient; he will get a TC + AV access for dialysis Wednesday -> will initiate dialysis once TC is in  place. 2nd treatment 2nd shift on Thur. 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   -Monitor Daily I/Os, Daily weight  -Avoid nephrotoxic medications including NSAIDs, contrast if at all possible. -PVR to ensure no obstructive uropathy; wrote order today and will f/u. Low suspicion for obstructive uropathy.   Hypertensive emergency  - On Amlodipine '10mg'$  daily, Metoprolol + diuretics; can start ACEI if needed once he's once dialysis. - Goal BP 30% decrease in 1st 24hrs to maintain adequate vital organ perfusion; oral medications are working and no need for IV gtt. Renal osteodystrophy - phos 5.6 -> started Renvela 1 TIDM and will recheck phos level later in the week. Anemia - Will dose IV iron today  and afterwards ESA  DM - A1c previously was 13 in 2020. Per primary. HLD  Subjective:   Denies fever/ chills/ nausea/ myalgias/ dysuria.   Objective:   BP (!) 143/83   Pulse 76   Temp 98.1 F (36.7 C) (Oral)   Resp 18   Ht '5\' 9"'$  (1.753 m)   Wt 76 kg   SpO2 98%   BMI 24.74 kg/m   Intake/Output Summary (Last 24 hours) at 03/26/2022 0721 Last data filed at 03/26/2022 3785 Gross per 24 hour  Intake 420 ml  Output 1025 ml  Net -605 ml  Weight change:   Physical Exam: GEN: NAD, A&Ox3, NCAT HEENT: No conjunctival pallor, EOMI NECK: Supple, no thyromegaly LUNGS: CTA B/L no rales, rhonchi or wheezing CV: RRR, No M/R/G ABD: SNDNT +BS  EXT: tr-1+ lower extremity edema   Imaging: CT CHEST WO CONTRAST  Result Date: 03/25/2022 CLINICAL DATA:  Lung nodule EXAM: CT CHEST WITHOUT CONTRAST TECHNIQUE: Multidetector CT imaging of the chest was performed following the standard protocol without IV contrast. RADIATION DOSE REDUCTION: This exam was performed according to the departmental dose-optimization program which includes automated exposure control, adjustment of the mA and/or kV according  to patient size and/or use of iterative reconstruction technique. COMPARISON:  Chest x-ray dated Mar 24, 2022 FINDINGS: Cardiovascular: Mild cardiomegaly. No pericardial effusion. No significant atherosclerotic disease of the thoracic aorta. No significant coronary artery calcifications. Mediastinum/Nodes: Thyroid is unremarkable. Enlarged mediastinal lymph nodes. Reference right lower paratracheal lymph node measuring 1.6 cm in short axis on series 3, image 25. Lungs/Pleura: Central airways are patent. Smooth bilateral interlobular septal thickening. Small bilateral pleural effusions. Bibasilar atelectasis. Upper Abdomen: No acute abnormality. Musculoskeletal: No chest wall mass or suspicious bone lesions identified. IMPRESSION: 1. No CT findings to correlate with nodular density seen in the lower right lung on prior chest x-ray, favored to be artifactual and due to vascular shadow. 2. Small bilateral pleural effusions, mild pulmonary edema, and atelectasis. 3. Mild cardiomegaly. 4. Enlarged mediastinal lymph nodes, likely reactive. Recommend follow-up chest CT in 3 months to ensure stability. Electronically Signed   By: Yetta Glassman M.D.   On: 03/25/2022 10:09   ECHOCARDIOGRAM COMPLETE  Result Date: 03/24/2022    ECHOCARDIOGRAM REPORT   Patient Name:   Levi Hodges Date of Exam: 03/24/2022 Medical Rec #:  [id number]       Height:       69.0 in Accession #:    [id number]      Weight:       185.2 lb Date of Birth:  1976-03-19        BSA:          2.000 m Patient Age:    23 years        BP:           193/114 mmHg Patient Gender: M               HR:           91 bpm. Exam Location:  Inpatient Procedure: 2D Echo, 3D Echo, Cardiac Doppler, Color Doppler and Strain Analysis Indications:    Elevated troponin. ; R60.0 Lower extremity edema  History:        Patient has no prior history of Echocardiogram examinations.                 Risk Factors:Current Smoker, Diabetes, Hypertension and                  Dyslipidemia. CKD.  Sonographer:    Roseanna Rainbow RDCS Referring Phys: [id number] RONDELL A SMITH  Sonographer Comments: Global longitudinal strain was attempted. Patient in fowler's position. Consult with nephrologist during echo. IMPRESSIONS  1. Left ventricular ejection fraction, by estimation, is 55 to 60%. The left ventricle has normal function. The left ventricle has no regional wall motion abnormalities. There is severe left ventricular hypertrophy. Left ventricular diastolic parameters  are consistent with Grade II diastolic dysfunction (pseudonormalization). Elevated left atrial pressure. The average left ventricular global longitudinal strain is -18.0 % which is low normal. Pattern does not appear  to be consistent with amyloid, however, could consider CMR in the future given severe LVH and speckled pattern on echo.  2. Right ventricular systolic function is mildly reduced. The right ventricular size is normal. There is severely elevated pulmonary artery systolic pressure. The estimated right ventricular systolic pressure is 08.6 mmHg.  3. A small pericardial effusion is present. The pericardial effusion is posterior to the left ventricle.  4. The mitral valve is normal in structure. Trivial mitral valve regurgitation.  5. Tricuspid valve regurgitation is mild to moderate.  6. The aortic valve is tricuspid. There is mild thickening of the aortic valve. Aortic valve regurgitation is not visualized. Aortic valve sclerosis is present, with no evidence of aortic valve stenosis.  7. The inferior vena cava is dilated in size with <50% respiratory variability, suggesting right atrial pressure of 15 mmHg. Comparison(s): No prior Echocardiogram. FINDINGS  Left Ventricle: Left ventricular ejection fraction, by estimation, is 55 to 60%. The left ventricle has normal function. The left ventricle has no regional wall motion abnormalities. The average left ventricular global longitudinal strain is -18.0 %. The left ventricular  internal cavity size was normal in size. There is severe left ventricular hypertrophy. Left ventricular diastolic parameters are consistent with Grade II diastolic dysfunction (pseudonormalization). Elevated left atrial pressure. Right Ventricle: The right ventricular size is normal. No increase in right ventricular wall thickness. Right ventricular systolic function is mildly reduced. There is severely elevated pulmonary artery systolic pressure. The tricuspid regurgitant velocity is 3.70 m/s, and with an assumed right atrial pressure of 15 mmHg, the estimated right ventricular systolic pressure is 57.8 mmHg. Left Atrium: Left atrial size was normal in size. Right Atrium: Right atrial size was normal in size. Pericardium: A small pericardial effusion is present. The pericardial effusion is posterior to the left ventricle. Mitral Valve: The mitral valve is normal in structure. Trivial mitral valve regurgitation. Tricuspid Valve: The tricuspid valve is normal in structure. Tricuspid valve regurgitation is mild to moderate. Aortic Valve: The aortic valve is tricuspid. There is mild thickening of the aortic valve. Aortic valve regurgitation is not visualized. Aortic valve sclerosis is present, with no evidence of aortic valve stenosis. Pulmonic Valve: The pulmonic valve was normal in structure. Pulmonic valve regurgitation is trivial. Aorta: The aortic root and ascending aorta are structurally normal, with no evidence of dilitation. Venous: The inferior vena cava is dilated in size with less than 50% respiratory variability, suggesting right atrial pressure of 15 mmHg. IAS/Shunts: The atrial septum is grossly normal.  LEFT VENTRICLE PLAX 2D LVIDd:         4.40 cm      Diastology LVIDs:         3.10 cm      LV e' medial:    4.90 cm/s LV PW:         1.80 cm      LV E/e' medial:  20.0 LV IVS:        1.40 cm      LV e' lateral:   7.30 cm/s LVOT diam:     2.20 cm      LV E/e' lateral: 13.4 LV SV:         77 LV SV Index:    38           2D Longitudinal Strain LVOT Area:     3.80 cm     2D Strain GLS Avg:     -18.0 %  LV Volumes (MOD) LV vol d, MOD  A2C: 113.0 ml LV vol d, MOD A4C: 79.3 ml LV vol s, MOD A2C: 41.1 ml LV vol s, MOD A4C: 34.7 ml LV SV MOD A2C:     71.9 ml LV SV MOD A4C:     79.3 ml LV SV MOD BP:      57.1 ml RIGHT VENTRICLE            IVC RV S prime:     9.41 cm/s  IVC diam: 1.90 cm TAPSE (M-mode): 1.6 cm LEFT ATRIUM             Index        RIGHT ATRIUM           Index LA diam:        4.90 cm 2.45 cm/m   RA Area:     16.80 cm LA Vol (A2C):   44.2 ml 22.11 ml/m  RA Volume:   44.30 ml  22.16 ml/m LA Vol (A4C):   55.8 ml 27.91 ml/m LA Biplane Vol: 54.3 ml 27.16 ml/m  AORTIC VALVE LVOT Vmax:   118.00 cm/s LVOT Vmean:  71.300 cm/s LVOT VTI:    0.202 m  AORTA Ao Root diam: 2.90 cm Ao Asc diam:  3.00 cm MITRAL VALVE               TRICUSPID VALVE MV Area (PHT): 4.21 cm    TR Peak grad:   54.8 mmHg MV Decel Time: 180 msec    TR Vmax:        370.00 cm/s MV E velocity: 98.00 cm/s MV A velocity: 44.50 cm/s  SHUNTS MV E/A ratio:  2.20        Systemic VTI:  0.20 m                            Systemic Diam: 2.20 cm Gwyndolyn Kaufman MD Electronically signed by Gwyndolyn Kaufman MD Signature Date/Time: 03/24/2022/3:22:14 PM    Final    VAS [place] LOWER EXTREMITY VENOUS (DVT)  Result Date: 03/25/2022  Lower Venous DVT Study Patient Name:  Levi Hodges  Date of Exam:   03/25/2022 Medical Rec #: [id number]        Accession #:    [id number] Date of Birth: August 23, 1976         Patient Gender: M Patient Age:   38 years Exam Location:  [facility] Procedure:      VAS [place] LOWER EXTREMITY VENOUS (DVT) Referring Phys: Fuller Plan --------------------------------------------------------------------------------  Indications: Swelling bilateral lower extremities.  Risk Factors: Occupation requires prolonged sitting. Comparison Study: No prior studies. Performing Technologist: Darlin Coco RDMS, RVT  Examination Guidelines: A complete  evaluation includes B-mode imaging, spectral Doppler, color Doppler, and power Doppler as needed of all accessible portions of each vessel. Bilateral testing is considered an integral part of a complete examination. Limited examinations for reoccurring indications may be performed as noted. The reflux portion of the exam is performed with the patient in reverse Trendelenburg.  +---------+---------------+---------+-----------+----------+--------------+ RIGHT    CompressibilityPhasicitySpontaneityPropertiesThrombus Aging +---------+---------------+---------+-----------+----------+--------------+ CFV      Full           Yes      Yes                                 +---------+---------------+---------+-----------+----------+--------------+ SFJ      Full                                                        +---------+---------------+---------+-----------+----------+--------------+  FV Prox  Full                                                        +---------+---------------+---------+-----------+----------+--------------+ FV Mid   Full                                                        +---------+---------------+---------+-----------+----------+--------------+ FV DistalFull                                                        +---------+---------------+---------+-----------+----------+--------------+ PFV      Full                                                        +---------+---------------+---------+-----------+----------+--------------+ POP      Full           Yes      Yes                                 +---------+---------------+---------+-----------+----------+--------------+ PTV      Full                                                        +---------+---------------+---------+-----------+----------+--------------+ PERO     Full                                                         +---------+---------------+---------+-----------+----------+--------------+ Gastroc  Full                                                        +---------+---------------+---------+-----------+----------+--------------+   +---------+---------------+---------+-----------+----------+--------------+ LEFT     CompressibilityPhasicitySpontaneityPropertiesThrombus Aging +---------+---------------+---------+-----------+----------+--------------+ CFV      Full           Yes      Yes                                 +---------+---------------+---------+-----------+----------+--------------+ SFJ      Full                                                        +---------+---------------+---------+-----------+----------+--------------+  FV Prox  Full                                                        +---------+---------------+---------+-----------+----------+--------------+ FV Mid   Full                                                        +---------+---------------+---------+-----------+----------+--------------+ FV DistalFull                                                        +---------+---------------+---------+-----------+----------+--------------+ PFV      Full                                                        +---------+---------------+---------+-----------+----------+--------------+ POP      Full           Yes      Yes                                 +---------+---------------+---------+-----------+----------+--------------+ PTV      Full                                                        +---------+---------------+---------+-----------+----------+--------------+ PERO     Full                                                        +---------+---------------+---------+-----------+----------+--------------+ Gastroc  Full                                                         +---------+---------------+---------+-----------+----------+--------------+     Summary: RIGHT: - There is no evidence of deep vein thrombosis in the lower extremity.  - No cystic structure found in the popliteal fossa.  LEFT: - There is no evidence of deep vein thrombosis in the lower extremity.  - No cystic structure found in the popliteal fossa.  *See table(s) above for measurements and observations. Electronically signed by Servando Snare MD on 03/25/2022 at 5:27:10 PM.    Final    VAS [place] UPPER EXT VEIN MAPPING (PRE-OP AVF)  Result Date: 03/25/2022 UPPER EXTREMITY VEIN MAPPING Patient Name:  Levi Hodges  Date of Exam:   03/25/2022 Medical Rec #: [id number]  Accession #:    [id number] Date of Birth: 04/04/1976         Patient Gender: M Patient Age:   25 years Exam Location:  [facility] Procedure:      VAS [place] UPPER EXT VEIN MAPPING (PRE-OP AVF) Referring Phys: Otelia Santee --------------------------------------------------------------------------------  Indications: Pre-access. History: CKD.  Comparison Study: No prior studies. Performing Technologist: Darlin Coco RDMS, RVT  Examination Guidelines: A complete evaluation includes B-mode imaging, spectral Doppler, color Doppler, and power Doppler as needed of all accessible portions of each vessel. Bilateral testing is considered an integral part of a complete examination. Limited examinations for reoccurring indications may be performed as noted. +-----------------+-------------+----------+-----------+ Right Cephalic   Diameter (cm)Depth (cm) Findings   +-----------------+-------------+----------+-----------+ Shoulder             0.32        0.33               +-----------------+-------------+----------+-----------+ Prox upper arm       0.31        0.25               +-----------------+-------------+----------+-----------+ Mid upper arm        0.25        0.20               +-----------------+-------------+----------+-----------+  Dist upper arm       0.28        0.22               +-----------------+-------------+----------+-----------+ Antecubital fossa    0.42        0.28   IV in place +-----------------+-------------+----------+-----------+ Prox forearm         0.27        0.18               +-----------------+-------------+----------+-----------+ Mid forearm          0.20        0.20               +-----------------+-------------+----------+-----------+ Dist forearm         0.21        0.20               +-----------------+-------------+----------+-----------+ Wrist                0.18        0.22               +-----------------+-------------+----------+-----------+ +-----------------+-------------+----------+---------+ Left Cephalic    Diameter (cm)Depth (cm)Findings  +-----------------+-------------+----------+---------+ Shoulder             0.23        0.26             +-----------------+-------------+----------+---------+ Prox upper arm       0.28        0.23             +-----------------+-------------+----------+---------+ Mid upper arm        0.24        0.23             +-----------------+-------------+----------+---------+ Dist upper arm       0.26        0.26             +-----------------+-------------+----------+---------+ Antecubital fossa    0.36        0.36             +-----------------+-------------+----------+---------+ Prox forearm  0.27        0.36   branching +-----------------+-------------+----------+---------+ Mid forearm          0.25        0.21             +-----------------+-------------+----------+---------+ Dist forearm         0.22        0.35   branching +-----------------+-------------+----------+---------+ Wrist                0.20        0.24             +-----------------+-------------+----------+---------+ +-----------------+-------------+----------+---------+ Left Basilic     Diameter (cm)Depth (cm)Findings   +-----------------+-------------+----------+---------+ Prox upper arm       0.42                         +-----------------+-------------+----------+---------+ Mid upper arm        0.32               branching +-----------------+-------------+----------+---------+ Dist upper arm       0.20                         +-----------------+-------------+----------+---------+ Antecubital fossa    0.19                         +-----------------+-------------+----------+---------+ Prox forearm         0.23                         +-----------------+-------------+----------+---------+ Mid forearm          0.14                         +-----------------+-------------+----------+---------+ Distal forearm       0.15                         +-----------------+-------------+----------+---------+ Wrist                0.12                         +-----------------+-------------+----------+---------+ *See table(s) above for measurements and observations.  Diagnosing physician: Servando Snare MD Electronically signed by Servando Snare MD on 03/25/2022 at 5:27:02 PM.    Final     Labs: BMET Recent Labs  Lab 03/24/22 0231 03/24/22 0610 03/25/22 0120 03/26/22 0159  NA 141  --  139 139  K 4.4  --  4.0 4.3  CL 114*  --  111 112*  CO2 18*  --  19* 17*  GLUCOSE 104*  --  167* 88  BUN 67*  --  68* 67*  CREATININE 8.77*  --  8.67* 8.64*  CALCIUM 7.7*  --  7.5* 7.7*  PHOS  --  5.7* 5.6* 5.5*   CBC Recent Labs  Lab 03/24/22 0231 03/24/22 2019 03/25/22 0120  WBC 7.0  --  5.7  NEUTROABS 4.3  --   --   HGB 7.9* 8.0* 7.4*  HCT 25.8* 24.4* 23.8*  MCV 87.2  --  85.6  PLT 382  --  345    Medications:     amLODipine  10 mg Oral Daily   carvedilol  6.25 mg Oral BID WC   Chlorhexidine Gluconate Cloth  6 each Topical  Q0600   cloNIDine  0.1 mg Oral TID   heparin injection (subcutaneous)  5,000 Units Subcutaneous Q8H   nicotine  21 mg Transdermal Daily   sevelamer carbonate  800 mg  Oral TID WC   sodium chloride flush  3 mL Intravenous Q12H      Otelia Santee, MD 03/26/2022, 7:21 AM

## 2022-03-26 NOTE — Progress Notes (Signed)
[facility] [place] PROGRESS NOTE  ASSESSMENT / PLAN: Levi Hodges is a 46 y.o. male with new diagnosis of ESRD. In need of permanent dialysis access. Plan RIJ TDC today in OR. Plan LUE AVG vs AVF today.    SUBJECTIVE: Reviewed OR plan. All questions answered.  OBJECTIVE: BP (!) 150/88   Pulse 74   Temp 98 F (36.7 C)   Resp 19   Ht '5\' 9"'$  (1.753 m)   Wt 76 kg   SpO2 97%   BMI 24.74 kg/m   Intake/Output Summary (Last 24 hours) at 03/26/2022 1249 Last data filed at 03/26/2022 4268 Gross per 24 hour  Intake 420 ml  Output 1025 ml  Net -605 ml    No distress 2+ radial pulses     Latest Ref Rng & Units 03/25/2022    1:20 AM 03/24/2022    8:19 PM 03/24/2022    2:31 AM  CBC  WBC 4.0 - 10.5 K/uL 5.7    7.0    Hemoglobin 13.0 - 17.0 g/dL 7.4   8.0   7.9    Hematocrit 39.0 - 52.0 % 23.8   24.4   25.8    Platelets 150 - 400 K/uL 345    382          Latest Ref Rng & Units 03/26/2022    1:59 AM 03/25/2022    1:20 AM 03/24/2022    2:31 AM  CMP  Glucose 70 - 99 mg/dL 88   167   104    BUN 6 - 20 mg/dL 67   68   67    Creatinine 0.61 - 1.24 mg/dL 8.64   8.67   8.77    Sodium 135 - 145 mmol/L 139   139   141    Potassium 3.5 - 5.1 mmol/L 4.3   4.0   4.4    Chloride 98 - 111 mmol/L 112   111   114    CO2 22 - 32 mmol/L '17   19   18    '$ Calcium 8.9 - 10.3 mg/dL 7.7   7.5   7.7    Total Protein 6.5 - 8.1 g/dL   6.8    Total Bilirubin 0.3 - 1.2 mg/dL   0.8    Alkaline Phos 38 - 126 U/L   162    AST 15 - 41 U/L   50    ALT 0 - 44 U/L   47      Estimated Creatinine Clearance: 10.7 mL/min (A) (by C-G formula based on SCr of 8.64 mg/dL (H)).  Levi Aline. Stanford Breed, MD [facility] Phone Number: [phone] 03/26/2022 12:49 PM

## 2022-03-26 NOTE — Anesthesia Postprocedure Evaluation (Signed)
Anesthesia Post Note  Patient: Levi Hodges  Procedure(s) Performed: ARTERIOVENOUS (AV) FISTULA CREATION (Left: Arm Upper) INSERTION OF DIALYSIS CATHETER USING PALINDROME PRECISOIN CHRONIC CATHETER KIT (19cm) (Right: Neck)     Patient location during evaluation: PACU Anesthesia Type: General Level of consciousness: awake and alert, patient cooperative and oriented Pain management: pain level controlled Vital Signs Assessment: post-procedure vital signs reviewed and stable Respiratory status: spontaneous breathing, nonlabored ventilation and respiratory function stable Cardiovascular status: blood pressure returned to baseline and stable Postop Assessment: no apparent nausea or vomiting Anesthetic complications: no   No notable events documented.  Last Vitals:  Vitals:   03/26/22 1515 03/26/22 1530  BP: 123/71 126/75  Pulse: 66 66  Resp: 16 16  Temp:  36.6 C  SpO2: 100% 100%    Last Pain:  Vitals:   03/26/22 1530  TempSrc:   PainSc: 0-No pain                 Shawntay Prest,E. Cornella Emmer

## 2022-03-26 NOTE — Op Note (Signed)
DATE OF SERVICE: 03/26/2022  PATIENT:  Levi Hodges  46 y.o. male  PRE-OPERATIVE DIAGNOSIS:  ESRD  POST-OPERATIVE DIAGNOSIS:  Same  PROCEDURE:   1) right internal jugular tunneled dialysis catheter (19cm) 2) left brachiocephalic arteriovenous fistula creation  SURGEON:  Surgeon(s) and Role:    * Cherre Robins, MD - Primary  ASSISTANT: Paulo Fruit, PA-C  An experienced assistant was required given the complexity of this procedure and the standard of surgical care. My assistant helped with exposure through counter tension, suctioning, ligation and retraction to better visualize the surgical field.  My assistant expedited sewing during the case by following my sutures. Wherever I use the term "we" in the report, my assistant actively helped me with that portion of the procedure.  ANESTHESIA:   general  EBL: minimal  BLOOD ADMINISTERED:none  DRAINS: none   LOCAL MEDICATIONS USED:  NONE  SPECIMEN:  none  COUNTS: confirmed correct.  TOURNIQUET:  none  PATIENT DISPOSITION:  PACU - hemodynamically stable.   Delay start of Pharmacological VTE agent (>24hrs) due to surgical blood loss or risk of bleeding: no  INDICATION FOR PROCEDURE: Levi Hodges is a 46 y.o. male with ESRD in need of dialysis access. After careful discussion of risks, benefits, and alternatives the patient was offered tunneled dialysis catheter and creation of permanent dialysis access in his left arm. The patient understood and wished to proceed.  OPERATIVE FINDINGS: Unremarkable TDC. Cephalic vein better than anticipated. Unremarkable BC AVF.  DESCRIPTION OF PROCEDURE: After identification of the patient in the pre-operative holding area, the patient was transferred to the operating room. The patient was positioned supine on the operating room table. Anesthesia was induced. The right neck / chest and left arm were prepped and draped in standard fashion. A surgical pause was performed confirming correct  patient, procedure, and operative location.  Using ultrasound guidance the right internal jugular vein was accessed with micropuncture technique.  Through the micropuncture sheath a floppy J-wire was advanced into the superior vena cava.  A small incision was made around the skin access point.  The access point was serially dilated under direct fluoroscopic guidance.  A peel-away sheath was introduced into the superior vena cava under fluoroscopic guidance.  A counterincision was made in the chest under the clavicle.  A 19 cm tunnel dialysis catheter was then tunneled under the skin, over the clavicle into the incision in the neck.  The tunneling device was removed and the catheter fed through the peel-away sheath into the superior vena cava.  The peel-away sheath was removed and the catheter gently pulled back.  Adequate position was confirmed with x-ray.  The catheter was tested and found to flush and draw back well.  Catheter was heparin locked.  Caps were applied.  Catheter was sutured to the skin.  The neck incision was closed with 4-0 Monocryl.  Using intraoperative ultrasound, the course of the left upper extremity superficial veins was mapped.  The cephalic vein appeared adequate for arteriovenous fistula creation.  The brachial artery was similarly mapped.  The artery appeared adequate for arterial venous creation. We ensured there was no anomalous arterial anatomy such as a high bifurcation.  A transverse incision was made in the left arm just distal to the antecubital crease.  Incision was carried down through subcutaneous tissue until the cephalic vein was identified and skeletonized.  We continued our exposure through the aponeurosis of the biceps.  The brachial artery was encountered its usual position.  The artery was  circumferentially exposed and encircled with Silastic Vesseloops.  Patient was systemically heparinized.  The distal cephalic vein was transected.  The distal stump of the  cephalic vein was oversewn with a 2-0 silk suture.  The proximal vein was controlled with a bulldog clamp.  The brachial artery was clamped proximally medially.  An anterior arteriotomy was made with a 11 blade.  The arteriotomy was extended with Potts scissors.  Using a parachute technique the cephalic vein was anastomosed to the brachial arteriotomy in end-to-side fashion with continuous running suture of 6-0 Prolene.  Immediately prior to completion the anastomosis was flushed and de-aired.  The anastomosis was completed.  Hemostasis was assured.  The fistula was interrogated with Doppler. Audible bruit was heard throughout the course of the cephalic vein.  A left radial artery signal was heard which augmented slightly with compression of the fistula.  Upon completion of the case instrument and sharps counts were confirmed correct. The patient was transferred to the PACU in good condition. I was present for all portions of the procedure.  [name]. Stanford Breed, MD [facility] Phone Number: [phone] 03/26/2022 2:32 PM

## 2022-03-26 NOTE — Progress Notes (Signed)
PROGRESS NOTE                                                                                                                                                                                                             Patient Demographics:    Levi Hodges, is a 46 y.o. male, DOB - August 15, 1976, KCL:[id number]  Outpatient Primary MD for the patient is Hoyt Koch, MD    LOS - 2  Admit date - 03/24/2022    Chief Complaint  Patient presents with   SOB / Legs Swelling    Hypertensive       Brief Narrative (HPI from H&P)   a 46 y.o. male with medical history significant of hypertension, diabetes mellitus type 2,  chronic kidney disease stage IV, and tobacco abuse who presents with complaints of progressively worsening swelling of his legs and shortness of breath over the last month, he was diagnosed with CKD 5 now progressing to ESRD along with fluid overload, hypertensive urgency and admitted to the hospital   Subjective:   Patient in bed, appears comfortable, denies any headache, no fever, no chest pain or pressure, no shortness of breath , no abdominal pain. No new focal weakness.   Assessment  & Plan :    CKD 5 now progressing to ESRD.  Due to medication noncompliance caused by lack of insurance, nephrology on board he is going to get temporary dialysis catheter placed by vascular surgery and dialysis to commence on 03/26/2022, further management per nephrology.  2.  Hypertensive emergency.  Due to lack of medications caused by lack of insurance, placed on appropriate medications and doses adjusted further on 03/25/2022.  Continue to monitor and adjust gradually.  3.  Anemia of chronic disease.  Monitor no signs of ongoing acute blood loss.  4.  Acute on chronic diastolic CHF due to fluid overload built by renal dysfunction.  Fluid removal through HD now, received IV Lasix on 03/24/2022.  Currently much improved  clinically.  5.  Hypertensive emergency causing mild troponin rise, troponin rise in non-ACS pattern and trend is flat, EKG nonacute, with controlling of blood pressure chest pain resolved.  6.  Metabolic acidosis.  Kindly see #1 above.  7.  Smoking.  Counseled to quit.  8.  7 mm right lower lobe lung nodule on chest x-ray.  CT  chest does not show a nodule but did show mediastinal lymphadenopathy of nonspecific etiology, outpatient follow-up with pulmonary or PCP.  9.  DM type II.  Diet controlled, monitor.  Lab Results  Component Value Date   HGBA1C 5.4 03/24/2022        Condition -   Guarded  Family Communication  : Wife bedside on 03/26/2022  Code Status :  Full  Consults  :  VVS, Renal  PUD Prophylaxis :    Procedures  :     TTE - 1. Left ventricular ejection fraction, by estimation, is 55 to 60%. The left ventricle has normal function. The left ventricle has no regional wall motion abnormalities. There is severe left ventricular hypertrophy. Left ventricular diastolic parameters  are consistent with Grade II diastolic dysfunction (pseudonormalization). Elevated left atrial pressure. The average left ventricular global longitudinal strain is -18.0 % which is low normal. Pattern does not appear to be consistent with amyloid, however, could consider CMR in the future given severe LVH and speckled pattern on echo.  2. Right ventricular systolic function is mildly reduced. The right ventricular size is normal. There is severely elevated pulmonary artery systolic pressure. The estimated right ventricular systolic pressure is 37.1 mmHg.  3. A small pericardial effusion is present. The pericardial effusion is posterior to the left ventricle.  4. The mitral valve is normal in structure. Trivial mitral valve regurgitation.  5. Tricuspid valve regurgitation is mild to moderate.  6. The aortic valve is tricuspid. There is mild thickening of the aortic valve. Aortic valve regurgitation is not  visualized. Aortic valve sclerosis is present, with no evidence of aortic valve stenosis.  7. The inferior vena cava is dilated in size with <50% respiratory variability, suggesting right atrial pressure of 15 mmHg.  CT chest  Temporary dialysis catheter placement on 03/25/2022 due.      Disposition Plan  :    Status is: Inpatient  DVT Prophylaxis  :  Heparin  heparin injection 5,000 Units Start: 03/25/22 1400 SCDs Start: 03/24/22 0852 SCDs Start: 03/24/22 0630    Lab Results  Component Value Date   PLT 345 03/25/2022    Diet :  Diet Order             Diet NPO time specified Except for: Sips with Meds  Diet effective midnight                    Inpatient Medications  Scheduled Meds:  amLODipine  10 mg Oral Daily   carvedilol  6.25 mg Oral BID WC   Chlorhexidine Gluconate Cloth  6 each Topical Q0600   cloNIDine  0.1 mg Oral TID   heparin injection (subcutaneous)  5,000 Units Subcutaneous Q8H   nicotine  21 mg Transdermal Daily   sevelamer carbonate  800 mg Oral TID WC   sodium chloride flush  3 mL Intravenous Q12H   Continuous Infusions:  sodium chloride     sodium chloride     ferric gluconate (FERRLECIT) IVPB     PRN Meds:.sodium chloride, sodium chloride, acetaminophen **OR** acetaminophen, alteplase, heparin, hydrALAZINE, labetalol, lidocaine (PF), lidocaine-prilocaine, ondansetron **OR** ondansetron (ZOFRAN) IV, pentafluoroprop-tetrafluoroeth  Antibiotics  :    Anti-infectives (From admission, onward)    None        Time Spent in minutes  30   Lala Lund M.D on 03/26/2022 at 10:28 AM  To page go to [email or web address]   [facility] -  Office  [phone]  See all Orders  from today for further details    Objective:   Vitals:   03/26/22 0600 03/26/22 0624 03/26/22 0756 03/26/22 0805  BP: (!) 159/96 (!) 143/83 (!) 161/86 (!) 141/82  Pulse: 80 76 78 82  Resp: '20 18 19   '$ Temp:   98.6 F (37 C)   TempSrc:   Oral   SpO2: 99% 98%  98%   Weight:      Height:        Wt Readings from Last 3 Encounters:  03/26/22 76 kg  03/02/21 77.2 kg  09/21/19 85.3 kg     Intake/Output Summary (Last 24 hours) at 03/26/2022 1028 Last data filed at 03/26/2022 5427 Gross per 24 hour  Intake 420 ml  Output 1025 ml  Net -605 ml     Physical Exam  Awake Alert, No new F.N deficits, Normal affect [place].AT,PERRAL Supple Neck, No JVD,   Symmetrical Chest wall movement, Good air movement bilaterally, CTAB RRR,No Gallops, Rubs or new Murmurs,  +ve B.Sounds, Abd Soft, No tenderness,   No Cyanosis, Clubbing or edema        Data Review:    CBC Recent Labs  Lab 03/24/22 0231 03/24/22 2019 03/25/22 0120  WBC 7.0  --  5.7  HGB 7.9* 8.0* 7.4*  HCT 25.8* 24.4* 23.8*  PLT 382  --  345  MCV 87.2  --  85.6  MCH 26.7  --  26.6  MCHC 30.6  --  31.1  RDW 16.2*  --  16.2*  LYMPHSABS 1.3  --   --   MONOABS 0.8  --   --   EOSABS 0.6*  --   --   BASOSABS 0.1  --   --     Electrolytes Recent Labs  Lab 03/24/22 0231 03/24/22 0921 03/24/22 1400 03/24/22 2019 03/25/22 0120 03/26/22 0159  NA 141  --   --   --  139 139  K 4.4  --   --   --  4.0 4.3  CL 114*  --   --   --  111 112*  CO2 18*  --   --   --  19* 17*  GLUCOSE 104*  --   --   --  167* 88  BUN 67*  --   --   --  68* 67*  CREATININE 8.77*  --   --   --  8.67* 8.64*  CALCIUM 7.7*  --   --   --  7.5* 7.7*  AST 50*  --   --   --   --   --   ALT 47*  --   --   --   --   --   ALKPHOS 162*  --   --   --   --   --   BILITOT 0.8  --   --   --   --   --   ALBUMIN 3.3*  --   --   --  2.9* 2.7*  DDIMER  --   --   --  1.42*  --   --   INR  --  1.1  --   --   --   --   HGBA1C  --   --  5.4  --   --   --   BNP 1,666.4*  --   --   --   --   --        Micro Results No results found for this or any  previous visit (from the past 240 hour(s)).  Radiology Reports DG Chest 2 View  Result Date: 03/24/2022 CLINICAL DATA:  Shortness of breath. EXAM: CHEST - 2 VIEW  COMPARISON:  Chest radiograph dated 03/01/2021. FINDINGS: Mild diffuse chronic interstitial coarsening. No focal consolidation, pleural effusion, pneumothorax. Faint 7 mm nodular density is in the right lower lung field may be artifactual or related to nipple shadow. A pulmonary nodule is not excluded. Attention on follow-up imaging recommended. Top-normal cardiac silhouette. No acute osseous pathology. IMPRESSION: 1. No acute cardiopulmonary process. 2. Faint 7 mm nodular density in the right lower lung field. Attention on follow-up imaging recommended. Electronically Signed   By: Anner Crete M.D.   On: 03/24/2022 03:06   CT CHEST WO CONTRAST  Result Date: 03/25/2022 CLINICAL DATA:  Lung nodule EXAM: CT CHEST WITHOUT CONTRAST TECHNIQUE: Multidetector CT imaging of the chest was performed following the standard protocol without IV contrast. RADIATION DOSE REDUCTION: This exam was performed according to the departmental dose-optimization program which includes automated exposure control, adjustment of the mA and/or kV according to patient size and/or use of iterative reconstruction technique. COMPARISON:  Chest x-ray dated Mar 24, 2022 FINDINGS: Cardiovascular: Mild cardiomegaly. No pericardial effusion. No significant atherosclerotic disease of the thoracic aorta. No significant coronary artery calcifications. Mediastinum/Nodes: Thyroid is unremarkable. Enlarged mediastinal lymph nodes. Reference right lower paratracheal lymph node measuring 1.6 cm in short axis on series 3, image 25. Lungs/Pleura: Central airways are patent. Smooth bilateral interlobular septal thickening. Small bilateral pleural effusions. Bibasilar atelectasis. Upper Abdomen: No acute abnormality. Musculoskeletal: No chest wall mass or suspicious bone lesions identified. IMPRESSION: 1. No CT findings to correlate with nodular density seen in the lower right lung on prior chest x-ray, favored to be artifactual and due to vascular shadow.  2. Small bilateral pleural effusions, mild pulmonary edema, and atelectasis. 3. Mild cardiomegaly. 4. Enlarged mediastinal lymph nodes, likely reactive. Recommend follow-up chest CT in 3 months to ensure stability. Electronically Signed   By: Yetta Glassman M.D.   On: 03/25/2022 10:09   ECHOCARDIOGRAM COMPLETE  Result Date: 03/24/2022    ECHOCARDIOGRAM REPORT   Patient Name:   Levi Hodges Date of Exam: 03/24/2022 Medical Rec #:  [id number]       Height:       69.0 in Accession #:    [id number]      Weight:       185.2 lb Date of Birth:  Jan 25, 1976        BSA:          2.000 m Patient Age:    69 years        BP:           193/114 mmHg Patient Gender: M               HR:           91 bpm. Exam Location:  Inpatient Procedure: 2D Echo, 3D Echo, Cardiac Doppler, Color Doppler and Strain Analysis Indications:    Elevated troponin. ; R60.0 Lower extremity edema  History:        Patient has no prior history of Echocardiogram examinations.                 Risk Factors:Current Smoker, Diabetes, Hypertension and                 Dyslipidemia. CKD.  Sonographer:    Roseanna Rainbow RDCS Referring Phys: [id number] [facility] A SMITH  Sonographer Comments: Global longitudinal  strain was attempted. Patient in fowler's position. Consult with nephrologist during echo. IMPRESSIONS  1. Left ventricular ejection fraction, by estimation, is 55 to 60%. The left ventricle has normal function. The left ventricle has no regional wall motion abnormalities. There is severe left ventricular hypertrophy. Left ventricular diastolic parameters  are consistent with Grade II diastolic dysfunction (pseudonormalization). Elevated left atrial pressure. The average left ventricular global longitudinal strain is -18.0 % which is low normal. Pattern does not appear to be consistent with amyloid, however, could consider CMR in the future given severe LVH and speckled pattern on echo.  2. Right ventricular systolic function is mildly reduced. The right  ventricular size is normal. There is severely elevated pulmonary artery systolic pressure. The estimated right ventricular systolic pressure is 82.4 mmHg.  3. A small pericardial effusion is present. The pericardial effusion is posterior to the left ventricle.  4. The mitral valve is normal in structure. Trivial mitral valve regurgitation.  5. Tricuspid valve regurgitation is mild to moderate.  6. The aortic valve is tricuspid. There is mild thickening of the aortic valve. Aortic valve regurgitation is not visualized. Aortic valve sclerosis is present, with no evidence of aortic valve stenosis.  7. The inferior vena cava is dilated in size with <50% respiratory variability, suggesting right atrial pressure of 15 mmHg. Comparison(s): No prior Echocardiogram. FINDINGS  Left Ventricle: Left ventricular ejection fraction, by estimation, is 55 to 60%. The left ventricle has normal function. The left ventricle has no regional wall motion abnormalities. The average left ventricular global longitudinal strain is -18.0 %. The left ventricular internal cavity size was normal in size. There is severe left ventricular hypertrophy. Left ventricular diastolic parameters are consistent with Grade II diastolic dysfunction (pseudonormalization). Elevated left atrial pressure. Right Ventricle: The right ventricular size is normal. No increase in right ventricular wall thickness. Right ventricular systolic function is mildly reduced. There is severely elevated pulmonary artery systolic pressure. The tricuspid regurgitant velocity is 3.70 m/s, and with an assumed right atrial pressure of 15 mmHg, the estimated right ventricular systolic pressure is 23.5 mmHg. Left Atrium: Left atrial size was normal in size. Right Atrium: Right atrial size was normal in size. Pericardium: A small pericardial effusion is present. The pericardial effusion is posterior to the left ventricle. Mitral Valve: The mitral valve is normal in structure. Trivial  mitral valve regurgitation. Tricuspid Valve: The tricuspid valve is normal in structure. Tricuspid valve regurgitation is mild to moderate. Aortic Valve: The aortic valve is tricuspid. There is mild thickening of the aortic valve. Aortic valve regurgitation is not visualized. Aortic valve sclerosis is present, with no evidence of aortic valve stenosis. Pulmonic Valve: The pulmonic valve was normal in structure. Pulmonic valve regurgitation is trivial. Aorta: The aortic root and ascending aorta are structurally normal, with no evidence of dilitation. Venous: The inferior vena cava is dilated in size with less than 50% respiratory variability, suggesting right atrial pressure of 15 mmHg. IAS/Shunts: The atrial septum is grossly normal.  LEFT VENTRICLE PLAX 2D LVIDd:         4.40 cm      Diastology LVIDs:         3.10 cm      LV e' medial:    4.90 cm/s LV PW:         1.80 cm      LV E/e' medial:  20.0 LV IVS:        1.40 cm      LV e' lateral:  7.30 cm/s LVOT diam:     2.20 cm      LV E/e' lateral: 13.4 LV SV:         77 LV SV Index:   38           2D Longitudinal Strain LVOT Area:     3.80 cm     2D Strain GLS Avg:     -18.0 %  LV Volumes (MOD) LV vol d, MOD A2C: 113.0 ml LV vol d, MOD A4C: 79.3 ml LV vol s, MOD A2C: 41.1 ml LV vol s, MOD A4C: 34.7 ml LV SV MOD A2C:     71.9 ml LV SV MOD A4C:     79.3 ml LV SV MOD BP:      57.1 ml RIGHT VENTRICLE            IVC RV S prime:     9.41 cm/s  IVC diam: 1.90 cm TAPSE (M-mode): 1.6 cm LEFT ATRIUM             Index        RIGHT ATRIUM           Index LA diam:        4.90 cm 2.45 cm/m   RA Area:     16.80 cm LA Vol (A2C):   44.2 ml 22.11 ml/m  RA Volume:   44.30 ml  22.16 ml/m LA Vol (A4C):   55.8 ml 27.91 ml/m LA Biplane Vol: 54.3 ml 27.16 ml/m  AORTIC VALVE LVOT Vmax:   118.00 cm/s LVOT Vmean:  71.300 cm/s LVOT VTI:    0.202 m  AORTA Ao Root diam: 2.90 cm Ao Asc diam:  3.00 cm MITRAL VALVE               TRICUSPID VALVE MV Area (PHT): 4.21 cm    TR Peak grad:   54.8  mmHg MV Decel Time: 180 msec    TR Vmax:        370.00 cm/s MV E velocity: 98.00 cm/s MV A velocity: 44.50 cm/s  SHUNTS MV E/A ratio:  2.20        Systemic VTI:  0.20 m                            Systemic Diam: 2.20 cm Gwyndolyn Kaufman MD Electronically signed by Gwyndolyn Kaufman MD Signature Date/Time: 03/24/2022/3:22:14 PM    Final    VAS [place] LOWER EXTREMITY VENOUS (DVT)  Result Date: 03/25/2022  Lower Venous DVT Study Patient Name:  VIOLET CART  Date of Exam:   03/25/2022 Medical Rec #: [id number]        Accession #:    [id number] Date of Birth: November 26, 1975         Patient Gender: M Patient Age:   61 years Exam Location:  [facility] Procedure:      VAS [place] LOWER EXTREMITY VENOUS (DVT) Referring Phys: Fuller Plan --------------------------------------------------------------------------------  Indications: Swelling bilateral lower extremities.  Risk Factors: Occupation requires prolonged sitting. Comparison Study: No prior studies. Performing Technologist: Darlin Coco RDMS, RVT  Examination Guidelines: A complete evaluation includes B-mode imaging, spectral Doppler, color Doppler, and power Doppler as needed of all accessible portions of each vessel. Bilateral testing is considered an integral part of a complete examination. Limited examinations for reoccurring indications may be performed as noted. The reflux portion of the exam is performed with the patient in reverse Trendelenburg.  +---------+---------------+---------+-----------+----------+--------------+  RIGHT    CompressibilityPhasicitySpontaneityPropertiesThrombus Aging +---------+---------------+---------+-----------+----------+--------------+ CFV      Full           Yes      Yes                                 +---------+---------------+---------+-----------+----------+--------------+ SFJ      Full                                                         +---------+---------------+---------+-----------+----------+--------------+ FV Prox  Full                                                        +---------+---------------+---------+-----------+----------+--------------+ FV Mid   Full                                                        +---------+---------------+---------+-----------+----------+--------------+ FV DistalFull                                                        +---------+---------------+---------+-----------+----------+--------------+ PFV      Full                                                        +---------+---------------+---------+-----------+----------+--------------+ POP      Full           Yes      Yes                                 +---------+---------------+---------+-----------+----------+--------------+ PTV      Full                                                        +---------+---------------+---------+-----------+----------+--------------+ PERO     Full                                                        +---------+---------------+---------+-----------+----------+--------------+ Gastroc  Full                                                        +---------+---------------+---------+-----------+----------+--------------+   +---------+---------------+---------+-----------+----------+--------------+  LEFT     CompressibilityPhasicitySpontaneityPropertiesThrombus Aging +---------+---------------+---------+-----------+----------+--------------+ CFV      Full           Yes      Yes                                 +---------+---------------+---------+-----------+----------+--------------+ SFJ      Full                                                        +---------+---------------+---------+-----------+----------+--------------+ FV Prox  Full                                                         +---------+---------------+---------+-----------+----------+--------------+ FV Mid   Full                                                        +---------+---------------+---------+-----------+----------+--------------+ FV DistalFull                                                        +---------+---------------+---------+-----------+----------+--------------+ PFV      Full                                                        +---------+---------------+---------+-----------+----------+--------------+ POP      Full           Yes      Yes                                 +---------+---------------+---------+-----------+----------+--------------+ PTV      Full                                                        +---------+---------------+---------+-----------+----------+--------------+ PERO     Full                                                        +---------+---------------+---------+-----------+----------+--------------+ Gastroc  Full                                                        +---------+---------------+---------+-----------+----------+--------------+  Summary: RIGHT: - There is no evidence of deep vein thrombosis in the lower extremity.  - No cystic structure found in the popliteal fossa.  LEFT: - There is no evidence of deep vein thrombosis in the lower extremity.  - No cystic structure found in the popliteal fossa.  *See table(s) above for measurements and observations. Electronically signed by Servando Snare MD on 03/25/2022 at 5:27:10 PM.    Final    VAS [place] UPPER EXT VEIN MAPPING (PRE-OP AVF)  Result Date: 03/25/2022 UPPER EXTREMITY VEIN MAPPING Patient Name:  [facility]  Date of Exam:   03/25/2022 Medical Rec #: [id number]        Accession #:    [id number] Date of Birth: November 30, 1975         Patient Gender: M Patient Age:   66 years Exam Location:  [facility] Procedure:      VAS [place] UPPER EXT VEIN MAPPING (PRE-OP AVF) Referring  Phys: Otelia Santee --------------------------------------------------------------------------------  Indications: Pre-access. History: CKD.  Comparison Study: No prior studies. Performing Technologist: Darlin Coco RDMS, RVT  Examination Guidelines: A complete evaluation includes B-mode imaging, spectral Doppler, color Doppler, and power Doppler as needed of all accessible portions of each vessel. Bilateral testing is considered an integral part of a complete examination. Limited examinations for reoccurring indications may be performed as noted. +-----------------+-------------+----------+-----------+ Right Cephalic   Diameter (cm)Depth (cm) Findings   +-----------------+-------------+----------+-----------+ Shoulder             0.32        0.33               +-----------------+-------------+----------+-----------+ Prox upper arm       0.31        0.25               +-----------------+-------------+----------+-----------+ Mid upper arm        0.25        0.20               +-----------------+-------------+----------+-----------+ Dist upper arm       0.28        0.22               +-----------------+-------------+----------+-----------+ Antecubital fossa    0.42        0.28   IV in place +-----------------+-------------+----------+-----------+ Prox forearm         0.27        0.18               +-----------------+-------------+----------+-----------+ Mid forearm          0.20        0.20               +-----------------+-------------+----------+-----------+ Dist forearm         0.21        0.20               +-----------------+-------------+----------+-----------+ Wrist                0.18        0.22               +-----------------+-------------+----------+-----------+ +-----------------+-------------+----------+---------+ Left Cephalic    Diameter (cm)Depth (cm)Findings  +-----------------+-------------+----------+---------+ Shoulder             0.23         0.26             +-----------------+-------------+----------+---------+ Prox upper arm       0.28  0.23             +-----------------+-------------+----------+---------+ Mid upper arm        0.24        0.23             +-----------------+-------------+----------+---------+ Dist upper arm       0.26        0.26             +-----------------+-------------+----------+---------+ Antecubital fossa    0.36        0.36             +-----------------+-------------+----------+---------+ Prox forearm         0.27        0.36   branching +-----------------+-------------+----------+---------+ Mid forearm          0.25        0.21             +-----------------+-------------+----------+---------+ Dist forearm         0.22        0.35   branching +-----------------+-------------+----------+---------+ Wrist                0.20        0.24             +-----------------+-------------+----------+---------+ +-----------------+-------------+----------+---------+ Left Basilic     Diameter (cm)Depth (cm)Findings  +-----------------+-------------+----------+---------+ Prox upper arm       0.42                         +-----------------+-------------+----------+---------+ Mid upper arm        0.32               branching +-----------------+-------------+----------+---------+ Dist upper arm       0.20                         +-----------------+-------------+----------+---------+ Antecubital fossa    0.19                         +-----------------+-------------+----------+---------+ Prox forearm         0.23                         +-----------------+-------------+----------+---------+ Mid forearm          0.14                         +-----------------+-------------+----------+---------+ Distal forearm       0.15                         +-----------------+-------------+----------+---------+ Wrist                0.12                          +-----------------+-------------+----------+---------+ *See table(s) above for measurements and observations.  Diagnosing physician: Servando Snare MD Electronically signed by Servando Snare MD on 03/25/2022 at 5:27:02 PM.    Final

## 2022-03-26 NOTE — Transfer of Care (Signed)
Immediate Anesthesia Transfer of Care Note  Patient: Levi Hodges  Procedure(s) Performed: ARTERIOVENOUS (AV) FISTULA CREATION (Left: Arm Upper) INSERTION OF DIALYSIS CATHETER USING PALINDROME PRECISOIN CHRONIC CATHETER KIT (19cm) (Right: Neck)  Patient Location: PACU  Anesthesia Type:General  Level of Consciousness: drowsy and patient cooperative  Airway & Oxygen Therapy: Patient Spontanous Breathing  Post-op Assessment: Report given to RN and Post -op Vital signs reviewed and stable  Post vital signs: Reviewed and stable  Last Vitals:  Vitals Value Taken Time  BP 132/83   Temp    Pulse 68   Resp 15   SpO2 96     Last Pain:  Vitals:   03/26/22 0756  TempSrc: Oral  PainSc:       Patients Stated Pain Goal: 5 (52/58/94 8347)  Complications: No notable events documented.

## 2022-03-26 NOTE — Plan of Care (Signed)
                                      [name]                            [address] [place], [place] [address]      Levi Hodges was admitted to the Hospital on 03/24/2022 and still in the Hospital and should be excused from work/  for 21 days starting from date -  03/24/2022 .  Call Lala Lund MD, [facility]  [phone] with questions.  Lala Lund M.D on 03/26/2022,at 8:10 AM  [facility]   Office  [phone]

## 2022-03-26 NOTE — Progress Notes (Signed)
Called to rm STAT by staff.  Felicia NT sitting outside of rm for IVC pt in rm 6. Wife became extremely agitated and verbal because Felicia wouldn't leave her job to take her husband to the BR. Wife and pt yelling at [facility] NT and calling her an "ignorant bitch. That doesn't know what the hell she is doing." I ran down to the rm and went in and wife continued to yell at [facility] NT stating she kept looking in here but wouldn't get up and help. I close the door to help diffuse the situation and attempted to talk and explain things calmly to the wife and pt. Pt initially started yelling at me to back off and not to touch him. I backed up and again started talking to the wife and pt. I explained the NT sitting outside the rm was specifically for that pt. The wife then asked when that happened and I stated shift chg at 7 pm. Wife then yelled 'you guys are ignorant and don't know what you're doing." She was just in here and hooked him up. I looked out and seen that it was [place] NT and not the sitter and attempted to appologize for my error and she slammed the door and yelled "you're all fucking idiots and don't know what you're doing." " I keep having problems with the nurses and I'm sick of it. My husband doesn't deserve this kind of care and I don't deserve to be called names." Wife left abruptly. Husband slightly more calm and asked it the SR could be let down so he could go to the BR. Monitor equipment disconnected and SR lowered so pt could go to the BR. "Pt insisting SL be D/C'd because there was a scant amt of bld under the drsg. Informed the pt I would take it out but another one would have to be started incase we need access for something. Pt stated he understood but he wanted it out now." Security was called by Evelena Peat NS and by time they came up wife was gone. They went down to the rm and talked to pt. Security stated next time she comes to call immediately and they will come up and talk with her. Security  stated the husband stated it was a big misunderstanding.

## 2022-03-26 NOTE — Anesthesia Procedure Notes (Signed)
Procedure Name: LMA Insertion Date/Time: 03/26/2022 12:48 PM Performed by: Barrington Ellison, CRNA Pre-anesthesia Checklist: Patient identified, Emergency Drugs available, Suction available and Patient being monitored Patient Re-evaluated:Patient Re-evaluated prior to induction Oxygen Delivery Method: Circle System Utilized Preoxygenation: Pre-oxygenation with 100% oxygen Induction Type: IV induction Ventilation: Mask ventilation without difficulty LMA: LMA inserted LMA Size: 5.0 Number of attempts: 1 Placement Confirmation: positive ETCO2 and breath sounds checked- equal and bilateral Tube secured with: Tape Dental Injury: Teeth and Oropharynx as per pre-operative assessment

## 2022-03-26 NOTE — Plan of Care (Signed)
Nutrition Education Note  RD consulted for Renal Education. Provided "Nutrition for People on Dialysis" and "All About Protein for People on HD" to pt and pt wife.   Explained why diet restrictions are needed and provided lists of foods to limit/avoid that are high potassium, sodium, and phosphorus. Provided specific recommendations on safer alternatives of these foods. Strongly encouraged compliance of this diet.   Discussed importance of protein intake at each meal and snack. Provided examples of how to maximize protein intake throughout the day. Discussed need for fluid restriction with dialysis, importance of minimizing weight gain between HD treatments, and renal-friendly beverage options.  Reviewed ways to treat hypoglycemic episodes with pt. Pt endorses using glucose tabs or soda to treat currently at home.  Encouraged pt to discuss specific diet questions/concerns with RD at HD outpatient facility. Teach back method used.  Expect good compliance.  Body mass index is 24.74 kg/m. Pt meets criteria for normal based on current BMI.  Pt reports that his appetite is currently good; hungry but NPO for plan for fistula this afternoon.   Current diet order is NPO (plan for fistula placement today), patient is consuming approximately 100% of meals at this time. Labs and medications reviewed.   No further nutrition interventions warranted at this time. If additional nutrition issues arise, please re-consult RD.   Hermina Barters RD, LDN Clinical Dietitian See Shea Evans for contact information.

## 2022-03-27 ENCOUNTER — Other Ambulatory Visit (HOSPITAL_COMMUNITY): Payer: Self-pay

## 2022-03-27 ENCOUNTER — Encounter (HOSPITAL_COMMUNITY): Payer: Self-pay | Admitting: Vascular Surgery

## 2022-03-27 DIAGNOSIS — N179 Acute kidney failure, unspecified: Principal | ICD-10-CM

## 2022-03-27 DIAGNOSIS — R778 Other specified abnormalities of plasma proteins: Secondary | ICD-10-CM

## 2022-03-27 LAB — RENAL FUNCTION PANEL
Albumin: 2.7 g/dL — ABNORMAL LOW (ref 3.5–5.0)
Anion gap: 10 (ref 5–15)
BUN: 48 mg/dL — ABNORMAL HIGH (ref 6–20)
CO2: 22 mmol/L (ref 22–32)
Calcium: 7.7 mg/dL — ABNORMAL LOW (ref 8.9–10.3)
Chloride: 102 mmol/L (ref 98–111)
Creatinine, Ser: 6.73 mg/dL — ABNORMAL HIGH (ref 0.61–1.24)
GFR, Estimated: 10 mL/min — ABNORMAL LOW (ref >60)
Glucose, Bld: 262 mg/dL — ABNORMAL HIGH (ref 70–99)
Phosphorus: 5.7 mg/dL — ABNORMAL HIGH (ref 2.5–4.6)
Potassium: 4.2 mmol/L (ref 3.5–5.1)
Sodium: 134 mmol/L — ABNORMAL LOW (ref 135–145)

## 2022-03-27 LAB — CBC
HCT: 24.8 % — ABNORMAL LOW (ref 39.0–52.0)
Hemoglobin: 7.8 g/dL — ABNORMAL LOW (ref 13.0–17.0)
MCH: 26.7 pg (ref 26.0–34.0)
MCHC: 31.5 g/dL (ref 30.0–36.0)
MCV: 84.9 fL (ref 80.0–100.0)
Platelets: 333 10*3/uL (ref 150–400)
RBC: 2.92 MIL/uL — ABNORMAL LOW (ref 4.22–5.81)
RDW: 15.8 % — ABNORMAL HIGH (ref 11.5–15.5)
WBC: 9.4 10*3/uL (ref 4.0–10.5)
nRBC: 0 % (ref 0.0–0.2)

## 2022-03-27 LAB — GLUCOSE, CAPILLARY: Glucose-Capillary: 106 mg/dL — ABNORMAL HIGH (ref 70–99)

## 2022-03-27 MED ORDER — DARBEPOETIN ALFA 150 MCG/0.3ML IJ SOSY
150.0000 ug | PREFILLED_SYRINGE | Freq: Once | INTRAMUSCULAR | Status: AC
  Administered 2022-03-27: 150 ug via INTRAVENOUS
  Filled 2022-03-27: qty 0.3

## 2022-03-27 MED ORDER — SODIUM CHLORIDE 0.9 % IV SOLN
250.0000 mg | Freq: Once | INTRAVENOUS | Status: AC
  Administered 2022-03-27: 250 mg via INTRAVENOUS
  Filled 2022-03-27: qty 20

## 2022-03-27 MED ORDER — CARVEDILOL 6.25 MG PO TABS
6.2500 mg | ORAL_TABLET | Freq: Two times a day (BID) | ORAL | 0 refills | Status: DC
  Filled 2022-03-27: qty 60, 30d supply, fill #0

## 2022-03-27 MED ORDER — METFORMIN HCL 500 MG PO TABS
500.0000 mg | ORAL_TABLET | Freq: Two times a day (BID) | ORAL | 0 refills | Status: DC
  Filled 2022-03-27: qty 60, 30d supply, fill #0

## 2022-03-27 MED ORDER — HYDRALAZINE HCL 50 MG PO TABS
50.0000 mg | ORAL_TABLET | Freq: Three times a day (TID) | ORAL | 0 refills | Status: DC
  Filled 2022-03-27: qty 90, 30d supply, fill #0

## 2022-03-27 MED ORDER — SIMVASTATIN 20 MG PO TABS
ORAL_TABLET | ORAL | 0 refills | Status: DC
Start: 2022-03-27 — End: 2022-04-25
  Filled 2022-03-27: qty 30, 30d supply, fill #0

## 2022-03-27 MED ORDER — AMLODIPINE BESYLATE 10 MG PO TABS
10.0000 mg | ORAL_TABLET | Freq: Every day | ORAL | 0 refills | Status: DC
  Filled 2022-03-27: qty 30, 30d supply, fill #0

## 2022-03-27 MED ORDER — CLONIDINE HCL 0.1 MG PO TABS
0.1000 mg | ORAL_TABLET | Freq: Three times a day (TID) | ORAL | 0 refills | Status: DC
  Filled 2022-03-27: qty 90, 30d supply, fill #0

## 2022-03-27 MED ORDER — SEVELAMER CARBONATE 800 MG PO TABS
800.0000 mg | ORAL_TABLET | Freq: Three times a day (TID) | ORAL | 0 refills | Status: DC
  Filled 2022-03-27: qty 90, 30d supply, fill #0

## 2022-03-27 NOTE — Progress Notes (Signed)
[facility]  Subjective  - no issues, no symptoms of steal   Objective 129/79 71 98.3 F (36.8 C) (Oral) 16 100%  Intake/Output Summary (Last 24 hours) at 03/27/2022 0734 Last data filed at 03/26/2022 2000 Gross per 24 hour  Intake 400 ml  Output 1005 ml  Net -605 ml   Left UE incision healing well without erythema or edema Palpable radial pulses, palpable thrill in fistula.  Left N/V/M intact Lungs non  labored breathing Tight TDC dressing clean and dry   Assessment/Planning: POD # 1  Right TDC placement and left UE AV BC fistula creation for ESRD  Keep TDC clean and dry F/U in 4-5 weeks with fistula duplex to check for maturity of fistula.  Do not stick fistula for 12 weeks. Please call if VVS is needed stable post op disposition   Roxy Horseman 03/27/2022 7:34 AM --  Laboratory Lab Results: Recent Labs    03/25/22 0120 03/27/22 0646  WBC 5.7 9.4  HGB 7.4* 7.8*  HCT 23.8* 24.8*  PLT 345 333   BMET Recent Labs    03/26/22 0159 03/27/22 0356  NA 139 134*  K 4.3 4.2  CL 112* 102  CO2 17* 22  GLUCOSE 88 262*  BUN 67* 48*  CREATININE 8.64* 6.73*  CALCIUM 7.7* 7.7*    COAG Lab Results  Component Value Date   INR 1.1 03/24/2022   INR 1.1 05/24/2019   No results found for: PTT

## 2022-03-27 NOTE — Plan of Care (Signed)

## 2022-03-27 NOTE — Progress Notes (Signed)
Pt has been financially cleared and accepted at [facility] on a MWF schedule. Pt can start tomorrow. Pt needs to arrive at 12:00 for 12:30 chair time (tomorrow only). On Monday and thereafter, pt will need to arrive at 11:30 for 11:45 chair time. Met with pt and pt's wife at bedside. Discussed above details and schedule letter provided. Pt and pt's wife agreeable to plan and voice understanding. Appt details added to pt's AVS as well. Contacted attending and nephrologist to provide update. Pt will d/c today and start at clinic tomorrow. Contacted renal PA regarding clinic's need for orders. Spoke to Dawn at [facility] to make her aware that pt will d/c today and that pt will start tomorrow.   Melven Sartorius Renal Navigator [phone]

## 2022-03-27 NOTE — Discharge Summary (Addendum)
Levi Hodges DVV:[id number] DOB: 05/09/1976 DOA: 03/24/2022  PCP: Hoyt Koch, MD  Admit date: 03/24/2022  Discharge date: 03/27/2022  Admitted From: Home   Disposition:  Home   Recommendations for Outpatient Follow-up:   Follow up with PCP in 1-2 weeks  PCP Please obtain BMP/CBC, 2 view CXR in 1week,  (see Discharge instructions)   PCP Please follow up on the following pending results: Monitor BP, CBGs   Home Health: None   Equipment/Devices: None  Consultations: Nephro Discharge Condition: Stable    CODE STATUS: Full    Diet Recommendation: Renal Low Carb, 1200 cc fluid restriction    Chief Complaint  Patient presents with   SOB / Legs Swelling    Hypertensive     Brief history of present illness from the day of admission and additional interim summary    46 y.o. male with medical history significant of hypertension, diabetes mellitus type 2,  chronic kidney disease stage IV, and tobacco abuse who presents with complaints of progressively worsening swelling of his legs and shortness of breath over the last month, he was diagnosed with CKD 5 now progressing to ESRD along with fluid overload, hypertensive urgency and admitted to the hospital. Per patient was out of all Meds for > 61mhs.                                                                 Hospital Course      CKD 5 now progressing to ESRD.  Due to medication noncompliance caused by lack of insurance, nephrology on board he is going to get temporary dialysis catheter placed by vascular surgery and dialysis was started on 03/26/2022, further management per nephrology. Per Renal DC today, patient threatening to leave AMA.   2.  Hypertensive emergency.  Due to lack of medications caused by lack of insurance, placed on appropriate  medications and doses adjusted further on 03/25/2022.  PCP to monitor.   3.  Anemia of chronic disease.  Monitor no signs of ongoing acute blood loss.   4.  Acute on chronic diastolic CHF due to fluid overload built by renal dysfunction.  Fluid removal through HD now, received IV Lasix on 03/24/2022.  Currently much improved clinically now on RA.   5.  Hypertensive emergency causing mild troponin rise, troponin rise in non-ACS pattern and trend is flat, EKG nonacute, with controlling of blood pressure chest pain resolved.   6.  Metabolic acidosis.  Kindly see #1 above.   7.  Smoking.  Counseled to quit.   8.  7 mm right lower lobe lung nodule on chest x-ray.  CT chest does not show a nodule but did show mediastinal lymphadenopathy of nonspecific etiology, outpatient follow-up with pulmonary or PCP.   9.  DM type II.  Diet controlled,  monitor by PCP  Lab Results  Component Value Date   HGBA1C 5.4 03/24/2022      Discharge diagnosis     Principal Problem:   ARF (acute renal failure) (HCC) Active Problems:   Fluid overload   Hypertensive emergency   Normocytic anemia   Elevated troponin   Swelling of lower extremity   Metabolic acidosis   Well controlled type 2 diabetes mellitus ([place])   Pulmonary nodule   Tobacco abuse    Discharge instructions    Discharge Instructions     Discharge instructions   Complete by: As directed    Follow with Primary MD Hoyt Koch, MD in 7 days   Get CBC, CMP, 2 view Chest X ray -  checked next visit within 1 week by Primary MD    Activity: As tolerated with Full fall precautions use walker/cane & assistance as needed  Disposition Home   Diet: Renal Low carb.1200cc fluid restriction/ day  Check your Weight same time everyday, if you gain over 2 pounds, or you develop in leg swelling, experience more shortness of breath or chest pain, call your Primary MD immediately. Follow Cardiac Low Salt Diet and 1.5 lit/day fluid  restriction.  Special Instructions: If you have smoked or chewed Tobacco  in the last 2 yrs please stop smoking, stop any regular Alcohol  and or any Recreational drug use.  On your next visit with your primary care physician please Get Medicines reviewed and adjusted.  Please request your Prim.MD to go over all Hospital Tests and Procedure/Radiological results at the follow up, please get all Hospital records sent to your Prim MD by signing hospital release before you go home.  If you experience worsening of your admission symptoms, develop shortness of breath, life threatening emergency, suicidal or homicidal thoughts you must seek medical attention immediately by calling 911 or calling your MD immediately  if symptoms less severe.  You Must read complete instructions/literature along with all the possible adverse reactions/side effects for all the Medicines you take and that have been prescribed to you. Take any new Medicines after you have completely understood and accpet all the possible adverse reactions/side effects.   Discharge wound care:   Complete by: As directed    Keep your Catheter clean and dry at all times   Increase activity slowly   Complete by: As directed        Discharge Medications   Allergies as of 03/27/2022   No Known Allergies      Medication List     STOP taking these medications    glimepiride 1 MG tablet Commonly known as: Amaryl   metoprolol tartrate 25 MG tablet Commonly known as: LOPRESSOR       TAKE these medications    acetaminophen 325 MG tablet Commonly known as: TYLENOL Take 650 mg by mouth every 6 (six) hours as needed for headache.   amLODipine 10 MG tablet Commonly known as: NORVASC Take 1 tablet (10 mg total) by mouth daily. Start taking on: Mar 28, 2022 What changed:  how much to take how to take this when to take this additional instructions   carvedilol 6.25 MG tablet Commonly known as: COREG Take 1 tablet (6.25 mg  total) by mouth 2 (two) times daily with a meal.   cloNIDine 0.1 MG tablet Commonly known as: CATAPRES Take 1 tablet (0.1 mg total) by mouth 3 (three) times daily. What changed: when to take this   glucose blood test strip Commonly  known as: Archivist daily   hydrALAZINE 50 MG tablet Commonly known as: APRESOLINE Take 1 tablet (50 mg total) by mouth 3 (three) times daily.   OneTouch Verio Flex System w/Device Kit 1 Device by Does not apply route as directed.   Pen Needles 31G X 5 MM Misc 1 Package by Does not apply route daily.   sevelamer carbonate 800 MG tablet Commonly known as: RENVELA Take 1 tablet (800 mg total) by mouth 3 (three) times daily with meals.   simvastatin 20 MG tablet Commonly known as: ZOCOR TAKE 1 TABLET(20 MG) BY MOUTH AT BEDTIME               Discharge Care Instructions  (From admission, onward)           Start     Ordered   03/27/22 0000  Discharge wound care:       Comments: Keep your Catheter clean and dry at all times   03/27/22 1555             Follow-up Information     [facility] Follow up in 6 week(s).   Why: The office will call the patient with an appointment(sent) Contact information: [address] [place]        Hoyt Koch, MD. Schedule an appointment as soon as possible for a visit in 1 week(s).   Specialty: Internal Medicine Contact information: [place] [address] [phone]         Garner Nash, DO. Schedule an appointment as soon as possible for a visit in 1 week(s).   Specialty: Pulmonary Disease Contact information: [place] [address] [phone]                 Major procedures and Radiology Reports - PLEASE review detailed and final reports thoroughly  -       DG Chest 2 View  Result Date: 03/24/2022 CLINICAL DATA:  Shortness of breath. EXAM:  CHEST - 2 VIEW COMPARISON:  Chest radiograph dated 03/01/2021. FINDINGS: Mild diffuse chronic interstitial coarsening. No focal consolidation, pleural effusion, pneumothorax. Faint 7 mm nodular density is in the right lower lung field may be artifactual or related to nipple shadow. A pulmonary nodule is not excluded. Attention on follow-up imaging recommended. Top-normal cardiac silhouette. No acute osseous pathology. IMPRESSION: 1. No acute cardiopulmonary process. 2. Faint 7 mm nodular density in the right lower lung field. Attention on follow-up imaging recommended. Electronically Signed   By: Anner Crete M.D.   On: 03/24/2022 03:06   CT CHEST WO CONTRAST  Result Date: 03/25/2022 CLINICAL DATA:  Lung nodule EXAM: CT CHEST WITHOUT CONTRAST TECHNIQUE: Multidetector CT imaging of the chest was performed following the standard protocol without IV contrast. RADIATION DOSE REDUCTION: This exam was performed according to the departmental dose-optimization program which includes automated exposure control, adjustment of the mA and/or kV according to patient size and/or use of iterative reconstruction technique. COMPARISON:  Chest x-ray dated Mar 24, 2022 FINDINGS: Cardiovascular: Mild cardiomegaly. No pericardial effusion. No significant atherosclerotic disease of the thoracic aorta. No significant coronary artery calcifications. Mediastinum/Nodes: Thyroid is unremarkable. Enlarged mediastinal lymph nodes. Reference right lower paratracheal lymph node measuring 1.6 cm in short axis on series 3, image 25. Lungs/Pleura: Central airways are patent. Smooth bilateral interlobular septal thickening. Small bilateral pleural effusions. Bibasilar atelectasis. Upper Abdomen: No acute abnormality. Musculoskeletal: No chest wall mass or  suspicious bone lesions identified. IMPRESSION: 1. No CT findings to correlate with nodular density seen in the lower right lung on prior chest x-ray, favored to be artifactual and due to  vascular shadow. 2. Small bilateral pleural effusions, mild pulmonary edema, and atelectasis. 3. Mild cardiomegaly. 4. Enlarged mediastinal lymph nodes, likely reactive. Recommend follow-up chest CT in 3 months to ensure stability. Electronically Signed   By: Yetta Glassman M.D.   On: 03/25/2022 10:09   DG Chest Port 1 View  Result Date: 03/26/2022 CLINICAL DATA:  Central line placement. EXAM: PORTABLE CHEST 1 VIEW COMPARISON:  Mar 24, 2021. FINDINGS: Stable cardiomegaly. Right internal jugular catheter is noted with distal tip in expected position of the SVC. No pneumothorax is noted. Both lungs are clear. The visualized skeletal structures are unremarkable. IMPRESSION: Right internal jugular catheter with distal tip in expected position of the SVC. No pneumothorax is noted. Electronically Signed   By: Marijo Conception M.D.   On: 03/26/2022 16:22   ECHOCARDIOGRAM COMPLETE  Result Date: 03/24/2022    ECHOCARDIOGRAM REPORT   Patient Name:   DAKSH COATES Date of Exam: 03/24/2022 Medical Rec #:  [id number]       Height:       69.0 in Accession #:    [id number]      Weight:       185.2 lb Date of Birth:  1976/04/08        BSA:          2.000 m Patient Age:    46 years        BP:           193/114 mmHg Patient Gender: M               HR:           91 bpm. Exam Location:  Inpatient Procedure: 2D Echo, 3D Echo, Cardiac Doppler, Color Doppler and Strain Analysis Indications:    Elevated troponin. ; R60.0 Lower extremity edema  History:        Patient has no prior history of Echocardiogram examinations.                 Risk Factors:Current Smoker, Diabetes, Hypertension and                 Dyslipidemia. CKD.  Sonographer:    Roseanna Rainbow RDCS Referring Phys: [id number] RONDELL A SMITH  Sonographer Comments: Global longitudinal strain was attempted. Patient in fowler's position. Consult with nephrologist during echo. IMPRESSIONS  1. Left ventricular ejection fraction, by estimation, is 55 to 60%. The left ventricle has  normal function. The left ventricle has no regional wall motion abnormalities. There is severe left ventricular hypertrophy. Left ventricular diastolic parameters  are consistent with Grade II diastolic dysfunction (pseudonormalization). Elevated left atrial pressure. The average left ventricular global longitudinal strain is -18.0 % which is low normal. Pattern does not appear to be consistent with amyloid, however, could consider CMR in the future given severe LVH and speckled pattern on echo.  2. Right ventricular systolic function is mildly reduced. The right ventricular size is normal. There is severely elevated pulmonary artery systolic pressure. The estimated right ventricular systolic pressure is 03.4 mmHg.  3. A small pericardial effusion is present. The pericardial effusion is posterior to the left ventricle.  4. The mitral valve is normal in structure. Trivial mitral valve regurgitation.  5. Tricuspid valve regurgitation is mild to moderate.  6. The aortic valve is tricuspid. There  is mild thickening of the aortic valve. Aortic valve regurgitation is not visualized. Aortic valve sclerosis is present, with no evidence of aortic valve stenosis.  7. The inferior vena cava is dilated in size with <50% respiratory variability, suggesting right atrial pressure of 15 mmHg. Comparison(s): No prior Echocardiogram. FINDINGS  Left Ventricle: Left ventricular ejection fraction, by estimation, is 55 to 60%. The left ventricle has normal function. The left ventricle has no regional wall motion abnormalities. The average left ventricular global longitudinal strain is -18.0 %. The left ventricular internal cavity size was normal in size. There is severe left ventricular hypertrophy. Left ventricular diastolic parameters are consistent with Grade II diastolic dysfunction (pseudonormalization). Elevated left atrial pressure. Right Ventricle: The right ventricular size is normal. No increase in right ventricular wall  thickness. Right ventricular systolic function is mildly reduced. There is severely elevated pulmonary artery systolic pressure. The tricuspid regurgitant velocity is 3.70 m/s, and with an assumed right atrial pressure of 15 mmHg, the estimated right ventricular systolic pressure is 70.7 mmHg. Left Atrium: Left atrial size was normal in size. Right Atrium: Right atrial size was normal in size. Pericardium: A small pericardial effusion is present. The pericardial effusion is posterior to the left ventricle. Mitral Valve: The mitral valve is normal in structure. Trivial mitral valve regurgitation. Tricuspid Valve: The tricuspid valve is normal in structure. Tricuspid valve regurgitation is mild to moderate. Aortic Valve: The aortic valve is tricuspid. There is mild thickening of the aortic valve. Aortic valve regurgitation is not visualized. Aortic valve sclerosis is present, with no evidence of aortic valve stenosis. Pulmonic Valve: The pulmonic valve was normal in structure. Pulmonic valve regurgitation is trivial. Aorta: The aortic root and ascending aorta are structurally normal, with no evidence of dilitation. Venous: The inferior vena cava is dilated in size with less than 50% respiratory variability, suggesting right atrial pressure of 15 mmHg. IAS/Shunts: The atrial septum is grossly normal.  LEFT VENTRICLE PLAX 2D LVIDd:         4.40 cm      Diastology LVIDs:         3.10 cm      LV e' medial:    4.90 cm/s LV PW:         1.80 cm      LV E/e' medial:  20.0 LV IVS:        1.40 cm      LV e' lateral:   7.30 cm/s LVOT diam:     2.20 cm      LV E/e' lateral: 13.4 LV SV:         77 LV SV Index:   38           2D Longitudinal Strain LVOT Area:     3.80 cm     2D Strain GLS Avg:     -18.0 %  LV Volumes (MOD) LV vol d, MOD A2C: 113.0 ml LV vol d, MOD A4C: 79.3 ml LV vol s, MOD A2C: 41.1 ml LV vol s, MOD A4C: 34.7 ml LV SV MOD A2C:     71.9 ml LV SV MOD A4C:     79.3 ml LV SV MOD BP:      57.1 ml RIGHT VENTRICLE             IVC RV S prime:     9.41 cm/s  IVC diam: 1.90 cm TAPSE (M-mode): 1.6 cm LEFT ATRIUM  Index        RIGHT ATRIUM           Index LA diam:        4.90 cm 2.45 cm/m   RA Area:     16.80 cm LA Vol (A2C):   44.2 ml 22.11 ml/m  RA Volume:   44.30 ml  22.16 ml/m LA Vol (A4C):   55.8 ml 27.91 ml/m LA Biplane Vol: 54.3 ml 27.16 ml/m  AORTIC VALVE LVOT Vmax:   118.00 cm/s LVOT Vmean:  71.300 cm/s LVOT VTI:    0.202 m  AORTA Ao Root diam: 2.90 cm Ao Asc diam:  3.00 cm MITRAL VALVE               TRICUSPID VALVE MV Area (PHT): 4.21 cm    TR Peak grad:   54.8 mmHg MV Decel Time: 180 msec    TR Vmax:        370.00 cm/s MV E velocity: 98.00 cm/s MV A velocity: 44.50 cm/s  SHUNTS MV E/A ratio:  2.20        Systemic VTI:  0.20 m                            Systemic Diam: 2.20 cm Gwyndolyn Kaufman MD Electronically signed by Gwyndolyn Kaufman MD Signature Date/Time: 03/24/2022/3:22:14 PM    Final    VAS [place] LOWER EXTREMITY VENOUS (DVT)  Result Date: 03/25/2022  Lower Venous DVT Study Patient Name:  TYR FRANCA  Date of Exam:   03/25/2022 Medical Rec #: [id number]        Accession #:    [id number] Date of Birth: August 11, 1976         Patient Gender: M Patient Age:   16 years Exam Location:  [facility] Procedure:      VAS [place] LOWER EXTREMITY VENOUS (DVT) Referring Phys: Fuller Plan --------------------------------------------------------------------------------  Indications: Swelling bilateral lower extremities.  Risk Factors: Occupation requires prolonged sitting. Comparison Study: No prior studies. Performing Technologist: Darlin Coco RDMS, RVT  Examination Guidelines: A complete evaluation includes B-mode imaging, spectral Doppler, color Doppler, and power Doppler as needed of all accessible portions of each vessel. Bilateral testing is considered an integral part of a complete examination. Limited examinations for reoccurring indications may be performed as noted. The reflux portion of the exam is  performed with the patient in reverse Trendelenburg.  +---------+---------------+---------+-----------+----------+--------------+ RIGHT    CompressibilityPhasicitySpontaneityPropertiesThrombus Aging +---------+---------------+---------+-----------+----------+--------------+ CFV      Full           Yes      Yes                                 +---------+---------------+---------+-----------+----------+--------------+ SFJ      Full                                                        +---------+---------------+---------+-----------+----------+--------------+ FV Prox  Full                                                        +---------+---------------+---------+-----------+----------+--------------+  FV Mid   Full                                                        +---------+---------------+---------+-----------+----------+--------------+ FV DistalFull                                                        +---------+---------------+---------+-----------+----------+--------------+ PFV      Full                                                        +---------+---------------+---------+-----------+----------+--------------+ POP      Full           Yes      Yes                                 +---------+---------------+---------+-----------+----------+--------------+ PTV      Full                                                        +---------+---------------+---------+-----------+----------+--------------+ PERO     Full                                                        +---------+---------------+---------+-----------+----------+--------------+ Gastroc  Full                                                        +---------+---------------+---------+-----------+----------+--------------+   +---------+---------------+---------+-----------+----------+--------------+ LEFT     CompressibilityPhasicitySpontaneityPropertiesThrombus  Aging +---------+---------------+---------+-----------+----------+--------------+ CFV      Full           Yes      Yes                                 +---------+---------------+---------+-----------+----------+--------------+ SFJ      Full                                                        +---------+---------------+---------+-----------+----------+--------------+ FV Prox  Full                                                        +---------+---------------+---------+-----------+----------+--------------+  FV Mid   Full                                                        +---------+---------------+---------+-----------+----------+--------------+ FV DistalFull                                                        +---------+---------------+---------+-----------+----------+--------------+ PFV      Full                                                        +---------+---------------+---------+-----------+----------+--------------+ POP      Full           Yes      Yes                                 +---------+---------------+---------+-----------+----------+--------------+ PTV      Full                                                        +---------+---------------+---------+-----------+----------+--------------+ PERO     Full                                                        +---------+---------------+---------+-----------+----------+--------------+ Gastroc  Full                                                        +---------+---------------+---------+-----------+----------+--------------+     Summary: RIGHT: - There is no evidence of deep vein thrombosis in the lower extremity.  - No cystic structure found in the popliteal fossa.  LEFT: - There is no evidence of deep vein thrombosis in the lower extremity.  - No cystic structure found in the popliteal fossa.  *See table(s) above for measurements and observations. Electronically  signed by Servando Snare MD on 03/25/2022 at 5:27:10 PM.    Final    VAS [place] UPPER EXT VEIN MAPPING (PRE-OP AVF)  Result Date: 03/25/2022 UPPER EXTREMITY VEIN MAPPING Patient Name:  JAVION HOLMER  Date of Exam:   03/25/2022 Medical Rec #: [id number]        Accession #:    [id number] Date of Birth: Jul 20, 1976         Patient Gender: M Patient Age:   25 years Exam Location:  [facility] Procedure:      VAS [place] UPPER EXT VEIN MAPPING (PRE-OP AVF) Referring Phys: Otelia Santee --------------------------------------------------------------------------------  Indications: Pre-access. History: CKD.  Comparison Study: No prior studies. Performing Technologist: Darlin Coco RDMS, RVT  Examination Guidelines: A complete evaluation includes B-mode imaging, spectral Doppler, color Doppler, and power Doppler as needed of all accessible portions of each vessel. Bilateral testing is considered an integral part of a complete examination. Limited examinations for reoccurring indications may be performed as noted. +-----------------+-------------+----------+-----------+ Right Cephalic   Diameter (cm)Depth (cm) Findings   +-----------------+-------------+----------+-----------+ Shoulder             0.32        0.33               +-----------------+-------------+----------+-----------+ Prox upper arm       0.31        0.25               +-----------------+-------------+----------+-----------+ Mid upper arm        0.25        0.20               +-----------------+-------------+----------+-----------+ Dist upper arm       0.28        0.22               +-----------------+-------------+----------+-----------+ Antecubital fossa    0.42        0.28   IV in place +-----------------+-------------+----------+-----------+ Prox forearm         0.27        0.18               +-----------------+-------------+----------+-----------+ Mid forearm          0.20        0.20                +-----------------+-------------+----------+-----------+ Dist forearm         0.21        0.20               +-----------------+-------------+----------+-----------+ Wrist                0.18        0.22               +-----------------+-------------+----------+-----------+ +-----------------+-------------+----------+---------+ Left Cephalic    Diameter (cm)Depth (cm)Findings  +-----------------+-------------+----------+---------+ Shoulder             0.23        0.26             +-----------------+-------------+----------+---------+ Prox upper arm       0.28        0.23             +-----------------+-------------+----------+---------+ Mid upper arm        0.24        0.23             +-----------------+-------------+----------+---------+ Dist upper arm       0.26        0.26             +-----------------+-------------+----------+---------+ Antecubital fossa    0.36        0.36             +-----------------+-------------+----------+---------+ Prox forearm         0.27        0.36   branching +-----------------+-------------+----------+---------+ Mid forearm          0.25        0.21             +-----------------+-------------+----------+---------+ Dist forearm  0.22        0.35   branching +-----------------+-------------+----------+---------+ Wrist                0.20        0.24             +-----------------+-------------+----------+---------+ +-----------------+-------------+----------+---------+ Left Basilic     Diameter (cm)Depth (cm)Findings  +-----------------+-------------+----------+---------+ Prox upper arm       0.42                         +-----------------+-------------+----------+---------+ Mid upper arm        0.32               branching +-----------------+-------------+----------+---------+ Dist upper arm       0.20                         +-----------------+-------------+----------+---------+ Antecubital  fossa    0.19                         +-----------------+-------------+----------+---------+ Prox forearm         0.23                         +-----------------+-------------+----------+---------+ Mid forearm          0.14                         +-----------------+-------------+----------+---------+ Distal forearm       0.15                         +-----------------+-------------+----------+---------+ Wrist                0.12                         +-----------------+-------------+----------+---------+ *See table(s) above for measurements and observations.  Diagnosing physician: Servando Snare MD Electronically signed by Servando Snare MD on 03/25/2022 at 5:27:02 PM.    Final    HYBRID OR IMAGING ([place])  Result Date: 03/26/2022 There is no interpretation for this exam.  This order is for images obtained during a surgical procedure.  Please See "Surgeries" Tab for more information regarding the procedure.      Today   Subjective    Levi Hodges today has no headache,no chest abdominal pain,no new weakness tingling or numbness, feels much better wants to go home today.   Objective   Blood pressure (!) 148/72, pulse 98, temperature 98.3 F (36.8 C), temperature source Oral, resp. rate 18, height '5\' 9"'  (1.753 m), weight 71.8 kg, SpO2 96 %.   Intake/Output Summary (Last 24 hours) at 03/27/2022 1603 Last data filed at 03/27/2022 1524 Gross per 24 hour  Intake 0 ml  Output 3000 ml  Net -3000 ml    Exam  Awake Alert, No new F.N deficits,   R.IJ cathter in place, site clean [place].AT,PERRAL Supple Neck,   Symmetrical Chest wall movement, Good air movement bilaterally, CTAB RRR,No Gallops,   +ve B.Sounds, Abd Soft, Non tender,  No Cyanosis, Clubbing or edema    Data Review   Recent Labs  Lab 03/24/22 0231 03/24/22 2019 03/25/22 0120 03/27/22 0646  WBC 7.0  --  5.7 9.4  HGB 7.9* 8.0* 7.4* 7.8*  HCT 25.8* 24.4* 23.8* 24.8*  PLT 382  --  345 333  MCV 87.2   --  85.6 84.9  MCH 26.7  --  26.6 26.7  MCHC 30.6  --  31.1 31.5  RDW 16.2*  --  16.2* 15.8*  LYMPHSABS 1.3  --   --   --   MONOABS 0.8  --   --   --   EOSABS 0.6*  --   --   --   BASOSABS 0.1  --   --   --     Recent Labs  Lab 03/24/22 0231 03/24/22 0610 03/24/22 0921 03/24/22 1400 03/24/22 2019 03/25/22 0120 03/26/22 0159 03/26/22 1109 03/27/22 0356  NA 141  --   --   --   --  139 139  --  134*  K 4.4  --   --   --   --  4.0 4.3  --  4.2  CL 114*  --   --   --   --  111 112*  --  102  CO2 18*  --   --   --   --  19* 17*  --  22  GLUCOSE 104*  --   --   --   --  167* 88  --  262*  BUN 67*  --   --   --   --  68* 67*  --  48*  CREATININE 8.77*  --   --   --   --  8.67* 8.64*  --  6.73*  CALCIUM 7.7*  --   --   --   --  7.5* 7.7*  --  7.7*  AST 50*  --   --   --   --   --   --   --   --   ALT 47*  --   --   --   --   --   --   --   --   ALKPHOS 162*  --   --   --   --   --   --   --   --   BILITOT 0.8  --   --   --   --   --   --   --   --   ALBUMIN 3.3*  --   --   --   --  2.9* 2.7*  --  2.7*  PHOS  --  5.7*  --   --   --  5.6* 5.5*  --  5.7*  DDIMER  --   --   --   --  1.42*  --   --   --   --   INR  --   --  1.1  --   --   --   --   --   --   TSH  --   --   --   --   --   --   --  2.034  --   HGBA1C  --   --   --  5.4  --   --   --   --   --   BNP 1,666.4*  --   --   --   --   --   --   --   --     Total Time in preparing paper work, data evaluation and todays exam - 35 minutes  Lala Lund M.D on 03/27/2022 at 4:03 PM  [facility]

## 2022-03-27 NOTE — Progress Notes (Signed)
Pt refused to have a CHG bath. He told [name] that she didn't know what she was doing and if the drsg got wet he would die.

## 2022-03-27 NOTE — Progress Notes (Signed)
PROGRESS NOTE                                                                                                                                                                                                             Patient Demographics:    Levi Hodges, is a 46 y.o. male, DOB - May 27, 1976, AJO:[id number]  Outpatient Primary MD for the patient is Hoyt Koch, MD    LOS - 3  Admit date - 03/24/2022    Chief Complaint  Patient presents with   SOB / Legs Swelling    Hypertensive       Brief Narrative (HPI from H&P)   a 46 y.o. male with medical history significant of hypertension, diabetes mellitus type 2,  chronic kidney disease stage IV, and tobacco abuse who presents with complaints of progressively worsening swelling of his legs and shortness of breath over the last month, he was diagnosed with CKD 5 now progressing to ESRD along with fluid overload, hypertensive urgency and admitted to the hospital   Subjective:   Patient in bed, appears comfortable, denies any headache, no fever, no chest pain or pressure, no shortness of breath , no abdominal pain. No new focal weakness.   Assessment  & Plan :    CKD 5 now progressing to ESRD.  Due to medication noncompliance caused by lack of insurance, nephrology on board he is going to get temporary dialysis catheter placed by vascular surgery and dialysis was started on 03/26/2022, further management per nephrology.  2.  Hypertensive emergency.  Due to lack of medications caused by lack of insurance, placed on appropriate medications and doses adjusted further on 03/25/2022.  Continue to monitor and adjust gradually.  3.  Anemia of chronic disease.  Monitor no signs of ongoing acute blood loss.  4.  Acute on chronic diastolic CHF due to fluid overload built by renal dysfunction.  Fluid removal through HD now, received IV Lasix on 03/24/2022.  Currently much improved  clinically.  5.  Hypertensive emergency causing mild troponin rise, troponin rise in non-ACS pattern and trend is flat, EKG nonacute, with controlling of blood pressure chest pain resolved.  6.  Metabolic acidosis.  Kindly see #1 above.  7.  Smoking.  Counseled to quit.  8.  7 mm right lower lobe lung nodule on chest x-ray.  CT  chest does not show a nodule but did show mediastinal lymphadenopathy of nonspecific etiology, outpatient follow-up with pulmonary or PCP.  9.  DM type II.  Diet controlled, monitor.  Lab Results  Component Value Date   HGBA1C 5.4 03/24/2022        Condition -   Guarded  Family Communication  : Wife bedside on 03/26/2022  Code Status :  Full  Consults  :  VVS, Renal  PUD Prophylaxis :    Procedures  :     TTE - 1. Left ventricular ejection fraction, by estimation, is 55 to 60%. The left ventricle has normal function. The left ventricle has no regional wall motion abnormalities. There is severe left ventricular hypertrophy. Left ventricular diastolic parameters  are consistent with Grade II diastolic dysfunction (pseudonormalization). Elevated left atrial pressure. The average left ventricular global longitudinal strain is -18.0 % which is low normal. Pattern does not appear to be consistent with amyloid, however, could consider CMR in the future given severe LVH and speckled pattern on echo.  2. Right ventricular systolic function is mildly reduced. The right ventricular size is normal. There is severely elevated pulmonary artery systolic pressure. The estimated right ventricular systolic pressure is 74.1 mmHg.  3. A small pericardial effusion is present. The pericardial effusion is posterior to the left ventricle.  4. The mitral valve is normal in structure. Trivial mitral valve regurgitation.  5. Tricuspid valve regurgitation is mild to moderate.  6. The aortic valve is tricuspid. There is mild thickening of the aortic valve. Aortic valve regurgitation is not  visualized. Aortic valve sclerosis is present, with no evidence of aortic valve stenosis.  7. The inferior vena cava is dilated in size with <50% respiratory variability, suggesting right atrial pressure of 15 mmHg.  CT chest -  1. No CT findings to correlate with nodular density seen in the lower right lung on prior chest x-ray, favored to be artifactual and due to vascular shadow. 2. Small bilateral pleural effusions, mild pulmonary edema, and atelectasis. 3. Mild cardiomegaly. 4. Enlarged mediastinal lymph nodes, likely reactive. Recommend follow-up chest CT in 3 months to ensure stability  R.IJ HD catheter placement on 03/25/2022        Disposition Plan  :    Status is: Inpatient  DVT Prophylaxis  :  Heparin  heparin injection 5,000 Units Start: 03/25/22 1400 SCDs Start: 03/24/22 0852 SCDs Start: 03/24/22 0630    Lab Results  Component Value Date   PLT 333 03/27/2022    Diet :  Diet Order             Diet renal/carb modified with fluid restriction Diet-HS Snack? Nothing; Fluid restriction: 1200 mL Fluid; Room service appropriate? Yes; Fluid consistency: Thin  Diet effective now                    Inpatient Medications  Scheduled Meds:  amLODipine  10 mg Oral Daily   carvedilol  6.25 mg Oral BID WC   Chlorhexidine Gluconate Cloth  6 each Topical Q0600   cloNIDine  0.1 mg Oral TID   heparin injection (subcutaneous)  5,000 Units Subcutaneous Q8H   nicotine  21 mg Transdermal Daily   sevelamer carbonate  800 mg Oral TID WC   sodium chloride flush  3 mL Intravenous Q12H   Continuous Infusions:  sodium chloride     sodium chloride     ferric gluconate (FERRLECIT) IVPB     ferric gluconate (FERRLECIT) IVPB 250  mg (03/27/22 1005)   PRN Meds:.sodium chloride, sodium chloride, acetaminophen **OR** acetaminophen, alteplase, heparin, hydrALAZINE, HYDROmorphone (DILAUDID) injection, labetalol, lidocaine (PF), lidocaine-prilocaine, ondansetron **OR** ondansetron (ZOFRAN)  IV, pentafluoroprop-tetrafluoroeth  Antibiotics  :    Anti-infectives (From admission, onward)    Start     Dose/Rate Route Frequency Ordered Stop   03/26/22 1155  ceFAZolin (ANCEF) 2-4 GM/100ML-% IVPB       Note to Pharmacy: Gregery Na H: cabinet override      03/26/22 1155 03/26/22 2359        Time Spent in minutes  30   Lala Lund M.D on 03/27/2022 at 10:31 AM  To page go to [email or web address]   [facility] -  Office  [phone]  See all Orders from today for further details    Objective:   Vitals:   03/27/22 0826 03/27/22 0900 03/27/22 0930 03/27/22 1000  BP: (!) 178/100 (!) 164/91 (!) 161/90 (!) 167/90  Pulse: 72 68 68 67  Resp: 16     Temp: 98.5 F (36.9 C)     TempSrc:      SpO2:      Weight: 73.9 kg     Height:        Wt Readings from Last 3 Encounters:  03/27/22 73.9 kg  03/02/21 77.2 kg  09/21/19 85.3 kg     Intake/Output Summary (Last 24 hours) at 03/27/2022 1031 Last data filed at 03/26/2022 2000 Gross per 24 hour  Intake 400 ml  Output 1005 ml  Net -605 ml     Physical Exam  Awake Alert, No new F.N deficits, agitated affect, R IJ HD Cath [place].AT,PERRAL Supple Neck, No JVD,   Symmetrical Chest wall movement, Good air movement bilaterally, CTAB RRR,No Gallops, Rubs or new Murmurs,  +ve B.Sounds, Abd Soft, No tenderness,   No Cyanosis, Clubbing or edema      Data Review:    CBC Recent Labs  Lab 03/24/22 0231 03/24/22 2019 03/25/22 0120 03/27/22 0646  WBC 7.0  --  5.7 9.4  HGB 7.9* 8.0* 7.4* 7.8*  HCT 25.8* 24.4* 23.8* 24.8*  PLT 382  --  345 333  MCV 87.2  --  85.6 84.9  MCH 26.7  --  26.6 26.7  MCHC 30.6  --  31.1 31.5  RDW 16.2*  --  16.2* 15.8*  LYMPHSABS 1.3  --   --   --   MONOABS 0.8  --   --   --   EOSABS 0.6*  --   --   --   BASOSABS 0.1  --   --   --     Electrolytes Recent Labs  Lab 03/24/22 0231 03/24/22 0921 03/24/22 1400 03/24/22 2019 03/25/22 0120 03/26/22 0159 03/26/22 1109  03/27/22 0356  NA 141  --   --   --  139 139  --  134*  K 4.4  --   --   --  4.0 4.3  --  4.2  CL 114*  --   --   --  111 112*  --  102  CO2 18*  --   --   --  19* 17*  --  22  GLUCOSE 104*  --   --   --  167* 88  --  262*  BUN 67*  --   --   --  68* 67*  --  48*  CREATININE 8.77*  --   --   --  8.67* 8.64*  --  6.73*  CALCIUM 7.7*  --   --   --  7.5* 7.7*  --  7.7*  AST 50*  --   --   --   --   --   --   --   ALT 47*  --   --   --   --   --   --   --   ALKPHOS 162*  --   --   --   --   --   --   --   BILITOT 0.8  --   --   --   --   --   --   --   ALBUMIN 3.3*  --   --   --  2.9* 2.7*  --  2.7*  DDIMER  --   --   --  1.42*  --   --   --   --   INR  --  1.1  --   --   --   --   --   --   TSH  --   --   --   --   --   --  2.034  --   HGBA1C  --   --  5.4  --   --   --   --   --   BNP 1,666.4*  --   --   --   --   --   --   --        Micro Results No results found for this or any previous visit (from the past 240 hour(s)).  Radiology Reports DG Chest 2 View  Result Date: 03/24/2022 CLINICAL DATA:  Shortness of breath. EXAM: CHEST - 2 VIEW COMPARISON:  Chest radiograph dated 03/01/2021. FINDINGS: Mild diffuse chronic interstitial coarsening. No focal consolidation, pleural effusion, pneumothorax. Faint 7 mm nodular density is in the right lower lung field may be artifactual or related to nipple shadow. A pulmonary nodule is not excluded. Attention on follow-up imaging recommended. Top-normal cardiac silhouette. No acute osseous pathology. IMPRESSION: 1. No acute cardiopulmonary process. 2. Faint 7 mm nodular density in the right lower lung field. Attention on follow-up imaging recommended. Electronically Signed   By: Anner Crete M.D.   On: 03/24/2022 03:06   CT CHEST WO CONTRAST  Result Date: 03/25/2022 CLINICAL DATA:  Lung nodule EXAM: CT CHEST WITHOUT CONTRAST TECHNIQUE: Multidetector CT imaging of the chest was performed following the standard protocol without IV contrast.  RADIATION DOSE REDUCTION: This exam was performed according to the departmental dose-optimization program which includes automated exposure control, adjustment of the mA and/or kV according to patient size and/or use of iterative reconstruction technique. COMPARISON:  Chest x-ray dated Mar 24, 2022 FINDINGS: Cardiovascular: Mild cardiomegaly. No pericardial effusion. No significant atherosclerotic disease of the thoracic aorta. No significant coronary artery calcifications. Mediastinum/Nodes: Thyroid is unremarkable. Enlarged mediastinal lymph nodes. Reference right lower paratracheal lymph node measuring 1.6 cm in short axis on series 3, image 25. Lungs/Pleura: Central airways are patent. Smooth bilateral interlobular septal thickening. Small bilateral pleural effusions. Bibasilar atelectasis. Upper Abdomen: No acute abnormality. Musculoskeletal: No chest wall mass or suspicious bone lesions identified. IMPRESSION: 1. No CT findings to correlate with nodular density seen in the lower right lung on prior chest x-ray, favored to be artifactual and due to vascular shadow. 2. Small bilateral pleural effusions, mild pulmonary edema, and atelectasis. 3. Mild cardiomegaly. 4. Enlarged mediastinal lymph nodes, likely reactive. Recommend follow-up chest CT in 3 months to  ensure stability. Electronically Signed   By: Yetta Glassman M.D.   On: 03/25/2022 10:09   DG Chest Port 1 View  Result Date: 03/26/2022 CLINICAL DATA:  Central line placement. EXAM: PORTABLE CHEST 1 VIEW COMPARISON:  Mar 24, 2021. FINDINGS: Stable cardiomegaly. Right internal jugular catheter is noted with distal tip in expected position of the SVC. No pneumothorax is noted. Both lungs are clear. The visualized skeletal structures are unremarkable. IMPRESSION: Right internal jugular catheter with distal tip in expected position of the SVC. No pneumothorax is noted. Electronically Signed   By: Marijo Conception M.D.   On: 03/26/2022 16:22    ECHOCARDIOGRAM COMPLETE  Result Date: 03/24/2022    ECHOCARDIOGRAM REPORT   Patient Name:   Levi Hodges Date of Exam: 03/24/2022 Medical Rec #:  [id number]       Height:       69.0 in Accession #:    [id number]      Weight:       185.2 lb Date of Birth:  Mar 24, 1976        BSA:          2.000 m Patient Age:    65 years        BP:           193/114 mmHg Patient Gender: M               HR:           91 bpm. Exam Location:  Inpatient Procedure: 2D Echo, 3D Echo, Cardiac Doppler, Color Doppler and Strain Analysis Indications:    Elevated troponin. ; R60.0 Lower extremity edema  History:        Patient has no prior history of Echocardiogram examinations.                 Risk Factors:Current Smoker, Diabetes, Hypertension and                 Dyslipidemia. CKD.  Sonographer:    Roseanna Rainbow RDCS Referring Phys: [id number] RONDELL A SMITH  Sonographer Comments: Global longitudinal strain was attempted. Patient in fowler's position. Consult with nephrologist during echo. IMPRESSIONS  1. Left ventricular ejection fraction, by estimation, is 55 to 60%. The left ventricle has normal function. The left ventricle has no regional wall motion abnormalities. There is severe left ventricular hypertrophy. Left ventricular diastolic parameters  are consistent with Grade II diastolic dysfunction (pseudonormalization). Elevated left atrial pressure. The average left ventricular global longitudinal strain is -18.0 % which is low normal. Pattern does not appear to be consistent with amyloid, however, could consider CMR in the future given severe LVH and speckled pattern on echo.  2. Right ventricular systolic function is mildly reduced. The right ventricular size is normal. There is severely elevated pulmonary artery systolic pressure. The estimated right ventricular systolic pressure is 38.4 mmHg.  3. A small pericardial effusion is present. The pericardial effusion is posterior to the left ventricle.  4. The mitral valve is normal in  structure. Trivial mitral valve regurgitation.  5. Tricuspid valve regurgitation is mild to moderate.  6. The aortic valve is tricuspid. There is mild thickening of the aortic valve. Aortic valve regurgitation is not visualized. Aortic valve sclerosis is present, with no evidence of aortic valve stenosis.  7. The inferior vena cava is dilated in size with <50% respiratory variability, suggesting right atrial pressure of 15 mmHg. Comparison(s): No prior Echocardiogram. FINDINGS  Left Ventricle: Left ventricular ejection fraction, by estimation, is  55 to 60%. The left ventricle has normal function. The left ventricle has no regional wall motion abnormalities. The average left ventricular global longitudinal strain is -18.0 %. The left ventricular internal cavity size was normal in size. There is severe left ventricular hypertrophy. Left ventricular diastolic parameters are consistent with Grade II diastolic dysfunction (pseudonormalization). Elevated left atrial pressure. Right Ventricle: The right ventricular size is normal. No increase in right ventricular wall thickness. Right ventricular systolic function is mildly reduced. There is severely elevated pulmonary artery systolic pressure. The tricuspid regurgitant velocity is 3.70 m/s, and with an assumed right atrial pressure of 15 mmHg, the estimated right ventricular systolic pressure is 58.0 mmHg. Left Atrium: Left atrial size was normal in size. Right Atrium: Right atrial size was normal in size. Pericardium: A small pericardial effusion is present. The pericardial effusion is posterior to the left ventricle. Mitral Valve: The mitral valve is normal in structure. Trivial mitral valve regurgitation. Tricuspid Valve: The tricuspid valve is normal in structure. Tricuspid valve regurgitation is mild to moderate. Aortic Valve: The aortic valve is tricuspid. There is mild thickening of the aortic valve. Aortic valve regurgitation is not visualized. Aortic valve  sclerosis is present, with no evidence of aortic valve stenosis. Pulmonic Valve: The pulmonic valve was normal in structure. Pulmonic valve regurgitation is trivial. Aorta: The aortic root and ascending aorta are structurally normal, with no evidence of dilitation. Venous: The inferior vena cava is dilated in size with less than 50% respiratory variability, suggesting right atrial pressure of 15 mmHg. IAS/Shunts: The atrial septum is grossly normal.  LEFT VENTRICLE PLAX 2D LVIDd:         4.40 cm      Diastology LVIDs:         3.10 cm      LV e' medial:    4.90 cm/s LV PW:         1.80 cm      LV E/e' medial:  20.0 LV IVS:        1.40 cm      LV e' lateral:   7.30 cm/s LVOT diam:     2.20 cm      LV E/e' lateral: 13.4 LV SV:         77 LV SV Index:   38           2D Longitudinal Strain LVOT Area:     3.80 cm     2D Strain GLS Avg:     -18.0 %  LV Volumes (MOD) LV vol d, MOD A2C: 113.0 ml LV vol d, MOD A4C: 79.3 ml LV vol s, MOD A2C: 41.1 ml LV vol s, MOD A4C: 34.7 ml LV SV MOD A2C:     71.9 ml LV SV MOD A4C:     79.3 ml LV SV MOD BP:      57.1 ml RIGHT VENTRICLE            IVC RV S prime:     9.41 cm/s  IVC diam: 1.90 cm TAPSE (M-mode): 1.6 cm LEFT ATRIUM             Index        RIGHT ATRIUM           Index LA diam:        4.90 cm 2.45 cm/m   RA Area:     16.80 cm LA Vol (A2C):   44.2 ml 22.11 ml/m  RA Volume:   44.30 ml  22.16 ml/m LA Vol (A4C):   55.8 ml 27.91 ml/m LA Biplane Vol: 54.3 ml 27.16 ml/m  AORTIC VALVE LVOT Vmax:   118.00 cm/s LVOT Vmean:  71.300 cm/s LVOT VTI:    0.202 m  AORTA Ao Root diam: 2.90 cm Ao Asc diam:  3.00 cm MITRAL VALVE               TRICUSPID VALVE MV Area (PHT): 4.21 cm    TR Peak grad:   54.8 mmHg MV Decel Time: 180 msec    TR Vmax:        370.00 cm/s MV E velocity: 98.00 cm/s MV A velocity: 44.50 cm/s  SHUNTS MV E/A ratio:  2.20        Systemic VTI:  0.20 m                            Systemic Diam: 2.20 cm Gwyndolyn Kaufman MD Electronically signed by Gwyndolyn Kaufman MD  Signature Date/Time: 03/24/2022/3:22:14 PM    Final    VAS [place] LOWER EXTREMITY VENOUS (DVT)  Result Date: 03/25/2022  Lower Venous DVT Study Patient Name:  ARLING CERONE  Date of Exam:   03/25/2022 Medical Rec #: [id number]        Accession #:    [id number] Date of Birth: Mar 24, 1976         Patient Gender: M Patient Age:   45 years Exam Location:  [facility] Procedure:      VAS [place] LOWER EXTREMITY VENOUS (DVT) Referring Phys: Fuller Plan --------------------------------------------------------------------------------  Indications: Swelling bilateral lower extremities.  Risk Factors: Occupation requires prolonged sitting. Comparison Study: No prior studies. Performing Technologist: Darlin Coco RDMS, RVT  Examination Guidelines: A complete evaluation includes B-mode imaging, spectral Doppler, color Doppler, and power Doppler as needed of all accessible portions of each vessel. Bilateral testing is considered an integral part of a complete examination. Limited examinations for reoccurring indications may be performed as noted. The reflux portion of the exam is performed with the patient in reverse Trendelenburg.  +---------+---------------+---------+-----------+----------+--------------+ RIGHT    CompressibilityPhasicitySpontaneityPropertiesThrombus Aging +---------+---------------+---------+-----------+----------+--------------+ CFV      Full           Yes      Yes                                 +---------+---------------+---------+-----------+----------+--------------+ SFJ      Full                                                        +---------+---------------+---------+-----------+----------+--------------+ FV Prox  Full                                                        +---------+---------------+---------+-----------+----------+--------------+ FV Mid   Full                                                         +---------+---------------+---------+-----------+----------+--------------+  FV DistalFull                                                        +---------+---------------+---------+-----------+----------+--------------+ PFV      Full                                                        +---------+---------------+---------+-----------+----------+--------------+ POP      Full           Yes      Yes                                 +---------+---------------+---------+-----------+----------+--------------+ PTV      Full                                                        +---------+---------------+---------+-----------+----------+--------------+ PERO     Full                                                        +---------+---------------+---------+-----------+----------+--------------+ Gastroc  Full                                                        +---------+---------------+---------+-----------+----------+--------------+   +---------+---------------+---------+-----------+----------+--------------+ LEFT     CompressibilityPhasicitySpontaneityPropertiesThrombus Aging +---------+---------------+---------+-----------+----------+--------------+ CFV      Full           Yes      Yes                                 +---------+---------------+---------+-----------+----------+--------------+ SFJ      Full                                                        +---------+---------------+---------+-----------+----------+--------------+ FV Prox  Full                                                        +---------+---------------+---------+-----------+----------+--------------+ FV Mid   Full                                                        +---------+---------------+---------+-----------+----------+--------------+  FV DistalFull                                                         +---------+---------------+---------+-----------+----------+--------------+ PFV      Full                                                        +---------+---------------+---------+-----------+----------+--------------+ POP      Full           Yes      Yes                                 +---------+---------------+---------+-----------+----------+--------------+ PTV      Full                                                        +---------+---------------+---------+-----------+----------+--------------+ PERO     Full                                                        +---------+---------------+---------+-----------+----------+--------------+ Gastroc  Full                                                        +---------+---------------+---------+-----------+----------+--------------+     Summary: RIGHT: - There is no evidence of deep vein thrombosis in the lower extremity.  - No cystic structure found in the popliteal fossa.  LEFT: - There is no evidence of deep vein thrombosis in the lower extremity.  - No cystic structure found in the popliteal fossa.  *See table(s) above for measurements and observations. Electronically signed by Servando Snare MD on 03/25/2022 at 5:27:10 PM.    Final    VAS [place] UPPER EXT VEIN MAPPING (PRE-OP AVF)  Result Date: 03/25/2022 UPPER EXTREMITY VEIN MAPPING Patient Name:  SINAI MAHANY  Date of Exam:   03/25/2022 Medical Rec #: [id number]        Accession #:    [id number] Date of Birth: [birth year]         Patient Gender: M Patient Age:   45 years Exam [facility]:  [facility] Procedure:      VAS [place] UPPER EXT VEIN MAPPING (PRE-OP AVF) Referring Phys: Otelia Santee --------------------------------------------------------------------------------  Indications: Pre-access. History: CKD.  Comparison Study: No prior studies. Performing Technologist: Darlin Coco RDMS, RVT  Examination Guidelines: A complete evaluation includes B-mode imaging, spectral  Doppler, color Doppler, and power Doppler as needed of all accessible portions of each vessel. Bilateral testing is considered an integral part of a complete examination. Limited examinations for reoccurring indications may be performed as noted. +-----------------+-------------+----------+-----------+ Right  Cephalic   Diameter (cm)Depth (cm) Findings   +-----------------+-------------+----------+-----------+ Shoulder             0.32        0.33               +-----------------+-------------+----------+-----------+ Prox upper arm       0.31        0.25               +-----------------+-------------+----------+-----------+ Mid upper arm        0.25        0.20               +-----------------+-------------+----------+-----------+ Dist upper arm       0.28        0.22               +-----------------+-------------+----------+-----------+ Antecubital fossa    0.42        0.28   IV in place +-----------------+-------------+----------+-----------+ Prox forearm         0.27        0.18               +-----------------+-------------+----------+-----------+ Mid forearm          0.20        0.20               +-----------------+-------------+----------+-----------+ Dist forearm         0.21        0.20               +-----------------+-------------+----------+-----------+ Wrist                0.18        0.22               +-----------------+-------------+----------+-----------+ +-----------------+-------------+----------+---------+ Left Cephalic    Diameter (cm)Depth (cm)Findings  +-----------------+-------------+----------+---------+ Shoulder             0.23        0.26             +-----------------+-------------+----------+---------+ Prox upper arm       0.28        0.23             +-----------------+-------------+----------+---------+ Mid upper arm        0.24        0.23             +-----------------+-------------+----------+---------+ Dist  upper arm       0.26        0.26             +-----------------+-------------+----------+---------+ Antecubital fossa    0.36        0.36             +-----------------+-------------+----------+---------+ Prox forearm         0.27        0.36   branching +-----------------+-------------+----------+---------+ Mid forearm          0.25        0.21             +-----------------+-------------+----------+---------+ Dist forearm         0.22        0.35   branching +-----------------+-------------+----------+---------+ Wrist                0.20        0.24             +-----------------+-------------+----------+---------+ +-----------------+-------------+----------+---------+ Left Basilic  Diameter (cm)Depth (cm)Findings  +-----------------+-------------+----------+---------+ Prox upper arm       0.42                         +-----------------+-------------+----------+---------+ Mid upper arm        0.32               branching +-----------------+-------------+----------+---------+ Dist upper arm       0.20                         +-----------------+-------------+----------+---------+ Antecubital fossa    0.19                         +-----------------+-------------+----------+---------+ Prox forearm         0.23                         +-----------------+-------------+----------+---------+ Mid forearm          0.14                         +-----------------+-------------+----------+---------+ Distal forearm       0.15                         +-----------------+-------------+----------+---------+ Wrist                0.12                         +-----------------+-------------+----------+---------+ *See table(s) above for measurements and observations.  Diagnosing physician: Servando Snare MD Electronically signed by Servando Snare MD on 03/25/2022 at 5:27:02 PM.    Final    HYBRID OR IMAGING ([place])  Result Date: 03/26/2022 There is no  interpretation for this exam.  This order is for images obtained during a surgical procedure.  Please See "Surgeries" Tab for more information regarding the procedure.

## 2022-03-27 NOTE — Progress Notes (Signed)
[facility] Progress Note   46 y.o. male with a history  of uncontrolled DM and HTN who presents for evaluation of CKD.  He also has a history of not taking his medications regularly due to cost and has been out of medications for well over a year.  He has had DM for about 13 years.  He has been on insulin around 8 years and also has h/o not taking insulin regularly due to cost.  He is presenting with hypertensive crisis and acute renal failure with a similar hospitalization in 02/2021. He is uninsured after losing his job and unable to afford any of his antihypertensive medications over the past 13 mths. Creatinine noted to be 8.77 up from 3.1-3.6 April of 2022. Appears to have an episode of acute on chronic kidney disease in April 2022 with improvement from 3.64 -> 3.12 over a course of days when he presented with hypertensive crisis.   In the ED CXR shows vascular congestion and he was given Lasix '20mg'$   in the ED. Systolic BP noted to be in the 230's treated with IV Labetalol pushes and IV diuresis.    Assessment/ Plan:   Acute on CKD IV - Kidney biopsy in 05/2019 c/w diabetic nephropathy with nodular glomerulosclerosis with moderate interstitial fibrosis and  tubular atrophy with no e/o immune complex disease. He almost certainly has had progression of kidney disease and question now is how advanced is the chronic component now.  - Will try to get his bp under better control as well as restart on anti-hypertensive regimen and see where his creatinine settles in at. But with the the h/o  already moderate interstitial fibrosis + tubular atrophy in 2020 + no medical treatment for over a year + anemia + anorexia for months and now nausea over past week all point to his renal disease progressing to ESRD. Cr unchanged this AM c/w advancing renal disease.   - Appreciate Dr. Stanford Breed  VVS placing lt BCF + RIJ TC on 5/24 -> I did not hear a bruit on auscultation this am. Can place next outpt as  outpt as he's in a hurry to get out of the hospital.  Tolerated HD #1 5/24 finishing close to [place] and will be getting #2 today. They are calling for him last to space him more from hd #1 for 1st shift. May not be running a 2nd shift today.  Still in CLIP process.  -Monitor Daily I/Os, Daily weight  -Avoid nephrotoxic medications including NSAIDs, contrast if at all possible. -PVR to ensure no obstructive uropathy; wrote order again today (I don't see anything in the chart flowsheet) but low suspicion for obstructive uropathy.   Hypertensive emergency  - On Amlodipine '10mg'$  daily, Metoprolol + diuretics; can start ACEI if needed once he's once dialysis. - Goal BP 30% decrease in 1st 24hrs to maintain adequate vital organ perfusion; oral medications are working and no need for IV gtt. Renal osteodystrophy - phos 5.6 -> started Renvela 1 TIDM and will recheck phos level later in the week. Anemia - Will dose IV iron again today #2 of Ferrlecit '250mg'$   and give ESA as well. DM - A1c previously was 13 in 2020. Per primary. HLD  Subjective:   Denies fever/ chills/ nausea/ myalgias/ dysuria. Tolerated HD last night.   Objective:   BP 129/79 (BP Location: Right Arm)   Pulse 71   Temp 98.3 F (36.8 C) (Oral)   Resp 16   Ht '5\' 9"'$  (1.753  m)   Wt 74.1 kg   SpO2 100%   BMI 24.12 kg/m   Intake/Output Summary (Last 24 hours) at 03/27/2022 5573 Last data filed at 03/26/2022 2000 Gross per 24 hour  Intake 400 ml  Output 1005 ml  Net -605 ml   Weight change: -0.9 kg  Physical Exam: GEN: NAD, A&Ox3, NCAT HEENT: No conjunctival pallor, EOMI NECK: Supple, no thyromegaly LUNGS: CTA B/L no rales, rhonchi or wheezing CV: RRR, No M/R/G ABD: SNDNT +BS  EXT: tr-1+ lower extremity edema ACCESS: RIJ TC, no bruit in lt BCF   Imaging: CT CHEST WO CONTRAST  Result Date: 03/25/2022 CLINICAL DATA:  Lung nodule EXAM: CT CHEST WITHOUT CONTRAST TECHNIQUE: Multidetector CT imaging of the chest was  performed following the standard protocol without IV contrast. RADIATION DOSE REDUCTION: This exam was performed according to the departmental dose-optimization program which includes automated exposure control, adjustment of the mA and/or kV according to patient size and/or use of iterative reconstruction technique. COMPARISON:  Chest x-ray dated Mar 24, 2022 FINDINGS: Cardiovascular: Mild cardiomegaly. No pericardial effusion. No significant atherosclerotic disease of the thoracic aorta. No significant coronary artery calcifications. Mediastinum/Nodes: Thyroid is unremarkable. Enlarged mediastinal lymph nodes. Reference right lower paratracheal lymph node measuring 1.6 cm in short axis on series 3, image 25. Lungs/Pleura: Central airways are patent. Smooth bilateral interlobular septal thickening. Small bilateral pleural effusions. Bibasilar atelectasis. Upper Abdomen: No acute abnormality. Musculoskeletal: No chest wall mass or suspicious bone lesions identified. IMPRESSION: 1. No CT findings to correlate with nodular density seen in the lower right lung on prior chest x-ray, favored to be artifactual and due to vascular shadow. 2. Small bilateral pleural effusions, mild pulmonary edema, and atelectasis. 3. Mild cardiomegaly. 4. Enlarged mediastinal lymph nodes, likely reactive. Recommend follow-up chest CT in 3 months to ensure stability. Electronically Signed   By: Yetta Glassman M.D.   On: 03/25/2022 10:09   DG Chest Port 1 View  Result Date: 03/26/2022 CLINICAL DATA:  Central line placement. EXAM: PORTABLE CHEST 1 VIEW COMPARISON:  Mar 24, 2021. FINDINGS: Stable cardiomegaly. Right internal jugular catheter is noted with distal tip in expected position of the SVC. No pneumothorax is noted. Both lungs are clear. The visualized skeletal structures are unremarkable. IMPRESSION: Right internal jugular catheter with distal tip in expected position of the SVC. No pneumothorax is noted. Electronically Signed    By: Marijo Conception M.D.   On: 03/26/2022 16:22   VAS [place] LOWER EXTREMITY VENOUS (DVT)  Result Date: 03/25/2022  Lower Venous DVT Study Patient Name:  Levi Hodges  Date of Exam:   03/25/2022 Medical Rec #: [id number]        Accession #:    [id number] Date of Birth: 1976/02/07         Patient Gender: M Patient Age:   89 years Exam Location:  [facility] Procedure:      VAS [place] LOWER EXTREMITY VENOUS (DVT) Referring Phys: Fuller Plan --------------------------------------------------------------------------------  Indications: Swelling bilateral lower extremities.  Risk Factors: Occupation requires prolonged sitting. Comparison Study: No prior studies. Performing Technologist: Darlin Coco RDMS, RVT  Examination Guidelines: A complete evaluation includes B-mode imaging, spectral Doppler, color Doppler, and power Doppler as needed of all accessible portions of each vessel. Bilateral testing is considered an integral part of a complete examination. Limited examinations for reoccurring indications may be performed as noted. The reflux portion of the exam is performed with the patient in reverse Trendelenburg.  +---------+---------------+---------+-----------+----------+--------------+ RIGHT  CompressibilityPhasicitySpontaneityPropertiesThrombus Aging +---------+---------------+---------+-----------+----------+--------------+ CFV      Full           Yes      Yes                                 +---------+---------------+---------+-----------+----------+--------------+ SFJ      Full                                                        +---------+---------------+---------+-----------+----------+--------------+ FV Prox  Full                                                        +---------+---------------+---------+-----------+----------+--------------+ FV Mid   Full                                                         +---------+---------------+---------+-----------+----------+--------------+ FV DistalFull                                                        +---------+---------------+---------+-----------+----------+--------------+ PFV      Full                                                        +---------+---------------+---------+-----------+----------+--------------+ POP      Full           Yes      Yes                                 +---------+---------------+---------+-----------+----------+--------------+ PTV      Full                                                        +---------+---------------+---------+-----------+----------+--------------+ PERO     Full                                                        +---------+---------------+---------+-----------+----------+--------------+ Gastroc  Full                                                        +---------+---------------+---------+-----------+----------+--------------+   +---------+---------------+---------+-----------+----------+--------------+  LEFT     CompressibilityPhasicitySpontaneityPropertiesThrombus Aging +---------+---------------+---------+-----------+----------+--------------+ CFV      Full           Yes      Yes                                 +---------+---------------+---------+-----------+----------+--------------+ SFJ      Full                                                        +---------+---------------+---------+-----------+----------+--------------+ FV Prox  Full                                                        +---------+---------------+---------+-----------+----------+--------------+ FV Mid   Full                                                        +---------+---------------+---------+-----------+----------+--------------+ FV DistalFull                                                         +---------+---------------+---------+-----------+----------+--------------+ PFV      Full                                                        +---------+---------------+---------+-----------+----------+--------------+ POP      Full           Yes      Yes                                 +---------+---------------+---------+-----------+----------+--------------+ PTV      Full                                                        +---------+---------------+---------+-----------+----------+--------------+ PERO     Full                                                        +---------+---------------+---------+-----------+----------+--------------+ Gastroc  Full                                                        +---------+---------------+---------+-----------+----------+--------------+  Summary: RIGHT: - There is no evidence of deep vein thrombosis in the lower extremity.  - No cystic structure found in the popliteal fossa.  LEFT: - There is no evidence of deep vein thrombosis in the lower extremity.  - No cystic structure found in the popliteal fossa.  *See table(s) above for measurements and observations. Electronically signed by Servando Snare MD on 03/25/2022 at 5:27:10 PM.    Final    VAS [place] UPPER EXT VEIN MAPPING (PRE-OP AVF)  Result Date: 03/25/2022 UPPER EXTREMITY VEIN MAPPING Patient Name:  Levi Hodges  Date of Exam:   03/25/2022 Medical Rec #: [id number]        Accession #:    [id number] Date of Birth: Jun 10, 1976         Patient Gender: M Patient Age:   60 years Exam Location:  [facility] Procedure:      VAS [place] UPPER EXT VEIN MAPPING (PRE-OP AVF) Referring Phys: Otelia Santee --------------------------------------------------------------------------------  Indications: Pre-access. History: CKD.  Comparison Study: No prior studies. Performing Technologist: Darlin Coco RDMS, RVT  Examination Guidelines: A complete evaluation includes B-mode imaging, spectral  Doppler, color Doppler, and power Doppler as needed of all accessible portions of each vessel. Bilateral testing is considered an integral part of a complete examination. Limited examinations for reoccurring indications may be performed as noted. +-----------------+-------------+----------+-----------+ Right Cephalic   Diameter (cm)Depth (cm) Findings   +-----------------+-------------+----------+-----------+ Shoulder             0.32        0.33               +-----------------+-------------+----------+-----------+ Prox upper arm       0.31        0.25               +-----------------+-------------+----------+-----------+ Mid upper arm        0.25        0.20               +-----------------+-------------+----------+-----------+ Dist upper arm       0.28        0.22               +-----------------+-------------+----------+-----------+ Antecubital fossa    0.42        0.28   IV in place +-----------------+-------------+----------+-----------+ Prox forearm         0.27        0.18               +-----------------+-------------+----------+-----------+ Mid forearm          0.20        0.20               +-----------------+-------------+----------+-----------+ Dist forearm         0.21        0.20               +-----------------+-------------+----------+-----------+ Wrist                0.18        0.22               +-----------------+-------------+----------+-----------+ +-----------------+-------------+----------+---------+ Left Cephalic    Diameter (cm)Depth (cm)Findings  +-----------------+-------------+----------+---------+ Shoulder             0.23        0.26             +-----------------+-------------+----------+---------+ Prox upper arm       0.28  0.23             +-----------------+-------------+----------+---------+ Mid upper arm        0.24        0.23             +-----------------+-------------+----------+---------+ Dist  upper arm       0.26        0.26             +-----------------+-------------+----------+---------+ Antecubital fossa    0.36        0.36             +-----------------+-------------+----------+---------+ Prox forearm         0.27        0.36   branching +-----------------+-------------+----------+---------+ Mid forearm          0.25        0.21             +-----------------+-------------+----------+---------+ Dist forearm         0.22        0.35   branching +-----------------+-------------+----------+---------+ Wrist                0.20        0.24             +-----------------+-------------+----------+---------+ +-----------------+-------------+----------+---------+ Left Basilic     Diameter (cm)Depth (cm)Findings  +-----------------+-------------+----------+---------+ Prox upper arm       0.42                         +-----------------+-------------+----------+---------+ Mid upper arm        0.32               branching +-----------------+-------------+----------+---------+ Dist upper arm       0.20                         +-----------------+-------------+----------+---------+ Antecubital fossa    0.19                         +-----------------+-------------+----------+---------+ Prox forearm         0.23                         +-----------------+-------------+----------+---------+ Mid forearm          0.14                         +-----------------+-------------+----------+---------+ Distal forearm       0.15                         +-----------------+-------------+----------+---------+ Wrist                0.12                         +-----------------+-------------+----------+---------+ *See table(s) above for measurements and observations.  Diagnosing physician: Servando Snare MD Electronically signed by Servando Snare MD on 03/25/2022 at 5:27:02 PM.    Final    HYBRID OR IMAGING ([place])  Result Date: 03/26/2022 There is no  interpretation for this exam.  This order is for images obtained during a surgical procedure.  Please See "Surgeries" Tab for more information regarding the procedure.    Labs: [employer] 03/24/22 0231 03/24/22 3662 03/25/22 0120 03/26/22 0159 03/27/22 9476  NA 141  --  139 139 134*  K 4.4  --  4.0 4.3 4.2  CL 114*  --  111 112* 102  CO2 18*  --  19* 17* 22  GLUCOSE 104*  --  167* 88 262*  BUN 67*  --  68* 67* 48*  CREATININE 8.77*  --  8.67* 8.64* 6.73*  CALCIUM 7.7*  --  7.5* 7.7* 7.7*  PHOS  --  5.7* 5.6* 5.5* 5.7*   CBC Recent Labs  Lab 03/24/22 0231 03/24/22 2019 03/25/22 0120  WBC 7.0  --  5.7  NEUTROABS 4.3  --   --   HGB 7.9* 8.0* 7.4*  HCT 25.8* 24.4* 23.8*  MCV 87.2  --  85.6  PLT 382  --  345    Medications:     amLODipine  10 mg Oral Daily   carvedilol  6.25 mg Oral BID WC   Chlorhexidine Gluconate Cloth  6 each Topical Q0600   cloNIDine  0.1 mg Oral TID   heparin injection (subcutaneous)  5,000 Units Subcutaneous Q8H   nicotine  21 mg Transdermal Daily   sevelamer carbonate  800 mg Oral TID WC   sodium chloride flush  3 mL Intravenous Q12H      Otelia Santee, MD 03/27/2022, 7:14 AM

## 2022-03-27 NOTE — Progress Notes (Signed)
Discharge criteria complete at this time. Discharge paperwork reviewed with client at this time. Ivs have been removed. No further requests have been made. Wife made aware and is by the bedside. Patient states his wife will transfer him home. Pharmacy to deliver patients discharge medications to bedside.

## 2022-03-27 NOTE — Progress Notes (Signed)
Levi Hodges is a 46 y.o. male patient. Patient has attended HD this shift for CKD5 now progressing to ESRD. HD started on 03/26/2022. Right IJ catheter placed on 03/25/2022. Nephrology following. Patients wife is by the bedside at this time. Blood pressure monitoring continued. Patient refuses telemetry monitoring. MD is aware. Patient requests to leave the facility AMA. Patient advised on the risks vs benefits of leaving the facility against medical advice and patient agrees to now stay in the facility.    1. Hypertensive emergency   2. ARF (acute renal failure) (HCC)    Past Medical History:  Diagnosis Date   Diabetes mellitus without complication ([place])    Hypertension    Renal disorder    Current Facility-Administered Medications  Medication Dose Route Frequency Provider Last Rate Last Admin   0.9 %  sodium chloride infusion  100 mL Intravenous PRN Baglia, Corrina, PA-C       0.9 %  sodium chloride infusion  100 mL Intravenous PRN Baglia, Corrina, PA-C       acetaminophen (TYLENOL) tablet 650 mg  650 mg Oral Q6H PRN Baglia, Corrina, PA-C   650 mg at 03/24/22 0932   Or   acetaminophen (TYLENOL) suppository 650 mg  650 mg Rectal Q6H PRN Baglia, Corrina, PA-C       alteplase (CATHFLO ACTIVASE) injection 2 mg  2 mg Intracatheter Once PRN Baglia, Corrina, PA-C       amLODipine (NORVASC) tablet 10 mg  10 mg Oral Daily Baglia, Corrina, PA-C   10 mg at 03/26/22 0805   carvedilol (COREG) tablet 6.25 mg  6.25 mg Oral BID WC Baglia, Corrina, PA-C   6.25 mg at 03/26/22 1711   Chlorhexidine Gluconate Cloth 2 % PADS 6 each  6 each Topical Q0600 Baglia, Corrina, PA-C   6 each at 03/26/22 0544   cloNIDine (CATAPRES) tablet 0.1 mg  0.1 mg Oral TID Thurnell Lose, MD   0.1 mg at 03/26/22 2154   ferric gluconate (FERRLECIT) 250 mg in sodium chloride 0.9 % 250 mL IVPB  250 mg Intravenous Once Thurnell Lose, MD       ferric gluconate (FERRLECIT) 250 mg in sodium chloride 0.9 % 250 mL IVPB  250 mg  Intravenous Once Dwana Melena, MD 135 mL/hr at 03/27/22 1005 250 mg at 03/27/22 1005   heparin injection 1,000 Units  1,000 Units Dialysis PRN Baglia, Corrina, PA-C       heparin injection 5,000 Units  5,000 Units Subcutaneous Q8H Baglia, Corrina, PA-C   5,000 Units at 03/27/22 0551   hydrALAZINE (APRESOLINE) injection 10 mg  10 mg Intravenous Q4H PRN Baglia, Corrina, PA-C   10 mg at 03/26/22 0543   HYDROmorphone (DILAUDID) injection 0.5 mg  0.5 mg Intravenous Q8H PRN Thurnell Lose, MD   0.5 mg at 03/26/22 1708   labetalol (NORMODYNE) injection 10 mg  10 mg Intravenous Q2H PRN Baglia, Corrina, PA-C   10 mg at 03/24/22 1837   lidocaine (PF) (XYLOCAINE) 1 % injection 5 mL  5 mL Intradermal PRN Baglia, Corrina, PA-C       lidocaine-prilocaine (EMLA) cream 1 application.  1 application. Topical PRN Baglia, Corrina, PA-C       nicotine (NICODERM CQ - dosed in mg/24 hours) patch 21 mg  21 mg Transdermal Daily Baglia, Corrina, PA-C   21 mg at 03/25/22 0850   ondansetron (ZOFRAN) tablet 4 mg  4 mg Oral Q6H PRN Baglia, Corrina, PA-C  Or   ondansetron (ZOFRAN) injection 4 mg  4 mg Intravenous Q6H PRN Baglia, Corrina, PA-C   4 mg at 03/26/22 1405   pentafluoroprop-tetrafluoroeth (GEBAUERS) aerosol 1 application.  1 application. Topical PRN Baglia, Corrina, PA-C       sevelamer carbonate (RENVELA) tablet 800 mg  800 mg Oral TID WC Baglia, Corrina, PA-C   800 mg at 03/26/22 1712   sodium chloride flush (NS) 0.9 % injection 3 mL  3 mL Intravenous Q12H Baglia, Corrina, PA-C   3 mL at 03/26/22 2159   No Known Allergies Principal Problem:   ARF (acute renal failure) (HCC) Active Problems:   Tobacco abuse   Well controlled type 2 diabetes mellitus (HCC)   Fluid overload   Hypertensive emergency   Normocytic anemia   Elevated troponin   Swelling of lower extremity   Pulmonary nodule   Metabolic acidosis  Blood pressure (!) 163/89, pulse 68, temperature 98.5 F (36.9 C), resp. rate 16, height 5'  9" (1.753 m), weight 73.9 kg, SpO2 100 %.  Subjective Objective: Vital signs: (most recent): Blood pressure (!) 163/89, pulse 68, temperature 98.5 F (36.9 C), resp. rate 16, height '5\' 9"'$  (1.753 m), weight 73.9 kg, SpO2 100 %.   Assessment & Plan  Layman Gully A Rhya Shan 03/27/2022

## 2022-03-27 NOTE — Progress Notes (Signed)
removed 2083ms net fluid no complaints no complications. tolerated tx well. pre bp 187/105 post bp 164/91 pre weight 73.9kg post weight 71.5kg gave aranesp, ferrelicit as ordered. catheter ran well packed with heparin clamped and capped.

## 2022-03-28 ENCOUNTER — Telehealth: Payer: Self-pay | Admitting: Nurse Practitioner

## 2022-03-28 NOTE — Telephone Encounter (Signed)
Transition of care contact from inpatient facility  Date of discharge: 03/27/2022 Date of contact: 03/28/2022 Method: Phone Spoke to: Spouse  Patient contacted to discuss transition of care from recent inpatient hospitalization. Patient was admitted to [facility] from 05/22-05/25/2023 with discharge diagnosis of CKD 5 now progressed to ESRD  Medication changes were reviewed.  Patient will follow up with his/her outpatient HD unit on: 03/28/2022

## 2022-04-03 ENCOUNTER — Ambulatory Visit (INDEPENDENT_AMBULATORY_CARE_PROVIDER_SITE_OTHER): Payer: BC Managed Care – PPO | Admitting: Internal Medicine

## 2022-04-03 ENCOUNTER — Encounter: Payer: Self-pay | Admitting: Internal Medicine

## 2022-04-03 VITALS — BP 130/90 | HR 78 | Resp 18 | Ht 69.0 in | Wt 161.4 lb

## 2022-04-03 DIAGNOSIS — R200 Anesthesia of skin: Secondary | ICD-10-CM | POA: Diagnosis not present

## 2022-04-03 DIAGNOSIS — Z720 Tobacco use: Secondary | ICD-10-CM

## 2022-04-03 DIAGNOSIS — E119 Type 2 diabetes mellitus without complications: Secondary | ICD-10-CM

## 2022-04-03 DIAGNOSIS — M7989 Other specified soft tissue disorders: Secondary | ICD-10-CM

## 2022-04-03 DIAGNOSIS — N186 End stage renal disease: Secondary | ICD-10-CM

## 2022-04-03 DIAGNOSIS — I10 Essential (primary) hypertension: Secondary | ICD-10-CM

## 2022-04-03 NOTE — Assessment & Plan Note (Signed)
Resolved with dialysis. He is aware of fluid restrictions and following those.

## 2022-04-03 NOTE — Progress Notes (Signed)
   Subjective:   Patient ID: Levi Hodges, male    DOB: 01/05/1976, 46 y.o.   MRN: [id number]  HPI The patient is a 46 YO man coming in for hospital follow up (newly on dialysis, unable to afford medications for 1-2 years and severe hypertension). He is doing poorly since leaving hospital. Passed out at dialysis yesterday. Has not taken BP medications since Tuesday because of low BP and dizziness/lightheadedness. Having numbness to both feet which started suddenly some time ago. Not evaluated in the hospital he was told this was due to swelling. Swelling is now down due to dialysis and not improving. Vision problems with blurred vision and does not have eye insurance yet or podiatry. Cannot trim toenails. Unable to work and out of work currently. Working on Multimedia programmer.  PMH, [facility], social history reviewed and updated  Review of Systems  Constitutional:  Positive for activity change, appetite change and fatigue.  HENT: Negative.    Eyes: Negative.   Respiratory:  Negative for cough, chest tightness and shortness of breath.   Cardiovascular:  Negative for chest pain, palpitations and leg swelling.  Gastrointestinal:  Negative for abdominal distention, abdominal pain, constipation, diarrhea, nausea and vomiting.  Musculoskeletal:  Positive for arthralgias and neck pain.  Skin: Negative.   Neurological:  Positive for dizziness, syncope, weakness, light-headedness and numbness. Negative for headaches.  Psychiatric/Behavioral: Negative.     Objective:  Physical Exam Constitutional:      Appearance: He is well-developed. He is ill-appearing.  HENT:     Head: Normocephalic and atraumatic.  Cardiovascular:     Rate and Rhythm: Normal rate and regular rhythm.     Comments: Access left arm without thrill or bruit Pulmonary:     Effort: Pulmonary effort is normal. No respiratory distress.     Breath sounds: Normal breath sounds. No wheezing or rales.  Abdominal:     General: Bowel sounds  are normal. There is no distension.     Palpations: Abdomen is soft.     Tenderness: There is no abdominal tenderness. There is no rebound.  Musculoskeletal:        General: Tenderness present.     Cervical back: Normal range of motion.  Skin:    General: Skin is warm and dry.  Neurological:     Mental Status: He is alert and oriented to person, place, and time.     Sensory: Sensory deficit present.     Coordination: Coordination abnormal.     Comments: Cane for ambulation and lack of sensation to knees bilaterally    Vitals:   04/03/22 0929  BP: 130/90  Pulse: 78  Resp: 18  SpO2: 100%  Weight: 161 lb 6.4 oz (73.2 kg)  Height: '5\' 9"'$  (1.753 m)    Assessment & Plan:

## 2022-04-03 NOTE — Assessment & Plan Note (Signed)
It is unclear at this time if his diabetes is resolved due to renal failure and dialysis. Reasonable to monitor sugars and if elevated consider treating.

## 2022-04-03 NOTE — Assessment & Plan Note (Signed)
We discussed could have been related to fluid but since this started rapidly during time when he was running severely elevated BP he could have had hemorrhagic stroke. No head CT done and he now has insurance so ordered CT head to assess for past stroke. This will help with prognosis to determine if this is likely to reverse (if due to fluid hopefully there will be some improvement). If due to stroke could still improve but less likely. This will impact his ability to work long term. Use cane for balance.

## 2022-04-03 NOTE — Assessment & Plan Note (Signed)
Counseled and unable to address adequately today given other health concerns which are serious and life threatening.

## 2022-04-03 NOTE — Patient Instructions (Addendum)
For [place] use 40 units of the toujeo daily.  We will stop the clonidine for blood pressure. We will also stop the hydralazine.  Keep taking amlodipine and the coreg for blood pressure.  We will get the head ct to check for any old stroke.  Call us in 2-3 weeks or send a mychart message with blood pressure readings and let us know if problems sooner.

## 2022-04-03 NOTE — Assessment & Plan Note (Signed)
Started dialysis in hospital and has temporary access. They put access in left arm but this has no thrill or bruit. He is getting revision of this July. He is doing dialysis Tu/Th/Sa currently and taking binders.

## 2022-04-03 NOTE — Assessment & Plan Note (Signed)
BP is at goal today and has not had medication for 2-3 days due to dizziness. Passed out at dialysis yesterday. We will stop clonidine and hydralazine. Continue amlodipine 10 mg daily and coreg 6.35 mg BID. Do not take morning of dialysis. He will call or mychart in 2-3 weeks about BP readings or sooner if abnormal.

## 2022-04-04 DIAGNOSIS — N186 End stage renal disease: Secondary | ICD-10-CM | POA: Diagnosis not present

## 2022-04-04 DIAGNOSIS — Z992 Dependence on renal dialysis: Secondary | ICD-10-CM | POA: Diagnosis not present

## 2022-04-04 DIAGNOSIS — N2581 Secondary hyperparathyroidism of renal origin: Secondary | ICD-10-CM | POA: Diagnosis not present

## 2022-04-07 DIAGNOSIS — N2581 Secondary hyperparathyroidism of renal origin: Secondary | ICD-10-CM | POA: Diagnosis not present

## 2022-04-07 DIAGNOSIS — N186 End stage renal disease: Secondary | ICD-10-CM | POA: Diagnosis not present

## 2022-04-07 DIAGNOSIS — Z992 Dependence on renal dialysis: Secondary | ICD-10-CM | POA: Diagnosis not present

## 2022-04-09 DIAGNOSIS — N2581 Secondary hyperparathyroidism of renal origin: Secondary | ICD-10-CM | POA: Diagnosis not present

## 2022-04-09 DIAGNOSIS — N186 End stage renal disease: Secondary | ICD-10-CM | POA: Diagnosis not present

## 2022-04-09 DIAGNOSIS — Z992 Dependence on renal dialysis: Secondary | ICD-10-CM | POA: Diagnosis not present

## 2022-04-11 DIAGNOSIS — Z992 Dependence on renal dialysis: Secondary | ICD-10-CM | POA: Diagnosis not present

## 2022-04-11 DIAGNOSIS — N186 End stage renal disease: Secondary | ICD-10-CM | POA: Diagnosis not present

## 2022-04-11 DIAGNOSIS — N2581 Secondary hyperparathyroidism of renal origin: Secondary | ICD-10-CM | POA: Diagnosis not present

## 2022-04-14 DIAGNOSIS — N186 End stage renal disease: Secondary | ICD-10-CM | POA: Diagnosis not present

## 2022-04-14 DIAGNOSIS — N2581 Secondary hyperparathyroidism of renal origin: Secondary | ICD-10-CM | POA: Diagnosis not present

## 2022-04-14 DIAGNOSIS — Z992 Dependence on renal dialysis: Secondary | ICD-10-CM | POA: Diagnosis not present

## 2022-04-16 DIAGNOSIS — N2581 Secondary hyperparathyroidism of renal origin: Secondary | ICD-10-CM | POA: Diagnosis not present

## 2022-04-16 DIAGNOSIS — Z992 Dependence on renal dialysis: Secondary | ICD-10-CM | POA: Diagnosis not present

## 2022-04-16 DIAGNOSIS — N186 End stage renal disease: Secondary | ICD-10-CM | POA: Diagnosis not present

## 2022-04-17 ENCOUNTER — Encounter: Payer: Self-pay | Admitting: Internal Medicine

## 2022-04-17 ENCOUNTER — Encounter: Payer: Medicaid Other | Admitting: Internal Medicine

## 2022-04-18 DIAGNOSIS — Z992 Dependence on renal dialysis: Secondary | ICD-10-CM | POA: Diagnosis not present

## 2022-04-18 DIAGNOSIS — N186 End stage renal disease: Secondary | ICD-10-CM | POA: Diagnosis not present

## 2022-04-18 DIAGNOSIS — N2581 Secondary hyperparathyroidism of renal origin: Secondary | ICD-10-CM | POA: Diagnosis not present

## 2022-04-21 ENCOUNTER — Ambulatory Visit
Admission: RE | Admit: 2022-04-21 | Discharge: 2022-04-21 | Disposition: A | Discharge status: Home / Self Care | Payer: BC Managed Care – PPO | Source: Ambulatory Visit | Attending: Internal Medicine | Admitting: Internal Medicine

## 2022-04-21 DIAGNOSIS — R200 Anesthesia of skin: Secondary | ICD-10-CM

## 2022-04-21 DIAGNOSIS — N186 End stage renal disease: Secondary | ICD-10-CM | POA: Diagnosis not present

## 2022-04-21 DIAGNOSIS — Z992 Dependence on renal dialysis: Secondary | ICD-10-CM | POA: Diagnosis not present

## 2022-04-21 DIAGNOSIS — N2581 Secondary hyperparathyroidism of renal origin: Secondary | ICD-10-CM | POA: Diagnosis not present

## 2022-04-23 DIAGNOSIS — Z992 Dependence on renal dialysis: Secondary | ICD-10-CM | POA: Diagnosis not present

## 2022-04-23 DIAGNOSIS — N2581 Secondary hyperparathyroidism of renal origin: Secondary | ICD-10-CM | POA: Diagnosis not present

## 2022-04-23 DIAGNOSIS — N186 End stage renal disease: Secondary | ICD-10-CM | POA: Diagnosis not present

## 2022-04-25 ENCOUNTER — Other Ambulatory Visit: Payer: Self-pay | Admitting: *Deleted

## 2022-04-25 DIAGNOSIS — N186 End stage renal disease: Secondary | ICD-10-CM | POA: Diagnosis not present

## 2022-04-25 DIAGNOSIS — Z992 Dependence on renal dialysis: Secondary | ICD-10-CM | POA: Diagnosis not present

## 2022-04-25 DIAGNOSIS — N2581 Secondary hyperparathyroidism of renal origin: Secondary | ICD-10-CM | POA: Diagnosis not present

## 2022-04-25 MED ORDER — CARVEDILOL 6.25 MG PO TABS: 6.2500 mg | ORAL_TABLET | Freq: Two times a day (BID) | ORAL | 0 refills | Status: DC

## 2022-04-25 MED ORDER — SIMVASTATIN 20 MG PO TABS: ORAL_TABLET | ORAL | 3 refills | Status: DC

## 2022-04-25 MED ORDER — AMLODIPINE BESYLATE 10 MG PO TABS: 10.0000 mg | ORAL_TABLET | Freq: Every day | ORAL | 3 refills | Status: DC

## 2022-04-28 DIAGNOSIS — N2581 Secondary hyperparathyroidism of renal origin: Secondary | ICD-10-CM | POA: Diagnosis not present

## 2022-04-28 DIAGNOSIS — N186 End stage renal disease: Secondary | ICD-10-CM | POA: Diagnosis not present

## 2022-04-28 DIAGNOSIS — Z992 Dependence on renal dialysis: Secondary | ICD-10-CM | POA: Diagnosis not present

## 2022-04-30 DIAGNOSIS — N186 End stage renal disease: Secondary | ICD-10-CM | POA: Diagnosis not present

## 2022-04-30 DIAGNOSIS — Z992 Dependence on renal dialysis: Secondary | ICD-10-CM | POA: Diagnosis not present

## 2022-04-30 DIAGNOSIS — N2581 Secondary hyperparathyroidism of renal origin: Secondary | ICD-10-CM | POA: Diagnosis not present

## 2022-05-02 DIAGNOSIS — Z992 Dependence on renal dialysis: Secondary | ICD-10-CM | POA: Diagnosis not present

## 2022-05-02 DIAGNOSIS — N2581 Secondary hyperparathyroidism of renal origin: Secondary | ICD-10-CM | POA: Diagnosis not present

## 2022-05-02 DIAGNOSIS — N186 End stage renal disease: Secondary | ICD-10-CM | POA: Diagnosis not present

## 2022-05-05 ENCOUNTER — Encounter: Payer: Self-pay | Admitting: Neurology

## 2022-05-05 DIAGNOSIS — N2581 Secondary hyperparathyroidism of renal origin: Secondary | ICD-10-CM | POA: Diagnosis not present

## 2022-05-05 DIAGNOSIS — Z992 Dependence on renal dialysis: Secondary | ICD-10-CM | POA: Diagnosis not present

## 2022-05-05 DIAGNOSIS — N186 End stage renal disease: Secondary | ICD-10-CM | POA: Diagnosis not present

## 2022-05-07 DIAGNOSIS — N186 End stage renal disease: Secondary | ICD-10-CM | POA: Diagnosis not present

## 2022-05-07 DIAGNOSIS — Z992 Dependence on renal dialysis: Secondary | ICD-10-CM | POA: Diagnosis not present

## 2022-05-07 DIAGNOSIS — N2581 Secondary hyperparathyroidism of renal origin: Secondary | ICD-10-CM | POA: Diagnosis not present

## 2022-05-08 ENCOUNTER — Encounter (HOSPITAL_COMMUNITY): Payer: Medicaid Other

## 2022-05-09 DIAGNOSIS — N186 End stage renal disease: Secondary | ICD-10-CM | POA: Diagnosis not present

## 2022-05-09 DIAGNOSIS — Z992 Dependence on renal dialysis: Secondary | ICD-10-CM | POA: Diagnosis not present

## 2022-05-09 DIAGNOSIS — N2581 Secondary hyperparathyroidism of renal origin: Secondary | ICD-10-CM | POA: Diagnosis not present

## 2022-05-09 NOTE — Telephone Encounter (Signed)
Letter sent to patient's MyChart

## 2022-05-12 DIAGNOSIS — Z992 Dependence on renal dialysis: Secondary | ICD-10-CM | POA: Diagnosis not present

## 2022-05-12 DIAGNOSIS — N186 End stage renal disease: Secondary | ICD-10-CM | POA: Diagnosis not present

## 2022-05-12 DIAGNOSIS — N2581 Secondary hyperparathyroidism of renal origin: Secondary | ICD-10-CM | POA: Diagnosis not present

## 2022-05-13 ENCOUNTER — Ambulatory Visit (INDEPENDENT_AMBULATORY_CARE_PROVIDER_SITE_OTHER): Payer: Medicaid Other | Admitting: Physician Assistant

## 2022-05-13 ENCOUNTER — Other Ambulatory Visit: Payer: Self-pay

## 2022-05-13 ENCOUNTER — Ambulatory Visit (INDEPENDENT_AMBULATORY_CARE_PROVIDER_SITE_OTHER)
Admission: RE | Admit: 2022-05-13 | Discharge: 2022-05-13 | Disposition: A | Discharge status: Home / Self Care | Payer: BC Managed Care – PPO | Source: Ambulatory Visit | Attending: Vascular Surgery | Admitting: Vascular Surgery

## 2022-05-13 ENCOUNTER — Ambulatory Visit (HOSPITAL_COMMUNITY)
Admission: RE | Admit: 2022-05-13 | Discharge: 2022-05-13 | Disposition: A | Discharge status: Home / Self Care | Payer: BC Managed Care – PPO | Source: Ambulatory Visit | Attending: Vascular Surgery | Admitting: Vascular Surgery

## 2022-05-13 ENCOUNTER — Other Ambulatory Visit: Payer: Self-pay | Admitting: *Deleted

## 2022-05-13 VITALS — BP 170/92 | HR 74 | Temp 98.2°F | Resp 18 | Ht 71.0 in | Wt 158.1 lb

## 2022-05-13 DIAGNOSIS — N186 End stage renal disease: Secondary | ICD-10-CM

## 2022-05-13 DIAGNOSIS — Z992 Dependence on renal dialysis: Secondary | ICD-10-CM

## 2022-05-13 NOTE — H&P (View-Only) (Signed)
POST OPERATIVE OFFICE NOTE    CC:  F/u for surgery  HPI:  This is a 46 y.o. male who is s/p right IJ [facility] and left brachiocephalic AV fistula creation on 03/26/2022 by Dr. Stanford Breed.  The patient has had no other HD access attempts before. He has recently been classified as ESRD and started dialysis with the IJ [facility] on MWF. The patient is right handed.  Pt returns today for follow up.  Pt states he has not been in much pain. He is frustrated hearing that his fistula has not matured. Denies steal symptoms.   No Known Allergies  Current Outpatient Medications  Medication Sig Dispense Refill   acetaminophen (TYLENOL) 325 MG tablet Take 650 mg by mouth every 6 (six) hours as needed for headache.     amLODipine (NORVASC) 10 MG tablet Take 1 tablet (10 mg total) by mouth daily. 90 tablet 3   carvedilol (COREG) 6.25 MG tablet Take 1-2 tablets (6.25-12.5 mg total) by mouth 2 (two) times daily with a meal. 270 tablet 0   gabapentin (NEURONTIN) 100 MG capsule Take 100 mg by mouth 2 (two) times daily.     glucose blood (ONETOUCH VERIO) test strip 4x daily 150 each 12   sevelamer carbonate (RENVELA) 800 MG tablet Take 1 tablet (800 mg total) by mouth 3 (three) times daily with meals. 90 tablet 0   simvastatin (ZOCOR) 20 MG tablet TAKE 1 TABLET(20 MG) BY MOUTH AT BEDTIME 90 tablet 3   Blood Glucose Monitoring Suppl ([place]) w/Device KIT 1 Device by Does not apply route as directed. (Patient not taking: Reported on 04/03/2022) 1 kit 0   Insulin Pen Needle (PEN NEEDLES) 31G X 5 MM MISC 1 Package by Does not apply route daily. (Patient not taking: Reported on 04/03/2022) 100 each 3   No current facility-administered medications for this visit.     ROS:  See HPI  Physical Exam:   Incision:  L antecubital fossa incision well healed Extremities:  Palpable 2+ radial pulse in L arm. L brachiocephalic fistula has no thrill and cannot be heard on auscultation. Neuro: intact, A&Ox3  Upper  Extremity Vein Mapping (05/13/2022) +-----------------+-------------+----------+--------------+  Right Cephalic   Diameter (cm)Depth (cm)   Findings     +-----------------+-------------+----------+--------------+  Shoulder                                not visualized  +-----------------+-------------+----------+--------------+  Prox upper arm                          not visualized  +-----------------+-------------+----------+--------------+  Mid upper arm        0.23                               +-----------------+-------------+----------+--------------+  Dist upper arm       0.13                minimal flow   +-----------------+-------------+----------+--------------+  Antecubital fossa 0.35 / 0.28                           +-----------------+-------------+----------+--------------+  Prox forearm         0.29                 branching     +-----------------+-------------+----------+--------------+  Mid forearm          0.21                               +-----------------+-------------+----------+--------------+  Dist forearm         0.18                 branching     +-----------------+-------------+----------+--------------+   +-----------------+-------------+----------+--------+  Right Basilic    Diameter (cm)Depth (cm)Findings  +-----------------+-------------+----------+--------+  Mid upper arm        0.40                         +-----------------+-------------+----------+--------+  Dist upper arm       0.35                         +-----------------+-------------+----------+--------+  Antecubital fossa    0.37                         +-----------------+-------------+----------+--------+  Prox forearm         0.16                         +-----------------+-------------+----------+--------+   +-----------------+-------------+----------+--------+  Left Cephalic    Diameter (cm)Depth (cm)Findings   +-----------------+-------------+----------+--------+  Prox upper arm       0.16                         +-----------------+-------------+----------+--------+  Mid upper arm        0.13                         +-----------------+-------------+----------+--------+  Dist upper arm       0.13                         +-----------------+-------------+----------+--------+  Antecubital fossa    0.19                         +-----------------+-------------+----------+--------+  Prox forearm         0.16                         +-----------------+-------------+----------+--------+  Mid forearm          0.16                         +-----------------+-------------+----------+--------+  Dist forearm         0.12                         +-----------------+-------------+----------+--------+   +-----------------+-------------+----------+---------+  Left Basilic     Diameter (cm)Depth (cm)Findings   +-----------------+-------------+----------+---------+  Prox upper arm       0.46                          +-----------------+-------------+----------+---------+  Mid upper arm        0.47                          +-----------------+-------------+----------+---------+  Dist upper arm    0.42 /  0.35           branching  +-----------------+-------------+----------+---------+  Antecubital fossa    0.20                          +-----------------+-------------+----------+---------+  Prox forearm         0.21                          +-----------------+-------------+----------+---------+   Upper Extremity Arterial Duplex (05/13/2022)  Right Pre-Dialysis Findings:  +-----------------------+----------+--------------------+---------+--------  +  Location               PSV (cm/s)Intralum. Diam. (cm)Waveform  Comments  +-----------------------+----------+--------------------+---------+--------  +  Brachial Antecub. [id number]         0.46                triphasic            +-----------------------+----------+--------------------+---------+--------  +  Radial Art at Wrist    80        0.29                triphasic            +-----------------------+----------+--------------------+---------+--------  +  Ulnar Art at Wrist     82        0.26                triphasic            +-----------------------+----------+--------------------+---------+--------  +   Left Pre-Dialysis Findings:  +-----------------------+----------+--------------------+---------+--------  +  Location               PSV (cm/s)Intralum. Diam. (cm)Waveform  Comments  +-----------------------+----------+--------------------+---------+--------  +  Brachial Antecub. fossa83        0.46                triphasic            +-----------------------+----------+--------------------+---------+--------  +  Radial Art at Wrist    23        0.22                triphasic            +-----------------------+----------+--------------------+---------+--------  +  Ulnar Art at Wrist     96        0.25                triphasic            +-----------------------+----------+--------------------+---------+--------  +   Assessment/Plan:  This is a 47 y.o. male who is s/p: right IJ [facility] and left brachiocephalic AV fistula creation on 03/26/2022 by Dr. Stanford Breed   -Left brachiocephalic AV fistula has failed to mature. Left cephalic vein has remained the same size since before the surgery on 03/26/2022. -L fistula thrill cannot be felt and bruit cannot be auscultated. -Patient has good arterial flow in the L arm with triphasic flow in the brachial, ulnar, and radial arteries, as seen on upper extremity arterial duplex. Palpable 2+ radial and brachial pulses. -Upper extremity vein mapping shows viable basilic veins in the left and right arm with measurements >43m through the entire upper arm. -Patient is a good candidate for L  arm basilic AV fistula, since he is R hand dominant. I have explained all the risks and benefits of the procedure, including that this surgery will require 2 stages. The patient is in agreement with this plan. -Will schedule patient  for L 1st stage basilic fistula creation with Dr.Hakwen on non dialysis day. Patient currently dialyzes on MWF. For the time being he will continue to dialyze with R IJ [facility].    Vicente Serene, PA-C [facility] [phone]   Clinic MD:  [facility]

## 2022-05-13 NOTE — Progress Notes (Signed)
POST OPERATIVE OFFICE NOTE    CC:  F/u for surgery  HPI:  This is a 46 y.o. male who is s/p right IJ [facility] and left brachiocephalic AV fistula creation on 03/26/2022 by Dr. Stanford Breed.  The patient has had no other HD access attempts before. He has recently been classified as ESRD and started dialysis with the IJ [facility] on MWF. The patient is right handed.  Pt returns today for follow up.  Pt states he has not been in much pain. He is frustrated hearing that his fistula has not matured. Denies steal symptoms.   No Known Allergies  Current Outpatient Medications  Medication Sig Dispense Refill   acetaminophen (TYLENOL) 325 MG tablet Take 650 mg by mouth every 6 (six) hours as needed for headache.     amLODipine (NORVASC) 10 MG tablet Take 1 tablet (10 mg total) by mouth daily. 90 tablet 3   carvedilol (COREG) 6.25 MG tablet Take 1-2 tablets (6.25-12.5 mg total) by mouth 2 (two) times daily with a meal. 270 tablet 0   gabapentin (NEURONTIN) 100 MG capsule Take 100 mg by mouth 2 (two) times daily.     glucose blood (ONETOUCH VERIO) test strip 4x daily 150 each 12   sevelamer carbonate (RENVELA) 800 MG tablet Take 1 tablet (800 mg total) by mouth 3 (three) times daily with meals. 90 tablet 0   simvastatin (ZOCOR) 20 MG tablet TAKE 1 TABLET(20 MG) BY MOUTH AT BEDTIME 90 tablet 3   Blood Glucose Monitoring Suppl ([place]) w/Device KIT 1 Device by Does not apply route as directed. (Patient not taking: Reported on 04/03/2022) 1 kit 0   Insulin Pen Needle (PEN NEEDLES) 31G X 5 MM MISC 1 Package by Does not apply route daily. (Patient not taking: Reported on 04/03/2022) 100 each 3   No current facility-administered medications for this visit.     ROS:  See HPI  Physical Exam:   Incision:  L antecubital fossa incision well healed Extremities:  Palpable 2+ radial pulse in L arm. L brachiocephalic fistula has no thrill and cannot be heard on auscultation. Neuro: intact, A&Ox3  Upper  Extremity Vein Mapping (05/13/2022) +-----------------+-------------+----------+--------------+  Right Cephalic   Diameter (cm)Depth (cm)   Findings     +-----------------+-------------+----------+--------------+  Shoulder                                not visualized  +-----------------+-------------+----------+--------------+  Prox upper arm                          not visualized  +-----------------+-------------+----------+--------------+  Mid upper arm        0.23                               +-----------------+-------------+----------+--------------+  Dist upper arm       0.13                minimal flow   +-----------------+-------------+----------+--------------+  Antecubital fossa 0.35 / 0.28                           +-----------------+-------------+----------+--------------+  Prox forearm         0.29                 branching     +-----------------+-------------+----------+--------------+  Mid forearm          0.21                               +-----------------+-------------+----------+--------------+  Dist forearm         0.18                 branching     +-----------------+-------------+----------+--------------+   +-----------------+-------------+----------+--------+  Right Basilic    Diameter (cm)Depth (cm)Findings  +-----------------+-------------+----------+--------+  Mid upper arm        0.40                         +-----------------+-------------+----------+--------+  Dist upper arm       0.35                         +-----------------+-------------+----------+--------+  Antecubital fossa    0.37                         +-----------------+-------------+----------+--------+  Prox forearm         0.16                         +-----------------+-------------+----------+--------+   +-----------------+-------------+----------+--------+  Left Cephalic    Diameter (cm)Depth (cm)Findings   +-----------------+-------------+----------+--------+  Prox upper arm       0.16                         +-----------------+-------------+----------+--------+  Mid upper arm        0.13                         +-----------------+-------------+----------+--------+  Dist upper arm       0.13                         +-----------------+-------------+----------+--------+  Antecubital fossa    0.19                         +-----------------+-------------+----------+--------+  Prox forearm         0.16                         +-----------------+-------------+----------+--------+  Mid forearm          0.16                         +-----------------+-------------+----------+--------+  Dist forearm         0.12                         +-----------------+-------------+----------+--------+   +-----------------+-------------+----------+---------+  Left Basilic     Diameter (cm)Depth (cm)Findings   +-----------------+-------------+----------+---------+  Prox upper arm       0.46                          +-----------------+-------------+----------+---------+  Mid upper arm        0.47                          +-----------------+-------------+----------+---------+  Dist upper arm    0.42 /  0.35           branching  +-----------------+-------------+----------+---------+  Antecubital fossa    0.20                          +-----------------+-------------+----------+---------+  Prox forearm         0.21                          +-----------------+-------------+----------+---------+   Upper Extremity Arterial Duplex (05/13/2022)  Right Pre-Dialysis Findings:  +-----------------------+----------+--------------------+---------+--------  +  Location               PSV (cm/s)Intralum. Diam. (cm)Waveform  Comments  +-----------------------+----------+--------------------+---------+--------  +  Brachial Antecub. fossa69         0.46                triphasic            +-----------------------+----------+--------------------+---------+--------  +  Radial Art at Wrist    80        0.29                triphasic            +-----------------------+----------+--------------------+---------+--------  +  Ulnar Art at Wrist     82        0.26                triphasic            +-----------------------+----------+--------------------+---------+--------  +   Left Pre-Dialysis Findings:  +-----------------------+----------+--------------------+---------+--------  +  Location               PSV (cm/s)Intralum. Diam. (cm)Waveform  Comments  +-----------------------+----------+--------------------+---------+--------  +  Brachial Antecub. fossa83        0.46                triphasic            +-----------------------+----------+--------------------+---------+--------  +  Radial Art at Wrist    23        0.22                triphasic            +-----------------------+----------+--------------------+---------+--------  +  Ulnar Art at Wrist     96        0.25                triphasic            +-----------------------+----------+--------------------+---------+--------  +   Assessment/Plan:  This is a 46 y.o. male who is s/p: right IJ [facility] and left brachiocephalic AV fistula creation on 03/26/2022 by Dr. Stanford Breed   -Left brachiocephalic AV fistula has failed to mature. Left cephalic vein has remained the same size since before the surgery on 03/26/2022. -L fistula thrill cannot be felt and bruit cannot be auscultated. -Patient has good arterial flow in the L arm with triphasic flow in the brachial, ulnar, and radial arteries, as seen on upper extremity arterial duplex. Palpable 2+ radial and brachial pulses. -Upper extremity vein mapping shows viable basilic veins in the left and right arm with measurements >77m through the entire upper arm. -Patient is a good candidate for L  arm basilic AV fistula, since he is R hand dominant. I have explained all the risks and benefits of the procedure, including that this surgery will require 2 stages. The patient is in agreement with this plan. -Will schedule patient  for L 1st stage basilic fistula creation with Dr.Hakwen on non dialysis day. Patient currently dialyzes on MWF. For the time being he will continue to dialyze with R IJ TDC.    Vicente Serene, PA-C [facility] [phone]   Clinic MD:  [facility]

## 2022-05-14 DIAGNOSIS — N186 End stage renal disease: Secondary | ICD-10-CM | POA: Diagnosis not present

## 2022-05-14 DIAGNOSIS — Z992 Dependence on renal dialysis: Secondary | ICD-10-CM | POA: Diagnosis not present

## 2022-05-14 DIAGNOSIS — N2581 Secondary hyperparathyroidism of renal origin: Secondary | ICD-10-CM | POA: Diagnosis not present

## 2022-05-16 DIAGNOSIS — N186 End stage renal disease: Secondary | ICD-10-CM | POA: Diagnosis not present

## 2022-05-16 DIAGNOSIS — N2581 Secondary hyperparathyroidism of renal origin: Secondary | ICD-10-CM | POA: Diagnosis not present

## 2022-05-16 DIAGNOSIS — Z992 Dependence on renal dialysis: Secondary | ICD-10-CM | POA: Diagnosis not present

## 2022-05-19 DIAGNOSIS — Z992 Dependence on renal dialysis: Secondary | ICD-10-CM | POA: Diagnosis not present

## 2022-05-19 DIAGNOSIS — N2581 Secondary hyperparathyroidism of renal origin: Secondary | ICD-10-CM | POA: Diagnosis not present

## 2022-05-19 DIAGNOSIS — N186 End stage renal disease: Secondary | ICD-10-CM | POA: Diagnosis not present

## 2022-05-21 ENCOUNTER — Encounter (HOSPITAL_COMMUNITY): Payer: Self-pay | Admitting: Vascular Surgery

## 2022-05-21 ENCOUNTER — Other Ambulatory Visit: Payer: Self-pay

## 2022-05-21 DIAGNOSIS — N2581 Secondary hyperparathyroidism of renal origin: Secondary | ICD-10-CM | POA: Diagnosis not present

## 2022-05-21 DIAGNOSIS — N186 End stage renal disease: Secondary | ICD-10-CM | POA: Diagnosis not present

## 2022-05-21 DIAGNOSIS — Z992 Dependence on renal dialysis: Secondary | ICD-10-CM | POA: Diagnosis not present

## 2022-05-21 MED ORDER — SEVELAMER CARBONATE 800 MG PO TABS: 800.0000 mg | ORAL_TABLET | Freq: Three times a day (TID) | ORAL | 11 refills | Status: AC

## 2022-05-21 NOTE — Progress Notes (Signed)
PCP - Dr. Sharlet Salina  Cardiologist - Denies  EP- Denies  Endocrine- Denies  Pulm- Denies  Chest x-ray - 03/26/22 (E)  EKG - 03/24/22 (E)  Stress Test - Denies  ECHO - 03/24/22 (E)  Cardiac Cath - Denies  AICD-na PM-na LOOP-na  Nerve Stimulator- Denies  Dialysis- M-W-F- Pt states he had a full session today  Sleep Study -  Denies CPAP - Denies  LABS-05/22/22: I-Stat 8  ASA- Denies  ERAS- No  HA1C- 03/24/22(E): 5.4 Fasting Blood Sugar - 102-150 Checks Blood Sugar ___1__ times a week  Anesthesia- No  Pt denies having chest pain, sob, or fever during the pre-op phone call. All instructions explained to the pt, with a verbal understanding of the material including: as of today, stop taking all Aspirin (unless instructed by your doctor) and Other Aspirin containing products, Vitamins, Fish oils, and Herbal medications. Also stop all NSAIDS i.e. Advil, Ibuprofen, Motrin, Aleve, Anaprox, Naproxen, BC, Goody Powders, and all Supplements.   How do I manage my blood sugar before surgery? Check your blood sugar the morning of your surgery when you wake up and every 2 hours until you get to the Short Stay unit. If your blood sugar is less than 70 mg/dL, you will need to treat for low blood sugar: Do not take insulin. Treat a low blood sugar (less than 70 mg/dL) with  cup of clear juice (cranberry or apple), 4 glucose tablets, OR glucose gel. Recheck blood sugar in 15 minutes after treatment (to make sure it is greater than 70 mg/dL). If your blood sugar is not greater than 70 mg/dL on recheck, call [phone]  for further instructions. Report your blood sugar to the short stay nurse when you get to Short Stay.  If your CBG is greater than 220 mg/dL, inform the staff upon arrival to Short Stay.  Reviewed and Endorsed by [facility] Patient Education Committee, August 2015  Pt also instructed to wear a mask and social distance if he goes out. The opportunity to ask questions  was provided.

## 2022-05-21 NOTE — Telephone Encounter (Signed)
This medication was rx by another MD. IS it ok refill.Marland KitchenJohny Chess

## 2022-05-21 NOTE — Anesthesia Preprocedure Evaluation (Addendum)
Anesthesia Evaluation  Patient identified by MRN, date of birth, ID band Patient awake    Reviewed: Allergy & Precautions, NPO status , Patient's Chart, lab work & pertinent test results, reviewed documented beta blocker date and time   Airway Mallampati: II  TM Distance: >3 FB Neck ROM: Full    Dental  (+) Missing, Loose, Dental Advisory Given,    Pulmonary Current Smoker,    Pulmonary exam normal breath sounds clear to auscultation       Cardiovascular hypertension, Pt. on medications and Pt. on home beta blockers Normal cardiovascular exam Rhythm:Regular Rate:Normal  TTE 2023 1. Left ventricular ejection fraction, by estimation, is 55 to 60%. The  left ventricle has normal function. The left ventricle has no regional  wall motion abnormalities. There is severe left ventricular hypertrophy.  Left ventricular diastolic parameters  are consistent with Grade II diastolic dysfunction (pseudonormalization).  Elevated left atrial pressure. The average left ventricular global  longitudinal strain is -18.0 % which is low normal. Pattern does not  appear to be consistent with amyloid,  however, could consider CMR in the future given severe LVH and speckled  pattern on echo.  2. Right ventricular systolic function is mildly reduced. The right  ventricular size is normal. There is severely elevated pulmonary artery  systolic pressure. The estimated right ventricular systolic pressure is  09.8 mmHg.  3. A small pericardial effusion is present. The pericardial effusion is  posterior to the left ventricle.  4. The mitral valve is normal in structure. Trivial mitral valve  regurgitation.  5. Tricuspid valve regurgitation is mild to moderate.  6. The aortic valve is tricuspid. There is mild thickening of the aortic  valve. Aortic valve regurgitation is not visualized. Aortic valve  sclerosis is present, with no evidence of aortic  valve stenosis.  7. The inferior vena cava is dilated in size with <50% respiratory  variability, suggesting right atrial pressure of 15 mmHg.    Neuro/Psych negative neurological ROS  negative psych ROS   GI/Hepatic negative GI ROS, Neg liver ROS,   Endo/Other  diabetes, Type 2  Renal/GU ESRF and DialysisRenal disease (diaylsis MWF via R IJ TDC)  negative genitourinary   Musculoskeletal negative musculoskeletal ROS (+)   Abdominal   Peds  Hematology negative hematology ROS (+)   Anesthesia Other Findings   Reproductive/Obstetrics                            Anesthesia Physical Anesthesia Plan  ASA: 3  Anesthesia Plan: MAC and Regional   Post-op Pain Management: Regional block*   Induction: Intravenous  PONV Risk Score and Plan: 0 and Propofol infusion, Treatment may vary due to age or medical condition, Midazolam and Ondansetron  Airway Management Planned: Natural Airway  Additional Equipment:   Intra-op Plan:   Post-operative Plan:   Informed Consent: I have reviewed the patients History and Physical, chart, labs and discussed the procedure including the risks, benefits and alternatives for the proposed anesthesia with the patient or authorized representative who has indicated his/her understanding and acceptance.     Dental advisory given  Plan Discussed with: CRNA  Anesthesia Plan Comments:         Anesthesia Quick Evaluation

## 2022-05-21 NOTE — Addendum Note (Signed)
Addended by: Pricilla Holm A on: 05/21/2022 11:36 AM   Modules accepted: Orders

## 2022-05-22 ENCOUNTER — Ambulatory Visit (HOSPITAL_COMMUNITY): Payer: BC Managed Care – PPO | Admitting: Anesthesiology

## 2022-05-22 ENCOUNTER — Encounter (HOSPITAL_COMMUNITY): Payer: Self-pay | Admitting: Vascular Surgery

## 2022-05-22 ENCOUNTER — Other Ambulatory Visit: Payer: Self-pay

## 2022-05-22 ENCOUNTER — Ambulatory Visit (HOSPITAL_COMMUNITY)
Admission: RE | Admit: 2022-05-22 | Discharge: 2022-05-22 | Disposition: A | Discharge status: Home / Self Care | Payer: BC Managed Care – PPO | Source: Ambulatory Visit | Attending: Vascular Surgery | Admitting: Vascular Surgery

## 2022-05-22 ENCOUNTER — Other Ambulatory Visit (HOSPITAL_COMMUNITY): Payer: Self-pay

## 2022-05-22 ENCOUNTER — Encounter (HOSPITAL_COMMUNITY)
Admission: RE | Disposition: A | Discharge status: Home / Self Care | Payer: Self-pay | Source: Ambulatory Visit | Attending: Vascular Surgery

## 2022-05-22 DIAGNOSIS — I120 Hypertensive chronic kidney disease with stage 5 chronic kidney disease or end stage renal disease: Secondary | ICD-10-CM | POA: Diagnosis not present

## 2022-05-22 DIAGNOSIS — F17200 Nicotine dependence, unspecified, uncomplicated: Secondary | ICD-10-CM | POA: Diagnosis not present

## 2022-05-22 DIAGNOSIS — Z79899 Other long term (current) drug therapy: Secondary | ICD-10-CM | POA: Diagnosis not present

## 2022-05-22 DIAGNOSIS — Z992 Dependence on renal dialysis: Secondary | ICD-10-CM | POA: Insufficient documentation

## 2022-05-22 DIAGNOSIS — N186 End stage renal disease: Secondary | ICD-10-CM | POA: Insufficient documentation

## 2022-05-22 DIAGNOSIS — E1122 Type 2 diabetes mellitus with diabetic chronic kidney disease: Secondary | ICD-10-CM | POA: Diagnosis not present

## 2022-05-22 DIAGNOSIS — N185 Chronic kidney disease, stage 5: Secondary | ICD-10-CM | POA: Diagnosis not present

## 2022-05-22 HISTORY — PX: AV FISTULA PLACEMENT: SHX1204

## 2022-05-22 HISTORY — DX: Depression, unspecified: F32.A

## 2022-05-22 HISTORY — DX: Anemia, unspecified: D64.9

## 2022-05-22 LAB — POCT I-STAT, CHEM 8
BUN: 24 mg/dL — ABNORMAL HIGH (ref 6–20)
Calcium, Ion: 1.04 mmol/L — ABNORMAL LOW (ref 1.15–1.40)
Chloride: 96 mmol/L — ABNORMAL LOW (ref 98–111)
Creatinine, Ser: 7.4 mg/dL — ABNORMAL HIGH (ref 0.61–1.24)
Glucose, Bld: 94 mg/dL (ref 70–99)
HCT: 40 % (ref 39.0–52.0)
Hemoglobin: 13.6 g/dL (ref 13.0–17.0)
Potassium: 3.7 mmol/L (ref 3.5–5.1)
Sodium: 138 mmol/L (ref 135–145)
TCO2: 31 mmol/L (ref 22–32)

## 2022-05-22 LAB — GLUCOSE, CAPILLARY
Glucose-Capillary: 103 mg/dL — ABNORMAL HIGH (ref 70–99)
Glucose-Capillary: 94 mg/dL (ref 70–99)

## 2022-05-22 SURGERY — ARTERIOVENOUS (AV) FISTULA CREATION
Anesthesia: Monitor Anesthesia Care | Site: Arm Upper | Laterality: Left

## 2022-05-22 MED ORDER — LIDOCAINE-EPINEPHRINE (PF) 1 %-1:200000 IJ SOLN
INTRAMUSCULAR | Status: AC
  Filled 2022-05-22: qty 30

## 2022-05-22 MED ORDER — FENTANYL CITRATE (PF) 250 MCG/5ML IJ SOLN
INTRAMUSCULAR | Status: DC | PRN
Start: 2022-05-22 — End: 2022-05-22
  Administered 2022-05-22: 100 ug via INTRAVENOUS

## 2022-05-22 MED ORDER — CHLORHEXIDINE GLUCONATE 4 % EX LIQD: 60.0000 mL | Freq: Once | CUTANEOUS | Status: DC

## 2022-05-22 MED ORDER — CEFAZOLIN SODIUM-DEXTROSE 2-3 GM-%(50ML) IV SOLR
INTRAVENOUS | Status: DC | PRN
  Administered 2022-05-22: 2 g via INTRAVENOUS

## 2022-05-22 MED ORDER — INSULIN ASPART 100 UNIT/ML IJ SOLN: 0.0000 [IU] | INTRAMUSCULAR | Status: DC | PRN

## 2022-05-22 MED ORDER — MEPIVACAINE HCL (PF) 2 % IJ SOLN
INTRAMUSCULAR | Status: DC | PRN
  Administered 2022-05-22: 10 mL

## 2022-05-22 MED ORDER — ONDANSETRON HCL 4 MG/2ML IJ SOLN
INTRAMUSCULAR | Status: AC
  Filled 2022-05-22: qty 2

## 2022-05-22 MED ORDER — LACTATED RINGERS IV SOLN: INTRAVENOUS | Status: DC | PRN

## 2022-05-22 MED ORDER — LIDOCAINE 2% (20 MG/ML) 5 ML SYRINGE
INTRAMUSCULAR | Status: AC
  Filled 2022-05-22: qty 10

## 2022-05-22 MED ORDER — CEFAZOLIN SODIUM 1 G IJ SOLR
INTRAMUSCULAR | Status: AC
  Filled 2022-05-22: qty 20

## 2022-05-22 MED ORDER — OXYCODONE-ACETAMINOPHEN 5-325 MG PO TABS
1.0000 | ORAL_TABLET | Freq: Four times a day (QID) | ORAL | 0 refills | Status: DC | PRN
  Filled 2022-05-22: qty 12, 3d supply, fill #0

## 2022-05-22 MED ORDER — FENTANYL CITRATE (PF) 250 MCG/5ML IJ SOLN
INTRAMUSCULAR | Status: AC
  Filled 2022-05-22: qty 5

## 2022-05-22 MED ORDER — PROPOFOL 1000 MG/100ML IV EMUL
INTRAVENOUS | Status: AC
  Filled 2022-05-22: qty 100

## 2022-05-22 MED ORDER — SODIUM CHLORIDE 0.9 % IV SOLN: INTRAVENOUS | Status: DC

## 2022-05-22 MED ORDER — ROPIVACAINE HCL 5 MG/ML IJ SOLN
INTRAMUSCULAR | Status: DC | PRN
  Administered 2022-05-22: 20 mL via PERINEURAL

## 2022-05-22 MED ORDER — CHLORHEXIDINE GLUCONATE 0.12 % MT SOLN: 15.0000 mL | Freq: Once | OROMUCOSAL | Status: AC

## 2022-05-22 MED ORDER — PROPOFOL 500 MG/50ML IV EMUL
INTRAVENOUS | Status: DC | PRN
  Administered 2022-05-22: 75 ug/kg/min via INTRAVENOUS

## 2022-05-22 MED ORDER — CEFAZOLIN SODIUM 1 G IJ SOLR
INTRAMUSCULAR | Status: AC
Start: 2022-05-22 — End: ?
  Filled 2022-05-22: qty 20

## 2022-05-22 MED ORDER — MIDAZOLAM HCL 2 MG/2ML IJ SOLN
INTRAMUSCULAR | Status: AC
  Filled 2022-05-22: qty 2

## 2022-05-22 MED ORDER — HEPARIN 6000 UNIT IRRIGATION SOLUTION
Status: DC | PRN
  Administered 2022-05-22: 1

## 2022-05-22 MED ORDER — ROCURONIUM BROMIDE 10 MG/ML (PF) SYRINGE
PREFILLED_SYRINGE | INTRAVENOUS | Status: AC
  Filled 2022-05-22: qty 30

## 2022-05-22 MED ORDER — ORAL CARE MOUTH RINSE: 15.0000 mL | Freq: Once | OROMUCOSAL | Status: AC

## 2022-05-22 MED ORDER — ONDANSETRON HCL 4 MG/2ML IJ SOLN
INTRAMUSCULAR | Status: DC | PRN
  Administered 2022-05-22: 4 mg via INTRAVENOUS

## 2022-05-22 MED ORDER — PROPOFOL 10 MG/ML IV BOLUS
INTRAVENOUS | Status: DC | PRN
  Administered 2022-05-22: 30 mg via INTRAVENOUS

## 2022-05-22 MED ORDER — HEPARIN 6000 UNIT IRRIGATION SOLUTION
Status: AC
  Filled 2022-05-22: qty 500

## 2022-05-22 MED ORDER — CHLORHEXIDINE GLUCONATE 0.12 % MT SOLN
OROMUCOSAL | Status: AC
  Administered 2022-05-22: 15 mL via OROMUCOSAL
  Filled 2022-05-22: qty 15

## 2022-05-22 MED ORDER — MIDAZOLAM HCL 2 MG/2ML IJ SOLN
INTRAMUSCULAR | Status: DC | PRN
  Administered 2022-05-22 (×2): 2 mg via INTRAVENOUS

## 2022-05-22 MED ORDER — PROTAMINE SULFATE 10 MG/ML IV SOLN
INTRAVENOUS | Status: AC
  Filled 2022-05-22: qty 10

## 2022-05-22 MED ORDER — SUCCINYLCHOLINE CHLORIDE 200 MG/10ML IV SOSY
PREFILLED_SYRINGE | INTRAVENOUS | Status: AC
  Filled 2022-05-22: qty 20

## 2022-05-22 MED ORDER — 0.9 % SODIUM CHLORIDE (POUR BTL) OPTIME
TOPICAL | Status: DC | PRN
  Administered 2022-05-22: 1000 mL

## 2022-05-22 SURGICAL SUPPLY — 36 items
ARMBAND PINK RESTRICT EXTREMIT (MISCELLANEOUS) ×2 IMPLANT
BENZOIN TINCTURE PRP APPL 2/3 (GAUZE/BANDAGES/DRESSINGS) ×2 IMPLANT
CANISTER SUCT 3000ML PPV (MISCELLANEOUS) ×2 IMPLANT
CANNULA VESSEL 3MM 2 BLNT TIP (CANNULA) ×2 IMPLANT
CHLORAPREP W/TINT 26 (MISCELLANEOUS) ×2 IMPLANT
CLIP LIGATING EXTRA MED SLVR (CLIP) ×2 IMPLANT
CLIP LIGATING EXTRA SM BLUE (MISCELLANEOUS) ×2 IMPLANT
COVER PROBE W GEL 5X96 (DRAPES) IMPLANT
DRSG COVADERM 4X6 (GAUZE/BANDAGES/DRESSINGS) ×1 IMPLANT
ELECT REM PT RETURN 9FT ADLT (ELECTROSURGICAL) ×2
ELECTRODE REM PT RTRN 9FT ADLT (ELECTROSURGICAL) ×1 IMPLANT
GAUZE SPONGE 4X4 12PLY STRL (GAUZE/BANDAGES/DRESSINGS) ×1 IMPLANT
GLOVE BIO SURGEON STRL SZ8 (GLOVE) ×2 IMPLANT
GOWN STRL REUS W/ TWL LRG LVL3 (GOWN DISPOSABLE) ×2 IMPLANT
GOWN STRL REUS W/ TWL XL LVL3 (GOWN DISPOSABLE) ×1 IMPLANT
GOWN STRL REUS W/TWL LRG LVL3 (GOWN DISPOSABLE) ×2
GOWN STRL REUS W/TWL XL LVL3 (GOWN DISPOSABLE) ×1
INSERT FOGARTY SM (MISCELLANEOUS) IMPLANT
KIT BASIN OR (CUSTOM PROCEDURE TRAY) ×2 IMPLANT
KIT TURNOVER KIT B (KITS) ×2 IMPLANT
NDL 18GX1X1/2 (RX/OR ONLY) (NEEDLE) IMPLANT
NEEDLE 18GX1X1/2 (RX/OR ONLY) (NEEDLE) IMPLANT
NS IRRIG 1000ML POUR BTL (IV SOLUTION) ×2 IMPLANT
PACK CV ACCESS (CUSTOM PROCEDURE TRAY) ×2 IMPLANT
PAD ARMBOARD 7.5X6 YLW CONV (MISCELLANEOUS) ×4 IMPLANT
SLING ARM FOAM STRAP LRG (SOFTGOODS) IMPLANT
SLING ARM FOAM STRAP MED (SOFTGOODS) IMPLANT
STRIP CLOSURE SKIN 1/2X4 (GAUZE/BANDAGES/DRESSINGS) ×2 IMPLANT
SUT MNCRL AB 4-0 PS2 18 (SUTURE) ×2 IMPLANT
SUT PROLENE 6 0 BV (SUTURE) ×2 IMPLANT
SUT VIC AB 3-0 SH 27 (SUTURE) ×1
SUT VIC AB 3-0 SH 27X BRD (SUTURE) ×1 IMPLANT
SYR 3ML LL SCALE MARK (SYRINGE) IMPLANT
TOWEL GREEN STERILE (TOWEL DISPOSABLE) ×2 IMPLANT
UNDERPAD 30X36 HEAVY ABSORB (UNDERPADS AND DIAPERS) ×2 IMPLANT
WATER STERILE IRR 1000ML POUR (IV SOLUTION) ×2 IMPLANT

## 2022-05-22 NOTE — Interval H&P Note (Signed)
History and Physical Interval Note:  05/22/2022 7:32 AM  Levi Hodges  has presented today for surgery, with the diagnosis of ESRD.  The various methods of treatment have been discussed with the patient and family. After consideration of risks, benefits and other options for treatment, the patient has consented to  Procedure(s): FIRSTSTAGE BASILIC ARTERIOVENOUS  FISTULA CREATION (Left) as a surgical intervention.  The patient's history has been reviewed, patient examined, no change in status, stable for surgery.  I have reviewed the patient's chart and labs.  Questions were answered to the patient's satisfaction.     Cherre Robins

## 2022-05-22 NOTE — Anesthesia Procedure Notes (Signed)
Anesthesia Regional Block: Supraclavicular block   Pre-Anesthetic Checklist: , timeout performed,  Correct Patient, Correct Site, Correct Laterality,  Correct Procedure, Correct Position, site marked,  Risks and benefits discussed,  Pre-op evaluation,  At surgeon's request and post-op pain management  Laterality: Left  Prep: Maximum Sterile Barrier Precautions used, chloraprep       Needles:  Injection technique: Single-shot  Needle Type: Echogenic Stimulator Needle     Needle Length: 5cm  Needle Gauge: 21     Additional Needles:   Procedures:,,,, ultrasound used (permanent image in chart),,    Narrative:  Start time: 05/22/2022 7:00 AM End time: 05/22/2022 7:04 AM Injection made incrementally with aspirations every 5 mL. Anesthesiologist: Freddrick March, MD

## 2022-05-22 NOTE — Discharge Instructions (Signed)
° °  [facility]  ° °Discharge Instructions ° °AV Fistula or Graft Surgery for Dialysis Access ° °Please refer to the following instructions for your post-procedure care. Your surgeon or physician assistant will discuss any changes with you. ° °Activity ° °You may drive the day following your surgery, if you are comfortable and no longer taking prescription pain medication. Resume full activity as the soreness in your incision resolves. ° °Bathing/Showering ° °You may shower after you go home. Keep your incision dry for 48 hours. Do not soak in a bathtub, hot tub, or swim until the incision heals completely. You may not shower if you have a hemodialysis catheter. ° °Incision Care ° °Clean your incision with mild soap and water after 48 hours. Pat the area dry with a clean towel. You do not need a bandage unless otherwise instructed. Do not apply any ointments or creams to your incision. You may have skin glue on your incision. Do not peel it off. It will come off on its own in about one week. Your arm may swell a bit after surgery. To reduce swelling use pillows to elevate your arm so it is above your heart. Your doctor will tell you if you need to lightly wrap your arm with an ACE bandage. ° °Diet ° °Resume your normal diet. There are not special food restrictions following this procedure. In order to heal from your surgery, it is CRITICAL to get adequate nutrition. Your body requires vitamins, minerals, and protein. Vegetables are the best source of vitamins and minerals. Vegetables also provide the perfect balance of protein. Processed food has little nutritional value, so try to avoid this. ° °Medications ° °Resume taking all of your medications. If your incision is causing pain, you may take over-the counter pain relievers such as acetaminophen (Tylenol). If you were prescribed a stronger pain medication, please be aware these medications can cause nausea and constipation. Prevent  nausea by taking the medication with a snack or meal. Avoid constipation by drinking plenty of fluids and eating foods with high amount of fiber, such as fruits, vegetables, and grains. Do not take Tylenol if you are taking prescription pain medications. ° ° ° ° °Follow up °Your surgeon may want to see you in the office following your access surgery. If so, this will be arranged at the time of your surgery. ° °Please call us immediately for any of the following conditions: ° °Increased pain, redness, drainage (pus) from your incision site °Fever of 101 degrees or higher °Severe or worsening pain at your incision site °Hand pain or numbness. ° °Reduce your risk of vascular disease: ° °Stop smoking. If you would like help, call [facility] at 1-800-QUIT-NOW (1-800-784-8669) or [facility] at 336-586-4000 ° °Manage your cholesterol °Maintain a desired weight °Control your diabetes °Keep your blood pressure down ° °Dialysis ° °It will take several weeks to several months for your new dialysis access to be ready for use. Your surgeon will determine when it is OK to use it. Your nephrologist will continue to direct your dialysis. You can continue to use your Permcath until your new access is ready for use. ° °If you have any questions, please call the office at [phone]. ° °

## 2022-05-22 NOTE — Op Note (Signed)
DATE OF SERVICE: 05/22/2022  PATIENT:  Levi Hodges  46 y.o. male  PRE-OPERATIVE DIAGNOSIS:  ESRD  POST-OPERATIVE DIAGNOSIS:  Same  PROCEDURE:   Left brachiobasilic arteriovenous fistula creation  SURGEON:  Surgeon(s) and Role:    * Cherre Robins, MD - Primary  ASSISTANT: Gerri Lins, PA-C  An experienced assistant was required given the complexity of this procedure and the standard of surgical care. My assistant helped with exposure through counter tension, suctioning, ligation and retraction to better visualize the surgical field.  My assistant expedited sewing during the case by following my sutures. Wherever I use the term "we" in the report, my assistant actively helped me with that portion of the procedure.  ANESTHESIA:   regional and MAC  EBL: minimal  BLOOD ADMINISTERED:none  DRAINS: none   LOCAL MEDICATIONS USED:  NONE  SPECIMEN:  none  COUNTS: confirmed correct.  TOURNIQUET:  none  PATIENT DISPOSITION:  PACU - hemodynamically stable.   Delay start of Pharmacological VTE agent (>24hrs) due to surgical blood loss or risk of bleeding: no  INDICATION FOR PROCEDURE: Levi Hodges is a 46 y.o. male with ESRD in need of permanent dialysis access. After careful discussion of risks, benefits, and alternatives the patient was offered left brachiobasilic arteriovenous fistula creation. The patient understood and wished to proceed.  OPERATIVE FINDINGS: healthy basilic vein and brachial artery. Good doppler flow in fistula and radial artery at completion.  DESCRIPTION OF PROCEDURE: After identification of the patient in the pre-operative holding area, the patient was transferred to the operating room. The patient was positioned supine on the operating room table. Anesthesia was induced. The left arm was prepped and draped in standard fashion. A surgical pause was performed confirming correct patient, procedure, and operative location.  Using intraoperative  ultrasound the left brachial artery and basilic vein were mapped.  A curvilinear incision was planned over the course of the two vessels to allow fistula creation.  Incision was created.  Incision was carried down through subcutaneous tissue.  The aponeurosis of the biceps tendon was divided.  The brachial sheath was identified.  The brachial artery was skeletonized.  The artery was encircled with 2 Silastic Vesseloops.  Next attention was turned to the basilic vein.  This was identified in the medial arm in its typical position.  The vein was mobilized throughout the length of the incision to allow tension-free arteriovenous fistula creation.  The distal end of the vein was clamped with a right angle.  The proximal end of the vein was clamped with a bulldog.  The vein was transected distally.  The stump was oversewn with a 2-0 silk.  The cut end of the vein was spatulated and distended with a mosquito clamp.  Patient was systemically heparinized with 3000 units of IV heparin.  After a three minute pause, the brachial artery was clamped proximally distally.  The basilic vein was anastomosed to the brachial artery into side using continuous running suture of 6-0 Prolene.  Immediately prior to completion the anastomosis was flushed and de-aired.  The anastomosis was completed.  Clamps were released.  Hemostasis was achieved.  An audible bruit was heard in the fistula.  Doppler flow was noted in the left radial artery. Hemostasis was achieved in the surgical bed.  The wound was closed with 3-0 Vicryl and 4-0 Monocryl.   Upon completion of the case instrument and sharps counts were confirmed correct. The patient was transferred to the PACU in good condition. I was present for  all portions of the procedure.  [name]. Stanford Breed, MD [facility] Phone Number: [phone] 05/22/2022 8:55 AM

## 2022-05-22 NOTE — Progress Notes (Signed)
Orthopedic Tech Progress Note Patient Details:  Levi Hodges 10/06/1976 [id number]  Ortho Devices Type of Ortho Device: Arm sling Ortho Device/Splint Location: at bedside, for LUE Ortho Device/Splint Interventions: Ordered   Post Interventions Instructions Provided: Adjustment of device, Care of device  Teyanna Thielman Jeri Modena 05/22/2022, 9:15 AM

## 2022-05-22 NOTE — Transfer of Care (Signed)
Immediate Anesthesia Transfer of Care Note  Patient: Levi Hodges  Procedure(s) Performed: FIRSTSTAGE BASILIC ARTERIOVENOUS  FISTULA CREATION (Left: Arm Upper)  Patient Location: PACU  Anesthesia Type:General  Level of Consciousness: awake, alert  and oriented  Airway & Oxygen Therapy: Patient connected to face mask oxygen  Post-op Assessment: Post -op Vital signs reviewed and stable  Post vital signs: stable  Last Vitals:  Vitals Value Taken Time  BP 172/85 05/22/22 0837  Temp    Pulse 56 05/22/22 0837  Resp 15 05/22/22 0837  SpO2 100 % 05/22/22 0837  Vitals shown include unvalidated device data.  Last Pain:  Vitals:   05/22/22 0612  TempSrc: Oral  PainSc: 0-No pain      Patients Stated Pain Goal: 2 (37/79/39 [id number])  Complications: No notable events documented.

## 2022-05-23 ENCOUNTER — Encounter (HOSPITAL_COMMUNITY): Payer: Self-pay | Admitting: Vascular Surgery

## 2022-05-23 DIAGNOSIS — Z992 Dependence on renal dialysis: Secondary | ICD-10-CM | POA: Diagnosis not present

## 2022-05-23 DIAGNOSIS — N2581 Secondary hyperparathyroidism of renal origin: Secondary | ICD-10-CM | POA: Diagnosis not present

## 2022-05-23 DIAGNOSIS — N186 End stage renal disease: Secondary | ICD-10-CM | POA: Diagnosis not present

## 2022-05-23 NOTE — Anesthesia Postprocedure Evaluation (Signed)
Anesthesia Post Note  Patient: Toure Edmonds  Procedure(s) Performed: FIRSTSTAGE BASILIC ARTERIOVENOUS  FISTULA CREATION (Left: Arm Upper)     Patient location during evaluation: PACU Anesthesia Type: Regional and MAC Level of consciousness: awake and alert Pain management: pain level controlled Vital Signs Assessment: post-procedure vital signs reviewed and stable Respiratory status: spontaneous breathing, nonlabored ventilation, respiratory function stable and patient connected to nasal cannula oxygen Cardiovascular status: stable and blood pressure returned to baseline Postop Assessment: no apparent nausea or vomiting Anesthetic complications: no   No notable events documented.  Last Vitals:  Vitals:   05/22/22 0945 05/22/22 1000  BP: (!) 188/95 (!) 176/97  Pulse: (!) 57 60  Resp: 13 13  Temp:  36.5 C  SpO2: 97% 96%    Last Pain:  Vitals:   05/22/22 1000  TempSrc:   PainSc: 0-No pain                 Ronney Honeywell L Wisdom Rickey

## 2022-05-26 DIAGNOSIS — N2581 Secondary hyperparathyroidism of renal origin: Secondary | ICD-10-CM | POA: Diagnosis not present

## 2022-05-26 DIAGNOSIS — N186 End stage renal disease: Secondary | ICD-10-CM | POA: Diagnosis not present

## 2022-05-26 DIAGNOSIS — Z992 Dependence on renal dialysis: Secondary | ICD-10-CM | POA: Diagnosis not present

## 2022-05-27 ENCOUNTER — Telehealth: Payer: Self-pay | Admitting: Internal Medicine

## 2022-05-27 NOTE — Telephone Encounter (Signed)
Patient needs to call his dialysis doctor as they may have samples or can prescribe an alternative covered by his insurance. Ask at dialysis

## 2022-05-27 NOTE — Telephone Encounter (Signed)
Patient has been out of his sevelenar for 3 days - they are still waiting on prior authorization to be approved. - Please advise.  [facility] on [address].

## 2022-05-27 NOTE — Telephone Encounter (Signed)
Spoke with the pt and he stated that he would contact is dialysis doctor in regards to samples and see what they say before we prescribe something different

## 2022-05-28 DIAGNOSIS — N2581 Secondary hyperparathyroidism of renal origin: Secondary | ICD-10-CM | POA: Diagnosis not present

## 2022-05-28 DIAGNOSIS — N186 End stage renal disease: Secondary | ICD-10-CM | POA: Diagnosis not present

## 2022-05-28 DIAGNOSIS — Z992 Dependence on renal dialysis: Secondary | ICD-10-CM | POA: Diagnosis not present

## 2022-05-30 DIAGNOSIS — N186 End stage renal disease: Secondary | ICD-10-CM | POA: Diagnosis not present

## 2022-05-30 DIAGNOSIS — Z992 Dependence on renal dialysis: Secondary | ICD-10-CM | POA: Diagnosis not present

## 2022-05-30 DIAGNOSIS — N2581 Secondary hyperparathyroidism of renal origin: Secondary | ICD-10-CM | POA: Diagnosis not present

## 2022-06-02 DIAGNOSIS — Z992 Dependence on renal dialysis: Secondary | ICD-10-CM | POA: Diagnosis not present

## 2022-06-02 DIAGNOSIS — N2581 Secondary hyperparathyroidism of renal origin: Secondary | ICD-10-CM | POA: Diagnosis not present

## 2022-06-02 DIAGNOSIS — N186 End stage renal disease: Secondary | ICD-10-CM | POA: Diagnosis not present

## 2022-06-04 DIAGNOSIS — N186 End stage renal disease: Secondary | ICD-10-CM | POA: Diagnosis not present

## 2022-06-04 DIAGNOSIS — Z992 Dependence on renal dialysis: Secondary | ICD-10-CM | POA: Diagnosis not present

## 2022-06-04 DIAGNOSIS — N2581 Secondary hyperparathyroidism of renal origin: Secondary | ICD-10-CM | POA: Diagnosis not present

## 2022-06-06 DIAGNOSIS — N2581 Secondary hyperparathyroidism of renal origin: Secondary | ICD-10-CM | POA: Diagnosis not present

## 2022-06-06 DIAGNOSIS — Z992 Dependence on renal dialysis: Secondary | ICD-10-CM | POA: Diagnosis not present

## 2022-06-06 DIAGNOSIS — N186 End stage renal disease: Secondary | ICD-10-CM | POA: Diagnosis not present

## 2022-06-09 DIAGNOSIS — N2581 Secondary hyperparathyroidism of renal origin: Secondary | ICD-10-CM | POA: Diagnosis not present

## 2022-06-09 DIAGNOSIS — Z992 Dependence on renal dialysis: Secondary | ICD-10-CM | POA: Diagnosis not present

## 2022-06-09 DIAGNOSIS — N186 End stage renal disease: Secondary | ICD-10-CM | POA: Diagnosis not present

## 2022-06-10 DIAGNOSIS — E113393 Type 2 diabetes mellitus with moderate nonproliferative diabetic retinopathy without macular edema, bilateral: Secondary | ICD-10-CM | POA: Diagnosis not present

## 2022-06-10 DIAGNOSIS — I10 Essential (primary) hypertension: Secondary | ICD-10-CM | POA: Diagnosis not present

## 2022-06-10 LAB — HM DIABETES EYE EXAM

## 2022-06-11 DIAGNOSIS — Z992 Dependence on renal dialysis: Secondary | ICD-10-CM | POA: Diagnosis not present

## 2022-06-11 DIAGNOSIS — N2581 Secondary hyperparathyroidism of renal origin: Secondary | ICD-10-CM | POA: Diagnosis not present

## 2022-06-11 DIAGNOSIS — N186 End stage renal disease: Secondary | ICD-10-CM | POA: Diagnosis not present

## 2022-06-13 DIAGNOSIS — N2581 Secondary hyperparathyroidism of renal origin: Secondary | ICD-10-CM | POA: Diagnosis not present

## 2022-06-13 DIAGNOSIS — N186 End stage renal disease: Secondary | ICD-10-CM | POA: Diagnosis not present

## 2022-06-13 DIAGNOSIS — Z992 Dependence on renal dialysis: Secondary | ICD-10-CM | POA: Diagnosis not present

## 2022-06-16 DIAGNOSIS — N2581 Secondary hyperparathyroidism of renal origin: Secondary | ICD-10-CM | POA: Diagnosis not present

## 2022-06-16 DIAGNOSIS — Z992 Dependence on renal dialysis: Secondary | ICD-10-CM | POA: Diagnosis not present

## 2022-06-16 DIAGNOSIS — N186 End stage renal disease: Secondary | ICD-10-CM | POA: Diagnosis not present

## 2022-06-18 ENCOUNTER — Encounter: Payer: Self-pay | Admitting: Internal Medicine

## 2022-06-18 DIAGNOSIS — N186 End stage renal disease: Secondary | ICD-10-CM | POA: Diagnosis not present

## 2022-06-18 DIAGNOSIS — N2581 Secondary hyperparathyroidism of renal origin: Secondary | ICD-10-CM | POA: Diagnosis not present

## 2022-06-18 DIAGNOSIS — Z992 Dependence on renal dialysis: Secondary | ICD-10-CM | POA: Diagnosis not present

## 2022-06-18 NOTE — Progress Notes (Signed)
Outside notes received. Information abstracted. Notes sent to scan.  

## 2022-06-20 DIAGNOSIS — Z992 Dependence on renal dialysis: Secondary | ICD-10-CM | POA: Diagnosis not present

## 2022-06-20 DIAGNOSIS — N186 End stage renal disease: Secondary | ICD-10-CM | POA: Diagnosis not present

## 2022-06-20 DIAGNOSIS — N2581 Secondary hyperparathyroidism of renal origin: Secondary | ICD-10-CM | POA: Diagnosis not present

## 2022-06-23 DIAGNOSIS — N186 End stage renal disease: Secondary | ICD-10-CM | POA: Diagnosis not present

## 2022-06-23 DIAGNOSIS — Z992 Dependence on renal dialysis: Secondary | ICD-10-CM | POA: Diagnosis not present

## 2022-06-23 DIAGNOSIS — N2581 Secondary hyperparathyroidism of renal origin: Secondary | ICD-10-CM | POA: Diagnosis not present

## 2022-06-25 DIAGNOSIS — N186 End stage renal disease: Secondary | ICD-10-CM | POA: Diagnosis not present

## 2022-06-25 DIAGNOSIS — Z992 Dependence on renal dialysis: Secondary | ICD-10-CM | POA: Diagnosis not present

## 2022-06-25 DIAGNOSIS — N2581 Secondary hyperparathyroidism of renal origin: Secondary | ICD-10-CM | POA: Diagnosis not present

## 2022-06-26 ENCOUNTER — Other Ambulatory Visit: Payer: Self-pay | Admitting: *Deleted

## 2022-06-26 DIAGNOSIS — N186 End stage renal disease: Secondary | ICD-10-CM

## 2022-06-27 DIAGNOSIS — N186 End stage renal disease: Secondary | ICD-10-CM | POA: Diagnosis not present

## 2022-06-27 DIAGNOSIS — Z992 Dependence on renal dialysis: Secondary | ICD-10-CM | POA: Diagnosis not present

## 2022-06-27 DIAGNOSIS — N2581 Secondary hyperparathyroidism of renal origin: Secondary | ICD-10-CM | POA: Diagnosis not present

## 2022-06-29 ENCOUNTER — Other Ambulatory Visit: Payer: Self-pay | Admitting: Internal Medicine

## 2022-06-30 DIAGNOSIS — N186 End stage renal disease: Secondary | ICD-10-CM | POA: Diagnosis not present

## 2022-06-30 DIAGNOSIS — N2581 Secondary hyperparathyroidism of renal origin: Secondary | ICD-10-CM | POA: Diagnosis not present

## 2022-06-30 DIAGNOSIS — Z992 Dependence on renal dialysis: Secondary | ICD-10-CM | POA: Diagnosis not present

## 2022-07-02 DIAGNOSIS — Z992 Dependence on renal dialysis: Secondary | ICD-10-CM | POA: Diagnosis not present

## 2022-07-02 DIAGNOSIS — N186 End stage renal disease: Secondary | ICD-10-CM | POA: Diagnosis not present

## 2022-07-02 DIAGNOSIS — N2581 Secondary hyperparathyroidism of renal origin: Secondary | ICD-10-CM | POA: Diagnosis not present

## 2022-07-03 ENCOUNTER — Encounter: Payer: Self-pay | Admitting: Internal Medicine

## 2022-07-03 ENCOUNTER — Telehealth (INDEPENDENT_AMBULATORY_CARE_PROVIDER_SITE_OTHER): Payer: Medicare Other | Admitting: Internal Medicine

## 2022-07-03 DIAGNOSIS — N186 End stage renal disease: Secondary | ICD-10-CM

## 2022-07-03 DIAGNOSIS — E119 Type 2 diabetes mellitus without complications: Secondary | ICD-10-CM

## 2022-07-03 DIAGNOSIS — E1122 Type 2 diabetes mellitus with diabetic chronic kidney disease: Secondary | ICD-10-CM | POA: Diagnosis not present

## 2022-07-03 DIAGNOSIS — Z992 Dependence on renal dialysis: Secondary | ICD-10-CM | POA: Diagnosis not present

## 2022-07-03 MED ORDER — BLOOD GLUCOSE METER KIT: PACK | 0 refills | Status: DC

## 2022-07-03 NOTE — Progress Notes (Signed)
Virtual Visit via Video Note  I connected with Levi Hodges on 07/03/22 at 10:40 AM EDT by a video enabled telemedicine application and verified that I am speaking with the correct person using two identifiers.  The patient and the provider were at separate locations throughout the entire encounter. Patient location: home, Provider location: work   I discussed the limitations of evaluation and management by telemedicine and the availability of in person appointments. The patient expressed understanding and agreed to proceed. The patient and the provider were the only parties present for the visit unless noted in HPI below.  History of Present Illness: The patient is a 46 y.o. man with visit for needing dental work. Still having some low BP on dialysis days does not ake BP meds those days.   Observations/Objective: Appearance: normal, breathing appears normal, casual grooming  Assessment and Plan: See problem oriented charting  Follow Up Instructions: Ordered blood glucose meter suspect he is mostly dialysis controlled diabetes.    I discussed the assessment and treatment plan with the patient. The patient was provided an opportunity to ask questions and all were answered. The patient agreed with the plan and demonstrated an understanding of the instructions.   The patient was advised to call back or seek an in-person evaluation if the symptoms worsen or if the condition fails to improve as anticipated.  Levi Koch, MD

## 2022-07-04 ENCOUNTER — Encounter: Payer: Self-pay | Admitting: Internal Medicine

## 2022-07-04 DIAGNOSIS — Z992 Dependence on renal dialysis: Secondary | ICD-10-CM | POA: Diagnosis not present

## 2022-07-04 DIAGNOSIS — D631 Anemia in chronic kidney disease: Secondary | ICD-10-CM | POA: Diagnosis not present

## 2022-07-04 DIAGNOSIS — R52 Pain, unspecified: Secondary | ICD-10-CM | POA: Diagnosis not present

## 2022-07-04 DIAGNOSIS — N2581 Secondary hyperparathyroidism of renal origin: Secondary | ICD-10-CM | POA: Diagnosis not present

## 2022-07-04 DIAGNOSIS — D509 Iron deficiency anemia, unspecified: Secondary | ICD-10-CM | POA: Diagnosis not present

## 2022-07-04 DIAGNOSIS — D689 Coagulation defect, unspecified: Secondary | ICD-10-CM | POA: Diagnosis not present

## 2022-07-04 DIAGNOSIS — N186 End stage renal disease: Secondary | ICD-10-CM | POA: Diagnosis not present

## 2022-07-04 MED ORDER — BLOOD GLUCOSE METER KIT: PACK | 0 refills | Status: DC

## 2022-07-04 MED ORDER — CARVEDILOL 6.25 MG PO TABS: 6.2500 mg | ORAL_TABLET | Freq: Two times a day (BID) | ORAL | 3 refills | Status: DC

## 2022-07-04 NOTE — Assessment & Plan Note (Signed)
Hold BP meds on dialysis days. Tolerating well some lightheadedness still.

## 2022-07-04 NOTE — Progress Notes (Unsigned)
POST OPERATIVE OFFICE NOTE    CC:  F/u for surgery  HPI:  This is a 46 y.o. male who is s/p left BC AVF and TDC placement on 03/26/2022 by Dr. Stanford Breed.  This thrombosed and pt subsequently underwent left 1st stage BVT on 05/22/2022 also by Dr. Stanford Breed.  Pt returns today for follow up.    Pt states he does not have pain/numbness in the left hand.    The pt is on dialysis at the [facility] location M/W/F.  His wife states that he is working on having teeth extraction due to infection and cysts and bleeding present.   This has not been scheduled.   He is working to get on the transplant list.     No Known Allergies  Current Outpatient Medications  Medication Sig Dispense Refill   acetaminophen (TYLENOL) 325 MG tablet Take 650 mg by mouth every 6 (six) hours as needed for headache.     amLODipine (NORVASC) 10 MG tablet Take 1 tablet (10 mg total) by mouth daily. 90 tablet 3   blood glucose meter kit and supplies Dispense based on patient and insurance preference. Use up to four times daily as directed. (FOR ICD-10 E10.9, E11.9). 1 each 0   carvedilol (COREG) 6.25 MG tablet Take 1-2 tablets (6.25-12.5 mg total) by mouth 2 (two) times daily with a meal. (Patient taking differently: Take 6.25 mg by mouth 2 (two) times daily with a meal.) 270 tablet 0   gabapentin (NEURONTIN) 100 MG capsule Take 100 mg by mouth 2 (two) times daily.     glucose blood (ONETOUCH VERIO) test strip 4x daily 150 each 12   Insulin Pen Needle (PEN NEEDLES) 31G X 5 MM MISC 1 Package by Does not apply route daily. (Patient not taking: Reported on 04/03/2022) 100 each 3   oxyCODONE-acetaminophen (PERCOCET/ROXICET) 5-325 MG tablet Take 1 tablet by mouth every 6 (six) hours as needed. 12 tablet 0   sevelamer carbonate (RENVELA) 800 MG tablet Take 1 tablet (800 mg total) by mouth 3 (three) times daily with meals. 90 tablet 11   simvastatin (ZOCOR) 20 MG tablet TAKE 1 TABLET(20 MG) BY MOUTH AT BEDTIME 90 tablet 3   No current  facility-administered medications for this visit.     ROS:  See HPI  Physical Exam:  Today's Vitals   07/08/22 0919  BP: (!) 162/91  Pulse: 72  Resp: 20  Temp: 98.2 F (36.8 C)  TempSrc: Temporal  SpO2: 99%  Weight: 148 lb 9.6 oz (67.4 kg)  Height: _0  (1.803 m)   Body mass index is 20.73 kg/m.   Incision:  healed Extremities:   There is a palpable left radial pulse.   Motor and sensory are in tact.   There is a thrill present.  Access is is easily palpable   Dialysis Duplex on 07/08/2022: Findings:  +--------------------+----------+-----------------+--------+  AVF                 PSV (cm/s)Flow Vol (mL/min)Comments  +--------------------+----------+-----------------+--------+  Native artery inflow   145           693                 +--------------------+----------+-----------------+--------+  AVF Anastomosis        614                               +--------------------+----------+-----------------+--------+   +------------+----------+-------------+----------+--------+  OUTFLOW VEINPSV (  cm/s)Diameter (cm)Depth (cm)Describe  +------------+----------+-------------+----------+--------+  Prox UA         60        0.61        0.64             +------------+----------+-------------+----------+--------+  Mid UA         109        0.65        0.60             +------------+----------+-------------+----------+--------+  Dist UA        360        0.56        0.32             +------------+----------+-------------+----------+--------+  AC Fossa       581        0.22                         +------------+----------+-------------+----------+--------+  Distal radial artery 15 cm/s pre occlusive. Distal ulnar artery 84 cm/s  Triphasic waveform.   Summary:  Patent BVT.  [place] vein small at the anastomosis and in the ante cubitum.  The distal radial artery waveform is pre occlusive.   Assessment/Plan:  This is a 46 y.o. male  who is s/p: left BC AVF and TDC placement on 03/26/2022 by Dr. Stanford Breed.  This thrombosed and pt subsequently underwent left 1st stage BVT on 05/22/2022 also by Dr. Stanford Breed.  Pt returns today for follow up.     -the pt does not have evidence of steal. -pt's left 1st stage BVT is maturing nicely.  Discussed results with Dr. Stanford Breed given above findings.  Pt has palpable left radial pulse.  He states it is ok to proceed with 2nd stage BVT.   -he will undergo his teeth extraction first as this is priority.  He and his wife are given card with scheduler name and extension and they will call to schedule once he has recovered from his surgery.   -if pt has tunneled catheter, this can be removed at the discretion of the dialysis center once the pt's access has been successfully cannulated to their satisfaction.  -discussed with pt that access does not last forever and will need intervention or even new access at some point.   They both expressed understanding.    [name], [facility] [phone]  Clinic MD:  Stanford Breed

## 2022-07-04 NOTE — Assessment & Plan Note (Signed)
Likely controlled by dialysis currently and sent in glucose meter so he can monitor random morning and after meal sugars to monitor control.

## 2022-07-07 DIAGNOSIS — N2581 Secondary hyperparathyroidism of renal origin: Secondary | ICD-10-CM | POA: Diagnosis not present

## 2022-07-07 DIAGNOSIS — Z992 Dependence on renal dialysis: Secondary | ICD-10-CM | POA: Diagnosis not present

## 2022-07-07 DIAGNOSIS — R52 Pain, unspecified: Secondary | ICD-10-CM | POA: Diagnosis not present

## 2022-07-07 DIAGNOSIS — D509 Iron deficiency anemia, unspecified: Secondary | ICD-10-CM | POA: Diagnosis not present

## 2022-07-07 DIAGNOSIS — N186 End stage renal disease: Secondary | ICD-10-CM | POA: Diagnosis not present

## 2022-07-07 DIAGNOSIS — D689 Coagulation defect, unspecified: Secondary | ICD-10-CM | POA: Diagnosis not present

## 2022-07-07 DIAGNOSIS — D631 Anemia in chronic kidney disease: Secondary | ICD-10-CM | POA: Diagnosis not present

## 2022-07-08 ENCOUNTER — Ambulatory Visit (HOSPITAL_COMMUNITY)
Admission: RE | Admit: 2022-07-08 | Discharge: 2022-07-08 | Disposition: A | Discharge status: Home / Self Care | Payer: Medicare Other | Source: Ambulatory Visit | Attending: Vascular Surgery | Admitting: Vascular Surgery

## 2022-07-08 ENCOUNTER — Ambulatory Visit (INDEPENDENT_AMBULATORY_CARE_PROVIDER_SITE_OTHER): Payer: Medicare Other | Admitting: Physician Assistant

## 2022-07-08 VITALS — BP 162/91 | HR 72 | Temp 98.2°F | Resp 20 | Ht 71.0 in | Wt 148.6 lb

## 2022-07-08 DIAGNOSIS — N186 End stage renal disease: Secondary | ICD-10-CM

## 2022-07-08 NOTE — Telephone Encounter (Signed)
The original prescription was reordered on 07/04/2022 by Hoyt Koch, MD.../lmb

## 2022-07-09 DIAGNOSIS — Z992 Dependence on renal dialysis: Secondary | ICD-10-CM | POA: Diagnosis not present

## 2022-07-09 DIAGNOSIS — D509 Iron deficiency anemia, unspecified: Secondary | ICD-10-CM | POA: Diagnosis not present

## 2022-07-09 DIAGNOSIS — R52 Pain, unspecified: Secondary | ICD-10-CM | POA: Diagnosis not present

## 2022-07-09 DIAGNOSIS — N186 End stage renal disease: Secondary | ICD-10-CM | POA: Diagnosis not present

## 2022-07-09 DIAGNOSIS — N2581 Secondary hyperparathyroidism of renal origin: Secondary | ICD-10-CM | POA: Diagnosis not present

## 2022-07-09 DIAGNOSIS — D631 Anemia in chronic kidney disease: Secondary | ICD-10-CM | POA: Diagnosis not present

## 2022-07-09 DIAGNOSIS — D689 Coagulation defect, unspecified: Secondary | ICD-10-CM | POA: Diagnosis not present

## 2022-07-10 ENCOUNTER — Encounter: Payer: Self-pay | Admitting: Podiatry

## 2022-07-10 ENCOUNTER — Ambulatory Visit (INDEPENDENT_AMBULATORY_CARE_PROVIDER_SITE_OTHER): Payer: Medicare Other | Admitting: Podiatry

## 2022-07-10 DIAGNOSIS — M79675 Pain in left toe(s): Secondary | ICD-10-CM

## 2022-07-10 DIAGNOSIS — B351 Tinea unguium: Secondary | ICD-10-CM | POA: Diagnosis not present

## 2022-07-10 DIAGNOSIS — M79674 Pain in right toe(s): Secondary | ICD-10-CM

## 2022-07-10 NOTE — Telephone Encounter (Signed)
Okay to send in meter. I had sent in the generic blood sugar monitor twice to his pharmacy not sure if they did not get it.

## 2022-07-10 NOTE — Progress Notes (Signed)
  Subjective:  Patient ID: Levi Hodges, male    DOB: 1976-08-06,  MRN: [id number]  Chief Complaint  Patient presents with   Diabetes    Diabetic foot exam  Nail trim    46 y.o. male returns for the above complaint.  Patient presents with thickened elongated dystrophic toenails x10 mild pain on palpation.  He would like to have them debrided down he is a diabetic with controlled A1c.  He has not seen anyone else prior to seeing me for this.  Denies any other acute complaints  Objective:  There were no vitals filed for this visit. Podiatric Exam: Vascular: dorsalis pedis and posterior tibial pulses are palpable bilateral. Capillary return is immediate. Temperature gradient is WNL. Skin turgor WNL  Sensorium: Normal Semmes Weinstein monofilament test. Normal tactile sensation bilaterally. Nail Exam: Pt has thick disfigured discolored nails with subungual debris noted bilateral entire nail hallux through fifth toenails.  Pain on palpation to the nails. Ulcer Exam: There is no evidence of ulcer or pre-ulcerative changes or infection. Orthopedic Exam: Muscle tone and strength are WNL. No limitations in general ROM. No crepitus or effusions noted.  Skin: No Porokeratosis. No infection or ulcers    Assessment & Plan:   1. Pain due to onychomycosis of toenails of both feet     Patient was evaluated and treated and all questions answered.  Onychomycosis with pain  -Nails palliatively debrided as below. -Educated on self-care  Procedure: Nail Debridement Rationale: pain  Type of Debridement: manual, sharp debridement. Instrumentation: Nail nipper, rotary burr. Number of Nails: 10  Procedures and Treatment: Consent by patient was obtained for treatment procedures. The patient understood the discussion of treatment and procedures well. All questions were answered thoroughly reviewed. Debridement of mycotic and hypertrophic toenails, 1 through 5 bilateral and clearing of subungual debris.  No ulceration, no infection noted.  Return Visit-Office Procedure: Patient instructed to return to the office for a follow up visit 3 months for continued evaluation and treatment.  Boneta Lucks, DPM    No follow-ups on file.

## 2022-07-11 ENCOUNTER — Other Ambulatory Visit: Payer: Self-pay

## 2022-07-11 DIAGNOSIS — Z992 Dependence on renal dialysis: Secondary | ICD-10-CM | POA: Diagnosis not present

## 2022-07-11 DIAGNOSIS — D509 Iron deficiency anemia, unspecified: Secondary | ICD-10-CM | POA: Diagnosis not present

## 2022-07-11 DIAGNOSIS — N186 End stage renal disease: Secondary | ICD-10-CM | POA: Diagnosis not present

## 2022-07-11 DIAGNOSIS — N2581 Secondary hyperparathyroidism of renal origin: Secondary | ICD-10-CM | POA: Diagnosis not present

## 2022-07-11 DIAGNOSIS — R52 Pain, unspecified: Secondary | ICD-10-CM | POA: Diagnosis not present

## 2022-07-11 DIAGNOSIS — D631 Anemia in chronic kidney disease: Secondary | ICD-10-CM | POA: Diagnosis not present

## 2022-07-11 DIAGNOSIS — D689 Coagulation defect, unspecified: Secondary | ICD-10-CM | POA: Diagnosis not present

## 2022-07-14 ENCOUNTER — Other Ambulatory Visit: Payer: Self-pay

## 2022-07-14 ENCOUNTER — Other Ambulatory Visit: Payer: Self-pay | Admitting: Internal Medicine

## 2022-07-14 DIAGNOSIS — D631 Anemia in chronic kidney disease: Secondary | ICD-10-CM | POA: Diagnosis not present

## 2022-07-14 DIAGNOSIS — N186 End stage renal disease: Secondary | ICD-10-CM | POA: Diagnosis not present

## 2022-07-14 DIAGNOSIS — D689 Coagulation defect, unspecified: Secondary | ICD-10-CM | POA: Diagnosis not present

## 2022-07-14 DIAGNOSIS — E119 Type 2 diabetes mellitus without complications: Secondary | ICD-10-CM

## 2022-07-14 DIAGNOSIS — D509 Iron deficiency anemia, unspecified: Secondary | ICD-10-CM | POA: Diagnosis not present

## 2022-07-14 DIAGNOSIS — R52 Pain, unspecified: Secondary | ICD-10-CM | POA: Diagnosis not present

## 2022-07-14 DIAGNOSIS — N2581 Secondary hyperparathyroidism of renal origin: Secondary | ICD-10-CM | POA: Diagnosis not present

## 2022-07-14 DIAGNOSIS — Z992 Dependence on renal dialysis: Secondary | ICD-10-CM | POA: Diagnosis not present

## 2022-07-14 MED ORDER — ONETOUCH VERIO W/DEVICE KIT: PACK | 0 refills | Status: DC

## 2022-07-14 MED ORDER — BLOOD GLUCOSE METER KIT: PACK | 0 refills | Status: DC

## 2022-07-16 DIAGNOSIS — Z992 Dependence on renal dialysis: Secondary | ICD-10-CM | POA: Diagnosis not present

## 2022-07-16 DIAGNOSIS — D689 Coagulation defect, unspecified: Secondary | ICD-10-CM | POA: Diagnosis not present

## 2022-07-16 DIAGNOSIS — D631 Anemia in chronic kidney disease: Secondary | ICD-10-CM | POA: Diagnosis not present

## 2022-07-16 DIAGNOSIS — R52 Pain, unspecified: Secondary | ICD-10-CM | POA: Diagnosis not present

## 2022-07-16 DIAGNOSIS — N2581 Secondary hyperparathyroidism of renal origin: Secondary | ICD-10-CM | POA: Diagnosis not present

## 2022-07-16 DIAGNOSIS — N186 End stage renal disease: Secondary | ICD-10-CM | POA: Diagnosis not present

## 2022-07-16 DIAGNOSIS — D509 Iron deficiency anemia, unspecified: Secondary | ICD-10-CM | POA: Diagnosis not present

## 2022-07-18 DIAGNOSIS — N2581 Secondary hyperparathyroidism of renal origin: Secondary | ICD-10-CM | POA: Diagnosis not present

## 2022-07-18 DIAGNOSIS — R52 Pain, unspecified: Secondary | ICD-10-CM | POA: Diagnosis not present

## 2022-07-18 DIAGNOSIS — Z992 Dependence on renal dialysis: Secondary | ICD-10-CM | POA: Diagnosis not present

## 2022-07-18 DIAGNOSIS — D631 Anemia in chronic kidney disease: Secondary | ICD-10-CM | POA: Diagnosis not present

## 2022-07-18 DIAGNOSIS — D509 Iron deficiency anemia, unspecified: Secondary | ICD-10-CM | POA: Diagnosis not present

## 2022-07-18 DIAGNOSIS — N186 End stage renal disease: Secondary | ICD-10-CM | POA: Diagnosis not present

## 2022-07-18 DIAGNOSIS — D689 Coagulation defect, unspecified: Secondary | ICD-10-CM | POA: Diagnosis not present

## 2022-07-21 ENCOUNTER — Other Ambulatory Visit: Payer: Self-pay

## 2022-07-21 ENCOUNTER — Encounter (HOSPITAL_COMMUNITY): Payer: Self-pay | Admitting: Oral Surgery

## 2022-07-21 DIAGNOSIS — Z992 Dependence on renal dialysis: Secondary | ICD-10-CM | POA: Diagnosis not present

## 2022-07-21 DIAGNOSIS — D631 Anemia in chronic kidney disease: Secondary | ICD-10-CM | POA: Diagnosis not present

## 2022-07-21 DIAGNOSIS — D689 Coagulation defect, unspecified: Secondary | ICD-10-CM | POA: Diagnosis not present

## 2022-07-21 DIAGNOSIS — N186 End stage renal disease: Secondary | ICD-10-CM | POA: Diagnosis not present

## 2022-07-21 DIAGNOSIS — N2581 Secondary hyperparathyroidism of renal origin: Secondary | ICD-10-CM | POA: Diagnosis not present

## 2022-07-21 DIAGNOSIS — D509 Iron deficiency anemia, unspecified: Secondary | ICD-10-CM | POA: Diagnosis not present

## 2022-07-21 DIAGNOSIS — R52 Pain, unspecified: Secondary | ICD-10-CM | POA: Diagnosis not present

## 2022-07-21 NOTE — Progress Notes (Signed)
PCP - Hoyt Koch, MD  Chest x-ray - 03/24/22 EKG - 03/24/22 ECHO - 03/24/22   Fasting Blood Sugar - 100's when he had a machine Checks Blood Sugar Does not have CBG machine currently. Awaiting new machine  ERAS Protcol - Clears until 0930   Anesthesia review: N  Patient verbally denies any shortness of breath, fever, cough and chest pain during phone call   -------------  SDW INSTRUCTIONS given:  Your procedure is scheduled on 07/23/22.  Report to [facility] Main Entrance "A" at 1000 A.M., and check in at the Admitting office.  Call this number if you have problems the morning of surgery:  [phone]   Remember:  Do not eat after midnight the night before your surgery  You may drink clear liquids until 0930 the morning of your surgery.   Clear liquids allowed are: Water, Non-Citrus Juices (without pulp), Carbonated Beverages, Clear Tea, Black Coffee Only, and Gatorade    Take these medicines the morning of surgery with A SIP OF WATER  amLODipine (NORVASC) carvedilol (COREG)  As of today, STOP taking any Aspirin (unless otherwise instructed by your surgeon) Aleve, Naproxen, Ibuprofen, Motrin, Advil, Goody's, BC's, all herbal medications, fish oil, and all vitamins.                      Do not wear jewelry, make up, or nail polish            Do not wear lotions, powders, perfumes/colognes, or deodorant.            Do not shave 48 hours prior to surgery.  Men may shave face and neck.            Do not bring valuables to the hospital.            [facility] is not responsible for any belongings or valuables.  Do NOT Smoke (Tobacco/Vaping) 24 hours prior to your procedure If you use a CPAP at night, you may bring all equipment for your overnight stay.   Contacts, glasses, dentures or bridgework may not be worn into surgery.      For patients admitted to the hospital, discharge time will be determined by your treatment team.   Patients discharged the day of  surgery will not be allowed to drive home, and someone needs to stay with them for 24 hours.    Special instructions:   [place]- Preparing For Surgery  Before surgery, you can play an important role. Because skin is not sterile, your skin needs to be as free of germs as possible. You can reduce the number of germs on your skin by washing with CHG (chlorahexidine gluconate) Soap before surgery.  CHG is an antiseptic cleaner which kills germs and bonds with the skin to continue killing germs even after washing.    Oral Hygiene is also important to reduce your risk of infection.  Remember - BRUSH YOUR TEETH THE MORNING OF SURGERY WITH YOUR REGULAR TOOTHPASTE  Please do not use if you have an allergy to CHG or antibacterial soaps. If your skin becomes reddened/irritated stop using the CHG.  Do not shave (including legs and underarms) for at least 48 hours prior to first CHG shower. It is OK to shave your face.  Please follow these instructions carefully.   Shower the NIGHT BEFORE SURGERY and the MORNING OF SURGERY with DIAL Soap.   Pat yourself dry with a CLEAN TOWEL.  Wear CLEAN PAJAMAS to  bed the night before surgery  Place CLEAN SHEETS on your bed the night of your first shower and DO NOT SLEEP WITH PETS.   Day of Surgery: Please shower morning of surgery  Wear Clean/Comfortable clothing the morning of surgery Do not apply any deodorants/lotions.   Remember to brush your teeth WITH YOUR REGULAR TOOTHPASTE.   Questions were answered. Patient verbalized understanding of instructions.

## 2022-07-21 NOTE — Telephone Encounter (Signed)
Patient said that he has been trying to get a meter for 2 weeks and that the wrong one was sent -please contact patient.

## 2022-07-22 DIAGNOSIS — D509 Iron deficiency anemia, unspecified: Secondary | ICD-10-CM | POA: Diagnosis not present

## 2022-07-22 DIAGNOSIS — N186 End stage renal disease: Secondary | ICD-10-CM | POA: Diagnosis not present

## 2022-07-22 DIAGNOSIS — R52 Pain, unspecified: Secondary | ICD-10-CM | POA: Diagnosis not present

## 2022-07-22 DIAGNOSIS — D631 Anemia in chronic kidney disease: Secondary | ICD-10-CM | POA: Diagnosis not present

## 2022-07-22 DIAGNOSIS — N2581 Secondary hyperparathyroidism of renal origin: Secondary | ICD-10-CM | POA: Diagnosis not present

## 2022-07-22 DIAGNOSIS — Z992 Dependence on renal dialysis: Secondary | ICD-10-CM | POA: Diagnosis not present

## 2022-07-22 DIAGNOSIS — D689 Coagulation defect, unspecified: Secondary | ICD-10-CM | POA: Diagnosis not present

## 2022-07-22 NOTE — Progress Notes (Deleted)
Initial neurology clinic note  SERVICE DATE: *** SERVICE TIME: ***  Reason for Evaluation: Consultation requested by Corliss Parish, MD for an opinion regarding ***. My final recommendations will be communicated back to the requesting physician by way of shared medical record or letter to requesting physician via [place] mail.  HPI: This is [name], a 46 y.o. ***-handed male with a medical history of HTN, HLD, DM2 c/b ESRD (on dialysis)*** who presents to neurology clinic with the chief complaint of ***. The patient is accompanied by ***.  ***  The patient has not*** had similar episodes of symptoms in the past. *** Muscle bulk loss? *** Muscle pain? *** Cramps/Twitching? *** Suggestion of myotonia/difficulty relaxing after contraction? *** Fatigable weakness?*** Does strength improve after brief exercise?*** Able to brush hair/teeth without difficulty? *** Able to button shirts/use zips? *** Clumsiness/dropping grasped objects?*** Can you arise from squatted position easily? *** Able to get out of chair without using arms? *** Able to walk up steps easily? *** Use an assistive device to walk? *** Significant imbalance with walking? *** Falls?*** Any change in urine color, especially after exertion/physical activity? ***  The patient denies*** symptoms suggestive of oculobulbar weakness including diplopia, ptosis, dysphagia, poor saliva control, dysarthria/dysphonia, impaired mastication, facial weakness/droop.  There are no*** neuromuscular respiratory weakness symptoms, particularly orthopnea>dyspnea. Pseudobulbar affect is absent***. The patient does not*** report symptoms referable to autonomic dysfunction including impaired sweating, heat or cold intolerance, excessive mucosal dryness, gastroparetic early satiety, postprandial abdominal bloating, constipation, bowel or bladder dyscontrol, erectile dysfunction*** or syncope/presyncope/orthostatic intolerance.  There are  no*** complaints relating to other symptoms of small fiber modalities including paresthesia/pain.  The patient has not *** noticed any recent skin rashes nor does he*** report any constitutional symptoms like fever, night sweats, anorexia or unintentional weight loss.  EtOH use: ***  Restrictive diet? *** Family history of neuropathy/myopathy/NM disease?***  Previous labs, electrodiagnostics, and neuroimaging are summarized below, but pertinent findings include***  Any biopsy done? *** Current medications being tried for the patient's symptoms include *** gabapentin 100 mg BID, increased to 200 mg BID Prior medications that have been tried: ***   MEDICATIONS:  Outpatient Encounter Medications as of 07/24/2022  Medication Sig   amLODipine (NORVASC) 10 MG tablet Take 1 tablet (10 mg total) by mouth daily.   blood glucose meter kit and supplies Dispense based on patient and insurance preference. Use up to four times daily as directed. (FOR ICD-10 E10.9, E11.9).   Blood Glucose Monitoring Suppl (ONETOUCH VERIO) w/Device KIT Use to check sugars BID dx E11.9   carvedilol (COREG) 6.25 MG tablet Take 1-2 tablets (6.25-12.5 mg total) by mouth 2 (two) times daily with a meal.   gabapentin (NEURONTIN) 100 MG capsule Take 100 mg by mouth 2 (two) times daily.   glucose blood (ONETOUCH VERIO) test strip 4x daily   Insulin Pen Needle (PEN NEEDLES) 31G X 5 MM MISC 1 Package by Does not apply route daily.   Multiple Vitamin (DAILY-VITE MULTIVITAMIN) TABS Take 1 tablet by mouth daily with supper.   oxyCODONE-acetaminophen (PERCOCET/ROXICET) 5-325 MG tablet Take 1 tablet by mouth every 6 (six) hours as needed. (Patient not taking: Reported on 07/17/2022)   sevelamer carbonate (RENVELA) 800 MG tablet Take 1 tablet (800 mg total) by mouth 3 (three) times daily with meals.   simvastatin (ZOCOR) 20 MG tablet TAKE 1 TABLET(20 MG) BY MOUTH AT BEDTIME   No facility-administered encounter medications on file as of  07/24/2022.  PAST MEDICAL HISTORY: Past Medical History:  Diagnosis Date   Anemia    Depression    Diabetes mellitus without complication (HCC)    Type II- no meds   Hypertension    Renal disorder     PAST SURGICAL HISTORY: Past Surgical History:  Procedure Laterality Date   AV FISTULA PLACEMENT Left 03/26/2022   Procedure: ARTERIOVENOUS (AV) FISTULA CREATION;  Surgeon: Cherre Robins, MD;  Location: [place];  Service: Vascular;  Laterality: Left;   AV FISTULA PLACEMENT Left 05/22/2022   Procedure: FIRSTSTAGE BASILIC ARTERIOVENOUS  FISTULA CREATION;  Surgeon: Cherre Robins, MD;  Location: [place];  Service: Vascular;  Laterality: Left;   INSERTION OF DIALYSIS CATHETER Right 03/26/2022   Procedure: INSERTION OF DIALYSIS CATHETER USING PALINDROME PRECISOIN CHRONIC CATHETER KIT (19cm);  Surgeon: Cherre Robins, MD;  Location: [facility];  Service: Vascular;  Laterality: Right;    ALLERGIES: No Known Allergies  FAMILY HISTORY: Family History  Problem Relation Age of Onset   Diabetes Maternal Grandfather     SOCIAL HISTORY: Social History   Tobacco Use   Smoking status: Every Day    Packs/day: 1.00    Types: Cigarettes    Passive exposure: Current   Smokeless tobacco: Never  Vaping Use   Vaping Use: Never used  Substance Use Topics   Alcohol use: No   Drug use: No   Social History   Social History Narrative   Not on file     OBJECTIVE: PHYSICAL EXAM: There were no vitals taken for this visit.  General:*** General appearance: Awake and alert. No distress. Cooperative with exam.  Skin: No obvious rash or jaundice. HEENT: Atraumatic. Anicteric. Lungs: Non-labored breathing on room air  Heart: Regular Abdomen: Soft, non tender. Extremities: No edema. No obvious deformity.  Musculoskeletal: No obvious joint swelling. Psych: Affect appropriate.  Neurological: Mental Status: Alert. Speech fluent. No pseudobulbar affect Cranial Nerves: CNII: No RAPD. Visual  fields intact. CNIII, IV, VI: PERRL. No nystagmus. EOMI. CN V: Facial sensation intact bilaterally to fine touch. Masseter clench strong. Jaw jerk***. CN VII: Facial muscles symmetric and strong. No ptosis at rest or after sustained upgaze***. CN VIII: Hears finger rub well bilaterally. CN IX: No hypophonia. CN X: Palate elevates symmetrically. CN XI: Full strength shoulder shrug bilaterally. CN XII: Tongue protrusion full and midline. No atrophy or fasciculations. No significant dysarthria*** Motor: Tone is ***. *** fasciculations in *** extremities. *** atrophy. No grip or percussive myotonia.  Individual muscle group testing (MRC grade out of 5):  Movement     Neck flexion ***    Neck extension ***     Right Left   Shoulder abduction *** ***   Shoulder adduction *** ***   Shoulder ext rotation *** ***   Shoulder int rotation *** ***   Elbow flexion *** ***   Elbow extension *** ***   Wrist extension *** ***   Wrist flexion *** ***   Finger abduction - FDI *** ***   Finger abduction - ADM *** ***   Finger extension *** ***   Finger distal flexion - 2/3 *** ***   Finger distal flexion - 4/5 *** ***   Thumb flexion - FPL *** ***   Thumb abduction - APB *** ***    Hip flexion *** ***   Hip extension *** ***   Hip adduction *** ***   Hip abduction *** ***   Knee extension *** ***   Knee flexion *** ***  Dorsiflexion *** ***   Plantarflexion *** ***   Inversion *** ***   Eversion *** ***   Great toe extension *** ***   Great toe flexion *** ***     Reflexes:  Right Left   Bicep *** ***   Tricep *** ***   BrRad *** ***   Knee *** ***   Ankle *** ***    Pathological Reflexes: Babinski: *** response bilaterally*** Hoffman: *** Troemner: *** Pectoral: *** Palmomental: *** Facial: *** Midline tap: *** Sensation: Pinprick: *** Vibration: *** Temperature: *** Proprioception: *** Coordination: Intact finger-to- nose-finger bilaterally. Romberg  negative.*** Gait: Able to rise from chair with arms crossed unassisted. Normal, narrow-based gait. Able to tandem walk. Able to walk on toes and heels.***  Lab and Test Review: Internal labs: Normal or unremarkable: TSH B12 (03/26/22): 502 Chem 8 remarkable for Cr 7.40 CBC remarkable for Hb of 7.8 HbA1c: 03/24/22: 5.4 02/27/21: 6.1 09/21/19: 7.0 03/11/19: 14.6 08/27/18: 14.9 12/04/17: 11.8 09/01/17: 8.5 ***  External labs: HbA1c (03/28/22): 5.5 ***  CT head wo contrast (04/21/22): FINDINGS: Brain: No intracranial hemorrhage, mass effect, or midline shift. Brain volume is normal for age. No hydrocephalus. The basilar cisterns are patent. No evidence of territorial infarct or acute ischemia. No encephalomalacia to suggest prior infarct. No extra-axial or intracranial fluid collection.   Vascular: Mild atherosclerosis of the carotid siphons. No hyperdense vessel.   Skull: No fracture or focal lesion.   Sinuses/Orbits: Paranasal sinuses and mastoid air cells are clear. The visualized orbits are unremarkable.   Other: None.   IMPRESSION: Negative noncontrast head CT.  No explanation for symptoms. ***  ASSESSMENT: Tramon Crescenzo is a 46 y.o. male who presents for evaluation of ***. *** has a relevant medical history of ***. *** neurological examination is pertinent for ***. Available diagnostic data is significant for ***. This constellation of symptoms and objective data would most likely localize to ***. ***  PLAN: -Blood work: *** ***  -Return to clinic ***  The impression above as well as the plan as outlined below were extensively discussed with the patient (in the company of ***) who voiced understanding. All questions were answered to their satisfaction.  The patient was counseled on pertinent fall precautions per the printed material provided today ***, and as noted under the "Patient Instructions" section below.  When available, results of the above  investigations and possible further recommendations will be communicated to the patient via telephone/MyChart. Patient to call office if not contacted after expected testing turnaround time.   Total time spent reviewing records, interview, history/exam, documentation, and coordination of care on day of encounter:  *** min   Thank you for allowing me to participate in patient's care.  If I can answer any additional questions, I would be pleased to do so.  Kai Levins, MD   CC: Hoyt Koch, MD [place] [id number]  [place]: Referring provider: Corliss Parish, [address],  [place] [address]

## 2022-07-22 NOTE — H&P (Signed)
  Patient: Levi Hodges  PID: [id number]  DOB: 13-Jun-1976  SEX: Male   Patient referred by DDS for extraction all remaining teeth  CC: Bad teeth.  Past Medical History:  HTN, CKD, Smoker, Diabetes, Dialysis, Arthritis    Medications: Gabapentin, Simvastatin, Carvedilol, Amlodipine, sevelamer carbonate    Allergies:     NKDA    Surgeries:   fistula     Social History       Smoking: 1 ppd           Alcohol: no Drug use:   no                           Exam: BMI 22. Gross decay, calculus, periodontal disease all remaining teeth.  No purulence, edema, fluctuance, trismus. Oral cancer screening negative. Pharynx clear. No lymphadenopathy.  Panorex: Generalized bone loss, impacted #32.  Assessment: ASA 3. Non-restorable  teeth # 2, 3, 4, 5, 6, 7, 12, 14, 15, 19, 20, 21, 22, 23, 24, 25, 26, 27, 28, 29, impacted #32 secondary to severe periodontal disease.             Plan: 1. MD Clearance  2. Extraction Teeth #2, 3, 4, 5, 6, 7, 12, 14, 15, 19, 20, 21, 22, 23, 24, 25, 26, 27, 28, 29, 32; Alveoloplasty.   Hospital Day surgery.                 Rx: n               Risks and complications explained. Questions answered.   Gae Bon, DMD

## 2022-07-23 ENCOUNTER — Encounter (HOSPITAL_COMMUNITY): Payer: Self-pay | Admitting: Oral Surgery

## 2022-07-23 ENCOUNTER — Other Ambulatory Visit: Payer: Self-pay

## 2022-07-23 ENCOUNTER — Ambulatory Visit (HOSPITAL_COMMUNITY)
Admission: RE | Admit: 2022-07-23 | Discharge: 2022-07-23 | Disposition: A | Discharge status: Home / Self Care | Payer: Medicare Other | Source: Ambulatory Visit | Attending: Oral Surgery | Admitting: Oral Surgery

## 2022-07-23 ENCOUNTER — Ambulatory Visit (HOSPITAL_BASED_OUTPATIENT_CLINIC_OR_DEPARTMENT_OTHER): Payer: Medicare Other | Admitting: Anesthesiology

## 2022-07-23 ENCOUNTER — Ambulatory Visit (HOSPITAL_COMMUNITY): Payer: Medicare Other | Admitting: Anesthesiology

## 2022-07-23 ENCOUNTER — Encounter (HOSPITAL_COMMUNITY)
Admission: RE | Disposition: A | Discharge status: Home / Self Care | Payer: Self-pay | Source: Ambulatory Visit | Attending: Oral Surgery

## 2022-07-23 DIAGNOSIS — N186 End stage renal disease: Secondary | ICD-10-CM | POA: Diagnosis not present

## 2022-07-23 DIAGNOSIS — Z992 Dependence on renal dialysis: Secondary | ICD-10-CM | POA: Diagnosis not present

## 2022-07-23 DIAGNOSIS — E1122 Type 2 diabetes mellitus with diabetic chronic kidney disease: Secondary | ICD-10-CM | POA: Insufficient documentation

## 2022-07-23 DIAGNOSIS — K0850 Unsatisfactory restoration of tooth, unspecified: Secondary | ICD-10-CM

## 2022-07-23 DIAGNOSIS — I120 Hypertensive chronic kidney disease with stage 5 chronic kidney disease or end stage renal disease: Secondary | ICD-10-CM | POA: Diagnosis not present

## 2022-07-23 DIAGNOSIS — K011 Impacted teeth: Secondary | ICD-10-CM

## 2022-07-23 DIAGNOSIS — K05323 Chronic periodontitis, generalized, severe: Secondary | ICD-10-CM | POA: Diagnosis not present

## 2022-07-23 DIAGNOSIS — K0889 Other specified disorders of teeth and supporting structures: Secondary | ICD-10-CM | POA: Diagnosis not present

## 2022-07-23 DIAGNOSIS — K0510 Chronic gingivitis, plaque induced: Secondary | ICD-10-CM | POA: Insufficient documentation

## 2022-07-23 DIAGNOSIS — D631 Anemia in chronic kidney disease: Secondary | ICD-10-CM | POA: Diagnosis not present

## 2022-07-23 DIAGNOSIS — F17210 Nicotine dependence, cigarettes, uncomplicated: Secondary | ICD-10-CM | POA: Insufficient documentation

## 2022-07-23 HISTORY — PX: TOOTH EXTRACTION: SHX859

## 2022-07-23 LAB — POCT I-STAT, CHEM 8
BUN: 22 mg/dL — ABNORMAL HIGH (ref 6–20)
Calcium, Ion: 1.08 mmol/L — ABNORMAL LOW (ref 1.15–1.40)
Chloride: 97 mmol/L — ABNORMAL LOW (ref 98–111)
Creatinine, Ser: 7 mg/dL — ABNORMAL HIGH (ref 0.61–1.24)
Glucose, Bld: 84 mg/dL (ref 70–99)
HCT: 37 % — ABNORMAL LOW (ref 39.0–52.0)
Hemoglobin: 12.6 g/dL — ABNORMAL LOW (ref 13.0–17.0)
Potassium: 3.9 mmol/L (ref 3.5–5.1)
Sodium: 140 mmol/L (ref 135–145)
TCO2: 34 mmol/L — ABNORMAL HIGH (ref 22–32)

## 2022-07-23 LAB — GLUCOSE, CAPILLARY
Glucose-Capillary: 87 mg/dL (ref 70–99)
Glucose-Capillary: 115 mg/dL — ABNORMAL HIGH (ref 70–99)
Glucose-Capillary: 72 mg/dL (ref 70–99)
Glucose-Capillary: 121 mg/dL — ABNORMAL HIGH (ref 70–99)

## 2022-07-23 SURGERY — DENTAL RESTORATION/EXTRACTIONS
Anesthesia: General

## 2022-07-23 MED ORDER — PHENYLEPHRINE HCL-NACL 20-0.9 MG/250ML-% IV SOLN
INTRAVENOUS | Status: DC | PRN
  Administered 2022-07-23: 20 ug/min via INTRAVENOUS

## 2022-07-23 MED ORDER — ACETAMINOPHEN 500 MG PO TABS: 1000.0000 mg | ORAL_TABLET | Freq: Once | ORAL | Status: AC

## 2022-07-23 MED ORDER — DEXTROSE 50 % IV SOLN
12.5000 g | Freq: Once | INTRAVENOUS | Status: AC
  Administered 2022-07-23: 12.5 g via INTRAVENOUS

## 2022-07-23 MED ORDER — FENTANYL CITRATE (PF) 100 MCG/2ML IJ SOLN
25.0000 ug | INTRAMUSCULAR | Status: DC | PRN
  Administered 2022-07-23: 25 ug via INTRAVENOUS
  Administered 2022-07-23: 50 ug via INTRAVENOUS

## 2022-07-23 MED ORDER — OXYCODONE HCL 5 MG/5ML PO SOLN: 5.0000 mg | Freq: Once | ORAL | Status: DC | PRN

## 2022-07-23 MED ORDER — CHLORHEXIDINE GLUCONATE 0.12 % MT SOLN: 15.0000 mL | Freq: Once | OROMUCOSAL | Status: AC

## 2022-07-23 MED ORDER — ACETAMINOPHEN 500 MG PO TABS
ORAL_TABLET | ORAL | Status: AC
  Administered 2022-07-23: 1000 mg via ORAL
  Filled 2022-07-23: qty 2

## 2022-07-23 MED ORDER — ONDANSETRON HCL 4 MG/2ML IJ SOLN
INTRAMUSCULAR | Status: DC | PRN
  Administered 2022-07-23: 4 mg via INTRAVENOUS

## 2022-07-23 MED ORDER — ROCURONIUM BROMIDE 10 MG/ML (PF) SYRINGE
PREFILLED_SYRINGE | INTRAVENOUS | Status: DC | PRN
  Administered 2022-07-23: 70 mg via INTRAVENOUS

## 2022-07-23 MED ORDER — STERILE WATER FOR IRRIGATION IR SOLN
Status: DC | PRN
  Administered 2022-07-23: 1000 mL

## 2022-07-23 MED ORDER — SODIUM CHLORIDE 0.9 % IV SOLN: INTRAVENOUS | Status: DC

## 2022-07-23 MED ORDER — DEXAMETHASONE SODIUM PHOSPHATE 10 MG/ML IJ SOLN
INTRAMUSCULAR | Status: DC | PRN
  Administered 2022-07-23: 10 mg via INTRAVENOUS

## 2022-07-23 MED ORDER — ONDANSETRON HCL 4 MG/2ML IJ SOLN: 4.0000 mg | Freq: Once | INTRAMUSCULAR | Status: DC | PRN

## 2022-07-23 MED ORDER — OXYCODONE-ACETAMINOPHEN 5-325 MG PO TABS: 1.0000 | ORAL_TABLET | ORAL | 0 refills | Status: DC | PRN

## 2022-07-23 MED ORDER — FENTANYL CITRATE (PF) 100 MCG/2ML IJ SOLN
INTRAMUSCULAR | Status: AC
  Filled 2022-07-23: qty 2

## 2022-07-23 MED ORDER — OXYCODONE HCL 5 MG PO TABS: 5.0000 mg | ORAL_TABLET | Freq: Once | ORAL | Status: DC | PRN

## 2022-07-23 MED ORDER — AMISULPRIDE (ANTIEMETIC) 5 MG/2ML IV SOLN: 10.0000 mg | Freq: Once | INTRAVENOUS | Status: DC | PRN

## 2022-07-23 MED ORDER — CEFAZOLIN SODIUM-DEXTROSE 2-4 GM/100ML-% IV SOLN
INTRAVENOUS | Status: AC
  Filled 2022-07-23: qty 100

## 2022-07-23 MED ORDER — SUGAMMADEX SODIUM 200 MG/2ML IV SOLN
INTRAVENOUS | Status: DC | PRN
  Administered 2022-07-23: 268.4 mg via INTRAVENOUS

## 2022-07-23 MED ORDER — CEFAZOLIN SODIUM-DEXTROSE 2-4 GM/100ML-% IV SOLN
2.0000 g | INTRAVENOUS | Status: AC
  Administered 2022-07-23: 2 g via INTRAVENOUS

## 2022-07-23 MED ORDER — MIDAZOLAM HCL 2 MG/2ML IJ SOLN
INTRAMUSCULAR | Status: DC | PRN
  Administered 2022-07-23: 2 mg via INTRAVENOUS

## 2022-07-23 MED ORDER — DEXTROSE 50 % IV SOLN
INTRAVENOUS | Status: AC
  Filled 2022-07-23: qty 50

## 2022-07-23 MED ORDER — OXYMETAZOLINE HCL 0.05 % NA SOLN
NASAL | Status: DC | PRN
  Administered 2022-07-23 (×2): 2 via NASAL

## 2022-07-23 MED ORDER — INSULIN ASPART 100 UNIT/ML IJ SOLN: 0.0000 [IU] | INTRAMUSCULAR | Status: DC | PRN

## 2022-07-23 MED ORDER — MIDAZOLAM HCL 2 MG/2ML IJ SOLN
INTRAMUSCULAR | Status: AC
  Filled 2022-07-23: qty 2

## 2022-07-23 MED ORDER — SODIUM CHLORIDE 0.9 % IR SOLN
Status: DC | PRN
  Administered 2022-07-23: 1000 mL

## 2022-07-23 MED ORDER — OXYMETAZOLINE HCL 0.05 % NA SOLN
NASAL | Status: AC
  Filled 2022-07-23: qty 30

## 2022-07-23 MED ORDER — AMOXICILLIN 500 MG PO CAPS: 500.0000 mg | ORAL_CAPSULE | Freq: Two times a day (BID) | ORAL | 0 refills | Status: DC

## 2022-07-23 MED ORDER — 0.9 % SODIUM CHLORIDE (POUR BTL) OPTIME
TOPICAL | Status: DC | PRN
  Administered 2022-07-23: 1000 mL

## 2022-07-23 MED ORDER — LIDOCAINE-EPINEPHRINE 2 %-1:100000 IJ SOLN
INTRAMUSCULAR | Status: AC
  Filled 2022-07-23: qty 1

## 2022-07-23 MED ORDER — LIDOCAINE 2% (20 MG/ML) 5 ML SYRINGE
INTRAMUSCULAR | Status: DC | PRN
  Administered 2022-07-23: 100 mg via INTRAVENOUS

## 2022-07-23 MED ORDER — PROPOFOL 10 MG/ML IV BOLUS
INTRAVENOUS | Status: DC | PRN
  Administered 2022-07-23: 200 mg via INTRAVENOUS

## 2022-07-23 MED ORDER — FENTANYL CITRATE (PF) 250 MCG/5ML IJ SOLN
INTRAMUSCULAR | Status: DC | PRN
  Administered 2022-07-23: 100 ug via INTRAVENOUS
  Administered 2022-07-23: 50 ug via INTRAVENOUS

## 2022-07-23 MED ORDER — LIDOCAINE-EPINEPHRINE 2 %-1:100000 IJ SOLN
INTRAMUSCULAR | Status: DC | PRN
  Administered 2022-07-23: 20 mL

## 2022-07-23 MED ORDER — ORAL CARE MOUTH RINSE: 15.0000 mL | Freq: Once | OROMUCOSAL | Status: AC

## 2022-07-23 MED ORDER — CHLORHEXIDINE GLUCONATE 0.12 % MT SOLN
OROMUCOSAL | Status: AC
  Administered 2022-07-23: 15 mL via OROMUCOSAL
  Filled 2022-07-23: qty 15

## 2022-07-23 MED ORDER — FENTANYL CITRATE (PF) 250 MCG/5ML IJ SOLN
INTRAMUSCULAR | Status: AC
  Filled 2022-07-23: qty 5

## 2022-07-23 SURGICAL SUPPLY — 33 items
BAG COUNTER SPONGE SURGICOUNT (BAG) IMPLANT
BLADE SURG 15 STRL LF DISP TIS (BLADE) ×1 IMPLANT
BLADE SURG 15 STRL SS (BLADE) ×1
BUR CROSS CUT FISSURE 1.6 (BURR) ×1 IMPLANT
BUR EGG ELITE 4.0 (BURR) ×1 IMPLANT
CANISTER SUCT 3000ML PPV (MISCELLANEOUS) ×1 IMPLANT
COVER SURGICAL LIGHT HANDLE (MISCELLANEOUS) ×1 IMPLANT
DRAPE U-SHAPE 76X120 STRL (DRAPES) ×1 IMPLANT
GAUZE PACKING FOLDED 2  STR (GAUZE/BANDAGES/DRESSINGS) ×1
GAUZE PACKING FOLDED 2 STR (GAUZE/BANDAGES/DRESSINGS) ×1 IMPLANT
GLOVE BIO SURGEON STRL SZ 6.5 (GLOVE) IMPLANT
GLOVE BIO SURGEON STRL SZ7 (GLOVE) IMPLANT
GLOVE BIO SURGEON STRL SZ8 (GLOVE) ×1 IMPLANT
GOWN STRL REUS W/ TWL LRG LVL3 (GOWN DISPOSABLE) ×1 IMPLANT
GOWN STRL REUS W/ TWL XL LVL3 (GOWN DISPOSABLE) ×1 IMPLANT
GOWN STRL REUS W/TWL LRG LVL3 (GOWN DISPOSABLE) ×1
GOWN STRL REUS W/TWL XL LVL3 (GOWN DISPOSABLE) ×1
IV NS 1000ML (IV SOLUTION) ×1
IV NS 1000ML BAXH (IV SOLUTION) ×1 IMPLANT
KIT BASIN OR (CUSTOM PROCEDURE TRAY) ×1 IMPLANT
KIT TURNOVER KIT B (KITS) ×1 IMPLANT
NDL HYPO 25GX1X1/2 BEV (NEEDLE) ×2 IMPLANT
NEEDLE HYPO 25GX1X1/2 BEV (NEEDLE) ×2 IMPLANT
NS IRRIG 1000ML POUR BTL (IV SOLUTION) ×1 IMPLANT
PAD ARMBOARD 7.5X6 YLW CONV (MISCELLANEOUS) ×1 IMPLANT
SLEEVE IRRIGATION ELITE 7 (MISCELLANEOUS) ×1 IMPLANT
SPONGE SURGIFOAM ABS GEL 12-7 (HEMOSTASIS) IMPLANT
SUT CHROMIC 3 0 PS 2 (SUTURE) ×1 IMPLANT
SYR BULB IRRIG 60ML STRL (SYRINGE) ×1 IMPLANT
SYR CONTROL 10ML LL (SYRINGE) ×1 IMPLANT
TRAY ENT MC OR (CUSTOM PROCEDURE TRAY) ×1 IMPLANT
TUBING IRRIGATION (MISCELLANEOUS) ×1 IMPLANT
YANKAUER SUCT BULB TIP NO VENT (SUCTIONS) ×1 IMPLANT

## 2022-07-23 NOTE — Op Note (Unsigned)
NAME: Levi Hodges, Levi Hodges MEDICAL RECORD NO: [id number] ACCOUNT NO: [id number] DATE OF BIRTH: [birth year] FACILITY: [facility] LOCATION: [facility] PHYSICIAN: Gae Bon, DDS  Operative Report   DATE OF PROCEDURE: 07/23/2022  PREOPERATIVE DIAGNOSIS:  Nonrestorable teeth numbers 2, 3, 4, 5, 6, 7, 12, 14, 15, 19, 20, 21, 22, 23, 24, 25, 26, 27, 28, 29; impacted tooth #32.  Severe generalized periodontitis secondary to severe gingivitis.  POSTOPERATIVE DIAGNOSIS:  Nonrestorable teeth numbers 2, 3, 4, 5, 6, 7, 12, 14, 15, 19, 20, 21, 22, 23, 24, 25, 26, 27, 28, 29; impacted tooth #32.  Severe generalized periodontitis secondary to severe gingivitis.  PROCEDURE:  Extraction teeth numbers 2, 3, 4, 5, 6, 7, 12, 14, 15, 19, 20, 21, 22, 23, 24, 25, 26, 27, 28, 29.  Extraction of impacted tooth #32, alveoloplasty.  SURGEON:  Gae Bon, DDS  ANESTHESIA:  General, nasal intubation, Dr. Kerin Perna, attending.  DESCRIPTION OF PROCEDURE:  The patient was taken to the operating room and placed on the table in supine position.  General anesthesia was administered and nasal endotracheal was placed and secured.  The eyes were protected and the patient was draped for  surgery.  Timeout was performed.  The posterior pharynx was suctioned and a throat pack was placed.  2% lidocaine 1:100,000 epinephrine was infiltrated in an inferior alveolar block on the right and left sides with buccal anesthesia in the anterior  mandible and in buccal and palatal anesthesia was given in the maxilla around the teeth to be removed.  A bite block was placed on the right side of the mouth.  A sweetheart retractor was used to retract the tongue.  A #15 blade was used to make an  incision beginning approximately the tooth #19 and carried forward in the gingiva until tooth #27 was encountered.  This incision was made on the buccal and lingual aspect of the mandible around the teeth.  The teeth were removed with the forceps.  No  elevation  was necessary due to the severe periodontal disease.  The soft tissue was debrided and trimmed to allow for primary closure.  It was reflected to expose the alveolar crest, which was irregular.  Alveoplasty was performed using the egg bur  followed by the bone file.  Then, the area was irrigated and closed with 3-0 chromic.  The left maxilla was operated.  Next a 15 blade was used to make an incision around teeth numbers 12, 14 and 15. Excess gingiva was trimmed.  The teeth were removed  with the dental forceps and then the irregular bone was smoothed with a bone file and alveoplasty was performed with an egg bur.  Then, this area was irrigated and closed with 3-0 chromic.  The bite block and sweetheart retractor were repositioned to the  other side of the mouth.  A 15 blade was used to make an incision overlying tooth #32 carried forward on the edentulous ridge and then split into buccal and lingual incisions around teeth numbers 28 and 29.  The periosteum was reflected.  The teeth were  removed with the dental forceps.  Tooth #32 required removal of circumferential bone with multiple sections with a Stryker handpiece and fissure bur.  The tooth was eventually removed and then the socket was curetted.  The periosteum was reflected to  expose the alveolar crest, which was irregular in contour around teeth numbers 28 and 29.  Alveoplasty was therefore performed with the egg bur followed by the bone file.  Then, this area was irrigated and closed with 3-0 chromic and the right maxilla  with the 15 blade was used to make an incision in the buccal and palatal gingiva around teeth numbers 2, 3, 4, 5, 6 and 7.  The teeth were removed with the dental forceps.  The area was debrided and the tissue was trimmed to remove granulation tissue and  to allow for primary closure.  Then, the area was reflected to expose the alveolar crest, which was irregular.  It was smoothed with egg bur under irrigation and the bone file  to perform the alveoloplasty.  Then, the area was irrigated and closed with  3-0 chromic.  Additional local anesthesia was administered.  The oral cavity was then irrigated and suctioned.  The throat pack was removed.  The patient was left under care of anesthesia for extubation and transported to recovery with plans for  discharge home through day surgery.  ESTIMATED BLOOD LOSS:  50 mL.  SPECIMENS:  None.  COMPLICATIONS:  None.   PUS D: 07/23/2022 1:53:49 pm T: 07/23/2022 4:54:00 pm  JOB: [id number]/ [id number]

## 2022-07-23 NOTE — Anesthesia Preprocedure Evaluation (Addendum)
Anesthesia Evaluation  Patient identified by MRN, date of birth, ID band Patient awake    Reviewed: Allergy & Precautions, NPO status , Patient's Chart, lab work & pertinent test results, reviewed documented beta blocker date and time   History of Anesthesia Complications Negative for: history of anesthetic complications  Airway Mallampati: II  TM Distance: >3 FB Neck ROM: Full    Dental  (+) Dental Advisory Given, Missing, Poor Dentition, Loose   Pulmonary Current Smoker,    Pulmonary exam normal        Cardiovascular hypertension, Pt. on medications and Pt. on home beta blockers Normal cardiovascular exam     Neuro/Psych negative neurological ROS     GI/Hepatic negative GI ROS, Neg liver ROS,   Endo/Other  diabetes, Type 2  Renal/GU ESRFRenal disease  negative genitourinary   Musculoskeletal negative musculoskeletal ROS (+)   Abdominal   Peds  Hematology  (+) Blood dyscrasia, anemia ,   Anesthesia Other Findings   Reproductive/Obstetrics                           Anesthesia Physical Anesthesia Plan  ASA: 3  Anesthesia Plan: General   Post-op Pain Management: Tylenol PO (pre-op)*   Induction: Intravenous  PONV Risk Score and Plan: 1 and Ondansetron, Dexamethasone, Treatment may vary due to age or medical condition and Midazolam  Airway Management Planned: Nasal ETT  Additional Equipment: None  Intra-op Plan:   Post-operative Plan: Extubation in OR  Informed Consent: I have reviewed the patients History and Physical, chart, labs and discussed the procedure including the risks, benefits and alternatives for the proposed anesthesia with the patient or authorized representative who has indicated his/her understanding and acceptance.     Dental advisory given  Plan Discussed with:   Anesthesia Plan Comments:       Anesthesia Quick Evaluation

## 2022-07-23 NOTE — Anesthesia Procedure Notes (Signed)
Procedure Name: Intubation Date/Time: 07/23/2022 12:40 PM  Performed by: Minerva Ends, CRNAPre-anesthesia Checklist: Patient identified, Emergency Drugs available, Suction available and Patient being monitored Patient Re-evaluated:Patient Re-evaluated prior to induction Oxygen Delivery Method: Circle system utilized Preoxygenation: Pre-oxygenation with 100% oxygen Induction Type: IV induction Ventilation: Mask ventilation without difficulty Laryngoscope Size: Mac and 3 Grade View: Grade II Nasal Tubes: Nasal prep performed, Nasal Rae and Right Tube size: 7.0 mm Number of attempts: 1 Placement Confirmation: ETT inserted through vocal cords under direct vision, positive ETCO2 and breath sounds checked- equal and bilateral Secured at: 25 cm Tube secured with: Tape Dental Injury: Teeth and Oropharynx as per pre-operative assessment

## 2022-07-23 NOTE — Transfer of Care (Signed)
Immediate Anesthesia Transfer of Care Note  Patient: Levi Hodges  Procedure(s) Performed: DENTAL RESTORATION/EXTRACTIONS  Patient Location: PACU  Anesthesia Type:General  Level of Consciousness: awake and alert   Airway & Oxygen Therapy: Patient Spontanous Breathing and Patient connected to face mask oxygen  Post-op Assessment: Report given to RN and Post -op Vital signs reviewed and stable  Post vital signs: Reviewed and stable  Last Vitals:  Vitals Value Taken Time  BP 133/76 07/23/22 1400  Temp    Pulse 62 07/23/22 1401  Resp 13 07/23/22 1401  SpO2 100 % 07/23/22 1401  Vitals shown include unvalidated device data.  Last Pain:  Vitals:   07/23/22 1026  TempSrc:   PainSc: 0-No pain         Complications: No notable events documented.

## 2022-07-23 NOTE — Op Note (Signed)
07/23/2022  1:48 PM  PATIENT:  [name]  46 y.o. male  PRE-OPERATIVE DIAGNOSIS:  nonrestorable teeth # 2, 3, 4, 5, 6, 7, 12, 14, 15, 19, 20, 21, 22, 23, 24, 25, 26, 27, 28, 29, impacted tooth #32.  POST-OPERATIVE DIAGNOSIS:  SAME  PROCEDURE:  Procedure(s): EXTRACTION teeth # 2, 3, 4, 5, 6, 7, 12, 14, 15, 19, 20, 21, 22, 23, 24, 25, 26, 27, 28, 29, extraction impacted tooth #32. alveoloplasty  SURGEON:  Surgeon(s): Diona Browner, DMD  ANESTHESIA:   local and general  EBL:  minimal  DRAINS: none   SPECIMEN:  No Specimen  COUNTS:  YES  PLAN OF CARE: Discharge to home after PACU  PATIENT DISPOSITION:  PACU - hemodynamically stable.   PROCEDURE DETAILS: Dictation # [id number]  Gae Bon, DMD 07/23/2022 1:48 PM

## 2022-07-23 NOTE — Anesthesia Postprocedure Evaluation (Signed)
Anesthesia Post Note  Patient: Levi Hodges  Procedure(s) Performed: DENTAL RESTORATION/EXTRACTIONS     Patient location during evaluation: PACU Anesthesia Type: General Level of consciousness: awake and alert Pain management: pain level controlled Vital Signs Assessment: post-procedure vital signs reviewed and stable Respiratory status: spontaneous breathing, nonlabored ventilation and respiratory function stable Cardiovascular status: blood pressure returned to baseline and stable Postop Assessment: no apparent nausea or vomiting Anesthetic complications: no   No notable events documented.  Last Vitals:  Vitals:   07/23/22 1430 07/23/22 1445  BP: 127/72 118/75  Pulse: 65 72  Resp: 12 12  Temp:  36.7 C  SpO2: 96% 93%    Last Pain:  Vitals:   07/23/22 1410  TempSrc:   PainSc: 7                  Lorilei Horan E Dinesha Twiggs

## 2022-07-23 NOTE — H&P (Signed)
H&P documentation  -History and Physical Reviewed  -Patient has been re-examined  -No change in the plan of care  Trinity Haun  

## 2022-07-24 ENCOUNTER — Ambulatory Visit: Payer: Medicare Other | Admitting: Neurology

## 2022-07-24 ENCOUNTER — Encounter (HOSPITAL_COMMUNITY): Payer: Self-pay | Admitting: Oral Surgery

## 2022-07-25 DIAGNOSIS — D509 Iron deficiency anemia, unspecified: Secondary | ICD-10-CM | POA: Diagnosis not present

## 2022-07-25 DIAGNOSIS — Z992 Dependence on renal dialysis: Secondary | ICD-10-CM | POA: Diagnosis not present

## 2022-07-25 DIAGNOSIS — D631 Anemia in chronic kidney disease: Secondary | ICD-10-CM | POA: Diagnosis not present

## 2022-07-25 DIAGNOSIS — R52 Pain, unspecified: Secondary | ICD-10-CM | POA: Diagnosis not present

## 2022-07-25 DIAGNOSIS — N2581 Secondary hyperparathyroidism of renal origin: Secondary | ICD-10-CM | POA: Diagnosis not present

## 2022-07-25 DIAGNOSIS — D689 Coagulation defect, unspecified: Secondary | ICD-10-CM | POA: Diagnosis not present

## 2022-07-25 DIAGNOSIS — N186 End stage renal disease: Secondary | ICD-10-CM | POA: Diagnosis not present

## 2022-07-28 DIAGNOSIS — N186 End stage renal disease: Secondary | ICD-10-CM | POA: Diagnosis not present

## 2022-07-28 DIAGNOSIS — Z992 Dependence on renal dialysis: Secondary | ICD-10-CM | POA: Diagnosis not present

## 2022-07-28 DIAGNOSIS — R52 Pain, unspecified: Secondary | ICD-10-CM | POA: Diagnosis not present

## 2022-07-28 DIAGNOSIS — D631 Anemia in chronic kidney disease: Secondary | ICD-10-CM | POA: Diagnosis not present

## 2022-07-28 DIAGNOSIS — D689 Coagulation defect, unspecified: Secondary | ICD-10-CM | POA: Diagnosis not present

## 2022-07-28 DIAGNOSIS — D509 Iron deficiency anemia, unspecified: Secondary | ICD-10-CM | POA: Diagnosis not present

## 2022-07-28 DIAGNOSIS — N2581 Secondary hyperparathyroidism of renal origin: Secondary | ICD-10-CM | POA: Diagnosis not present

## 2022-07-28 MED ORDER — ACCU-CHEK AVIVA PLUS VI STRP: ORAL_STRIP | 12 refills | Status: DC

## 2022-07-28 MED ORDER — ACCU-CHEK MULTICLIX LANCETS MISC: 12 refills | Status: AC

## 2022-07-28 MED ORDER — ACCU-CHEK AVIVA PLUS W/DEVICE KIT: PACK | 0 refills | Status: DC

## 2022-07-28 NOTE — Addendum Note (Signed)
Addended by: Pricilla Holm A on: 07/28/2022 08:04 AM   Modules accepted: Orders

## 2022-07-28 NOTE — Telephone Encounter (Signed)
Spoke to [facility] pharmacist--stated accu-check aviva no longer or D/C in the market, but can change to [employer] pt insurance covered.

## 2022-07-28 NOTE — Telephone Encounter (Signed)
Can you please call him pharmacy today and make sure the accu chek I sent in will be okay with his insurance and if not please give verbals for a meter which is covered and document what type.

## 2022-07-29 ENCOUNTER — Emergency Department (HOSPITAL_COMMUNITY)
Admission: EM | Admit: 2022-07-29 | Discharge: 2022-07-29 | Disposition: A | Discharge status: Home / Self Care | Payer: Medicare Other | Attending: Emergency Medicine | Admitting: Emergency Medicine

## 2022-07-29 ENCOUNTER — Other Ambulatory Visit: Payer: Self-pay

## 2022-07-29 ENCOUNTER — Emergency Department (HOSPITAL_COMMUNITY)
Admission: EM | Admit: 2022-07-29 | Discharge: 2022-07-30 | Disposition: A | Discharge status: Home / Self Care | Payer: Medicare Other | Source: Home / Self Care | Attending: Emergency Medicine | Admitting: Emergency Medicine

## 2022-07-29 ENCOUNTER — Encounter (HOSPITAL_COMMUNITY): Payer: Self-pay

## 2022-07-29 ENCOUNTER — Encounter (HOSPITAL_COMMUNITY): Payer: Self-pay | Admitting: Emergency Medicine

## 2022-07-29 DIAGNOSIS — E1169 Type 2 diabetes mellitus with other specified complication: Secondary | ICD-10-CM | POA: Insufficient documentation

## 2022-07-29 DIAGNOSIS — Z98818 Other dental procedure status: Secondary | ICD-10-CM | POA: Insufficient documentation

## 2022-07-29 DIAGNOSIS — F17200 Nicotine dependence, unspecified, uncomplicated: Secondary | ICD-10-CM | POA: Insufficient documentation

## 2022-07-29 DIAGNOSIS — K1379 Other lesions of oral mucosa: Secondary | ICD-10-CM | POA: Diagnosis not present

## 2022-07-29 DIAGNOSIS — Z9289 Personal history of other medical treatment: Secondary | ICD-10-CM

## 2022-07-29 DIAGNOSIS — I120 Hypertensive chronic kidney disease with stage 5 chronic kidney disease or end stage renal disease: Secondary | ICD-10-CM | POA: Insufficient documentation

## 2022-07-29 DIAGNOSIS — Z794 Long term (current) use of insulin: Secondary | ICD-10-CM | POA: Insufficient documentation

## 2022-07-29 DIAGNOSIS — R041 Hemorrhage from throat: Secondary | ICD-10-CM | POA: Insufficient documentation

## 2022-07-29 DIAGNOSIS — N186 End stage renal disease: Secondary | ICD-10-CM | POA: Insufficient documentation

## 2022-07-29 DIAGNOSIS — E785 Hyperlipidemia, unspecified: Secondary | ICD-10-CM | POA: Insufficient documentation

## 2022-07-29 DIAGNOSIS — E1122 Type 2 diabetes mellitus with diabetic chronic kidney disease: Secondary | ICD-10-CM | POA: Insufficient documentation

## 2022-07-29 DIAGNOSIS — Z79899 Other long term (current) drug therapy: Secondary | ICD-10-CM | POA: Insufficient documentation

## 2022-07-29 DIAGNOSIS — K068 Other specified disorders of gingiva and edentulous alveolar ridge: Secondary | ICD-10-CM | POA: Diagnosis not present

## 2022-07-29 DIAGNOSIS — Z992 Dependence on renal dialysis: Secondary | ICD-10-CM | POA: Insufficient documentation

## 2022-07-29 LAB — COMPREHENSIVE METABOLIC PANEL
ALT: 6 U/L (ref 0–44)
AST: 23 U/L (ref 15–41)
Albumin: 3.9 g/dL (ref 3.5–5.0)
Alkaline Phosphatase: 55 U/L (ref 38–126)
Anion gap: 14 (ref 5–15)
BUN: 29 mg/dL — ABNORMAL HIGH (ref 6–20)
CO2: 30 mmol/L (ref 22–32)
Calcium: 9.1 mg/dL (ref 8.9–10.3)
Chloride: 96 mmol/L — ABNORMAL LOW (ref 98–111)
Creatinine, Ser: 9.27 mg/dL — ABNORMAL HIGH (ref 0.61–1.24)
GFR, Estimated: 6 mL/min — ABNORMAL LOW (ref >60)
Glucose, Bld: 112 mg/dL — ABNORMAL HIGH (ref 70–99)
Potassium: 3.7 mmol/L (ref 3.5–5.1)
Sodium: 140 mmol/L (ref 135–145)
Total Bilirubin: 0.9 mg/dL (ref 0.3–1.2)
Total Protein: 8.1 g/dL (ref 6.5–8.1)

## 2022-07-29 LAB — CBC WITH DIFFERENTIAL/PLATELET
Abs Immature Granulocytes: 0.02 10*3/uL (ref 0.00–0.07)
Basophils Absolute: 0.1 10*3/uL (ref 0.0–0.1)
Basophils Relative: 1 %
Eosinophils Absolute: 0.3 10*3/uL (ref 0.0–0.5)
Eosinophils Relative: 4 %
HCT: 26.8 % — ABNORMAL LOW (ref 39.0–52.0)
Hemoglobin: 8.5 g/dL — ABNORMAL LOW (ref 13.0–17.0)
Immature Granulocytes: 0 %
Lymphocytes Relative: 20 %
Lymphs Abs: 1.6 10*3/uL (ref 0.7–4.0)
MCH: 27.4 pg (ref 26.0–34.0)
MCHC: 31.7 g/dL (ref 30.0–36.0)
MCV: 86.5 fL (ref 80.0–100.0)
Monocytes Absolute: 0.9 10*3/uL (ref 0.1–1.0)
Monocytes Relative: 12 %
Neutro Abs: 5.1 10*3/uL (ref 1.7–7.7)
Neutrophils Relative %: 63 %
Platelets: 328 10*3/uL (ref 150–400)
RBC: 3.1 MIL/uL — ABNORMAL LOW (ref 4.22–5.81)
RDW: 15.9 % — ABNORMAL HIGH (ref 11.5–15.5)
WBC: 8.1 10*3/uL (ref 4.0–10.5)
nRBC: 0 % (ref 0.0–0.2)

## 2022-07-29 MED ORDER — TRANEXAMIC ACID FOR EPISTAXIS
500.0000 mg | Freq: Once | TOPICAL | Status: AC
  Administered 2022-07-29: 500 mg via TOPICAL
  Filled 2022-07-29 (×2): qty 10

## 2022-07-29 NOTE — ED Provider Triage Note (Signed)
Emergency Medicine Provider Triage Evaluation Note  Levi Hodges , a 46 y.o. male  was evaluated in triage.  Pt complains of bleeding.  Patient had 20 teeth pulled from his mouth by Dr. Hoyt Koch, was at [facility] earlier however eloped after getting upset for not being able to have his blood drawn per significant other.  He has been bleeding nonstop, he is on dialysis.  They were not able to obtain blood earlier.  They were advised by Dr. Hoyt Koch to return to the emergency department.  He is currently on no blood thinners.  Review of Systems  Positive: Oral bleeding Negative: Nausea, abdominal pain  Physical Exam  BP (!) 143/81 (BP Location: Right Arm)   Pulse 78   Temp 98.1 F (36.7 C) (Axillary)   Resp 18   SpO2 97%  Gen:   Awake, no distress   Resp:  Normal effort  MSK:   Moves extremities without difficulty  Other:  Active bleeding throughout his entire mouth, able to contain with gauze and pressure.  Medical Decision Making  Medically screening exam initiated at 10:16 PM.  Appropriate orders placed.  Levi Hodges was informed that the remainder of the evaluation will be completed by another provider, this initial triage assessment does not replace that evaluation, and the importance of remaining in the ED until their evaluation is complete.     Janeece Fitting, PA-C 07/29/22 2221

## 2022-07-29 NOTE — ED Notes (Signed)
Pt seen walking out room toward exit, When spoken to would not acknowledge staff. Pt advised to stay by this Probation officer. Pt continued to walk out to exit and not come sign AMA paperwork and speak with provider.

## 2022-07-29 NOTE — ED Notes (Signed)
Attempted to obtain Blood work, pt uncooperative at this time.

## 2022-07-29 NOTE — ED Triage Notes (Signed)
Pt reports having 21 teeth pulled on Wednesday and started bleeding/clotting non stop since 0800.

## 2022-07-29 NOTE — ED Notes (Signed)
UNABLE TO OBTAIN TEMP DUE TO BLOODY GAUZE IN MOUTH

## 2022-07-29 NOTE — ED Triage Notes (Signed)
Patient arrives ambulatory by POV with wife. Report having 21 teeth removed here on 9/20. States bleeding started this morning and has not stopped. Patient is dialysis patient with last treatment yesterday. Took pain medicine around 8am today.

## 2022-07-29 NOTE — ED Notes (Signed)
Second nurse attempted to get blood work, pt pulled away and refused any other attempts.

## 2022-07-29 NOTE — ED Provider Triage Note (Signed)
Emergency Medicine Provider Triage Evaluation Note  Levi Hodges , a 46 y.o. male  was evaluated in triage.  Pt complains of patient presents for postop complication.  Patient reports he had 20 teeth removed last week.  Patient reports that he woke up this morning with bleeding in his mouth.  Patient denies any significant pain.  No lightheadedness or dizziness.  Patient is not on blood thinners..  Review of Systems  Positive: Bleeding gums Negative: Lightheadedness or dizziness  Physical Exam  BP (!) 150/76 (BP Location: Right Arm)   Pulse 71   Resp 16   Ht '5\' 11"'$  (1.803 m)   Wt 67.1 kg   SpO2 99%   BMI 20.64 kg/m  Gen:   Awake, no distress   Resp:  Normal effort  MSK:   Moves extremities without difficulty  Other:  Patient with oozing venous bleeding in the mouth where teeth were pulled for the upper and lower gums.  Medical Decision Making  Medically screening exam initiated at 3:36 PM.  Appropriate orders placed.  Levi Hodges was informed that the remainder of the evaluation will be completed by another provider, this initial triage assessment does not replace that evaluation, and the importance of remaining in the ED until their evaluation is complete.  Patient with postop complication with bleeding gums from tooth extraction.  Patient vital signs reassuring.  Patient is not hypotensive or tachycardic.  He has been applying pressure with gauze.  Will apply TXA soaked gauze to the mouth to help with bleeding.   Doristine Devoid, PA-C 07/29/22 1537

## 2022-07-29 NOTE — ED Provider Notes (Addendum)
[facility] EMERGENCY DEPARTMENT Provider Note   CSN: [id number] Arrival date & time: 07/29/22  1353     History  Chief Complaint  Patient presents with   Post-op Problem    Levi Hodges is a 46 y.o. male.  HPI   45 year old male with medical history significant for HTN, DM 2, ESRD on HD who presents emergency department with uncontrolled oral bleeding.  The patient states that he had multiple teeth removed surgically 6 days ago with Dr. Diona Browner of oral surgery.  Per the op note, the patient had " Nonrestorable teeth numbers 2, 3, 4, 5, 6, 7, 12, 14, 15, 19, 20, 21, 22, 23, 24, 25, 26, 27, 28, 29; impacted tooth #32.  Severe generalized periodontitis secondary to severe gingivitis."  These teeth were subsequently removed.  The patient states that postoperatively he was doing well and had minimal postoperative bleeding that subsequently resolved.  2 days ago he developed uncontrolled bleeding from multiple areas in his gums.  The bleeding persists currently.  He has been compliant with his dialysis.  He denies any other complaints at this time.    Home Medications Prior to Admission medications   Medication Sig Start Date End Date Taking? Authorizing Provider  amLODipine (NORVASC) 10 MG tablet Take 1 tablet (10 mg total) by mouth daily. 04/25/22   Hoyt Koch, MD  amoxicillin (AMOXIL) 500 MG capsule Take 1 capsule (500 mg total) by mouth 2 (two) times daily. 07/23/22   Diona Browner, DMD  blood glucose meter kit and supplies Dispense based on patient and insurance preference. Use up to four times daily as directed. (FOR ICD-10 E10.9, E11.9). 07/14/22   Hoyt Koch, MD  Blood Glucose Monitoring Suppl (ACCU-CHEK AVIVA PLUS) w/Device KIT Use up to 4 times a day 07/28/22   Hoyt Koch, MD  carvedilol (COREG) 6.25 MG tablet Take 1-2 tablets (6.25-12.5 mg total) by mouth 2 (two) times daily with a meal. 07/04/22   Hoyt Koch, MD   gabapentin (NEURONTIN) 100 MG capsule Take 100 mg by mouth 2 (two) times daily. 05/09/22   [provider]  glucose blood (ACCU-CHEK AVIVA PLUS) test strip Use as instructed up to 4 times per day 07/28/22   Hoyt Koch, MD  Insulin Pen Needle (PEN NEEDLES) 31G X 5 MM MISC 1 Package by Does not apply route daily. 04/15/19   [name], Melanie Crazier, MD  Lancets (ACCU-CHEK MULTICLIX) lancets Use as instructed up to 4 times a day 07/28/22   Hoyt Koch, MD  Multiple Vitamin (DAILY-VITE MULTIVITAMIN) TABS Take 1 tablet by mouth daily with supper. 06/04/22   [provider]  oxyCODONE-acetaminophen (PERCOCET) 5-325 MG tablet Take 1 tablet by mouth every 4 (four) hours as needed. 07/23/22   Diona Browner, DMD  sevelamer carbonate (RENVELA) 800 MG tablet Take 1 tablet (800 mg total) by mouth 3 (three) times daily with meals. 05/21/22   Hoyt Koch, MD  simvastatin (ZOCOR) 20 MG tablet TAKE 1 TABLET(20 MG) BY MOUTH AT BEDTIME 04/25/22   Hoyt Koch, MD      Allergies    Patient has no known allergies.    Review of Systems   Review of Systems  All other systems reviewed and are negative.   Physical Exam Updated Vital Signs BP (!) 168/87 (BP Location: Right Arm)   Pulse 65   Resp 16   Ht '5\' 11"'  (1.803 m)   Wt 67.1 kg   SpO2  100%   BMI 20.64 kg/m  Physical Exam Vitals and nursing note reviewed.  Constitutional:      General: He is not in acute distress. HENT:     Head: Normocephalic and atraumatic.     Mouth/Throat:     Comments: Sutures in place along the gum lines, multiple areas where sutures appear to have dehisced and active oozing is noted.  TXA soaked gauze administered. Eyes:     Conjunctiva/sclera: Conjunctivae normal.     Pupils: Pupils are equal, round, and reactive to light.  Cardiovascular:     Rate and Rhythm: Normal rate and regular rhythm.  Pulmonary:     Effort: Pulmonary effort is normal. No respiratory distress.   Abdominal:     General: There is no distension.     Tenderness: There is no guarding.  Musculoskeletal:        General: No deformity or signs of injury.     Cervical back: Neck supple.  Skin:    Findings: No lesion or rash.  Neurological:     General: No focal deficit present.     Mental Status: He is alert. Mental status is at baseline.     ED Results / Procedures / Treatments   Labs (all labs ordered are listed, but only abnormal results are displayed) Labs Reviewed  CBC WITH DIFFERENTIAL/PLATELET  BASIC METABOLIC PANEL  PROTIME-INR    EKG None  Radiology No results found.  Procedures Procedures    Medications Ordered in ED Medications  tranexamic acid (CYKLOKAPRON) 1000 MG/10ML topical solution 500 mg (500 mg Topical Given 07/29/22 1625)    ED Course/ Medical Decision Making/ A&P                           Medical Decision Making Amount and/or Complexity of Data Reviewed Labs: ordered.    46 year old male with medical history significant for HTN, DM 2, ESRD on HD who presents emergency department with uncontrolled oral bleeding.  The patient states that he had multiple teeth removed surgically 6 days ago with Dr. Diona Browner of oral surgery.  Per the op note, the patient had " Nonrestorable teeth numbers 2, 3, 4, 5, 6, 7, 12, 14, 15, 19, 20, 21, 22, 23, 24, 25, 26, 27, 28, 29; impacted tooth #32.  Severe generalized periodontitis secondary to severe gingivitis."  These teeth were subsequently removed.  The patient states that postoperatively he was doing well and had minimal postoperative bleeding that subsequently resolved.  2 days ago he developed uncontrolled bleeding from multiple areas in his gums.  The bleeding persists currently.  He has been compliant with his dialysis.  He denies any other complaints at this time.    On arrival, the patient was vitally stable.  Nursing initially unable to obtain a temperature.    Presenting with uncontrolled bleeding  with likely suture dehiscence in several sites along his gums.  On-call oral surgery paged, I did speak with Dr. Zelphia Cairo who recommended that that I paged the patient's oral surgeon, Dr. Diona Browner.  Dr. Hoyt Koch was paged.    Screening lab evaluation ordered to evaluate for patient hemoglobin and platelets, renal function given his history of ESRD.  Patient refused all attempts at blood work.  Was uncooperative and verbally abusive to nursing staff.  Dr. Hoyt Koch called back and while was on the phone with him I was informed by nursing that the patient had subsequently eloped from the emergency department  after an altercation with nursing staff while attempting to obtain labs.   Final Clinical Impression(s) / ED Diagnoses Final diagnoses:  Bleeding gums  History of dental surgery    Rx / DC Orders ED Discharge Orders     None         Regan Lemming, MD 07/29/22 Lanetta Inch    Regan Lemming, MD 07/29/22 1914

## 2022-07-30 DIAGNOSIS — K068 Other specified disorders of gingiva and edentulous alveolar ridge: Secondary | ICD-10-CM | POA: Diagnosis not present

## 2022-07-30 DIAGNOSIS — D509 Iron deficiency anemia, unspecified: Secondary | ICD-10-CM | POA: Diagnosis not present

## 2022-07-30 DIAGNOSIS — Z992 Dependence on renal dialysis: Secondary | ICD-10-CM | POA: Diagnosis not present

## 2022-07-30 DIAGNOSIS — D689 Coagulation defect, unspecified: Secondary | ICD-10-CM | POA: Diagnosis not present

## 2022-07-30 DIAGNOSIS — R52 Pain, unspecified: Secondary | ICD-10-CM | POA: Diagnosis not present

## 2022-07-30 DIAGNOSIS — D631 Anemia in chronic kidney disease: Secondary | ICD-10-CM | POA: Diagnosis not present

## 2022-07-30 DIAGNOSIS — N2581 Secondary hyperparathyroidism of renal origin: Secondary | ICD-10-CM | POA: Diagnosis not present

## 2022-07-30 DIAGNOSIS — N186 End stage renal disease: Secondary | ICD-10-CM | POA: Diagnosis not present

## 2022-07-30 LAB — CBG MONITORING, ED: Glucose-Capillary: 77 mg/dL (ref 70–99)

## 2022-07-30 NOTE — ED Provider Notes (Signed)
[facility] DEPT Provider Note  CSN: [id number] Arrival date & time: 07/29/22 2148  Chief Complaint(s) Post-op Problem  HPI Levi Hodges is a 46 y.o. male who presents to the emergency department for uncontrolled oral bleeding following total dental removal by Dr. Hoyt Koch on September 20.  Patient was actually seen at [facility] earlier in the evening and left prior to receiving labs.  He received TXA without any bleeding controlled.   Patient reports that for the first day or 2, after the surgery, he noted bleeding that resolved over the weekend.  Monday the patient went to dialysis.  Tuesday morning he noted recurrence of the bleeding which had not resolved until arriving to [facility].  Patient endorses chronic generalized fatigue but no change from his baseline.  No chest pain or shortness of breath.  No lightheadedness.  No dyspnea on exertion.  The history is provided by the patient.    Past Medical History Past Medical History:  Diagnosis Date   Anemia    Depression    Diabetes mellitus without complication ([place])    Type II- no meds   Hypertension    Renal disorder    Patient Active Problem List   Diagnosis Date Noted   Numbness in both legs 04/03/2022   ESRD (end stage renal disease) ([place]) 03/24/2022   Normocytic anemia 03/24/2022   Swelling of lower extremity 03/24/2022   Pulmonary nodule 03/24/2022   Well controlled type 2 diabetes mellitus ([place]) 03/21/2019   Low back pain 03/11/2019   Patient's intentional underdosing of medication regimen due to financial hardship 08/27/2018   Hyperlipidemia associated with type 2 diabetes mellitus ([place]) 01/05/2017   Essential hypertension 01/05/2017   Tobacco abuse 01/05/2017   ED (erectile dysfunction) 01/05/2017   Home Medication(s) Prior to Admission medications   Medication Sig Start Date End Date Taking? Authorizing Provider  amLODipine (NORVASC) 10 MG tablet Take 1 tablet (10 mg  total) by mouth daily. 04/25/22   Hoyt Koch, MD  amoxicillin (AMOXIL) 500 MG capsule Take 1 capsule (500 mg total) by mouth 2 (two) times daily. 07/23/22   Diona Browner, DMD  blood glucose meter kit and supplies Dispense based on patient and insurance preference. Use up to four times daily as directed. (FOR ICD-10 E10.9, E11.9). 07/14/22   Hoyt Koch, MD  Blood Glucose Monitoring Suppl (ACCU-CHEK AVIVA PLUS) w/Device KIT Use up to 4 times a day 07/28/22   Hoyt Koch, MD  carvedilol (COREG) 6.25 MG tablet Take 1-2 tablets (6.25-12.5 mg total) by mouth 2 (two) times daily with a meal. 07/04/22   Hoyt Koch, MD  gabapentin (NEURONTIN) 100 MG capsule Take 100 mg by mouth 2 (two) times daily. 05/09/22   [provider]  glucose blood (ACCU-CHEK AVIVA PLUS) test strip Use as instructed up to 4 times per day 07/28/22   Hoyt Koch, MD  Insulin Pen Needle (PEN NEEDLES) 31G X 5 MM MISC 1 Package by Does not apply route daily. 04/15/19   [name], Melanie Crazier, MD  Lancets (ACCU-CHEK MULTICLIX) lancets Use as instructed up to 4 times a day 07/28/22   Hoyt Koch, MD  Multiple Vitamin (DAILY-VITE MULTIVITAMIN) TABS Take 1 tablet by mouth daily with supper. 06/04/22   [provider]  oxyCODONE-acetaminophen (PERCOCET) 5-325 MG tablet Take 1 tablet by mouth every 4 (four) hours as needed. 07/23/22   Diona Browner, DMD  sevelamer carbonate (RENVELA) 800 MG tablet Take 1 tablet (800  mg total) by mouth 3 (three) times daily with meals. 05/21/22   Hoyt Koch, MD  simvastatin (ZOCOR) 20 MG tablet TAKE 1 TABLET(20 MG) BY MOUTH AT BEDTIME 04/25/22   Hoyt Koch, MD                                                                                                                                    Allergies Patient has no known allergies.  Review of Systems Review of Systems As noted in HPI  Physical Exam Vital Signs  I  have reviewed the triage vital signs BP (!) 148/85   Pulse 77   Temp 98.1 F (36.7 C) (Axillary)   Resp 16   SpO2 100%   Physical Exam Vitals reviewed.  Constitutional:      General: He is not in acute distress.    Appearance: He is well-developed. He is not diaphoretic.  HENT:     Head: Normocephalic and atraumatic.     Right Ear: External ear normal.     Left Ear: External ear normal.     Nose: Nose normal.     Mouth/Throat:     Mouth: Mucous membranes are moist.     Comments: Total dental extraction with sutures in place in upper and lower gums. Right upper gingiva is less approximated than rest of gums. No active bleeding. No erythema. No discharge. Eyes:     General: No scleral icterus.    Conjunctiva/sclera: Conjunctivae normal.  Neck:     Trachea: Phonation normal.  Cardiovascular:     Rate and Rhythm: Normal rate and regular rhythm.  Pulmonary:     Effort: Pulmonary effort is normal. No respiratory distress.     Breath sounds: No stridor.  Abdominal:     General: There is no distension.  Musculoskeletal:        General: Normal range of motion.     Cervical back: Normal range of motion.  Neurological:     Mental Status: He is alert and oriented to person, place, and time.  Psychiatric:        Behavior: Behavior normal.     ED Results and Treatments Labs (all labs ordered are listed, but only abnormal results are displayed) Labs Reviewed  CBC WITH DIFFERENTIAL/PLATELET - Abnormal; Notable for the following components:      Result Value   RBC 3.10 (*)    Hemoglobin 8.5 (*)    HCT 26.8 (*)    RDW 15.9 (*)    All other components within normal limits  COMPREHENSIVE METABOLIC PANEL - Abnormal; Notable for the following components:   Chloride 96 (*)    Glucose, Bld 112 (*)    BUN 29 (*)    Creatinine, Ser 9.27 (*)    GFR, Estimated 6 (*)    All other components within normal limits  CBG MONITORING, ED  EKG  EKG Interpretation  Date/Time:    Ventricular Rate:    PR Interval:    QRS Duration:   QT Interval:    QTC Calculation:   R Axis:     Text Interpretation:         Radiology No results found.  Medications Ordered in ED Medications - No data to display                                                                                                                                   Procedures Procedures  (including critical care time)  Medical Decision Making / ED Course   Medical Decision Making Amount and/or Complexity of Data Reviewed External Data Reviewed: notes.    Details: From [facility] ER visit noted in HPI Labs: ordered. Decision-making details documented in ED Course.    Postoperative bleeding of Total dental extraction.  Mostly from the right upper gingiva which is less approximated than the rest. No evidence of obvious infection. Bleeding is now controlled. Given the timing, there is a possibility that the heparin bolus from dialysis might have played a role in the rebleed.  This versus unintentional agitation of the clot while sleeping.  Patient allowed [place] to obtain labs which showed: CBC without leukocytosis.  Hemoglobin of 8.5 which is a 4 g drop from 7 days ago. Metabolic panel is consistent with ESRD.  Discussed option for blood transfusion but patient declined at this time.  He states that he will monitor closely for any rebleeds or symptoms associated to anemia.  Patient already has prescribed antibiotics from the dentist which he will complete in 2 days.  He is on a mechanical soft diet which she is keeping.  Oral surgery follow-up as scheduled. Strict return precautions given.       Final Clinical Impression(s) / ED Diagnoses Final diagnoses:  Bleeding from mouth   The patient appears reasonably screened and/or stabilized for discharge and I doubt any other medical condition  or other [facility] requiring further screening, evaluation, or treatment in the ED at this time. I have discussed the findings, Dx and Tx plan with the patient/family who expressed understanding and agree(s) with the plan. Discharge instructions discussed at length. The patient/family was given strict return precautions who verbalized understanding of the instructions. No further questions at time of discharge.  Disposition: Discharge  Condition: Good  ED Discharge Orders     None        Follow Up: Diona Browner, DMD [place] [address] [phone]  Call  as needed  Hoyt Koch, [place] [address] [phone]  Call  to schedule an appointment for close follow up to recheck hemoglobin in 1-2 weeks            This chart was dictated using voice recognition software.  Despite best efforts to proofread,  errors can occur which can change  the documentation meaning.    Fatima Blank, MD 07/30/22 [phone]

## 2022-08-01 DIAGNOSIS — R52 Pain, unspecified: Secondary | ICD-10-CM | POA: Diagnosis not present

## 2022-08-01 DIAGNOSIS — D509 Iron deficiency anemia, unspecified: Secondary | ICD-10-CM | POA: Diagnosis not present

## 2022-08-01 DIAGNOSIS — N186 End stage renal disease: Secondary | ICD-10-CM | POA: Diagnosis not present

## 2022-08-01 DIAGNOSIS — D631 Anemia in chronic kidney disease: Secondary | ICD-10-CM | POA: Diagnosis not present

## 2022-08-01 DIAGNOSIS — D689 Coagulation defect, unspecified: Secondary | ICD-10-CM | POA: Diagnosis not present

## 2022-08-01 DIAGNOSIS — N2581 Secondary hyperparathyroidism of renal origin: Secondary | ICD-10-CM | POA: Diagnosis not present

## 2022-08-01 DIAGNOSIS — Z992 Dependence on renal dialysis: Secondary | ICD-10-CM | POA: Diagnosis not present

## 2022-08-02 DIAGNOSIS — Z992 Dependence on renal dialysis: Secondary | ICD-10-CM | POA: Diagnosis not present

## 2022-08-02 DIAGNOSIS — N186 End stage renal disease: Secondary | ICD-10-CM | POA: Diagnosis not present

## 2022-08-02 DIAGNOSIS — E1122 Type 2 diabetes mellitus with diabetic chronic kidney disease: Secondary | ICD-10-CM | POA: Diagnosis not present

## 2022-08-04 DIAGNOSIS — D509 Iron deficiency anemia, unspecified: Secondary | ICD-10-CM | POA: Diagnosis not present

## 2022-08-04 DIAGNOSIS — D689 Coagulation defect, unspecified: Secondary | ICD-10-CM | POA: Diagnosis not present

## 2022-08-04 DIAGNOSIS — N2581 Secondary hyperparathyroidism of renal origin: Secondary | ICD-10-CM | POA: Diagnosis not present

## 2022-08-04 DIAGNOSIS — N186 End stage renal disease: Secondary | ICD-10-CM | POA: Diagnosis not present

## 2022-08-04 DIAGNOSIS — Z992 Dependence on renal dialysis: Secondary | ICD-10-CM | POA: Diagnosis not present

## 2022-08-04 DIAGNOSIS — D631 Anemia in chronic kidney disease: Secondary | ICD-10-CM | POA: Diagnosis not present

## 2022-08-08 DIAGNOSIS — N2581 Secondary hyperparathyroidism of renal origin: Secondary | ICD-10-CM | POA: Diagnosis not present

## 2022-08-08 DIAGNOSIS — D689 Coagulation defect, unspecified: Secondary | ICD-10-CM | POA: Diagnosis not present

## 2022-08-08 DIAGNOSIS — D631 Anemia in chronic kidney disease: Secondary | ICD-10-CM | POA: Diagnosis not present

## 2022-08-08 DIAGNOSIS — Z992 Dependence on renal dialysis: Secondary | ICD-10-CM | POA: Diagnosis not present

## 2022-08-08 DIAGNOSIS — N186 End stage renal disease: Secondary | ICD-10-CM | POA: Diagnosis not present

## 2022-08-08 DIAGNOSIS — D509 Iron deficiency anemia, unspecified: Secondary | ICD-10-CM | POA: Diagnosis not present

## 2022-08-11 ENCOUNTER — Telehealth: Payer: Self-pay | Admitting: Internal Medicine

## 2022-08-11 DIAGNOSIS — D631 Anemia in chronic kidney disease: Secondary | ICD-10-CM | POA: Diagnosis not present

## 2022-08-11 DIAGNOSIS — N186 End stage renal disease: Secondary | ICD-10-CM | POA: Diagnosis not present

## 2022-08-11 DIAGNOSIS — Z992 Dependence on renal dialysis: Secondary | ICD-10-CM | POA: Diagnosis not present

## 2022-08-11 DIAGNOSIS — N2581 Secondary hyperparathyroidism of renal origin: Secondary | ICD-10-CM | POA: Diagnosis not present

## 2022-08-11 DIAGNOSIS — D689 Coagulation defect, unspecified: Secondary | ICD-10-CM | POA: Diagnosis not present

## 2022-08-11 DIAGNOSIS — D509 Iron deficiency anemia, unspecified: Secondary | ICD-10-CM | POA: Diagnosis not present

## 2022-08-11 NOTE — Telephone Encounter (Signed)
[place] [facility] called and would like to request that patients medications be sent in for 90 days instead of just 30 - please send to [facility] on [address]

## 2022-08-13 DIAGNOSIS — D509 Iron deficiency anemia, unspecified: Secondary | ICD-10-CM | POA: Diagnosis not present

## 2022-08-13 DIAGNOSIS — D689 Coagulation defect, unspecified: Secondary | ICD-10-CM | POA: Diagnosis not present

## 2022-08-13 DIAGNOSIS — D631 Anemia in chronic kidney disease: Secondary | ICD-10-CM | POA: Diagnosis not present

## 2022-08-13 DIAGNOSIS — N2581 Secondary hyperparathyroidism of renal origin: Secondary | ICD-10-CM | POA: Diagnosis not present

## 2022-08-13 DIAGNOSIS — Z992 Dependence on renal dialysis: Secondary | ICD-10-CM | POA: Diagnosis not present

## 2022-08-13 DIAGNOSIS — N186 End stage renal disease: Secondary | ICD-10-CM | POA: Diagnosis not present

## 2022-08-15 DIAGNOSIS — Z992 Dependence on renal dialysis: Secondary | ICD-10-CM | POA: Diagnosis not present

## 2022-08-15 DIAGNOSIS — N2581 Secondary hyperparathyroidism of renal origin: Secondary | ICD-10-CM | POA: Diagnosis not present

## 2022-08-15 DIAGNOSIS — N186 End stage renal disease: Secondary | ICD-10-CM | POA: Diagnosis not present

## 2022-08-15 DIAGNOSIS — D689 Coagulation defect, unspecified: Secondary | ICD-10-CM | POA: Diagnosis not present

## 2022-08-15 DIAGNOSIS — D509 Iron deficiency anemia, unspecified: Secondary | ICD-10-CM | POA: Diagnosis not present

## 2022-08-15 DIAGNOSIS — D631 Anemia in chronic kidney disease: Secondary | ICD-10-CM | POA: Diagnosis not present

## 2022-08-18 DIAGNOSIS — D689 Coagulation defect, unspecified: Secondary | ICD-10-CM | POA: Diagnosis not present

## 2022-08-18 DIAGNOSIS — D631 Anemia in chronic kidney disease: Secondary | ICD-10-CM | POA: Diagnosis not present

## 2022-08-18 DIAGNOSIS — N186 End stage renal disease: Secondary | ICD-10-CM | POA: Diagnosis not present

## 2022-08-18 DIAGNOSIS — Z992 Dependence on renal dialysis: Secondary | ICD-10-CM | POA: Diagnosis not present

## 2022-08-18 DIAGNOSIS — D509 Iron deficiency anemia, unspecified: Secondary | ICD-10-CM | POA: Diagnosis not present

## 2022-08-18 DIAGNOSIS — N2581 Secondary hyperparathyroidism of renal origin: Secondary | ICD-10-CM | POA: Diagnosis not present

## 2022-08-20 DIAGNOSIS — D631 Anemia in chronic kidney disease: Secondary | ICD-10-CM | POA: Diagnosis not present

## 2022-08-20 DIAGNOSIS — D689 Coagulation defect, unspecified: Secondary | ICD-10-CM | POA: Diagnosis not present

## 2022-08-20 DIAGNOSIS — Z992 Dependence on renal dialysis: Secondary | ICD-10-CM | POA: Diagnosis not present

## 2022-08-20 DIAGNOSIS — N186 End stage renal disease: Secondary | ICD-10-CM | POA: Diagnosis not present

## 2022-08-20 DIAGNOSIS — D509 Iron deficiency anemia, unspecified: Secondary | ICD-10-CM | POA: Diagnosis not present

## 2022-08-20 DIAGNOSIS — N2581 Secondary hyperparathyroidism of renal origin: Secondary | ICD-10-CM | POA: Diagnosis not present

## 2022-08-22 DIAGNOSIS — D689 Coagulation defect, unspecified: Secondary | ICD-10-CM | POA: Diagnosis not present

## 2022-08-22 DIAGNOSIS — D509 Iron deficiency anemia, unspecified: Secondary | ICD-10-CM | POA: Diagnosis not present

## 2022-08-22 DIAGNOSIS — D631 Anemia in chronic kidney disease: Secondary | ICD-10-CM | POA: Diagnosis not present

## 2022-08-22 DIAGNOSIS — N2581 Secondary hyperparathyroidism of renal origin: Secondary | ICD-10-CM | POA: Diagnosis not present

## 2022-08-22 DIAGNOSIS — N186 End stage renal disease: Secondary | ICD-10-CM | POA: Diagnosis not present

## 2022-08-22 DIAGNOSIS — Z992 Dependence on renal dialysis: Secondary | ICD-10-CM | POA: Diagnosis not present

## 2022-08-25 DIAGNOSIS — D509 Iron deficiency anemia, unspecified: Secondary | ICD-10-CM | POA: Diagnosis not present

## 2022-08-25 DIAGNOSIS — Z992 Dependence on renal dialysis: Secondary | ICD-10-CM | POA: Diagnosis not present

## 2022-08-25 DIAGNOSIS — N186 End stage renal disease: Secondary | ICD-10-CM | POA: Diagnosis not present

## 2022-08-25 DIAGNOSIS — D631 Anemia in chronic kidney disease: Secondary | ICD-10-CM | POA: Diagnosis not present

## 2022-08-25 DIAGNOSIS — D689 Coagulation defect, unspecified: Secondary | ICD-10-CM | POA: Diagnosis not present

## 2022-08-25 DIAGNOSIS — N2581 Secondary hyperparathyroidism of renal origin: Secondary | ICD-10-CM | POA: Diagnosis not present

## 2022-08-27 ENCOUNTER — Encounter (HOSPITAL_COMMUNITY): Payer: Self-pay | Admitting: Vascular Surgery

## 2022-08-27 ENCOUNTER — Other Ambulatory Visit: Payer: Self-pay

## 2022-08-27 DIAGNOSIS — N2581 Secondary hyperparathyroidism of renal origin: Secondary | ICD-10-CM | POA: Diagnosis not present

## 2022-08-27 DIAGNOSIS — D509 Iron deficiency anemia, unspecified: Secondary | ICD-10-CM | POA: Diagnosis not present

## 2022-08-27 DIAGNOSIS — N186 End stage renal disease: Secondary | ICD-10-CM | POA: Diagnosis not present

## 2022-08-27 DIAGNOSIS — D631 Anemia in chronic kidney disease: Secondary | ICD-10-CM | POA: Diagnosis not present

## 2022-08-27 DIAGNOSIS — Z992 Dependence on renal dialysis: Secondary | ICD-10-CM | POA: Diagnosis not present

## 2022-08-27 DIAGNOSIS — D689 Coagulation defect, unspecified: Secondary | ICD-10-CM | POA: Diagnosis not present

## 2022-08-27 NOTE — Progress Notes (Signed)
PCP - Dr. Sharlet Salina  Cardiologist - Denies  EP- Denies  Endocrine- Denies  Pulm- Denies  Chest x-ray - 03/26/22 (E)  EKG - 03/24/22 (E)  Stress Test - Denies  ECHO - 03/24/22 (E)  Cardiac Cath - 03/24/22 (E)  AICD-na PM-na LOOP-na  Nerve Stimulator- Denies  Dialysis- M-W-F, pt on the way to his appt  Sleep Study - Denies CPAP - Denies  LABS- 08/28/22: I-Stat 8  ASA- Denies  ERAS- No  HA1C- 03/24/22(E): 5.4 Fasting Blood Sugar - 105-190 Checks Blood Sugar __1___ time a week  Anesthesia- No  Pt denies having chest pain, sob, or fever during the pre-op phone call. All instructions explained to the pt, with a verbal understanding of the material. Pt also instructed to wear a mask and social distance if he goes out. The opportunity to ask questions was provided.

## 2022-08-27 NOTE — Progress Notes (Signed)
S.D.W- Instructions   Your procedure is scheduled on Thurs., Oct. 26, 2023 from 7:30AM-9:45AM.  Report to [facility] Main Entrance "A" at 5:30 A.M., then check in with the Admitting office.  Call this number if you have problems the morning of surgery:  [phone]   Remember:  Do not eat or drink after midnight on Oct. 25th    Take these medicines the morning of surgery with A SIP OF WATER: AmLODipine (NORVASC) Carvedilol (COREG) Gabapentin (NEURONTIN)  As of today, STOP taking any Aspirin (unless otherwise instructed by your surgeon) Aleve, Naproxen, Ibuprofen, Motrin, Advil, Goody's, BC's, all herbal medications, fish oil, and all vitamins.  How to Manage Your Diabetes Before and After Surgery  How do I manage my blood sugar before surgery? Check your blood sugar at least 4 times a day, starting 2 days before surgery, to make sure that the level is not too high or low. Check your blood sugar the morning of your surgery when you wake up and every 2 hours until you get to the Short Stay unit. If your blood sugar is less than 70 mg/dL, you will need to treat for low blood sugar: Do not take insulin. Treat a low blood sugar (less than 70 mg/dL) with  cup of clear juice (cranberry or apple), 4 glucose tablets, OR glucose gel. Recheck blood sugar in 15 minutes after treatment (to make sure it is greater than 70 mg/dL). If your blood sugar is not greater than 70 mg/dL on recheck, call [phone]  for further instructions. Report your blood sugar to the short stay nurse when you get to Short Stay.  If your CBG is greater than 220 mg/dL, inform the staff upon arrival to Short Stay.  Reviewed and Endorsed by [facility] Patient Education Committee, August 2015  Do not wear jewelry. Do not wear lotions, powders, cologne or deodorant. Do not shave 48 hours prior to surgery.  Men may shave face and neck. Do not bring valuables to the hospital.  [facility] is not responsible for any  belongings or valuables.    Do NOT Smoke (Tobacco/Vaping)  24 hours prior to your procedure  If you use a CPAP at night, you may bring your mask for your overnight stay.   Contacts, glasses, hearing aids, dentures or partials may not be worn into surgery, please bring cases for these belongings   For patients admitted to the hospital, discharge time will be determined by your treatment team.   Patients discharged the day of surgery will not be allowed to drive home, and someone needs to stay with them for 24 hours.  Special instructions:    Oral Hygiene is also important to reduce your risk of infection.  Remember - BRUSH YOUR TEETH THE MORNING OF SURGERY WITH YOUR REGULAR TOOTHPASTE  [place]- Preparing For Surgery  Before surgery, you can play an important role. Because skin is not sterile, your skin needs to be as free of germs as possible. You can reduce the number of germs on your skin by washing with Antibacterial Soap before surgery.     Please follow these instructions carefully.     Shower the NIGHT BEFORE SURGERY and the MORNING OF SURGERY with Antibacterial Soap.   Pat yourself dry with a CLEAN TOWEL.  Wear CLEAN PAJAMAS to bed the night before surgery  Place CLEAN SHEETS on your bed the night before your surgery  DO NOT SLEEP WITH PETS.  Day of Surgery:  Take a shower with  Antibacterial soap. Wear Clean/Comfortable clothing the morning of surgery Do not apply any deodorants/lotions.   Remember to brush your teeth WITH YOUR REGULAR TOOTHPASTE.   If you test positive for Covid, or been in contact with anyone that has tested positive in the last 10 days, please notify your surgeon.  SURGICAL WAITING ROOM VISITATION Patients having surgery or a procedure may have no more than 2 support people in the waiting area - these visitors may rotate.   Children under the age of 9 must have an adult with them who is not the patient. If the patient needs to stay at the  hospital during part of their recovery, the visitor guidelines for inpatient rooms apply. Pre-op nurse will coordinate an appropriate time for 1 support person to accompany patient in pre-op.  This support person may not rotate.   Please refer to the [facility] website for the visitor guidelines for Inpatients (after your surgery is over and you are in a regular room).

## 2022-08-27 NOTE — Anesthesia Preprocedure Evaluation (Signed)
Anesthesia Evaluation  Patient identified by MRN, date of birth, ID band Patient awake    Reviewed: Allergy & Precautions, NPO status , Patient's Chart, lab work & pertinent test results, reviewed documented beta blocker date and time   History of Anesthesia Complications Negative for: history of anesthetic complications  Airway Mallampati: II  TM Distance: >3 FB Neck ROM: Full    Dental  (+) Dental Advisory Given   Pulmonary Current SmokerPatient did not abstain from smoking.,    Pulmonary exam normal        Cardiovascular hypertension, Pt. on medications and Pt. on home beta blockers Normal cardiovascular exam     Neuro/Psych PSYCHIATRIC DISORDERS Depression negative neurological ROS     GI/Hepatic negative GI ROS, Neg liver ROS,   Endo/Other  diabetes, Type 2  Renal/GU ESRF and DialysisRenal disease     Musculoskeletal negative musculoskeletal ROS (+)   Abdominal   Peds  Hematology negative hematology ROS (+)   Anesthesia Other Findings   Reproductive/Obstetrics                            Anesthesia Physical Anesthesia Plan  ASA: 3  Anesthesia Plan: Regional   Post-op Pain Management: Regional block* and Tylenol PO (pre-op)*   Induction:   PONV Risk Score and Plan: 1 and Propofol infusion and Treatment may vary due to age or medical condition  Airway Management Planned: Natural Airway and Simple Face Mask  Additional Equipment: None  Intra-op Plan:   Post-operative Plan:   Informed Consent: I have reviewed the patients History and Physical, chart, labs and discussed the procedure including the risks, benefits and alternatives for the proposed anesthesia with the patient or authorized representative who has indicated his/her understanding and acceptance.       Plan Discussed with: CRNA and Anesthesiologist  Anesthesia Plan Comments:        Anesthesia Quick  Evaluation

## 2022-08-28 ENCOUNTER — Other Ambulatory Visit: Payer: Self-pay

## 2022-08-28 ENCOUNTER — Telehealth: Payer: Self-pay

## 2022-08-28 ENCOUNTER — Ambulatory Visit (HOSPITAL_COMMUNITY)
Admission: RE | Admit: 2022-08-28 | Discharge: 2022-08-28 | Disposition: A | Discharge status: Home / Self Care | Payer: Medicare Other | Source: Ambulatory Visit | Attending: Vascular Surgery | Admitting: Vascular Surgery

## 2022-08-28 ENCOUNTER — Encounter (HOSPITAL_COMMUNITY)
Admission: RE | Disposition: A | Discharge status: Home / Self Care | Payer: Self-pay | Source: Ambulatory Visit | Attending: Vascular Surgery

## 2022-08-28 ENCOUNTER — Ambulatory Visit (HOSPITAL_BASED_OUTPATIENT_CLINIC_OR_DEPARTMENT_OTHER): Payer: Medicare Other | Admitting: Anesthesiology

## 2022-08-28 ENCOUNTER — Ambulatory Visit (HOSPITAL_COMMUNITY): Payer: Medicare Other | Admitting: Anesthesiology

## 2022-08-28 DIAGNOSIS — Z992 Dependence on renal dialysis: Secondary | ICD-10-CM

## 2022-08-28 DIAGNOSIS — F17210 Nicotine dependence, cigarettes, uncomplicated: Secondary | ICD-10-CM

## 2022-08-28 DIAGNOSIS — E1122 Type 2 diabetes mellitus with diabetic chronic kidney disease: Secondary | ICD-10-CM

## 2022-08-28 DIAGNOSIS — N186 End stage renal disease: Secondary | ICD-10-CM | POA: Diagnosis not present

## 2022-08-28 DIAGNOSIS — I120 Hypertensive chronic kidney disease with stage 5 chronic kidney disease or end stage renal disease: Secondary | ICD-10-CM | POA: Insufficient documentation

## 2022-08-28 DIAGNOSIS — N185 Chronic kidney disease, stage 5: Secondary | ICD-10-CM | POA: Diagnosis not present

## 2022-08-28 DIAGNOSIS — F17200 Nicotine dependence, unspecified, uncomplicated: Secondary | ICD-10-CM | POA: Diagnosis not present

## 2022-08-28 HISTORY — PX: BASCILIC VEIN TRANSPOSITION: SHX5742

## 2022-08-28 LAB — POCT I-STAT, CHEM 8
BUN: 14 mg/dL (ref 6–20)
Calcium, Ion: 1.02 mmol/L — ABNORMAL LOW (ref 1.15–1.40)
Chloride: 95 mmol/L — ABNORMAL LOW (ref 98–111)
Creatinine, Ser: 6.5 mg/dL — ABNORMAL HIGH (ref 0.61–1.24)
Glucose, Bld: 93 mg/dL (ref 70–99)
HCT: 36 % — ABNORMAL LOW (ref 39.0–52.0)
Hemoglobin: 12.2 g/dL — ABNORMAL LOW (ref 13.0–17.0)
Potassium: 3.9 mmol/L (ref 3.5–5.1)
Sodium: 139 mmol/L (ref 135–145)
TCO2: 33 mmol/L — ABNORMAL HIGH (ref 22–32)

## 2022-08-28 LAB — GLUCOSE, CAPILLARY
Glucose-Capillary: 96 mg/dL (ref 70–99)
Glucose-Capillary: 85 mg/dL (ref 70–99)

## 2022-08-28 SURGERY — TRANSPOSITION, VEIN, BASILIC
Anesthesia: Monitor Anesthesia Care | Laterality: Left

## 2022-08-28 MED ORDER — PROPOFOL 1000 MG/100ML IV EMUL
INTRAVENOUS | Status: AC
  Filled 2022-08-28: qty 100

## 2022-08-28 MED ORDER — OXYCODONE HCL 5 MG/5ML PO SOLN: 5.0000 mg | Freq: Once | ORAL | Status: DC | PRN

## 2022-08-28 MED ORDER — FENTANYL CITRATE (PF) 250 MCG/5ML IJ SOLN
INTRAMUSCULAR | Status: DC | PRN
  Administered 2022-08-28 (×2): 50 ug via INTRAVENOUS

## 2022-08-28 MED ORDER — KETAMINE HCL 10 MG/ML IJ SOLN
INTRAMUSCULAR | Status: DC | PRN
  Administered 2022-08-28: 20 mg via INTRAVENOUS

## 2022-08-28 MED ORDER — HEPARIN 6000 UNIT IRRIGATION SOLUTION
Status: DC | PRN
  Administered 2022-08-28: 1

## 2022-08-28 MED ORDER — CHLORHEXIDINE GLUCONATE 0.12 % MT SOLN: 15.0000 mL | Freq: Once | OROMUCOSAL | Status: AC

## 2022-08-28 MED ORDER — MIDAZOLAM HCL 2 MG/2ML IJ SOLN
INTRAMUSCULAR | Status: DC | PRN
  Administered 2022-08-28 (×2): 1 mg via INTRAVENOUS

## 2022-08-28 MED ORDER — KETAMINE HCL 50 MG/5ML IJ SOSY
PREFILLED_SYRINGE | INTRAMUSCULAR | Status: AC
  Filled 2022-08-28: qty 5

## 2022-08-28 MED ORDER — INSULIN ASPART 100 UNIT/ML IJ SOLN: 0.0000 [IU] | INTRAMUSCULAR | Status: DC | PRN

## 2022-08-28 MED ORDER — CHLORHEXIDINE GLUCONATE 4 % EX LIQD: 60.0000 mL | Freq: Once | CUTANEOUS | Status: DC

## 2022-08-28 MED ORDER — PROMETHAZINE HCL 25 MG/ML IJ SOLN: 6.2500 mg | INTRAMUSCULAR | Status: DC | PRN

## 2022-08-28 MED ORDER — ACETAMINOPHEN 500 MG PO TABS: 1000.0000 mg | ORAL_TABLET | Freq: Once | ORAL | Status: AC

## 2022-08-28 MED ORDER — ACETAMINOPHEN 500 MG PO TABS
ORAL_TABLET | ORAL | Status: AC
  Administered 2022-08-28: 1000 mg via ORAL
  Filled 2022-08-28: qty 2

## 2022-08-28 MED ORDER — FENTANYL CITRATE (PF) 250 MCG/5ML IJ SOLN
INTRAMUSCULAR | Status: AC
  Filled 2022-08-28: qty 5

## 2022-08-28 MED ORDER — HYDROCODONE-ACETAMINOPHEN 5-325 MG PO TABS: 1.0000 | ORAL_TABLET | Freq: Four times a day (QID) | ORAL | 0 refills | Status: DC | PRN

## 2022-08-28 MED ORDER — 0.9 % SODIUM CHLORIDE (POUR BTL) OPTIME
TOPICAL | Status: DC | PRN
  Administered 2022-08-28: 1000 mL

## 2022-08-28 MED ORDER — ORAL CARE MOUTH RINSE: 15.0000 mL | Freq: Once | OROMUCOSAL | Status: AC

## 2022-08-28 MED ORDER — SODIUM CHLORIDE 0.9 % IV SOLN: INTRAVENOUS | Status: DC

## 2022-08-28 MED ORDER — GLYCOPYRROLATE PF 0.2 MG/ML IJ SOSY
PREFILLED_SYRINGE | INTRAMUSCULAR | Status: AC
  Filled 2022-08-28: qty 1

## 2022-08-28 MED ORDER — OXYCODONE HCL 5 MG PO TABS: 5.0000 mg | ORAL_TABLET | Freq: Once | ORAL | Status: DC | PRN

## 2022-08-28 MED ORDER — PROPOFOL 500 MG/50ML IV EMUL
INTRAVENOUS | Status: DC | PRN
  Administered 2022-08-28: 75 ug/kg/min via INTRAVENOUS

## 2022-08-28 MED ORDER — MEPIVACAINE HCL (PF) 1.5 % IJ SOLN
INTRAMUSCULAR | Status: DC | PRN
  Administered 2022-08-28: 20 mL via PERINEURAL

## 2022-08-28 MED ORDER — HEPARIN 6000 UNIT IRRIGATION SOLUTION
Status: AC
  Filled 2022-08-28: qty 500

## 2022-08-28 MED ORDER — CHLORHEXIDINE GLUCONATE 0.12 % MT SOLN
OROMUCOSAL | Status: AC
  Administered 2022-08-28: 15 mL via OROMUCOSAL
  Filled 2022-08-28: qty 15

## 2022-08-28 MED ORDER — CEFAZOLIN SODIUM-DEXTROSE 2-4 GM/100ML-% IV SOLN
INTRAVENOUS | Status: AC
  Filled 2022-08-28: qty 100

## 2022-08-28 MED ORDER — LIDOCAINE-EPINEPHRINE (PF) 1 %-1:200000 IJ SOLN
INTRAMUSCULAR | Status: DC | PRN
  Administered 2022-08-28: 7 mL

## 2022-08-28 MED ORDER — LACTATED RINGERS IV SOLN: INTRAVENOUS | Status: DC

## 2022-08-28 MED ORDER — LIDOCAINE-EPINEPHRINE (PF) 1 %-1:200000 IJ SOLN
INTRAMUSCULAR | Status: AC
  Filled 2022-08-28: qty 30

## 2022-08-28 MED ORDER — MIDAZOLAM HCL 2 MG/2ML IJ SOLN
INTRAMUSCULAR | Status: AC
  Filled 2022-08-28: qty 2

## 2022-08-28 MED ORDER — CEFAZOLIN SODIUM-DEXTROSE 2-4 GM/100ML-% IV SOLN
2.0000 g | INTRAVENOUS | Status: AC
  Administered 2022-08-28: 2 g via INTRAVENOUS

## 2022-08-28 MED ORDER — GLYCOPYRROLATE PF 0.2 MG/ML IJ SOSY
PREFILLED_SYRINGE | INTRAMUSCULAR | Status: DC | PRN
  Administered 2022-08-28: .1 mg via INTRAVENOUS

## 2022-08-28 MED ORDER — FENTANYL CITRATE (PF) 100 MCG/2ML IJ SOLN: 25.0000 ug | INTRAMUSCULAR | Status: DC | PRN

## 2022-08-28 SURGICAL SUPPLY — 37 items
ARMBAND PINK RESTRICT EXTREMIT (MISCELLANEOUS) ×1 IMPLANT
BAG COUNTER SPONGE SURGICOUNT (BAG) ×1 IMPLANT
BENZOIN TINCTURE PRP APPL 2/3 (GAUZE/BANDAGES/DRESSINGS) ×1 IMPLANT
CANISTER SUCT 3000ML PPV (MISCELLANEOUS) ×1 IMPLANT
CHLORAPREP W/TINT 26 (MISCELLANEOUS) ×1 IMPLANT
CLIP LIGATING EXTRA MED SLVR (CLIP) ×1 IMPLANT
CLIP LIGATING EXTRA SM BLUE (MISCELLANEOUS) ×1 IMPLANT
CLSR STERI-STRIP ANTIMIC 1/2X4 (GAUZE/BANDAGES/DRESSINGS) IMPLANT
COVER PROBE W GEL 5X96 (DRAPES) ×1 IMPLANT
DRSG TEGADERM 2-3/8X2-3/4 SM (GAUZE/BANDAGES/DRESSINGS) IMPLANT
ELECT REM PT RETURN 9FT ADLT (ELECTROSURGICAL) ×1
ELECTRODE REM PT RTRN 9FT ADLT (ELECTROSURGICAL) ×1 IMPLANT
GAUZE SPONGE 2X2 8PLY STRL LF (GAUZE/BANDAGES/DRESSINGS) IMPLANT
GLOVE BIO SURGEON STRL SZ8 (GLOVE) ×1 IMPLANT
GOWN STRL REUS W/ TWL LRG LVL3 (GOWN DISPOSABLE) ×2 IMPLANT
GOWN STRL REUS W/ TWL XL LVL3 (GOWN DISPOSABLE) ×1 IMPLANT
GOWN STRL REUS W/TWL LRG LVL3 (GOWN DISPOSABLE) ×2
GOWN STRL REUS W/TWL XL LVL3 (GOWN DISPOSABLE) ×1
KIT BASIN OR (CUSTOM PROCEDURE TRAY) ×1 IMPLANT
KIT TURNOVER KIT B (KITS) ×1 IMPLANT
NS IRRIG 1000ML POUR BTL (IV SOLUTION) ×1 IMPLANT
PACK CV ACCESS (CUSTOM PROCEDURE TRAY) ×1 IMPLANT
PAD ARMBOARD 7.5X6 YLW CONV (MISCELLANEOUS) ×2 IMPLANT
SLING ARM FOAM STRAP LRG (SOFTGOODS) IMPLANT
SLING ARM FOAM STRAP MED (SOFTGOODS) IMPLANT
STRIP CLOSURE SKIN 1/2X4 (GAUZE/BANDAGES/DRESSINGS) ×2 IMPLANT
SUT MNCRL AB 4-0 PS2 18 (SUTURE) ×1 IMPLANT
SUT PROLENE 6 0 BV (SUTURE) ×1 IMPLANT
SUT SILK 2 0 SH (SUTURE) IMPLANT
SUT SILK 3 0 (SUTURE) ×1
SUT SILK 3-0 18XBRD TIE 12 (SUTURE) IMPLANT
SUT VIC AB 3-0 SH 27 (SUTURE) ×2
SUT VIC AB 3-0 SH 27X BRD (SUTURE) ×1 IMPLANT
TAPE UMBILICAL 1/8X30 (MISCELLANEOUS) IMPLANT
TOWEL GREEN STERILE (TOWEL DISPOSABLE) ×1 IMPLANT
UNDERPAD 30X36 HEAVY ABSORB (UNDERPADS AND DIAPERS) ×1 IMPLANT
WATER STERILE IRR 1000ML POUR (IV SOLUTION) ×1 IMPLANT

## 2022-08-28 NOTE — Telephone Encounter (Signed)
Pt called stating that he was worried that a stitch had "popped" because he was having some bleeding after his surgery today.  Reviewed pt's chart, returned call for clarification, two identifiers used. Pt stated that his arm had bled some, but not profusely. Instructed him to carefully remove saturated gauze and replace with clean gauze, hold pressure to stop bleeding, and go back to ED if bleeding becomes severe. Reassured him that he was capable and that it should improve. Confirmed understanding.

## 2022-08-28 NOTE — Progress Notes (Signed)
During preop assessment, patient answered "yeah, wishing I was dead" to the first question on the [place] "In the past month, have you wished you were dead or wished you could go to sleep and not wake up?" I investigated further and patient told me he has been set up to speak with someone through [employer]. He does not have a plan to end his life and never has. Patient stated that he has been sick since May and he feels like he's "just going through it" He states that he has no thoughts of actually harming himself or others. Unit management aware. Care Team aware.

## 2022-08-28 NOTE — Anesthesia Procedure Notes (Signed)
Procedure Name: General with mask airway Date/Time: 08/28/2022 7:41 AM  Performed by: Erick Colace, CRNAPre-anesthesia Checklist: Patient identified, Emergency Drugs available, Suction available and Patient being monitored Patient Re-evaluated:Patient Re-evaluated prior to induction Oxygen Delivery Method: Simple face mask Preoxygenation: Pre-oxygenation with 100% oxygen Induction Type: IV induction Dental Injury: Teeth and Oropharynx as per pre-operative assessment

## 2022-08-28 NOTE — Discharge Instructions (Signed)
° °  [facility] [place] ° °Discharge Instructions ° °AV Fistula or Graft Surgery for Dialysis Access ° °Please refer to the following instructions for your post-procedure care. Your surgeon or physician assistant will discuss any changes with you. ° °Activity ° °You may drive the day following your surgery, if you are comfortable and no longer taking prescription pain medication. Resume full activity as the soreness in your incision resolves. ° °Bathing/Showering ° °You may shower after you go home. Keep your incision dry for 48 hours. Do not soak in a bathtub, hot tub, or swim until the incision heals completely. You may not shower if you have a hemodialysis catheter. ° °Incision Care ° °Clean your incision with mild soap and water after 48 hours. Pat the area dry with a clean towel. You do not need a bandage unless otherwise instructed. Do not apply any ointments or creams to your incision. You may have skin glue on your incision. Do not peel it off. It will come off on its own in about one week. Your arm may swell a bit after surgery. To reduce swelling use pillows to elevate your arm so it is above your heart. Your doctor will tell you if you need to lightly wrap your arm with an ACE bandage. ° °Diet ° °Resume your normal diet. There are not special food restrictions following this procedure. In order to heal from your surgery, it is CRITICAL to get adequate nutrition. Your body requires vitamins, minerals, and protein. Vegetables are the best source of vitamins and minerals. Vegetables also provide the perfect balance of protein. Processed food has little nutritional value, so try to avoid this. ° °Medications ° °Resume taking all of your medications. If your incision is causing pain, you may take over-the counter pain relievers such as acetaminophen (Tylenol). If you were prescribed a stronger pain medication, please be aware these medications can cause nausea and constipation. Prevent  nausea by taking the medication with a snack or meal. Avoid constipation by drinking plenty of fluids and eating foods with high amount of fiber, such as fruits, vegetables, and grains. Do not take Tylenol if you are taking prescription pain medications. ° ° ° ° °Follow up °Your surgeon may want to see you in the office following your access surgery. If so, this will be arranged at the time of your surgery. ° °Please call us immediately for any of the following conditions: ° °Increased pain, redness, drainage (pus) from your incision site °Fever of 101 degrees or higher °Severe or worsening pain at your incision site °Hand pain or numbness. ° °Reduce your risk of vascular disease: ° °Stop smoking. If you would like help, call [facility] at 1-800-QUIT-NOW (1-800-784-8669) or  at 336-586-4000 ° °Manage your cholesterol °Maintain a desired weight °Control your diabetes °Keep your blood pressure down ° °Dialysis ° °It will take several weeks to several months for your new dialysis access to be ready for use. Your surgeon will determine when it is OK to use it. Your nephrologist will continue to direct your dialysis. You can continue to use your Permcath until your new access is ready for use. ° °If you have any questions, please call the office at [phone]. ° °

## 2022-08-28 NOTE — Transfer of Care (Signed)
Immediate Anesthesia Transfer of Care Note  Patient: Levi Hodges  Procedure(s) Performed: LEFT SECOND STAGE BASILIC VEIN TRANSPOSITION (Left)  Patient Location: PACU  Anesthesia Type:MAC combined with regional for post-op pain  Level of Consciousness: awake and alert   Airway & Oxygen Therapy: Patient Spontanous Breathing  Post-op Assessment: Report given to RN and Post -op Vital signs reviewed and stable  Post vital signs: Reviewed and stable  Last Vitals:  Vitals Value Taken Time  BP    Temp    Pulse    Resp    SpO2      Last Pain:  Vitals:   08/28/22 0642  TempSrc:   PainSc: 0-No pain      Patients Stated Pain Goal: 3 (72/82/06 0156)  Complications: No notable events documented.

## 2022-08-28 NOTE — Anesthesia Postprocedure Evaluation (Signed)
Anesthesia Post Note  Patient: Levi Hodges  Procedure(s) Performed: LEFT SECOND STAGE BASILIC VEIN TRANSPOSITION (Left)     Patient location during evaluation: PACU Anesthesia Type: Regional Level of consciousness: awake and alert Pain management: pain level controlled Vital Signs Assessment: post-procedure vital signs reviewed and stable Respiratory status: spontaneous breathing, nonlabored ventilation and respiratory function stable Cardiovascular status: stable and blood pressure returned to baseline Anesthetic complications: no   No notable events documented.  Last Vitals:  Vitals:   08/28/22 0930 08/28/22 0945  BP: 115/78 125/81  Pulse: 66 66  Resp: 15 12  Temp: 36.6 C 36.4 C  SpO2: 100% 100%    Last Pain:  Vitals:   08/28/22 0945  TempSrc:   PainSc: 0-No pain                 Audry Pili

## 2022-08-28 NOTE — H&P (Signed)
POST OPERATIVE OFFICE NOTE    CC:  F/u for surgery  HPI:  This is a 46 y.o. male who is s/p left BC AVF and TDC placement on 03/26/2022 by Dr. Stanford Breed.  This thrombosed and pt subsequently underwent left 1st stage BVT on 05/22/2022 also by Dr. Stanford Breed.  Pt returns today for follow up.    Pt states he does not have pain/numbness in the left hand.    The pt is on dialysis at the [place] location M/W/F.  His wife states that he is working on having teeth extraction due to infection and cysts and bleeding present.   This has not been scheduled.   He is working to get on the transplant list.     No Known Allergies  Current Facility-Administered Medications  Medication Dose Route Frequency Provider Last Rate Last Admin   0.9 %  sodium chloride infusion   Intravenous Continuous Cherre Robins, MD       ceFAZolin (ANCEF) 2-4 GM/100ML-% IVPB            ceFAZolin (ANCEF) IVPB 2g/100 mL premix  2 g Intravenous 30 min Pre-Op Cherre Robins, MD       chlorhexidine (HIBICLENS) 4 % liquid 4 Application  60 mL Topical Once Cherre Robins, MD       And   [START ON 08/29/2022] chlorhexidine (HIBICLENS) 4 % liquid 4 Application  60 mL Topical Once Cherre Robins, MD       insulin aspart (novoLOG) injection 0-7 Units  0-7 Units Subcutaneous Q2H PRN Audry Pili, MD       lactated ringers infusion   Intravenous Continuous Audry Pili, MD         ROS:  See HPI  Physical Exam:  Today's Vitals   08/27/22 1127 08/27/22 1135 08/28/22 0611 08/28/22 0642  BP:   137/81   Pulse:   73   Resp:   18   Temp:   98.6 F (37 C)   TempSrc:   Oral   SpO2:   99%   Weight:   65.8 kg   Height: '5\' 11"'$  (1.803 m)  '5\' 11"'$  (1.803 m)   PainSc:  0-No pain  0-No pain   Body mass index is 20.22 kg/m.   Incision:  healed Extremities:   There is a palpable left radial pulse.   Motor and sensory are in tact.   There is a thrill present.  Access is is easily palpable   Dialysis Duplex on  07/08/2022: Findings:  +--------------------+----------+-----------------+--------+  AVF                 PSV (cm/s)Flow Vol (mL/min)Comments  +--------------------+----------+-----------------+--------+  Native artery inflow   145           693                 +--------------------+----------+-----------------+--------+  AVF Anastomosis        614                               +--------------------+----------+-----------------+--------+   +------------+----------+-------------+----------+--------+  OUTFLOW VEINPSV (cm/s)Diameter (cm)Depth (cm)Describe  +------------+----------+-------------+----------+--------+  Prox UA         60        0.61        0.64             +------------+----------+-------------+----------+--------+  Mid UA  109        0.65        0.60             +------------+----------+-------------+----------+--------+  Dist UA        360        0.56        0.32             +------------+----------+-------------+----------+--------+  AC Fossa       581        0.22                         +------------+----------+-------------+----------+--------+  Distal radial artery 15 cm/s pre occlusive. Distal ulnar artery 84 cm/s  Triphasic waveform.   Summary:  Patent BVT.  [place] vein small at the anastomosis and in the ante cubitum.  The distal radial artery waveform is pre occlusive.   Assessment/Plan:  This is a 46 y.o. male who is s/p: left BC AVF and TDC placement on 03/26/2022 by Dr. Stanford Breed.  This thrombosed and pt subsequently underwent left 1st stage BVT on 05/22/2022 also by Dr. Stanford Breed.  Pt returns today for follow up.     -the pt does not have evidence of steal. -pt's left 1st stage BVT is maturing nicely.  Discussed results with Dr. Stanford Breed given above findings.  Pt has palpable left radial pulse.  He states it is ok to proceed with 2nd stage BVT.   -he will undergo his teeth extraction first as this is priority.  He and  his wife are given card with scheduler name and extension and they will call to schedule once he has recovered from his surgery.   -if pt has tunneled catheter, this can be removed at the discretion of the dialysis center once the pt's access has been successfully cannulated to their satisfaction.  -discussed with pt that access does not last forever and will need intervention or even new access at some point.   They both expressed understanding.   [name]. Stanford Breed, MD [facility] Phone Number: [phone] 08/28/2022 7:26 AM   VASCULAR STAFF ADDENDUM: I have independently interviewed and examined the patient. I agree with the above.  Plan second stage basilic fistula today.  [name]. Stanford Breed, MD [facility] Phone Number: [phone] 08/28/2022 7:29 AM

## 2022-08-28 NOTE — Anesthesia Procedure Notes (Signed)
Anesthesia Regional Block: Supraclavicular block   Pre-Anesthetic Checklist: , timeout performed,  Correct Patient, Correct Site, Correct Laterality,  Correct Procedure, Correct Position, site marked,  Risks and benefits discussed,  Surgical consent,  Pre-op evaluation,  At surgeon's request and post-op pain management  Laterality: Left  Prep: chloraprep       Needles:  Injection technique: Single-shot  Needle Type: Echogenic Needle     Needle Length: 5cm  Needle Gauge: 21     Additional Needles:   Narrative:  Start time: 08/28/2022 7:02 AM End time: 08/28/2022 7:05 AM Injection made incrementally with aspirations every 5 mL.  Performed by: Personally  Anesthesiologist: Audry Pili, MD  Additional Notes: No pain on injection. No increased resistance to injection. Injection made in 5cc increments. Good needle visualization. Patient tolerated the procedure well.

## 2022-08-28 NOTE — Op Note (Signed)
DATE OF SERVICE: 08/28/2022  PATIENT:  Levi Hodges  46 y.o. male  PRE-OPERATIVE DIAGNOSIS:  ESRD  POST-OPERATIVE DIAGNOSIS:  Same  PROCEDURE:   left brachiobasilic arteriovenous fistula transposition  SURGEON:  Surgeon(s) and Role:    * Cherre Robins, MD - Primary  ASSISTANT: Arlee Muslim, PA-C  An experienced assistant was required given the complexity of this procedure and the standard of surgical care. My assistant helped with exposure through counter tension, suctioning, ligation and retraction to better visualize the surgical field.  My assistant expedited sewing during the case by following my sutures. Wherever I use the term "we" in the report, my assistant actively helped me with that portion of the procedure.  ANESTHESIA:   regional and MAC  EBL: minimal  BLOOD ADMINISTERED:none  DRAINS: none   LOCAL MEDICATIONS USED:  LIDOCAINE   SPECIMEN:  none  COUNTS: confirmed correct.  TOURNIQUET:  none  PATIENT DISPOSITION:  PACU - hemodynamically stable.   Delay start of Pharmacological VTE agent (>24hrs) due to surgical blood loss or risk of bleeding: no  INDICATION FOR PROCEDURE: Levi Hodges is a 46 y.o. male with ESRD dialyzing via TDC. I created a first stage basilic vein [birth year] which has matured nicely. After careful discussion of risks, benefits, and alternatives the patient was offered second stage transposition. The patient understood and wished to proceed.  OPERATIVE FINDINGS: healthy basilic vein fistula  DESCRIPTION OF PROCEDURE: After identification of the patient in the pre-operative holding area, the patient was transferred to the operating room. The patient was positioned supine on the operating room table. Anesthesia was induced. The left arm was prepped and draped in standard fashion. A surgical pause was performed confirming correct patient, procedure, and operative location.  Using intraoperative ultrasound the course of the left basilic vein  was marked on the skin.  Skip incisions were made over the course of the basilic vein fistula.  These were carried down through subcutaneous tissue until the fistula was encountered.  The fascia was skeletonized from the axilla to the anastomosis, taking care to ligate and divide sidebranches, and to protect the medial antebrachial cutaneous nerve.   A subcutaneous tunnel was created over the biceps using a sheath tunneling device.  Patient was heparinized.  The fistula near the anastomosis was clamped.  The outflow in the axilla was clamped.  The fistula was divided.  The fistula was marked to ensure no twisting or kinking while delivering the fistula through the tunnel.  The fistula was tunneled through the arm and delivered near the previous anastomosis.  The fistula was spatulated proximally and distally.  The fistula was reanastomosed end-to-end using continuous running suture of 5-0 Prolene.  A palpable thrill was felt over the subcutaneous course of the tunnel.  Doppler flow was excellent in the proximal and distal fistula.  The wounds were copiously irrigated.  Hemostasis was ensured in the surgical bed.  The wounds were closed in layers using 3-0 Vicryl and 4-0 Monocryl.  Upon completion of the case instrument and sharps counts were confirmed correct. The patient was transferred to the PACU in good condition. I was present for all portions of the procedure.  [name]. Stanford Breed, MD [facility] Phone Number: [phone] 08/28/2022 9:10 AM

## 2022-08-29 ENCOUNTER — Encounter (HOSPITAL_COMMUNITY): Payer: Self-pay | Admitting: Vascular Surgery

## 2022-08-29 DIAGNOSIS — N2581 Secondary hyperparathyroidism of renal origin: Secondary | ICD-10-CM | POA: Diagnosis not present

## 2022-08-29 DIAGNOSIS — N186 End stage renal disease: Secondary | ICD-10-CM | POA: Diagnosis not present

## 2022-08-29 DIAGNOSIS — D689 Coagulation defect, unspecified: Secondary | ICD-10-CM | POA: Diagnosis not present

## 2022-08-29 DIAGNOSIS — Z992 Dependence on renal dialysis: Secondary | ICD-10-CM | POA: Diagnosis not present

## 2022-08-29 DIAGNOSIS — D631 Anemia in chronic kidney disease: Secondary | ICD-10-CM | POA: Diagnosis not present

## 2022-08-29 DIAGNOSIS — D509 Iron deficiency anemia, unspecified: Secondary | ICD-10-CM | POA: Diagnosis not present

## 2022-09-01 DIAGNOSIS — N186 End stage renal disease: Secondary | ICD-10-CM | POA: Diagnosis not present

## 2022-09-01 DIAGNOSIS — D689 Coagulation defect, unspecified: Secondary | ICD-10-CM | POA: Diagnosis not present

## 2022-09-01 DIAGNOSIS — D509 Iron deficiency anemia, unspecified: Secondary | ICD-10-CM | POA: Diagnosis not present

## 2022-09-01 DIAGNOSIS — D631 Anemia in chronic kidney disease: Secondary | ICD-10-CM | POA: Diagnosis not present

## 2022-09-01 DIAGNOSIS — N2581 Secondary hyperparathyroidism of renal origin: Secondary | ICD-10-CM | POA: Diagnosis not present

## 2022-09-01 DIAGNOSIS — Z992 Dependence on renal dialysis: Secondary | ICD-10-CM | POA: Diagnosis not present

## 2022-09-02 DIAGNOSIS — Z992 Dependence on renal dialysis: Secondary | ICD-10-CM | POA: Diagnosis not present

## 2022-09-02 DIAGNOSIS — N186 End stage renal disease: Secondary | ICD-10-CM | POA: Diagnosis not present

## 2022-09-02 DIAGNOSIS — E1122 Type 2 diabetes mellitus with diabetic chronic kidney disease: Secondary | ICD-10-CM | POA: Diagnosis not present

## 2022-09-05 DIAGNOSIS — Z992 Dependence on renal dialysis: Secondary | ICD-10-CM | POA: Diagnosis not present

## 2022-09-05 DIAGNOSIS — D509 Iron deficiency anemia, unspecified: Secondary | ICD-10-CM | POA: Diagnosis not present

## 2022-09-05 DIAGNOSIS — N2581 Secondary hyperparathyroidism of renal origin: Secondary | ICD-10-CM | POA: Diagnosis not present

## 2022-09-05 DIAGNOSIS — N186 End stage renal disease: Secondary | ICD-10-CM | POA: Diagnosis not present

## 2022-09-05 DIAGNOSIS — D689 Coagulation defect, unspecified: Secondary | ICD-10-CM | POA: Diagnosis not present

## 2022-09-05 DIAGNOSIS — D631 Anemia in chronic kidney disease: Secondary | ICD-10-CM | POA: Diagnosis not present

## 2022-09-05 DIAGNOSIS — E1122 Type 2 diabetes mellitus with diabetic chronic kidney disease: Secondary | ICD-10-CM | POA: Diagnosis not present

## 2022-09-08 DIAGNOSIS — N2581 Secondary hyperparathyroidism of renal origin: Secondary | ICD-10-CM | POA: Diagnosis not present

## 2022-09-08 DIAGNOSIS — E1122 Type 2 diabetes mellitus with diabetic chronic kidney disease: Secondary | ICD-10-CM | POA: Diagnosis not present

## 2022-09-08 DIAGNOSIS — D509 Iron deficiency anemia, unspecified: Secondary | ICD-10-CM | POA: Diagnosis not present

## 2022-09-08 DIAGNOSIS — N186 End stage renal disease: Secondary | ICD-10-CM | POA: Diagnosis not present

## 2022-09-08 DIAGNOSIS — D689 Coagulation defect, unspecified: Secondary | ICD-10-CM | POA: Diagnosis not present

## 2022-09-08 DIAGNOSIS — Z992 Dependence on renal dialysis: Secondary | ICD-10-CM | POA: Diagnosis not present

## 2022-09-08 DIAGNOSIS — D631 Anemia in chronic kidney disease: Secondary | ICD-10-CM | POA: Diagnosis not present

## 2022-09-10 DIAGNOSIS — E1122 Type 2 diabetes mellitus with diabetic chronic kidney disease: Secondary | ICD-10-CM | POA: Diagnosis not present

## 2022-09-10 DIAGNOSIS — D631 Anemia in chronic kidney disease: Secondary | ICD-10-CM | POA: Diagnosis not present

## 2022-09-10 DIAGNOSIS — N186 End stage renal disease: Secondary | ICD-10-CM | POA: Diagnosis not present

## 2022-09-10 DIAGNOSIS — N2581 Secondary hyperparathyroidism of renal origin: Secondary | ICD-10-CM | POA: Diagnosis not present

## 2022-09-10 DIAGNOSIS — D509 Iron deficiency anemia, unspecified: Secondary | ICD-10-CM | POA: Diagnosis not present

## 2022-09-10 DIAGNOSIS — D689 Coagulation defect, unspecified: Secondary | ICD-10-CM | POA: Diagnosis not present

## 2022-09-10 DIAGNOSIS — Z992 Dependence on renal dialysis: Secondary | ICD-10-CM | POA: Diagnosis not present

## 2022-09-12 DIAGNOSIS — N186 End stage renal disease: Secondary | ICD-10-CM | POA: Diagnosis not present

## 2022-09-12 DIAGNOSIS — Z992 Dependence on renal dialysis: Secondary | ICD-10-CM | POA: Diagnosis not present

## 2022-09-12 DIAGNOSIS — E1122 Type 2 diabetes mellitus with diabetic chronic kidney disease: Secondary | ICD-10-CM | POA: Diagnosis not present

## 2022-09-12 DIAGNOSIS — D509 Iron deficiency anemia, unspecified: Secondary | ICD-10-CM | POA: Diagnosis not present

## 2022-09-12 DIAGNOSIS — D689 Coagulation defect, unspecified: Secondary | ICD-10-CM | POA: Diagnosis not present

## 2022-09-12 DIAGNOSIS — D631 Anemia in chronic kidney disease: Secondary | ICD-10-CM | POA: Diagnosis not present

## 2022-09-12 DIAGNOSIS — N2581 Secondary hyperparathyroidism of renal origin: Secondary | ICD-10-CM | POA: Diagnosis not present

## 2022-09-15 DIAGNOSIS — Z992 Dependence on renal dialysis: Secondary | ICD-10-CM | POA: Diagnosis not present

## 2022-09-15 DIAGNOSIS — D509 Iron deficiency anemia, unspecified: Secondary | ICD-10-CM | POA: Diagnosis not present

## 2022-09-15 DIAGNOSIS — N2581 Secondary hyperparathyroidism of renal origin: Secondary | ICD-10-CM | POA: Diagnosis not present

## 2022-09-15 DIAGNOSIS — D631 Anemia in chronic kidney disease: Secondary | ICD-10-CM | POA: Diagnosis not present

## 2022-09-15 DIAGNOSIS — N186 End stage renal disease: Secondary | ICD-10-CM | POA: Diagnosis not present

## 2022-09-15 DIAGNOSIS — D689 Coagulation defect, unspecified: Secondary | ICD-10-CM | POA: Diagnosis not present

## 2022-09-15 DIAGNOSIS — E1122 Type 2 diabetes mellitus with diabetic chronic kidney disease: Secondary | ICD-10-CM | POA: Diagnosis not present

## 2022-09-17 DIAGNOSIS — N2581 Secondary hyperparathyroidism of renal origin: Secondary | ICD-10-CM | POA: Diagnosis not present

## 2022-09-17 DIAGNOSIS — E1122 Type 2 diabetes mellitus with diabetic chronic kidney disease: Secondary | ICD-10-CM | POA: Diagnosis not present

## 2022-09-17 DIAGNOSIS — N186 End stage renal disease: Secondary | ICD-10-CM | POA: Diagnosis not present

## 2022-09-17 DIAGNOSIS — D509 Iron deficiency anemia, unspecified: Secondary | ICD-10-CM | POA: Diagnosis not present

## 2022-09-17 DIAGNOSIS — Z992 Dependence on renal dialysis: Secondary | ICD-10-CM | POA: Diagnosis not present

## 2022-09-17 DIAGNOSIS — D689 Coagulation defect, unspecified: Secondary | ICD-10-CM | POA: Diagnosis not present

## 2022-09-17 DIAGNOSIS — D631 Anemia in chronic kidney disease: Secondary | ICD-10-CM | POA: Diagnosis not present

## 2022-09-19 DIAGNOSIS — N2581 Secondary hyperparathyroidism of renal origin: Secondary | ICD-10-CM | POA: Diagnosis not present

## 2022-09-19 DIAGNOSIS — E1122 Type 2 diabetes mellitus with diabetic chronic kidney disease: Secondary | ICD-10-CM | POA: Diagnosis not present

## 2022-09-19 DIAGNOSIS — Z992 Dependence on renal dialysis: Secondary | ICD-10-CM | POA: Diagnosis not present

## 2022-09-19 DIAGNOSIS — D509 Iron deficiency anemia, unspecified: Secondary | ICD-10-CM | POA: Diagnosis not present

## 2022-09-19 DIAGNOSIS — D689 Coagulation defect, unspecified: Secondary | ICD-10-CM | POA: Diagnosis not present

## 2022-09-19 DIAGNOSIS — N186 End stage renal disease: Secondary | ICD-10-CM | POA: Diagnosis not present

## 2022-09-19 DIAGNOSIS — D631 Anemia in chronic kidney disease: Secondary | ICD-10-CM | POA: Diagnosis not present

## 2022-09-21 DIAGNOSIS — N186 End stage renal disease: Secondary | ICD-10-CM | POA: Diagnosis not present

## 2022-09-21 DIAGNOSIS — Z992 Dependence on renal dialysis: Secondary | ICD-10-CM | POA: Diagnosis not present

## 2022-09-21 DIAGNOSIS — D509 Iron deficiency anemia, unspecified: Secondary | ICD-10-CM | POA: Diagnosis not present

## 2022-09-21 DIAGNOSIS — D689 Coagulation defect, unspecified: Secondary | ICD-10-CM | POA: Diagnosis not present

## 2022-09-21 DIAGNOSIS — E1122 Type 2 diabetes mellitus with diabetic chronic kidney disease: Secondary | ICD-10-CM | POA: Diagnosis not present

## 2022-09-21 DIAGNOSIS — D631 Anemia in chronic kidney disease: Secondary | ICD-10-CM | POA: Diagnosis not present

## 2022-09-21 DIAGNOSIS — N2581 Secondary hyperparathyroidism of renal origin: Secondary | ICD-10-CM | POA: Diagnosis not present

## 2022-09-23 DIAGNOSIS — N186 End stage renal disease: Secondary | ICD-10-CM | POA: Diagnosis not present

## 2022-09-23 DIAGNOSIS — Z992 Dependence on renal dialysis: Secondary | ICD-10-CM | POA: Diagnosis not present

## 2022-09-23 DIAGNOSIS — D631 Anemia in chronic kidney disease: Secondary | ICD-10-CM | POA: Diagnosis not present

## 2022-09-23 DIAGNOSIS — D509 Iron deficiency anemia, unspecified: Secondary | ICD-10-CM | POA: Diagnosis not present

## 2022-09-23 DIAGNOSIS — D689 Coagulation defect, unspecified: Secondary | ICD-10-CM | POA: Diagnosis not present

## 2022-09-23 DIAGNOSIS — E1122 Type 2 diabetes mellitus with diabetic chronic kidney disease: Secondary | ICD-10-CM | POA: Diagnosis not present

## 2022-09-23 DIAGNOSIS — N2581 Secondary hyperparathyroidism of renal origin: Secondary | ICD-10-CM | POA: Diagnosis not present

## 2022-09-26 DIAGNOSIS — D509 Iron deficiency anemia, unspecified: Secondary | ICD-10-CM | POA: Diagnosis not present

## 2022-09-26 DIAGNOSIS — D631 Anemia in chronic kidney disease: Secondary | ICD-10-CM | POA: Diagnosis not present

## 2022-09-26 DIAGNOSIS — Z992 Dependence on renal dialysis: Secondary | ICD-10-CM | POA: Diagnosis not present

## 2022-09-26 DIAGNOSIS — D689 Coagulation defect, unspecified: Secondary | ICD-10-CM | POA: Diagnosis not present

## 2022-09-26 DIAGNOSIS — N186 End stage renal disease: Secondary | ICD-10-CM | POA: Diagnosis not present

## 2022-09-26 DIAGNOSIS — E1122 Type 2 diabetes mellitus with diabetic chronic kidney disease: Secondary | ICD-10-CM | POA: Diagnosis not present

## 2022-09-26 DIAGNOSIS — N2581 Secondary hyperparathyroidism of renal origin: Secondary | ICD-10-CM | POA: Diagnosis not present

## 2022-09-29 DIAGNOSIS — N186 End stage renal disease: Secondary | ICD-10-CM | POA: Diagnosis not present

## 2022-09-29 DIAGNOSIS — N2581 Secondary hyperparathyroidism of renal origin: Secondary | ICD-10-CM | POA: Diagnosis not present

## 2022-09-29 DIAGNOSIS — D631 Anemia in chronic kidney disease: Secondary | ICD-10-CM | POA: Diagnosis not present

## 2022-09-29 DIAGNOSIS — E1122 Type 2 diabetes mellitus with diabetic chronic kidney disease: Secondary | ICD-10-CM | POA: Diagnosis not present

## 2022-09-29 DIAGNOSIS — D509 Iron deficiency anemia, unspecified: Secondary | ICD-10-CM | POA: Diagnosis not present

## 2022-09-29 DIAGNOSIS — Z992 Dependence on renal dialysis: Secondary | ICD-10-CM | POA: Diagnosis not present

## 2022-09-29 DIAGNOSIS — D689 Coagulation defect, unspecified: Secondary | ICD-10-CM | POA: Diagnosis not present

## 2022-09-30 ENCOUNTER — Other Ambulatory Visit: Payer: Self-pay

## 2022-09-30 DIAGNOSIS — E113392 Type 2 diabetes mellitus with moderate nonproliferative diabetic retinopathy without macular edema, left eye: Secondary | ICD-10-CM | POA: Diagnosis not present

## 2022-09-30 DIAGNOSIS — I10 Essential (primary) hypertension: Secondary | ICD-10-CM | POA: Diagnosis not present

## 2022-09-30 DIAGNOSIS — E113311 Type 2 diabetes mellitus with moderate nonproliferative diabetic retinopathy with macular edema, right eye: Secondary | ICD-10-CM | POA: Diagnosis not present

## 2022-09-30 LAB — HM DIABETES EYE EXAM

## 2022-10-01 DIAGNOSIS — N2581 Secondary hyperparathyroidism of renal origin: Secondary | ICD-10-CM | POA: Diagnosis not present

## 2022-10-01 DIAGNOSIS — E1122 Type 2 diabetes mellitus with diabetic chronic kidney disease: Secondary | ICD-10-CM | POA: Diagnosis not present

## 2022-10-01 DIAGNOSIS — D509 Iron deficiency anemia, unspecified: Secondary | ICD-10-CM | POA: Diagnosis not present

## 2022-10-01 DIAGNOSIS — D631 Anemia in chronic kidney disease: Secondary | ICD-10-CM | POA: Diagnosis not present

## 2022-10-01 DIAGNOSIS — D689 Coagulation defect, unspecified: Secondary | ICD-10-CM | POA: Diagnosis not present

## 2022-10-01 DIAGNOSIS — Z992 Dependence on renal dialysis: Secondary | ICD-10-CM | POA: Diagnosis not present

## 2022-10-01 DIAGNOSIS — N186 End stage renal disease: Secondary | ICD-10-CM | POA: Diagnosis not present

## 2022-10-02 DIAGNOSIS — Z992 Dependence on renal dialysis: Secondary | ICD-10-CM | POA: Diagnosis not present

## 2022-10-02 DIAGNOSIS — N186 End stage renal disease: Secondary | ICD-10-CM | POA: Diagnosis not present

## 2022-10-02 DIAGNOSIS — E1122 Type 2 diabetes mellitus with diabetic chronic kidney disease: Secondary | ICD-10-CM | POA: Diagnosis not present

## 2022-10-03 DIAGNOSIS — Z992 Dependence on renal dialysis: Secondary | ICD-10-CM | POA: Diagnosis not present

## 2022-10-03 DIAGNOSIS — N2581 Secondary hyperparathyroidism of renal origin: Secondary | ICD-10-CM | POA: Diagnosis not present

## 2022-10-03 DIAGNOSIS — D689 Coagulation defect, unspecified: Secondary | ICD-10-CM | POA: Diagnosis not present

## 2022-10-03 DIAGNOSIS — D631 Anemia in chronic kidney disease: Secondary | ICD-10-CM | POA: Diagnosis not present

## 2022-10-03 DIAGNOSIS — N186 End stage renal disease: Secondary | ICD-10-CM | POA: Diagnosis not present

## 2022-10-03 NOTE — Progress Notes (Unsigned)
Initial neurology clinic note  SERVICE DATE: 10/09/22  Reason for Evaluation: Consultation requested by Levi Hodges, * for an opinion regarding numbness in legs. My final recommendations will be communicated back to the requesting physician by way of shared medical record or letter to requesting physician via [place] mail.  HPI: This is [name], a 46 y.o. right-handed male with a medical history of HTN, HLD, DM2, ESRD on dialysis, iron deficiency anemia, current smoker who presents to neurology clinic with the chief complaint of numbness in legs. The patient is accompanied by wife.  Patient started having swollen legs for a while. In May, things started getting worse. He began having numbness and tingling in his legs in May 2023 as well. It is from his feet to hips. It feels more significant in the left leg compared to the right. It has progressively gotten worse as well. He feels the right leg may have gotten better. He was found to ESRD in May (due to DM) and started on dialysis. Prior to May, he only had occasional numbness and tingling in his feet. He also feels like the legs are weak, particularly the left leg. He has fallen twice since 03/2022. Once he got up and fell. Another time he went to sit and missed the chair and fell.   He does endorse low back pain. It can radiate down both legs.  Gabapentin 100 mg in the morning and 200 mg at night. It is helping a little, particularly at night.  There is no significant symptoms in the arms.  Patient has lost about 35 lbs since 03/2022, due to dialysis per patient.  EtOH use: No  Restrictive diet? Patient does not eat well since dialysis, but no restricted diet Family history of neuropathy/myopathy/NM disease? No  Patient has never had an EMG.   MEDICATIONS:  Outpatient Encounter Medications as of 10/09/2022  Medication Sig   amLODipine (NORVASC) 10 MG tablet Take 1 tablet (10 mg total) by mouth daily.   blood glucose  meter kit and supplies Dispense based on patient and insurance preference. Use up to four times daily as directed. (FOR ICD-10 E10.9, E11.9).   Blood Glucose Monitoring Suppl (ACCU-CHEK AVIVA PLUS) w/Device KIT Use up to 4 times a day   carvedilol (COREG) 6.25 MG tablet Take 1-2 tablets (6.25-12.5 mg total) by mouth 2 (two) times daily with a meal. (Patient taking differently: Take 6.25-12.5 mg by mouth See admin instructions. 12.5 mg in the morning, 6.25 mg in the evening)   gabapentin (NEURONTIN) 100 MG capsule Take 100 mg by mouth 2 (two) times daily.   glucose blood (ACCU-CHEK AVIVA PLUS) test strip Use as instructed up to 4 times per day   HYDROcodone-acetaminophen (NORCO) 5-325 MG tablet Take 1 tablet by mouth every 6 (six) hours as needed for moderate pain.   Insulin Pen Needle (PEN NEEDLES) 31G X 5 MM MISC 1 Package by Does not apply route daily.   Lancets (ACCU-CHEK MULTICLIX) lancets Use as instructed up to 4 times a day   Multiple Vitamin (DAILY-VITE MULTIVITAMIN) TABS Take 1 tablet by mouth daily with supper.   sevelamer carbonate (RENVELA) 800 MG tablet Take 1 tablet (800 mg total) by mouth 3 (three) times daily with meals.   simvastatin (ZOCOR) 20 MG tablet TAKE 1 TABLET(20 MG) BY MOUTH AT BEDTIME   amoxicillin (AMOXIL) 500 MG capsule Take 1 capsule (500 mg total) by mouth 2 (two) times daily. (Patient not taking: Reported on 08/22/2022)  No facility-administered encounter medications on file as of 10/09/2022.    PAST MEDICAL HISTORY: Past Medical History:  Diagnosis Date   Anemia    Depression    Diabetes mellitus without complication ([place])    Type II- no meds   Hypertension    Renal disorder     PAST SURGICAL HISTORY: Past Surgical History:  Procedure Laterality Date   AV FISTULA PLACEMENT Left 03/26/2022   Procedure: ARTERIOVENOUS (AV) FISTULA CREATION;  Surgeon: Cherre Robins, MD;  Location: [facility];  Service: Vascular;  Laterality: Left;   AV FISTULA PLACEMENT Left  05/22/2022   Procedure: FIRSTSTAGE BASILIC ARTERIOVENOUS  FISTULA CREATION;  Surgeon: Cherre Robins, MD;  Location: [facility];  Service: Vascular;  Laterality: Left;   [place] Left 08/28/2022   Procedure: LEFT SECOND STAGE BASILIC VEIN TRANSPOSITION;  Surgeon: Cherre Robins, MD;  Location: [facility];  Service: Vascular;  Laterality: Left;  PERIPHERAL NERVE BLOCK   INSERTION OF DIALYSIS CATHETER Right 03/26/2022   Procedure: INSERTION OF DIALYSIS CATHETER USING PALINDROME PRECISOIN CHRONIC CATHETER KIT (19cm);  Surgeon: Cherre Robins, MD;  Location: [facility];  Service: Vascular;  Laterality: Right;   TOOTH EXTRACTION N/A 07/23/2022   Procedure: DENTAL RESTORATION/EXTRACTIONS;  Surgeon: Diona Browner, DMD;  Location: [facility];  Service: Oral Surgery;  Laterality: N/A;    ALLERGIES: No Known Allergies  FAMILY HISTORY: Family History  Problem Relation Age of Onset   High blood pressure Mother    Diabetes Maternal Grandfather     SOCIAL HISTORY: Social History   Tobacco Use   Smoking status: Every Day    Packs/day: 1.00    Years: 30.00    Total pack years: 30.00    Types: Cigarettes    Passive exposure: Current   Smokeless tobacco: Never   Tobacco comments:    2 packs a day  Vaping Use   Vaping Use: Never used  Substance Use Topics   Alcohol use: No   Drug use: No   Social History   Social History Narrative   Are you right handed or left handed? right   Are you currently employed ?    What is your current occupation?retired   Do you live at home alone?wife   Who lives with you?    What type of home do you live in: 1 story or 2 story? one         OBJECTIVE: PHYSICAL EXAM: BP (!) 170/98   Pulse 82   Ht _0  (1.753 m)   Wt 145 lb 6.4 oz (66 kg)   SpO2 100%   BMI 21.47 kg/m   General: General appearance: Awake and alert. No distress. Cooperative with exam.  Skin: No obvious rash or jaundice. HEENT: Atraumatic. Anicteric. Lungs: Non-labored  breathing on room air  Extremities: No edema. Fistula in LUE Psych: Affect appropriate.  Neurological: Mental Status: Alert. Speech fluent. No pseudobulbar affect Cranial Nerves: CNII: No RAPD. Visual fields grossly intact. CNIII, IV, VI: PERRL. No nystagmus. EOMI. CN V: Facial sensation intact bilaterally to fine touch. CN VII: Facial muscles symmetric and strong. No ptosis at rest. CN VIII: Hearing grossly intact bilaterally. CN IX: No hypophonia. CN X: Palate elevates symmetrically. CN XI: Full strength shoulder shrug bilaterally. CN XII: Tongue protrusion full and midline. No atrophy or fasciculations. No significant dysarthria Motor: Tone is normal.  Individual muscle group testing (MRC grade out of 5):  Movement     Neck flexion 5  Neck extension 5     Right Left   Shoulder abduction 5 5   Shoulder adduction 5 5   Elbow flexion 5 5   Elbow extension 5 5   Wrist extension 5 5   Wrist flexion 5 5   Finger abduction - FDI 5 5   Finger abduction - ADM 5 5   Finger extension 5 5   Finger distal flexion - 2/_0 Finger distal flexion - 4/_1 Thumb flexion - FPL 5 5   Thumb abduction - APB 5 5    Hip flexion 5 5-   Hip extension 5 5-   Hip adduction 5 5   Hip abduction 5 5   Knee extension 5 5-   Knee flexion 5 5-   Dorsiflexion 5 5-   Plantarflexion 5 5   Inversion 5 5   Eversion 5 5     Reflexes:  Right Left   Bicep 1+ 1+   Tricep Trace Trace   BrRad Trace Trace   Knee Trace Trace   Ankle 0 0    Pathological Reflexes: Babinski: flexer response bilaterally Hoffman: absent bilaterally Sensation: Pinprick: Intact except 3 times less in left foot compared to left calf. Vibration: 13 seconds in right great toe, 6 seconds in left great toe Proprioception: Intact in bilateral great toes Coordination: Intact finger-to- nose-finger bilaterally. Romberg negative. Gait: Able to rise from chair with arms crossed unassisted. Normal, narrow-based gait.  Able to tandem walk.  Lab and Test Review: Internal labs: B12 (03/26/22): 502, folate (03/24/22): 8.5 TSH (03/26/22): 2.034 CBC significant for chronic anemia CMP significant for elevated glucose (112), BUN (29), Cr (9.27) HbA1c (03/24/22): 5.4; in 2020 was 14.6 Ferritin (03/24/22): 165  CT head wo contrast (04/21/22): FINDINGS: Brain: No intracranial hemorrhage, mass effect, or midline shift. Brain volume is normal for age. No hydrocephalus. The basilar cisterns are patent. No evidence of territorial infarct or acute ischemia. No encephalomalacia to suggest prior infarct. No extra-axial or intracranial fluid collection.   Vascular: Mild atherosclerosis of the carotid siphons. No hyperdense vessel.   Skull: No fracture or focal lesion.   Sinuses/Orbits: Paranasal sinuses and mastoid air cells are clear. The visualized orbits are unremarkable.   Other: None.   IMPRESSION: Negative noncontrast head CT.  No explanation for symptoms (numbness in legs and headaches for 2 weeks).   ASSESSMENT: Levi Hodges is a 46 y.o. male who presents for evaluation of numbness and tingling in legs. He has a relevant medical history of HTN, HLD, DM2, ESRD on dialysis, iron deficiency anemia, current smoker. His neurological examination is pertinent for equivocal weakness of left lower limb (vs give way weakness), diminished sensation in bilateral lower limbs with asymmetry (left worse than right), and hyporeflexia. Patient's symptoms may be multifactorial with contributions from neuropathy secondary to DM and ESRD and potentially lumbosacral radiculopathy on left due to back pain and asymmetry on exam.  PLAN: -Blood work: IFE, B1 -EMG: PN vs radiculopathy - LLE -Increase Gabapentin 300 mg to BID -We discussed patient's high BP this am. Patient states he did not take his medications this morning. We discussed importance of taking meds and to follow up with PCP if BP remains elevated.  -Return to  clinic in 6 months  The impression above as well as the plan as outlined below were extensively discussed with the patient (in the company of wife) who voiced understanding. All questions were answered to their satisfaction.  The patient was counseled on pertinent fall precautions per the printed material provided today, and as noted under the "Patient Instructions" section below.  When available, results of the above investigations and possible further recommendations will be communicated to the patient via telephone/MyChart. Patient to call office if not contacted after expected testing turnaround time.   Total time spent reviewing records, interview, history/exam, documentation, and coordination of care on day of encounter:  50 min   Thank you for allowing me to participate in patient's care.  If I can answer any additional questions, I would be pleased to do so.  Kai Levins, MD   CC: Levi Koch, MD [place] [id number]  [place]: Referring provider: Hoyt Koch, MD [address] [place],  [place] [address]

## 2022-10-06 DIAGNOSIS — Z992 Dependence on renal dialysis: Secondary | ICD-10-CM | POA: Diagnosis not present

## 2022-10-06 DIAGNOSIS — N2581 Secondary hyperparathyroidism of renal origin: Secondary | ICD-10-CM | POA: Diagnosis not present

## 2022-10-06 DIAGNOSIS — D631 Anemia in chronic kidney disease: Secondary | ICD-10-CM | POA: Diagnosis not present

## 2022-10-06 DIAGNOSIS — D689 Coagulation defect, unspecified: Secondary | ICD-10-CM | POA: Diagnosis not present

## 2022-10-06 DIAGNOSIS — N186 End stage renal disease: Secondary | ICD-10-CM | POA: Diagnosis not present

## 2022-10-07 ENCOUNTER — Other Ambulatory Visit: Payer: Self-pay | Admitting: *Deleted

## 2022-10-07 ENCOUNTER — Telehealth (INDEPENDENT_AMBULATORY_CARE_PROVIDER_SITE_OTHER): Payer: Medicare Other | Admitting: Internal Medicine

## 2022-10-07 ENCOUNTER — Encounter: Payer: Self-pay | Admitting: Internal Medicine

## 2022-10-07 DIAGNOSIS — D649 Anemia, unspecified: Secondary | ICD-10-CM

## 2022-10-07 DIAGNOSIS — H3500 Unspecified background retinopathy: Secondary | ICD-10-CM | POA: Diagnosis not present

## 2022-10-07 DIAGNOSIS — N186 End stage renal disease: Secondary | ICD-10-CM

## 2022-10-07 NOTE — Assessment & Plan Note (Signed)
Due to CKD. Checking CBC and B12 and ferritin to check.

## 2022-10-07 NOTE — Assessment & Plan Note (Signed)
Still struggling with dialysis some and having intermittent low BP. He is not taking BP meds day of dialysis.

## 2022-10-07 NOTE — Assessment & Plan Note (Signed)
Suspected diagnosis based on his description. Will get notes to confirm.

## 2022-10-07 NOTE — Progress Notes (Signed)
Virtual Visit via Video Note  I connected with Levi Hodges on 10/07/22 at  9:40 AM EST by a video enabled telemedicine application and verified that I am speaking with the correct person using two identifiers.  Eye doctor [facility]  The patient and the provider were at separate locations throughout the entire encounter. Patient location: home, Provider location: work   I discussed the limitations of evaluation and management by telemedicine and the availability of in person appointments. The patient expressed understanding and agreed to proceed. The patient and the provider were the only parties present for the visit unless noted in HPI below.  History of Present Illness: The patient is a 46 y.o. man with visit for concerns and eye problems. The eye doctor supposedly sent [place] information about labs they want checked. Still having intermittent low BP at dialysis stable.  Observations/Objective: Appearance: normal, breathing normal, no coughing   Assessment and Plan: See problem oriented charting  Follow Up: Ordered CBC and ferritin and B12, getting records to see if other labs are needed.  I discussed the assessment and treatment plan with the patient. The patient was provided an opportunity to ask questions and all were answered. The patient agreed with the plan and demonstrated an understanding of the instructions.   The patient was advised to call back or seek an in-person evaluation if the symptoms worsen or if the condition fails to improve as anticipated.  Hoyt Koch, MD

## 2022-10-08 DIAGNOSIS — D689 Coagulation defect, unspecified: Secondary | ICD-10-CM | POA: Diagnosis not present

## 2022-10-08 DIAGNOSIS — D631 Anemia in chronic kidney disease: Secondary | ICD-10-CM | POA: Diagnosis not present

## 2022-10-08 DIAGNOSIS — Z992 Dependence on renal dialysis: Secondary | ICD-10-CM | POA: Diagnosis not present

## 2022-10-08 DIAGNOSIS — N2581 Secondary hyperparathyroidism of renal origin: Secondary | ICD-10-CM | POA: Diagnosis not present

## 2022-10-08 DIAGNOSIS — N186 End stage renal disease: Secondary | ICD-10-CM | POA: Diagnosis not present

## 2022-10-09 ENCOUNTER — Ambulatory Visit (INDEPENDENT_AMBULATORY_CARE_PROVIDER_SITE_OTHER): Payer: Medicare Other | Admitting: Neurology

## 2022-10-09 ENCOUNTER — Other Ambulatory Visit (INDEPENDENT_AMBULATORY_CARE_PROVIDER_SITE_OTHER): Payer: Medicare Other

## 2022-10-09 ENCOUNTER — Encounter: Payer: Self-pay | Admitting: Neurology

## 2022-10-09 VITALS — BP 170/98 | HR 82 | Ht 69.0 in | Wt 145.4 lb

## 2022-10-09 DIAGNOSIS — N186 End stage renal disease: Secondary | ICD-10-CM

## 2022-10-09 DIAGNOSIS — E1142 Type 2 diabetes mellitus with diabetic polyneuropathy: Secondary | ICD-10-CM

## 2022-10-09 DIAGNOSIS — M545 Low back pain, unspecified: Secondary | ICD-10-CM

## 2022-10-09 DIAGNOSIS — R202 Paresthesia of skin: Secondary | ICD-10-CM | POA: Diagnosis not present

## 2022-10-09 DIAGNOSIS — D649 Anemia, unspecified: Secondary | ICD-10-CM | POA: Diagnosis not present

## 2022-10-09 DIAGNOSIS — R200 Anesthesia of skin: Secondary | ICD-10-CM

## 2022-10-09 LAB — CBC
HCT: 39.9 % (ref 39.0–52.0)
Hemoglobin: 13.3 g/dL (ref 13.0–17.0)
MCHC: 33.3 g/dL (ref 30.0–36.0)
MCV: 90.4 fl (ref 78.0–100.0)
Platelets: 300 10*3/uL (ref 150.0–400.0)
RBC: 4.41 Mil/uL (ref 4.22–5.81)
RDW: 15.6 % — ABNORMAL HIGH (ref 11.5–15.5)
WBC: 5.5 10*3/uL (ref 4.0–10.5)

## 2022-10-09 LAB — FERRITIN: Ferritin: 755.7 ng/mL — ABNORMAL HIGH (ref 22.0–322.0)

## 2022-10-09 LAB — VITAMIN B12: Vitamin B-12: 614 pg/mL (ref 211–911)

## 2022-10-09 MED ORDER — GABAPENTIN 100 MG PO CAPS: 300.0000 mg | ORAL_CAPSULE | Freq: Two times a day (BID) | ORAL | 3 refills | Status: DC

## 2022-10-09 NOTE — Patient Instructions (Signed)
I will do blood work today.  We will get a muscle and nerve tested called an EMG (see more information below).  I will be in touch when I have your results.  For your tingling pain, you can increase your gabapentin to 300 mg (3 tablets) 2 times per day. If you have side effects, you can back down to 200 mg (2 tablets).  Monitor your blood pressure and discuss with your primary doctor if it continues to be high.  I want to see you back in clinic in 6 months. Please let me know if you have any questions or concerns in the meantime.   The physicians and staff at [facility] are committed to providing excellent care. You may receive a survey requesting feedback about your experience at our office. We strive to receive "very good" responses to the survey questions. If you feel that your experience would prevent you from giving the office a "very good " response, please contact our office to try to remedy the situation. We may be reached at [phone]. Thank you for taking the time out of your busy day to complete the survey.  Kai Levins, MD [facility]  ELECTROMYOGRAM AND NERVE CONDUCTION STUDIES (EMG/NCS) INSTRUCTIONS  How to Prepare The neurologist conducting the EMG will need to know if you have certain medical conditions. Tell the neurologist and other EMG lab personnel if you: Have a pacemaker or any other electrical medical device Take blood-thinning medications Have hemophilia, a blood-clotting disorder that causes prolonged bleeding Bathing Take a shower or bath shortly before your exam in order to remove oils from your skin. Don't apply lotions or creams before the exam.  What to Expect You'll likely be asked to change into a hospital gown for the procedure and lie down on an examination table. The following explanations can help you understand what will happen during the exam.  Electrodes. The neurologist or a technician places surface electrodes at various locations  on your skin depending on where you're experiencing symptoms. Or the neurologist may insert needle electrodes at different sites depending on your symptoms.  Sensations. The electrodes will at times transmit a tiny electrical current that you may feel as a twinge or spasm. The needle electrode may cause discomfort or pain that usually ends shortly after the needle is removed. If you are concerned about discomfort or pain, you may want to talk to the neurologist about taking a short break during the exam.  Instructions. During the needle EMG, the neurologist will assess whether there is any spontaneous electrical activity when the muscle is at rest - activity that isn't present in healthy muscle tissue - and the degree of activity when you slightly contract the muscle.  He or she will give you instructions on resting and contracting a muscle at appropriate times. Depending on what muscles and nerves the neurologist is examining, he or she may ask you to change positions during the exam.  After your EMG You may experience some temporary, minor bruising where the needle electrode was inserted into your muscle. This bruising should fade within several days. If it persists, contact your primary care doctor.   Preventing Falls at [facility] are common, often dreaded events in the lives of older people. Aside from the obvious injuries and even death that may result, fall can cause wide-ranging consequences including loss of independence, mental decline, decreased activity and mobility. Younger people are also at risk of falling, especially those with chronic illnesses and  fatigue.  Ways to reduce risk for falling Examine diet and medications. Warm foods and alcohol dilate blood vessels, which can lead to dizziness when standing. Sleep aids, antidepressants and pain medications can also increase the likelihood of a fall.  Get a vision exam. Poor vision, cataracts and glaucoma increase the chances of  falling.  Check foot gear. Shoes should fit snugly and have a sturdy, nonskid sole and a broad, low heel  Participate in a physician-approved exercise program to build and maintain muscle strength and improve balance and coordination. Programs that use ankle weights or stretch bands are excellent for muscle-strengthening. Water aerobics programs and low-impact Tai Chi programs have also been shown to improve balance and coordination.  Increase vitamin D intake. Vitamin D improves muscle strength and increases the amount of calcium the body is able to absorb and deposit in bones.  How to prevent falls from common hazards Floors - Remove all loose wires, cords, and throw rugs. Minimize clutter. Make sure rugs are anchored and smooth. Keep furniture in its usual place.  Chairs -- Use chairs with straight backs, armrests and firm seats. Add firm cushions to existing pieces to add height.  Bathroom - Install grab bars and non-skid tape in the tub or shower. Use a bathtub transfer bench or a shower chair with a back support Use an elevated toilet seat and/or safety rails to assist standing from a low surface. Do not use towel racks or bathroom tissue holders to help you stand.  Lighting - Make sure halls, stairways, and entrances are well-lit. Install a night light in your bathroom or hallway. Make sure there is a light switch at the top and bottom of the staircase. Turn lights on if you get up in the middle of the night. Make sure lamps or light switches are within reach of the bed if you have to get up during the night.  Kitchen - Install non-skid rubber mats near the sink and stove. Clean spills immediately. Store frequently used utensils, pots, pans between waist and eye level. This helps prevent reaching and bending. Sit when getting things out of lower cupboards.  Living room/ Bedrooms - Place furniture with wide spaces in between, giving enough room to move around. Establish a route through the  living room that gives you something to hold onto as you walk.  Stairs - Make sure treads, rails, and rugs are secure. Install a rail on both sides of the stairs. If stairs are a threat, it might be helpful to arrange most of your activities on the lower level to reduce the number of times you must climb the stairs.  Entrances and doorways - Install metal handles on the walls adjacent to the doorknobs of all doors to make it more secure as you travel through the doorway.  Tips for maintaining balance Keep at least one hand free at all times. Try using a backpack or fanny pack to hold things rather than carrying them in your hands. Never carry objects in both hands when walking as this interferes with keeping your balance.  Attempt to swing both arms from front to back while walking. This might require a conscious effort if Parkinson's disease has diminished your movement. It will, however, help you to maintain balance and posture, and reduce fatigue.  Consciously lift your feet off of the ground when walking. Shuffling and dragging of the feet is a common culprit in losing your balance.  When trying to navigate turns, use a "U" technique  of facing forward and making a wide turn, rather than pivoting sharply.  Try to stand with your feet shoulder-length apart. When your feet are close together for any length of time, you increase your risk of losing your balance and falling.  Do one thing at a time. Don't try to walk and accomplish another task, such as reading or looking around. The decrease in your automatic reflexes complicates motor function, so the less distraction, the better.  Do not wear rubber or gripping soled shoes, they might "catch" on the floor and cause tripping.  Move slowly when changing positions. Use deliberate, concentrated movements and, if needed, use a grab bar or walking aid. Count 15 seconds between each movement. For example, when rising from a seated position, wait 15  seconds after standing to begin walking.  If balance is a continuous problem, you might want to consider a walking aid such as a cane, walking stick, or walker. Once you've mastered walking with help, you might be ready to try it on your own again.

## 2022-10-10 DIAGNOSIS — D689 Coagulation defect, unspecified: Secondary | ICD-10-CM | POA: Diagnosis not present

## 2022-10-10 DIAGNOSIS — D631 Anemia in chronic kidney disease: Secondary | ICD-10-CM | POA: Diagnosis not present

## 2022-10-10 DIAGNOSIS — N2581 Secondary hyperparathyroidism of renal origin: Secondary | ICD-10-CM | POA: Diagnosis not present

## 2022-10-10 DIAGNOSIS — N186 End stage renal disease: Secondary | ICD-10-CM | POA: Diagnosis not present

## 2022-10-10 DIAGNOSIS — Z992 Dependence on renal dialysis: Secondary | ICD-10-CM | POA: Diagnosis not present

## 2022-10-13 DIAGNOSIS — N186 End stage renal disease: Secondary | ICD-10-CM | POA: Diagnosis not present

## 2022-10-13 DIAGNOSIS — D689 Coagulation defect, unspecified: Secondary | ICD-10-CM | POA: Diagnosis not present

## 2022-10-13 DIAGNOSIS — N2581 Secondary hyperparathyroidism of renal origin: Secondary | ICD-10-CM | POA: Diagnosis not present

## 2022-10-13 DIAGNOSIS — Z992 Dependence on renal dialysis: Secondary | ICD-10-CM | POA: Diagnosis not present

## 2022-10-13 DIAGNOSIS — D631 Anemia in chronic kidney disease: Secondary | ICD-10-CM | POA: Diagnosis not present

## 2022-10-14 ENCOUNTER — Ambulatory Visit (HOSPITAL_COMMUNITY)
Admission: RE | Admit: 2022-10-14 | Discharge: 2022-10-14 | Disposition: A | Discharge status: Home / Self Care | Payer: Medicare Other | Source: Ambulatory Visit | Attending: Vascular Surgery | Admitting: Vascular Surgery

## 2022-10-14 ENCOUNTER — Ambulatory Visit (INDEPENDENT_AMBULATORY_CARE_PROVIDER_SITE_OTHER): Payer: Medicare Other | Admitting: Physician Assistant

## 2022-10-14 VITALS — BP 161/94 | HR 71 | Temp 97.6°F | Resp 20 | Ht 69.0 in | Wt 152.9 lb

## 2022-10-14 DIAGNOSIS — Z992 Dependence on renal dialysis: Secondary | ICD-10-CM

## 2022-10-14 DIAGNOSIS — N186 End stage renal disease: Secondary | ICD-10-CM

## 2022-10-14 NOTE — Progress Notes (Signed)
POST OPERATIVE OFFICE NOTE    CC:  F/u for surgery  HPI:  This is a 46 y.o. male who is s/p second stage basilic transposition by Dr. Hawken.    The first stage basilic was performed on 05/22/22.  He had a previous failed left BC fistula back on 03/26/22.  Pt returns today for follow up.  Pt states he has on/off coldness with numbness in the ulnar nerve distribution.  These symptoms are tolerable.  He is on HD MWF via TDC that was placed by Dr. Hawken 03/26/22.     No Known Allergies  Current Outpatient Medications  Medication Sig Dispense Refill   amLODipine (NORVASC) 10 MG tablet Take 1 tablet (10 mg total) by mouth daily. 90 tablet 3   blood glucose meter kit and supplies Dispense based on patient and insurance preference. Use up to four times daily as directed. (FOR ICD-10 E10.9, E11.9). 1 each 0   Blood Glucose Monitoring Suppl (ACCU-CHEK AVIVA PLUS) w/Device KIT Use up to 4 times a day 1 kit 0   carvedilol (COREG) 6.25 MG tablet Take 1-2 tablets (6.25-12.5 mg total) by mouth 2 (two) times daily with a meal. (Patient taking differently: Take 6.25-12.5 mg by mouth See admin instructions. 12.5 mg in the morning, 6.25 mg in the evening) 270 tablet 3   gabapentin (NEURONTIN) 100 MG capsule Take 3 capsules (300 mg total) by mouth 2 (two) times daily. 540 capsule 3   glucose blood (ACCU-CHEK AVIVA PLUS) test strip Use as instructed up to 4 times per day 200 each 12   HYDROcodone-acetaminophen (NORCO) 5-325 MG tablet Take 1 tablet by mouth every 6 (six) hours as needed for moderate pain. 15 tablet 0   Insulin Pen Needle (PEN NEEDLES) 31G X 5 MM MISC 1 Package by Does not apply route daily. 100 each 3   Lancets (ACCU-CHEK MULTICLIX) lancets Use as instructed up to 4 times a day 204 each 12   Multiple Vitamin (DAILY-VITE MULTIVITAMIN) TABS Take 1 tablet by mouth daily with supper.     sevelamer carbonate (RENVELA) 800 MG tablet Take 1 tablet (800 mg total) by mouth 3 (three) times daily with  meals. 90 tablet 11   simvastatin (ZOCOR) 20 MG tablet TAKE 1 TABLET(20 MG) BY MOUTH AT BEDTIME 90 tablet 3   No current facility-administered medications for this visit.     ROS:  See HPI  Physical Exam: Findings:  +--------------------+----------+-----------------+--------+  AVF                PSV (cm/s)Flow Vol (mL/min)Comments  +--------------------+----------+-----------------+--------+  Native artery inflow   195          1224                 +--------------------+----------+-----------------+--------+  AVF Anastomosis        719                               +--------------------+----------+-----------------+--------+     +------------+----------+-------------+----------+--------+  OUTFLOW VEINPSV (cm/s)Diameter (cm)Depth (cm)Describe  +------------+----------+-------------+----------+--------+  Prox UA        287        0.66        0.60             +------------+----------+-------------+----------+--------+  Mid UA         254        0.51        0.18             +------------+----------+-------------+----------+--------+    Dist UA        267        0.58        0.18             +------------+----------+-------------+----------+--------+  AC Fossa       589        0.32        0.34             +------------+----------+-------------+----------+--------+     Summary:  Patent arteriovenous fistula.     Incision:  well healed Extremities:  palpable radial pulse left UE with easily palpable thrill in the fistula.   Neuro: ulnar nerve distribution decreased sensation Lungs non labored breathing    Assessment/Plan:  This is a 46 y.o. male who is s/p: successful second stage basilic AV fistula.    The fistula has good flow > 1200 ml/min and a palpable thrill.  There is mild stenosis in the distal fistula.  We will allow the fistula to be used.  If there are any concerns he will call our office once the fistula is working well he  will be scheduled for [facility] removal.      Roxy Horseman PA-C [facility] [phone]   Clinic MD:  Stanford Breed

## 2022-10-15 DIAGNOSIS — N2581 Secondary hyperparathyroidism of renal origin: Secondary | ICD-10-CM | POA: Diagnosis not present

## 2022-10-15 DIAGNOSIS — D689 Coagulation defect, unspecified: Secondary | ICD-10-CM | POA: Diagnosis not present

## 2022-10-15 DIAGNOSIS — N186 End stage renal disease: Secondary | ICD-10-CM | POA: Diagnosis not present

## 2022-10-15 DIAGNOSIS — Z992 Dependence on renal dialysis: Secondary | ICD-10-CM | POA: Diagnosis not present

## 2022-10-15 DIAGNOSIS — D631 Anemia in chronic kidney disease: Secondary | ICD-10-CM | POA: Diagnosis not present

## 2022-10-17 DIAGNOSIS — N186 End stage renal disease: Secondary | ICD-10-CM | POA: Diagnosis not present

## 2022-10-17 DIAGNOSIS — D631 Anemia in chronic kidney disease: Secondary | ICD-10-CM | POA: Diagnosis not present

## 2022-10-17 DIAGNOSIS — N2581 Secondary hyperparathyroidism of renal origin: Secondary | ICD-10-CM | POA: Diagnosis not present

## 2022-10-17 DIAGNOSIS — Z992 Dependence on renal dialysis: Secondary | ICD-10-CM | POA: Diagnosis not present

## 2022-10-17 DIAGNOSIS — D689 Coagulation defect, unspecified: Secondary | ICD-10-CM | POA: Diagnosis not present

## 2022-10-17 LAB — IMMUNOFIXATION ELECTROPHORESIS
IgG (Immunoglobin G), Serum: 1521 mg/dL (ref 600–1640)
IgM, Serum: 242 mg/dL (ref 50–300)
Immunoglobulin A: 324 mg/dL — ABNORMAL HIGH (ref 47–310)

## 2022-10-17 LAB — VITAMIN B1: Vitamin B1 (Thiamine): 17 nmol/L (ref 8–30)

## 2022-10-20 ENCOUNTER — Telehealth: Payer: Self-pay | Admitting: Neurology

## 2022-10-20 ENCOUNTER — Other Ambulatory Visit: Payer: Self-pay

## 2022-10-20 DIAGNOSIS — N2581 Secondary hyperparathyroidism of renal origin: Secondary | ICD-10-CM | POA: Diagnosis not present

## 2022-10-20 DIAGNOSIS — D631 Anemia in chronic kidney disease: Secondary | ICD-10-CM | POA: Diagnosis not present

## 2022-10-20 DIAGNOSIS — N186 End stage renal disease: Secondary | ICD-10-CM | POA: Diagnosis not present

## 2022-10-20 DIAGNOSIS — D689 Coagulation defect, unspecified: Secondary | ICD-10-CM | POA: Diagnosis not present

## 2022-10-20 DIAGNOSIS — D472 Monoclonal gammopathy: Secondary | ICD-10-CM

## 2022-10-20 DIAGNOSIS — Z992 Dependence on renal dialysis: Secondary | ICD-10-CM | POA: Diagnosis not present

## 2022-10-20 NOTE — Telephone Encounter (Signed)
Called patient to discuss the results of his lab work. His IFE showed a monoclonal protein (IgG kappa). I recommended hematology referral, with which patient agreed.  All questions were answered.  Kai Levins, MD [facility] Neurology

## 2022-10-24 DIAGNOSIS — N186 End stage renal disease: Secondary | ICD-10-CM | POA: Diagnosis not present

## 2022-10-24 DIAGNOSIS — D689 Coagulation defect, unspecified: Secondary | ICD-10-CM | POA: Diagnosis not present

## 2022-10-24 DIAGNOSIS — Z992 Dependence on renal dialysis: Secondary | ICD-10-CM | POA: Diagnosis not present

## 2022-10-24 DIAGNOSIS — D631 Anemia in chronic kidney disease: Secondary | ICD-10-CM | POA: Diagnosis not present

## 2022-10-24 DIAGNOSIS — N2581 Secondary hyperparathyroidism of renal origin: Secondary | ICD-10-CM | POA: Diagnosis not present

## 2022-10-26 DIAGNOSIS — Z992 Dependence on renal dialysis: Secondary | ICD-10-CM | POA: Diagnosis not present

## 2022-10-26 DIAGNOSIS — D631 Anemia in chronic kidney disease: Secondary | ICD-10-CM | POA: Diagnosis not present

## 2022-10-26 DIAGNOSIS — N186 End stage renal disease: Secondary | ICD-10-CM | POA: Diagnosis not present

## 2022-10-26 DIAGNOSIS — D689 Coagulation defect, unspecified: Secondary | ICD-10-CM | POA: Diagnosis not present

## 2022-10-26 DIAGNOSIS — N2581 Secondary hyperparathyroidism of renal origin: Secondary | ICD-10-CM | POA: Diagnosis not present

## 2022-10-29 ENCOUNTER — Ambulatory Visit (INDEPENDENT_AMBULATORY_CARE_PROVIDER_SITE_OTHER): Payer: Medicare Other | Admitting: Podiatry

## 2022-10-29 DIAGNOSIS — B351 Tinea unguium: Secondary | ICD-10-CM | POA: Diagnosis not present

## 2022-10-29 DIAGNOSIS — M79675 Pain in left toe(s): Secondary | ICD-10-CM

## 2022-10-29 DIAGNOSIS — M79674 Pain in right toe(s): Secondary | ICD-10-CM

## 2022-10-29 DIAGNOSIS — N186 End stage renal disease: Secondary | ICD-10-CM | POA: Diagnosis not present

## 2022-10-29 DIAGNOSIS — D631 Anemia in chronic kidney disease: Secondary | ICD-10-CM | POA: Diagnosis not present

## 2022-10-29 DIAGNOSIS — Z992 Dependence on renal dialysis: Secondary | ICD-10-CM | POA: Diagnosis not present

## 2022-10-29 DIAGNOSIS — D689 Coagulation defect, unspecified: Secondary | ICD-10-CM | POA: Diagnosis not present

## 2022-10-29 DIAGNOSIS — N2581 Secondary hyperparathyroidism of renal origin: Secondary | ICD-10-CM | POA: Diagnosis not present

## 2022-10-29 NOTE — Progress Notes (Signed)
  Subjective:  Patient ID: Levi Hodges, male    DOB: August 29, 1976,  MRN: [id number]  Chief Complaint  Patient presents with   Nail Problem    Nail trim    46 y.o. male returns for the above complaint.  Patient presents with thickened elongated dystrophic toenails x10 mild pain on palpation.  He would like to have them debrided down he is a diabetic with controlled A1c.  He has not seen anyone else prior to seeing me for this.  Denies any other acute complaints  Objective:  There were no vitals filed for this visit. Podiatric Exam: Vascular: dorsalis pedis and posterior tibial pulses are palpable bilateral. Capillary return is immediate. Temperature gradient is WNL. Skin turgor WNL  Sensorium: Normal Semmes Weinstein monofilament test. Normal tactile sensation bilaterally. Nail Exam: Pt has thick disfigured discolored nails with subungual debris noted bilateral entire nail hallux through fifth toenails.  Pain on palpation to the nails. Ulcer Exam: There is no evidence of ulcer or pre-ulcerative changes or infection. Orthopedic Exam: Muscle tone and strength are WNL. No limitations in general ROM. No crepitus or effusions noted.  Skin: No Porokeratosis. No infection or ulcers    Assessment & Plan:   No diagnosis found.   Patient was evaluated and treated and all questions answered.  Onychomycosis with pain  -Nails palliatively debrided as below. -Educated on self-care  Procedure: Nail Debridement Rationale: pain  Type of Debridement: manual, sharp debridement. Instrumentation: Nail nipper, rotary burr. Number of Nails: 10  Procedures and Treatment: Consent by patient was obtained for treatment procedures. The patient understood the discussion of treatment and procedures well. All questions were answered thoroughly reviewed. Debridement of mycotic and hypertrophic toenails, 1 through 5 bilateral and clearing of subungual debris. No ulceration, no infection noted.  Return  Visit-Office Procedure: Patient instructed to return to the office for a follow up visit 3 months for continued evaluation and treatment.  Boneta Lucks, DPM    No follow-ups on file.

## 2022-10-30 ENCOUNTER — Other Ambulatory Visit: Payer: Self-pay

## 2022-10-30 ENCOUNTER — Telehealth: Payer: Self-pay | Admitting: *Deleted

## 2022-10-30 ENCOUNTER — Inpatient Hospital Stay (HOSPITAL_BASED_OUTPATIENT_CLINIC_OR_DEPARTMENT_OTHER): Payer: Medicare Other | Admitting: Hematology & Oncology

## 2022-10-30 ENCOUNTER — Inpatient Hospital Stay: Payer: Medicare Other | Attending: Hematology & Oncology

## 2022-10-30 ENCOUNTER — Encounter: Payer: Self-pay | Admitting: Hematology & Oncology

## 2022-10-30 VITALS — BP 142/90 | HR 73 | Temp 98.3°F | Resp 18 | Ht 69.0 in | Wt 150.0 lb

## 2022-10-30 DIAGNOSIS — F17210 Nicotine dependence, cigarettes, uncomplicated: Secondary | ICD-10-CM | POA: Insufficient documentation

## 2022-10-30 DIAGNOSIS — D472 Monoclonal gammopathy: Secondary | ICD-10-CM | POA: Insufficient documentation

## 2022-10-30 DIAGNOSIS — G63 Polyneuropathy in diseases classified elsewhere: Secondary | ICD-10-CM | POA: Diagnosis not present

## 2022-10-30 DIAGNOSIS — E1122 Type 2 diabetes mellitus with diabetic chronic kidney disease: Secondary | ICD-10-CM | POA: Diagnosis not present

## 2022-10-30 DIAGNOSIS — Z79899 Other long term (current) drug therapy: Secondary | ICD-10-CM | POA: Insufficient documentation

## 2022-10-30 DIAGNOSIS — Z992 Dependence on renal dialysis: Secondary | ICD-10-CM | POA: Diagnosis not present

## 2022-10-30 LAB — CBC WITH DIFFERENTIAL (CANCER CENTER ONLY)
Abs Immature Granulocytes: 0.01 10*3/uL (ref 0.00–0.07)
Basophils Absolute: 0.1 10*3/uL (ref 0.0–0.1)
Basophils Relative: 1 %
Eosinophils Absolute: 0.4 10*3/uL (ref 0.0–0.5)
Eosinophils Relative: 5 %
HCT: 37.3 % — ABNORMAL LOW (ref 39.0–52.0)
Hemoglobin: 12.5 g/dL — ABNORMAL LOW (ref 13.0–17.0)
Immature Granulocytes: 0 %
Lymphocytes Relative: 33 %
Lymphs Abs: 2.3 10*3/uL (ref 0.7–4.0)
MCH: 29.7 pg (ref 26.0–34.0)
MCHC: 33.5 g/dL (ref 30.0–36.0)
MCV: 88.6 fL (ref 80.0–100.0)
Monocytes Absolute: 0.6 10*3/uL (ref 0.1–1.0)
Monocytes Relative: 8 %
Neutro Abs: 3.7 10*3/uL (ref 1.7–7.7)
Neutrophils Relative %: 53 %
Platelet Count: 287 10*3/uL (ref 150–400)
RBC: 4.21 MIL/uL — ABNORMAL LOW (ref 4.22–5.81)
RDW: 14 % (ref 11.5–15.5)
WBC Count: 7.1 10*3/uL (ref 4.0–10.5)
nRBC: 0 % (ref 0.0–0.2)

## 2022-10-30 LAB — LACTATE DEHYDROGENASE: LDH: 164 U/L (ref 98–192)

## 2022-10-30 LAB — CMP (CANCER CENTER ONLY)
ALT: 12 U/L (ref 0–44)
AST: 18 U/L (ref 15–41)
Albumin: 4.8 g/dL (ref 3.5–5.0)
Alkaline Phosphatase: 61 U/L (ref 38–126)
Anion gap: 10 (ref 5–15)
BUN: 28 mg/dL — ABNORMAL HIGH (ref 6–20)
CO2: 33 mmol/L — ABNORMAL HIGH (ref 22–32)
Calcium: 9 mg/dL (ref 8.9–10.3)
Chloride: 93 mmol/L — ABNORMAL LOW (ref 98–111)
Creatinine: 8.92 mg/dL (ref 0.61–1.24)
GFR, Estimated: 7 mL/min — ABNORMAL LOW (ref >60)
Glucose, Bld: 117 mg/dL — ABNORMAL HIGH (ref 70–99)
Potassium: 4.1 mmol/L (ref 3.5–5.1)
Sodium: 136 mmol/L (ref 135–145)
Total Bilirubin: 0.4 mg/dL (ref 0.3–1.2)
Total Protein: 7.8 g/dL (ref 6.5–8.1)

## 2022-10-30 LAB — SAVE SMEAR(SSMR), FOR PROVIDER SLIDE REVIEW

## 2022-10-30 NOTE — Telephone Encounter (Signed)
Dr. Marin Olp notified of creat-8.92.  No new orders received at this time.

## 2022-10-30 NOTE — Progress Notes (Signed)
Referral MD  Reason for Referral: IgG kappa monoclonal gammopathy; diabetes; chronic renal failure on hemodialysis; neuropathy    Chief Complaint  Patient presents with   New Patient (Initial Visit)  : I was told that had an abnormal protein.  HPI: [name] is a very nice 46 year old African-American male.  He comes in with his wife.  He has chronic renal failure.  He is on hemodialysis.  He started this in May 2023.  He has had multiple AV fistulas placed.  There is diabetes for at least 10 years.  He is originally from [place].  He is not on any insulin.  He is on oral agents.  He has neuropathy.  Again I am not sure why this just is not neuropathy from his diabetes.  He is also on hemodialysis which I suspect could also cause diabetes.  He is referred to neurology.  They did their battery of neuropathy test.  Of course, they found that he had a monoclonal spike.  This was not measurable.  This was a IgG kappa spike.  Based on this, he was referred to the [place].  He smokes 2 packs a day.  I do not think he drinks.  He has had no problems with bowels although he does have little constipation from dialysis.  He really does not make much urine.  There is no one in the family that has a blood problem.  I think he has had a grandmother had breast cancer.  He has had some studies done in the past.  He had normal IgG and IgM levels.  I think the IgA may have been a little bit high.  He has had normal vitamin B-12 and vitamin B-1.  He has had no obvious bleeding.  Has had no leg swelling.  He is on gabapentin.  He is not a vegetarian.  He has had no problems with his appetite over the holidays.  He does state that the dialysis does make his appetite a little bit lower.  Overall, I would have said that his performance status is probably ECOG 1.    Past Medical History:  Diagnosis Date   Anemia    Depression    Diabetes mellitus without  complication ([place])    Type II- no meds   Hypertension    Renal disorder   :   Past Surgical History:  Procedure Laterality Date   AV FISTULA PLACEMENT Left 03/26/2022   Procedure: ARTERIOVENOUS (AV) FISTULA CREATION;  Surgeon: Cherre Robins, MD;  Location: [place];  Service: Vascular;  Laterality: Left;   AV FISTULA PLACEMENT Left 05/22/2022   Procedure: FIRSTSTAGE BASILIC ARTERIOVENOUS  FISTULA CREATION;  Surgeon: Cherre Robins, MD;  Location: [place];  Service: Vascular;  Laterality: Left;   [place] Left 08/28/2022   Procedure: LEFT SECOND STAGE BASILIC VEIN TRANSPOSITION;  Surgeon: Cherre Robins, MD;  Location: [place];  Service: Vascular;  Laterality: Left;  PERIPHERAL NERVE BLOCK   INSERTION OF DIALYSIS CATHETER Right 03/26/2022   Procedure: INSERTION OF DIALYSIS CATHETER USING PALINDROME PRECISOIN CHRONIC CATHETER KIT (19cm);  Surgeon: Cherre Robins, MD;  Location: [place];  Service: Vascular;  Laterality: Right;   TOOTH EXTRACTION N/A 07/23/2022   Procedure: DENTAL RESTORATION/EXTRACTIONS;  Surgeon: Diona Browner, DMD;  Location: [place];  Service: Oral Surgery;  Laterality: N/A;  :   Current Outpatient Medications:    amLODipine (NORVASC) 10 MG tablet, Take 1 tablet (10 mg  total) by mouth daily., Disp: 90 tablet, Rfl: 3   blood glucose meter kit and supplies, Dispense based on patient and insurance preference. Use up to four times daily as directed. (FOR ICD-10 E10.9, E11.9)., Disp: 1 each, Rfl: 0   Blood Glucose Monitoring Suppl (ACCU-CHEK AVIVA PLUS) w/Device KIT, Use up to 4 times a day, Disp: 1 kit, Rfl: 0   carvedilol (COREG) 6.25 MG tablet, Take 1-2 tablets (6.25-12.5 mg total) by mouth 2 (two) times daily with a meal. (Patient taking differently: Take 6.25-12.5 mg by mouth See admin instructions. 12.5 mg in the morning, 6.25 mg in the evening), Disp: 270 tablet, Rfl: 3   gabapentin (NEURONTIN) 100 MG capsule, Take 3 capsules (300 mg total) by mouth 2 (two)  times daily., Disp: 540 capsule, Rfl: 3   glucose blood (ACCU-CHEK AVIVA PLUS) test strip, Use as instructed up to 4 times per day, Disp: 200 each, Rfl: 12   Insulin Pen Needle (PEN NEEDLES) 31G X 5 MM MISC, 1 Package by Does not apply route daily., Disp: 100 each, Rfl: 3   Lancets (ACCU-CHEK MULTICLIX) lancets, Use as instructed up to 4 times a day, Disp: 204 each, Rfl: 12   Multiple Vitamin (DAILY-VITE MULTIVITAMIN) TABS, Take 1 tablet by mouth daily with supper., Disp: , Rfl:    sevelamer carbonate (RENVELA) 800 MG tablet, Take 1 tablet (800 mg total) by mouth 3 (three) times daily with meals., Disp: 90 tablet, Rfl: 11   simvastatin (ZOCOR) 20 MG tablet, TAKE 1 TABLET(20 MG) BY MOUTH AT BEDTIME, Disp: 90 tablet, Rfl: 3:  :  No Known Allergies:   Family History  Problem Relation Age of Onset   High blood pressure Mother    Diabetes Maternal Grandfather   :   Social History   Socioeconomic History   Marital status: Married    Spouse name: Not on file   Number of children: Not on file   Years of education: Not on file   Highest education level: Not on file  Occupational History   Not on file  Tobacco Use   Smoking status: Every Day    Packs/day: 2.00    Years: 30.00    Total pack years: 60.00    Types: Cigarettes    Passive exposure: Current   Smokeless tobacco: Never   Tobacco comments:    2 packs a day  Vaping Use   Vaping Use: Never used  Substance and Sexual Activity   Alcohol use: No   Drug use: No   Sexual activity: Yes  Other Topics Concern   Not on file  Social History Narrative   Are you right handed or left handed? right   Are you currently employed ?    What is your current occupation?retired   Do you live at home alone?wife   Who lives with you?    What type of home do you live in: 1 story or 2 story? one       Social Determinants of Radio broadcast assistant Strain: Not on file  Food Insecurity: Not on file  Transportation Needs: Not on file   Physical Activity: Not on file  Stress: Not on file  Social Connections: Not on file  Intimate Partner Violence: Not on file  :  Review of Systems  Constitutional:  Positive for malaise/fatigue.  HENT: Negative.    Eyes: Negative.   Respiratory: Negative.    Cardiovascular: Negative.   Gastrointestinal: Negative.   Genitourinary: Negative.  Musculoskeletal: Negative.   Skin: Negative.   Neurological:  Positive for tingling.  Endo/Heme/Allergies: Negative.   Psychiatric/Behavioral: Negative.       Exam: Vital signs show temperature of 98.3.  Pulse 73.  Blood pressure 142/90.  Weight is 150 pounds.  _0 @ Physical Exam Vitals reviewed.  HENT:     Head: Normocephalic and atraumatic.  Eyes:     Pupils: Pupils are equal, round, and reactive to light.  Cardiovascular:     Rate and Rhythm: Normal rate and regular rhythm.     Heart sounds: Normal heart sounds.  Pulmonary:     Effort: Pulmonary effort is normal.     Breath sounds: Normal breath sounds.  Abdominal:     General: Bowel sounds are normal.     Palpations: Abdomen is soft.  Musculoskeletal:        General: No tenderness or deformity. Normal range of motion.     Cervical back: Normal range of motion.  Lymphadenopathy:     Cervical: No cervical adenopathy.  Skin:    General: Skin is warm and dry.     Findings: No erythema or rash.  Neurological:     Mental Status: He is alert and oriented to person, place, and time.  Psychiatric:        Behavior: Behavior normal.        Thought Content: Thought content normal.        Judgment: Judgment normal.     Recent Labs    10/30/22 1045  WBC 7.1  HGB 12.5*  HCT 37.3*  PLT 287    Recent Labs    10/30/22 1045  NA 136  K 4.1  CL 93*  CO2 33*  GLUCOSE 117*  BUN 28*  CREATININE 8.92*  CALCIUM 9.0    Blood smear review: Normochromic and normocytic population of red blood cells.  There are no nucleated red blood cells.  I see no rouleaux formation.   There are no teardrop cells.  I see no schistocytes.  White blood cells.  Normal morphology maturation.  There are no immature myeloid or lymphoid forms.  I see no hypersegmented polys.  Platelets are adequate in number and size.  Pathology: None    Assessment and Plan: Mr. Bramer is a very nice 46 year old African-American male.  He has diabetes.  He has chronic renal failure on dialysis.  It sounds like he may consider going for kidney transplant next year.  I am really not surprised that he has a monoclonal spike.  Again there is no measurement of this.  We will have to see what our labs show.  I would be hard to believe that this monoclonal spike is a cause of his neuropathy.  I am not sure why his neuropathy just cannot can be from diabetes.  Again I would think that this would be the most likely source.  Again, we cannot do a 24-hour urine on him since he really does not make urine.  We will see what his monoclonal studies show.  I cannot imagine that we would have to do a bone marrow test on him.  I would think though that if he is going to be a kidney transplant candidate, that he will need to have a bone marrow biopsy done as part of the workup.  He is on gabapentin.  I would think this would be appropriate for the neuropathy.  At this point, we will have to see what his labs show.  I would hopefully  not had to have him come back.  If we do need to have him come back, it probably will be in the Spring.  He was enjoyable talking to he and his wife.  He has a very interesting past.

## 2022-10-31 DIAGNOSIS — N186 End stage renal disease: Secondary | ICD-10-CM | POA: Diagnosis not present

## 2022-10-31 DIAGNOSIS — N2581 Secondary hyperparathyroidism of renal origin: Secondary | ICD-10-CM | POA: Diagnosis not present

## 2022-10-31 DIAGNOSIS — D689 Coagulation defect, unspecified: Secondary | ICD-10-CM | POA: Diagnosis not present

## 2022-10-31 DIAGNOSIS — Z992 Dependence on renal dialysis: Secondary | ICD-10-CM | POA: Diagnosis not present

## 2022-10-31 DIAGNOSIS — D631 Anemia in chronic kidney disease: Secondary | ICD-10-CM | POA: Diagnosis not present

## 2022-10-31 LAB — KAPPA/LAMBDA LIGHT CHAINS
Kappa free light chain: 297 mg/L — ABNORMAL HIGH (ref 3.3–19.4)
Kappa, lambda light chain ratio: 3.78 — ABNORMAL HIGH (ref 0.26–1.65)
Lambda free light chains: 78.6 mg/L — ABNORMAL HIGH (ref 5.7–26.3)

## 2022-10-31 LAB — IGG, IGA, IGM
IgA: 308 mg/dL (ref 90–386)
IgG (Immunoglobin G), Serum: 1394 mg/dL (ref 603–1613)
IgM (Immunoglobulin M), Srm: 223 mg/dL — ABNORMAL HIGH (ref 20–172)

## 2022-10-31 LAB — BETA 2 MICROGLOBULIN, SERUM: Beta-2 Microglobulin: 14.7 mg/L — ABNORMAL HIGH (ref 0.6–2.4)

## 2022-11-02 DIAGNOSIS — E1122 Type 2 diabetes mellitus with diabetic chronic kidney disease: Secondary | ICD-10-CM | POA: Diagnosis not present

## 2022-11-02 DIAGNOSIS — N186 End stage renal disease: Secondary | ICD-10-CM | POA: Diagnosis not present

## 2022-11-02 DIAGNOSIS — Z992 Dependence on renal dialysis: Secondary | ICD-10-CM | POA: Diagnosis not present

## 2022-11-05 DIAGNOSIS — Z992 Dependence on renal dialysis: Secondary | ICD-10-CM | POA: Diagnosis not present

## 2022-11-05 DIAGNOSIS — E1122 Type 2 diabetes mellitus with diabetic chronic kidney disease: Secondary | ICD-10-CM | POA: Diagnosis not present

## 2022-11-05 DIAGNOSIS — N186 End stage renal disease: Secondary | ICD-10-CM | POA: Diagnosis not present

## 2022-11-05 DIAGNOSIS — D689 Coagulation defect, unspecified: Secondary | ICD-10-CM | POA: Diagnosis not present

## 2022-11-05 DIAGNOSIS — N2581 Secondary hyperparathyroidism of renal origin: Secondary | ICD-10-CM | POA: Diagnosis not present

## 2022-11-05 DIAGNOSIS — D631 Anemia in chronic kidney disease: Secondary | ICD-10-CM | POA: Diagnosis not present

## 2022-11-05 DIAGNOSIS — D509 Iron deficiency anemia, unspecified: Secondary | ICD-10-CM | POA: Diagnosis not present

## 2022-11-07 DIAGNOSIS — D509 Iron deficiency anemia, unspecified: Secondary | ICD-10-CM | POA: Diagnosis not present

## 2022-11-07 DIAGNOSIS — D631 Anemia in chronic kidney disease: Secondary | ICD-10-CM | POA: Diagnosis not present

## 2022-11-07 DIAGNOSIS — Z992 Dependence on renal dialysis: Secondary | ICD-10-CM | POA: Diagnosis not present

## 2022-11-07 DIAGNOSIS — N2581 Secondary hyperparathyroidism of renal origin: Secondary | ICD-10-CM | POA: Diagnosis not present

## 2022-11-07 DIAGNOSIS — N186 End stage renal disease: Secondary | ICD-10-CM | POA: Diagnosis not present

## 2022-11-07 DIAGNOSIS — E1122 Type 2 diabetes mellitus with diabetic chronic kidney disease: Secondary | ICD-10-CM | POA: Diagnosis not present

## 2022-11-07 DIAGNOSIS — D689 Coagulation defect, unspecified: Secondary | ICD-10-CM | POA: Diagnosis not present

## 2022-11-07 LAB — PROTEIN ELECTROPHORESIS, SERUM, WITH REFLEX
A/G Ratio: 1.3 (ref 0.7–1.7)
Albumin ELP: 4.3 g/dL (ref 2.9–4.4)
Alpha-1-Globulin: 0.2 g/dL (ref 0.0–0.4)
Alpha-2-Globulin: 0.7 g/dL (ref 0.4–1.0)
Beta Globulin: 0.8 g/dL (ref 0.7–1.3)
Gamma Globulin: 1.5 g/dL (ref 0.4–1.8)
Globulin, Total: 3.2 g/dL (ref 2.2–3.9)
M-Spike, %: 0.4 g/dL — ABNORMAL HIGH
SPEP Interpretation: 0
Total Protein ELP: 7.5 g/dL (ref 6.0–8.5)

## 2022-11-07 LAB — IMMUNOFIXATION REFLEX, SERUM
IgA: 327 mg/dL (ref 90–386)
IgG (Immunoglobin G), Serum: 1490 mg/dL (ref 603–1613)
IgM (Immunoglobulin M), Srm: 224 mg/dL — ABNORMAL HIGH (ref 20–172)

## 2022-11-12 DIAGNOSIS — D689 Coagulation defect, unspecified: Secondary | ICD-10-CM | POA: Diagnosis not present

## 2022-11-12 DIAGNOSIS — N2581 Secondary hyperparathyroidism of renal origin: Secondary | ICD-10-CM | POA: Diagnosis not present

## 2022-11-12 DIAGNOSIS — N186 End stage renal disease: Secondary | ICD-10-CM | POA: Diagnosis not present

## 2022-11-12 DIAGNOSIS — E1122 Type 2 diabetes mellitus with diabetic chronic kidney disease: Secondary | ICD-10-CM | POA: Diagnosis not present

## 2022-11-12 DIAGNOSIS — D509 Iron deficiency anemia, unspecified: Secondary | ICD-10-CM | POA: Diagnosis not present

## 2022-11-12 DIAGNOSIS — Z992 Dependence on renal dialysis: Secondary | ICD-10-CM | POA: Diagnosis not present

## 2022-11-12 DIAGNOSIS — D631 Anemia in chronic kidney disease: Secondary | ICD-10-CM | POA: Diagnosis not present

## 2022-11-14 DIAGNOSIS — N2581 Secondary hyperparathyroidism of renal origin: Secondary | ICD-10-CM | POA: Diagnosis not present

## 2022-11-14 DIAGNOSIS — D631 Anemia in chronic kidney disease: Secondary | ICD-10-CM | POA: Diagnosis not present

## 2022-11-14 DIAGNOSIS — E1122 Type 2 diabetes mellitus with diabetic chronic kidney disease: Secondary | ICD-10-CM | POA: Diagnosis not present

## 2022-11-14 DIAGNOSIS — N186 End stage renal disease: Secondary | ICD-10-CM | POA: Diagnosis not present

## 2022-11-14 DIAGNOSIS — Z992 Dependence on renal dialysis: Secondary | ICD-10-CM | POA: Diagnosis not present

## 2022-11-14 DIAGNOSIS — D689 Coagulation defect, unspecified: Secondary | ICD-10-CM | POA: Diagnosis not present

## 2022-11-14 DIAGNOSIS — D509 Iron deficiency anemia, unspecified: Secondary | ICD-10-CM | POA: Diagnosis not present

## 2022-11-17 DIAGNOSIS — D689 Coagulation defect, unspecified: Secondary | ICD-10-CM | POA: Diagnosis not present

## 2022-11-17 DIAGNOSIS — E1122 Type 2 diabetes mellitus with diabetic chronic kidney disease: Secondary | ICD-10-CM | POA: Diagnosis not present

## 2022-11-17 DIAGNOSIS — N186 End stage renal disease: Secondary | ICD-10-CM | POA: Diagnosis not present

## 2022-11-17 DIAGNOSIS — D631 Anemia in chronic kidney disease: Secondary | ICD-10-CM | POA: Diagnosis not present

## 2022-11-17 DIAGNOSIS — Z992 Dependence on renal dialysis: Secondary | ICD-10-CM | POA: Diagnosis not present

## 2022-11-17 DIAGNOSIS — N2581 Secondary hyperparathyroidism of renal origin: Secondary | ICD-10-CM | POA: Diagnosis not present

## 2022-11-17 DIAGNOSIS — D509 Iron deficiency anemia, unspecified: Secondary | ICD-10-CM | POA: Diagnosis not present

## 2022-11-19 DIAGNOSIS — D689 Coagulation defect, unspecified: Secondary | ICD-10-CM | POA: Diagnosis not present

## 2022-11-19 DIAGNOSIS — D509 Iron deficiency anemia, unspecified: Secondary | ICD-10-CM | POA: Diagnosis not present

## 2022-11-19 DIAGNOSIS — D631 Anemia in chronic kidney disease: Secondary | ICD-10-CM | POA: Diagnosis not present

## 2022-11-19 DIAGNOSIS — E1122 Type 2 diabetes mellitus with diabetic chronic kidney disease: Secondary | ICD-10-CM | POA: Diagnosis not present

## 2022-11-19 DIAGNOSIS — Z992 Dependence on renal dialysis: Secondary | ICD-10-CM | POA: Diagnosis not present

## 2022-11-19 DIAGNOSIS — N2581 Secondary hyperparathyroidism of renal origin: Secondary | ICD-10-CM | POA: Diagnosis not present

## 2022-11-19 DIAGNOSIS — N186 End stage renal disease: Secondary | ICD-10-CM | POA: Diagnosis not present

## 2022-11-20 DIAGNOSIS — E113311 Type 2 diabetes mellitus with moderate nonproliferative diabetic retinopathy with macular edema, right eye: Secondary | ICD-10-CM | POA: Diagnosis not present

## 2022-11-20 DIAGNOSIS — E113392 Type 2 diabetes mellitus with moderate nonproliferative diabetic retinopathy without macular edema, left eye: Secondary | ICD-10-CM | POA: Diagnosis not present

## 2022-11-20 DIAGNOSIS — I10 Essential (primary) hypertension: Secondary | ICD-10-CM | POA: Diagnosis not present

## 2022-11-21 DIAGNOSIS — E1122 Type 2 diabetes mellitus with diabetic chronic kidney disease: Secondary | ICD-10-CM | POA: Diagnosis not present

## 2022-11-21 DIAGNOSIS — D509 Iron deficiency anemia, unspecified: Secondary | ICD-10-CM | POA: Diagnosis not present

## 2022-11-21 DIAGNOSIS — N2581 Secondary hyperparathyroidism of renal origin: Secondary | ICD-10-CM | POA: Diagnosis not present

## 2022-11-21 DIAGNOSIS — D689 Coagulation defect, unspecified: Secondary | ICD-10-CM | POA: Diagnosis not present

## 2022-11-21 DIAGNOSIS — D631 Anemia in chronic kidney disease: Secondary | ICD-10-CM | POA: Diagnosis not present

## 2022-11-21 DIAGNOSIS — N186 End stage renal disease: Secondary | ICD-10-CM | POA: Diagnosis not present

## 2022-11-21 DIAGNOSIS — Z992 Dependence on renal dialysis: Secondary | ICD-10-CM | POA: Diagnosis not present

## 2022-11-24 DIAGNOSIS — E1122 Type 2 diabetes mellitus with diabetic chronic kidney disease: Secondary | ICD-10-CM | POA: Diagnosis not present

## 2022-11-24 DIAGNOSIS — D631 Anemia in chronic kidney disease: Secondary | ICD-10-CM | POA: Diagnosis not present

## 2022-11-24 DIAGNOSIS — D509 Iron deficiency anemia, unspecified: Secondary | ICD-10-CM | POA: Diagnosis not present

## 2022-11-24 DIAGNOSIS — N186 End stage renal disease: Secondary | ICD-10-CM | POA: Diagnosis not present

## 2022-11-24 DIAGNOSIS — D689 Coagulation defect, unspecified: Secondary | ICD-10-CM | POA: Diagnosis not present

## 2022-11-24 DIAGNOSIS — N2581 Secondary hyperparathyroidism of renal origin: Secondary | ICD-10-CM | POA: Diagnosis not present

## 2022-11-24 DIAGNOSIS — Z992 Dependence on renal dialysis: Secondary | ICD-10-CM | POA: Diagnosis not present

## 2022-11-25 ENCOUNTER — Ambulatory Visit: Payer: 59 | Admitting: Neurology

## 2022-11-25 ENCOUNTER — Telehealth: Payer: Self-pay | Admitting: Neurology

## 2022-11-25 DIAGNOSIS — N186 End stage renal disease: Secondary | ICD-10-CM

## 2022-11-25 DIAGNOSIS — M545 Low back pain, unspecified: Secondary | ICD-10-CM

## 2022-11-25 DIAGNOSIS — E1142 Type 2 diabetes mellitus with diabetic polyneuropathy: Secondary | ICD-10-CM

## 2022-11-25 DIAGNOSIS — R200 Anesthesia of skin: Secondary | ICD-10-CM

## 2022-11-25 NOTE — Telephone Encounter (Signed)
Patient and wife showed up for EMG appointment today, but did not want EMG unless spouse could come back with patient. My nurse and I both explained the space limitations with our EMG room and that to safely do the procedure, we do not allow observers back. Wife and patient were upset and did not want to do EMG with the wife stating she did not want "the police to be called" if patient went back by himself. Patient did not want to do EMG stating that he didn't need it. Due to the aggressive nature of the interaction, I felt this was the best course of action.  Levi Levins, MD [facility] Neurology

## 2022-11-26 DIAGNOSIS — D509 Iron deficiency anemia, unspecified: Secondary | ICD-10-CM | POA: Diagnosis not present

## 2022-11-26 DIAGNOSIS — N186 End stage renal disease: Secondary | ICD-10-CM | POA: Diagnosis not present

## 2022-11-26 DIAGNOSIS — N2581 Secondary hyperparathyroidism of renal origin: Secondary | ICD-10-CM | POA: Diagnosis not present

## 2022-11-26 DIAGNOSIS — Z992 Dependence on renal dialysis: Secondary | ICD-10-CM | POA: Diagnosis not present

## 2022-11-26 DIAGNOSIS — D689 Coagulation defect, unspecified: Secondary | ICD-10-CM | POA: Diagnosis not present

## 2022-11-26 DIAGNOSIS — D631 Anemia in chronic kidney disease: Secondary | ICD-10-CM | POA: Diagnosis not present

## 2022-11-26 DIAGNOSIS — E1122 Type 2 diabetes mellitus with diabetic chronic kidney disease: Secondary | ICD-10-CM | POA: Diagnosis not present

## 2022-11-27 NOTE — Telephone Encounter (Signed)
Received fax from Dr Corliss Parish for CVC removal. Will give to [facility].

## 2022-11-28 DIAGNOSIS — D509 Iron deficiency anemia, unspecified: Secondary | ICD-10-CM | POA: Diagnosis not present

## 2022-11-28 DIAGNOSIS — N186 End stage renal disease: Secondary | ICD-10-CM | POA: Diagnosis not present

## 2022-11-28 DIAGNOSIS — D689 Coagulation defect, unspecified: Secondary | ICD-10-CM | POA: Diagnosis not present

## 2022-11-28 DIAGNOSIS — N2581 Secondary hyperparathyroidism of renal origin: Secondary | ICD-10-CM | POA: Diagnosis not present

## 2022-11-28 DIAGNOSIS — Z992 Dependence on renal dialysis: Secondary | ICD-10-CM | POA: Diagnosis not present

## 2022-11-28 DIAGNOSIS — E1122 Type 2 diabetes mellitus with diabetic chronic kidney disease: Secondary | ICD-10-CM | POA: Diagnosis not present

## 2022-11-28 DIAGNOSIS — D631 Anemia in chronic kidney disease: Secondary | ICD-10-CM | POA: Diagnosis not present

## 2022-12-01 ENCOUNTER — Telehealth: Payer: Self-pay

## 2022-12-01 DIAGNOSIS — D509 Iron deficiency anemia, unspecified: Secondary | ICD-10-CM | POA: Diagnosis not present

## 2022-12-01 DIAGNOSIS — N2581 Secondary hyperparathyroidism of renal origin: Secondary | ICD-10-CM | POA: Diagnosis not present

## 2022-12-01 DIAGNOSIS — N186 End stage renal disease: Secondary | ICD-10-CM | POA: Diagnosis not present

## 2022-12-01 DIAGNOSIS — D631 Anemia in chronic kidney disease: Secondary | ICD-10-CM | POA: Diagnosis not present

## 2022-12-01 DIAGNOSIS — Z992 Dependence on renal dialysis: Secondary | ICD-10-CM | POA: Diagnosis not present

## 2022-12-01 DIAGNOSIS — D689 Coagulation defect, unspecified: Secondary | ICD-10-CM | POA: Diagnosis not present

## 2022-12-01 DIAGNOSIS — E1122 Type 2 diabetes mellitus with diabetic chronic kidney disease: Secondary | ICD-10-CM | POA: Diagnosis not present

## 2022-12-01 NOTE — Telephone Encounter (Signed)
Received fax from Dr Corliss Parish regarding CVC removal. Spoke with patient regarding scheduling procedurel. Patient states he will not have transportation until after 12/05/22 and if possible to schedule on next week. Advised patient, will call back with appointment information.   Left voicemail message for [facility] to return call regarding need to schedule appointment.

## 2022-12-02 ENCOUNTER — Other Ambulatory Visit: Payer: Self-pay

## 2022-12-02 NOTE — Telephone Encounter (Signed)
Patient scheduled for [facility] removal on 12/09/22. Instructions provided to patient. He verbalized understanding.

## 2022-12-03 DIAGNOSIS — D631 Anemia in chronic kidney disease: Secondary | ICD-10-CM | POA: Diagnosis not present

## 2022-12-03 DIAGNOSIS — E1122 Type 2 diabetes mellitus with diabetic chronic kidney disease: Secondary | ICD-10-CM | POA: Diagnosis not present

## 2022-12-03 DIAGNOSIS — Z992 Dependence on renal dialysis: Secondary | ICD-10-CM | POA: Diagnosis not present

## 2022-12-03 DIAGNOSIS — D509 Iron deficiency anemia, unspecified: Secondary | ICD-10-CM | POA: Diagnosis not present

## 2022-12-03 DIAGNOSIS — D689 Coagulation defect, unspecified: Secondary | ICD-10-CM | POA: Diagnosis not present

## 2022-12-03 DIAGNOSIS — N2581 Secondary hyperparathyroidism of renal origin: Secondary | ICD-10-CM | POA: Diagnosis not present

## 2022-12-03 DIAGNOSIS — N186 End stage renal disease: Secondary | ICD-10-CM | POA: Diagnosis not present

## 2022-12-05 DIAGNOSIS — N186 End stage renal disease: Secondary | ICD-10-CM | POA: Diagnosis not present

## 2022-12-05 DIAGNOSIS — Z992 Dependence on renal dialysis: Secondary | ICD-10-CM | POA: Diagnosis not present

## 2022-12-05 DIAGNOSIS — D509 Iron deficiency anemia, unspecified: Secondary | ICD-10-CM | POA: Diagnosis not present

## 2022-12-05 DIAGNOSIS — D631 Anemia in chronic kidney disease: Secondary | ICD-10-CM | POA: Diagnosis not present

## 2022-12-05 DIAGNOSIS — D689 Coagulation defect, unspecified: Secondary | ICD-10-CM | POA: Diagnosis not present

## 2022-12-05 DIAGNOSIS — N2581 Secondary hyperparathyroidism of renal origin: Secondary | ICD-10-CM | POA: Diagnosis not present

## 2022-12-05 DIAGNOSIS — E1122 Type 2 diabetes mellitus with diabetic chronic kidney disease: Secondary | ICD-10-CM | POA: Diagnosis not present

## 2022-12-08 DIAGNOSIS — D689 Coagulation defect, unspecified: Secondary | ICD-10-CM | POA: Diagnosis not present

## 2022-12-08 DIAGNOSIS — D631 Anemia in chronic kidney disease: Secondary | ICD-10-CM | POA: Diagnosis not present

## 2022-12-08 DIAGNOSIS — N2581 Secondary hyperparathyroidism of renal origin: Secondary | ICD-10-CM | POA: Diagnosis not present

## 2022-12-08 DIAGNOSIS — N186 End stage renal disease: Secondary | ICD-10-CM | POA: Diagnosis not present

## 2022-12-08 DIAGNOSIS — Z992 Dependence on renal dialysis: Secondary | ICD-10-CM | POA: Diagnosis not present

## 2022-12-08 DIAGNOSIS — E1122 Type 2 diabetes mellitus with diabetic chronic kidney disease: Secondary | ICD-10-CM | POA: Diagnosis not present

## 2022-12-08 DIAGNOSIS — D509 Iron deficiency anemia, unspecified: Secondary | ICD-10-CM | POA: Diagnosis not present

## 2022-12-09 ENCOUNTER — Ambulatory Visit (HOSPITAL_COMMUNITY)
Admission: RE | Admit: 2022-12-09 | Discharge: 2022-12-09 | Disposition: A | Discharge status: Home / Self Care | Payer: 59 | Source: Ambulatory Visit | Attending: Surgery | Admitting: Surgery

## 2022-12-09 DIAGNOSIS — N186 End stage renal disease: Secondary | ICD-10-CM | POA: Insufficient documentation

## 2022-12-09 MED ORDER — LIDOCAINE HCL (PF) 2 % IJ SOLN
INTRAMUSCULAR | Status: AC
  Filled 2022-12-09: qty 10

## 2022-12-09 NOTE — Procedures (Signed)
[facility] Catheter Removal Procedure Note   Diagnosis: ESRD with Functioning AVF/AVGG   Plan:  Remove right diatek catheter   Consent signed:  Yes.   Time out completed:  Yes.   Coumadin:  No. PT/INR (if applicable):   Other labs:     Procedure: 1.  Sterile prepping and draping over catheter area 2. 5 ml 2% lidocaine plain instilled at removal site. 3.  right catheter removed in its entirety with cuff in tact. 4.  Complications: none  5. Tip of catheter sent for culture:  No.     Patient tolerated procedure well:  Yes.   Pressure held, no bleeding noted, dressing applied Instructions given to the pt regarding wound care and bleeding.   Other:   Orvilla Truett, PA-C  [facility] Catheter Removal Instructions   Please sit up at least 30 degrees for 4 hours then may lay flat   MAY REMOVE DRESSINGS IN AM AND MAY SHOWER   REPLACE DRESSING OVER EXIT SITE AS NEEDED   IF YOU SHOULD NOTE BLEEDING OR SWELLING AT THE NECK HOLD PRESSURE OVER THE DRESSINGS FOR 15 MINUTES, IF BLEEDING PERSISTS COME TO ER OR CALL 911

## 2022-12-12 DIAGNOSIS — E1122 Type 2 diabetes mellitus with diabetic chronic kidney disease: Secondary | ICD-10-CM | POA: Diagnosis not present

## 2022-12-12 DIAGNOSIS — N2581 Secondary hyperparathyroidism of renal origin: Secondary | ICD-10-CM | POA: Diagnosis not present

## 2022-12-12 DIAGNOSIS — D631 Anemia in chronic kidney disease: Secondary | ICD-10-CM | POA: Diagnosis not present

## 2022-12-12 DIAGNOSIS — N186 End stage renal disease: Secondary | ICD-10-CM | POA: Diagnosis not present

## 2022-12-12 DIAGNOSIS — Z992 Dependence on renal dialysis: Secondary | ICD-10-CM | POA: Diagnosis not present

## 2022-12-12 DIAGNOSIS — D509 Iron deficiency anemia, unspecified: Secondary | ICD-10-CM | POA: Diagnosis not present

## 2022-12-12 DIAGNOSIS — D689 Coagulation defect, unspecified: Secondary | ICD-10-CM | POA: Diagnosis not present

## 2022-12-15 DIAGNOSIS — E1122 Type 2 diabetes mellitus with diabetic chronic kidney disease: Secondary | ICD-10-CM | POA: Diagnosis not present

## 2022-12-15 DIAGNOSIS — D631 Anemia in chronic kidney disease: Secondary | ICD-10-CM | POA: Diagnosis not present

## 2022-12-15 DIAGNOSIS — N2581 Secondary hyperparathyroidism of renal origin: Secondary | ICD-10-CM | POA: Diagnosis not present

## 2022-12-15 DIAGNOSIS — D509 Iron deficiency anemia, unspecified: Secondary | ICD-10-CM | POA: Diagnosis not present

## 2022-12-15 DIAGNOSIS — Z992 Dependence on renal dialysis: Secondary | ICD-10-CM | POA: Diagnosis not present

## 2022-12-15 DIAGNOSIS — D689 Coagulation defect, unspecified: Secondary | ICD-10-CM | POA: Diagnosis not present

## 2022-12-15 DIAGNOSIS — N186 End stage renal disease: Secondary | ICD-10-CM | POA: Diagnosis not present

## 2022-12-17 DIAGNOSIS — D509 Iron deficiency anemia, unspecified: Secondary | ICD-10-CM | POA: Diagnosis not present

## 2022-12-17 DIAGNOSIS — D631 Anemia in chronic kidney disease: Secondary | ICD-10-CM | POA: Diagnosis not present

## 2022-12-17 DIAGNOSIS — N2581 Secondary hyperparathyroidism of renal origin: Secondary | ICD-10-CM | POA: Diagnosis not present

## 2022-12-17 DIAGNOSIS — E1122 Type 2 diabetes mellitus with diabetic chronic kidney disease: Secondary | ICD-10-CM | POA: Diagnosis not present

## 2022-12-17 DIAGNOSIS — Z992 Dependence on renal dialysis: Secondary | ICD-10-CM | POA: Diagnosis not present

## 2022-12-17 DIAGNOSIS — N186 End stage renal disease: Secondary | ICD-10-CM | POA: Diagnosis not present

## 2022-12-17 DIAGNOSIS — D689 Coagulation defect, unspecified: Secondary | ICD-10-CM | POA: Diagnosis not present

## 2022-12-19 DIAGNOSIS — D509 Iron deficiency anemia, unspecified: Secondary | ICD-10-CM | POA: Diagnosis not present

## 2022-12-19 DIAGNOSIS — D689 Coagulation defect, unspecified: Secondary | ICD-10-CM | POA: Diagnosis not present

## 2022-12-19 DIAGNOSIS — N186 End stage renal disease: Secondary | ICD-10-CM | POA: Diagnosis not present

## 2022-12-19 DIAGNOSIS — E1122 Type 2 diabetes mellitus with diabetic chronic kidney disease: Secondary | ICD-10-CM | POA: Diagnosis not present

## 2022-12-19 DIAGNOSIS — D631 Anemia in chronic kidney disease: Secondary | ICD-10-CM | POA: Diagnosis not present

## 2022-12-19 DIAGNOSIS — Z992 Dependence on renal dialysis: Secondary | ICD-10-CM | POA: Diagnosis not present

## 2022-12-19 DIAGNOSIS — N2581 Secondary hyperparathyroidism of renal origin: Secondary | ICD-10-CM | POA: Diagnosis not present

## 2022-12-22 DIAGNOSIS — D631 Anemia in chronic kidney disease: Secondary | ICD-10-CM | POA: Diagnosis not present

## 2022-12-22 DIAGNOSIS — E1122 Type 2 diabetes mellitus with diabetic chronic kidney disease: Secondary | ICD-10-CM | POA: Diagnosis not present

## 2022-12-22 DIAGNOSIS — Z992 Dependence on renal dialysis: Secondary | ICD-10-CM | POA: Diagnosis not present

## 2022-12-22 DIAGNOSIS — N186 End stage renal disease: Secondary | ICD-10-CM | POA: Diagnosis not present

## 2022-12-22 DIAGNOSIS — D689 Coagulation defect, unspecified: Secondary | ICD-10-CM | POA: Diagnosis not present

## 2022-12-22 DIAGNOSIS — N2581 Secondary hyperparathyroidism of renal origin: Secondary | ICD-10-CM | POA: Diagnosis not present

## 2022-12-22 DIAGNOSIS — D509 Iron deficiency anemia, unspecified: Secondary | ICD-10-CM | POA: Diagnosis not present

## 2022-12-24 DIAGNOSIS — D509 Iron deficiency anemia, unspecified: Secondary | ICD-10-CM | POA: Diagnosis not present

## 2022-12-24 DIAGNOSIS — N2581 Secondary hyperparathyroidism of renal origin: Secondary | ICD-10-CM | POA: Diagnosis not present

## 2022-12-24 DIAGNOSIS — N186 End stage renal disease: Secondary | ICD-10-CM | POA: Diagnosis not present

## 2022-12-24 DIAGNOSIS — Z992 Dependence on renal dialysis: Secondary | ICD-10-CM | POA: Diagnosis not present

## 2022-12-24 DIAGNOSIS — D689 Coagulation defect, unspecified: Secondary | ICD-10-CM | POA: Diagnosis not present

## 2022-12-24 DIAGNOSIS — D631 Anemia in chronic kidney disease: Secondary | ICD-10-CM | POA: Diagnosis not present

## 2022-12-24 DIAGNOSIS — E1122 Type 2 diabetes mellitus with diabetic chronic kidney disease: Secondary | ICD-10-CM | POA: Diagnosis not present

## 2022-12-26 DIAGNOSIS — D689 Coagulation defect, unspecified: Secondary | ICD-10-CM | POA: Diagnosis not present

## 2022-12-26 DIAGNOSIS — N186 End stage renal disease: Secondary | ICD-10-CM | POA: Diagnosis not present

## 2022-12-26 DIAGNOSIS — N2581 Secondary hyperparathyroidism of renal origin: Secondary | ICD-10-CM | POA: Diagnosis not present

## 2022-12-26 DIAGNOSIS — D509 Iron deficiency anemia, unspecified: Secondary | ICD-10-CM | POA: Diagnosis not present

## 2022-12-26 DIAGNOSIS — D631 Anemia in chronic kidney disease: Secondary | ICD-10-CM | POA: Diagnosis not present

## 2022-12-26 DIAGNOSIS — Z992 Dependence on renal dialysis: Secondary | ICD-10-CM | POA: Diagnosis not present

## 2022-12-26 DIAGNOSIS — E1122 Type 2 diabetes mellitus with diabetic chronic kidney disease: Secondary | ICD-10-CM | POA: Diagnosis not present

## 2022-12-29 DIAGNOSIS — D631 Anemia in chronic kidney disease: Secondary | ICD-10-CM | POA: Diagnosis not present

## 2022-12-29 DIAGNOSIS — D689 Coagulation defect, unspecified: Secondary | ICD-10-CM | POA: Diagnosis not present

## 2022-12-29 DIAGNOSIS — E1122 Type 2 diabetes mellitus with diabetic chronic kidney disease: Secondary | ICD-10-CM | POA: Diagnosis not present

## 2022-12-29 DIAGNOSIS — N2581 Secondary hyperparathyroidism of renal origin: Secondary | ICD-10-CM | POA: Diagnosis not present

## 2022-12-29 DIAGNOSIS — Z992 Dependence on renal dialysis: Secondary | ICD-10-CM | POA: Diagnosis not present

## 2022-12-29 DIAGNOSIS — D509 Iron deficiency anemia, unspecified: Secondary | ICD-10-CM | POA: Diagnosis not present

## 2022-12-29 DIAGNOSIS — N186 End stage renal disease: Secondary | ICD-10-CM | POA: Diagnosis not present

## 2022-12-31 DIAGNOSIS — D631 Anemia in chronic kidney disease: Secondary | ICD-10-CM | POA: Diagnosis not present

## 2022-12-31 DIAGNOSIS — D509 Iron deficiency anemia, unspecified: Secondary | ICD-10-CM | POA: Diagnosis not present

## 2022-12-31 DIAGNOSIS — D689 Coagulation defect, unspecified: Secondary | ICD-10-CM | POA: Diagnosis not present

## 2022-12-31 DIAGNOSIS — N186 End stage renal disease: Secondary | ICD-10-CM | POA: Diagnosis not present

## 2022-12-31 DIAGNOSIS — Z992 Dependence on renal dialysis: Secondary | ICD-10-CM | POA: Diagnosis not present

## 2022-12-31 DIAGNOSIS — E1122 Type 2 diabetes mellitus with diabetic chronic kidney disease: Secondary | ICD-10-CM | POA: Diagnosis not present

## 2022-12-31 DIAGNOSIS — N2581 Secondary hyperparathyroidism of renal origin: Secondary | ICD-10-CM | POA: Diagnosis not present

## 2023-01-01 DIAGNOSIS — N186 End stage renal disease: Secondary | ICD-10-CM | POA: Diagnosis not present

## 2023-01-01 DIAGNOSIS — Z992 Dependence on renal dialysis: Secondary | ICD-10-CM | POA: Diagnosis not present

## 2023-01-01 DIAGNOSIS — E1122 Type 2 diabetes mellitus with diabetic chronic kidney disease: Secondary | ICD-10-CM | POA: Diagnosis not present

## 2023-01-02 DIAGNOSIS — Z992 Dependence on renal dialysis: Secondary | ICD-10-CM | POA: Diagnosis not present

## 2023-01-02 DIAGNOSIS — D689 Coagulation defect, unspecified: Secondary | ICD-10-CM | POA: Diagnosis not present

## 2023-01-02 DIAGNOSIS — N186 End stage renal disease: Secondary | ICD-10-CM | POA: Diagnosis not present

## 2023-01-02 DIAGNOSIS — E1122 Type 2 diabetes mellitus with diabetic chronic kidney disease: Secondary | ICD-10-CM | POA: Diagnosis not present

## 2023-01-02 DIAGNOSIS — D631 Anemia in chronic kidney disease: Secondary | ICD-10-CM | POA: Diagnosis not present

## 2023-01-02 DIAGNOSIS — N2581 Secondary hyperparathyroidism of renal origin: Secondary | ICD-10-CM | POA: Diagnosis not present

## 2023-01-02 DIAGNOSIS — D509 Iron deficiency anemia, unspecified: Secondary | ICD-10-CM | POA: Diagnosis not present

## 2023-01-05 DIAGNOSIS — D689 Coagulation defect, unspecified: Secondary | ICD-10-CM | POA: Diagnosis not present

## 2023-01-05 DIAGNOSIS — D631 Anemia in chronic kidney disease: Secondary | ICD-10-CM | POA: Diagnosis not present

## 2023-01-05 DIAGNOSIS — D509 Iron deficiency anemia, unspecified: Secondary | ICD-10-CM | POA: Diagnosis not present

## 2023-01-05 DIAGNOSIS — E1122 Type 2 diabetes mellitus with diabetic chronic kidney disease: Secondary | ICD-10-CM | POA: Diagnosis not present

## 2023-01-05 DIAGNOSIS — N2581 Secondary hyperparathyroidism of renal origin: Secondary | ICD-10-CM | POA: Diagnosis not present

## 2023-01-05 DIAGNOSIS — Z992 Dependence on renal dialysis: Secondary | ICD-10-CM | POA: Diagnosis not present

## 2023-01-05 DIAGNOSIS — N186 End stage renal disease: Secondary | ICD-10-CM | POA: Diagnosis not present

## 2023-01-07 DIAGNOSIS — Z992 Dependence on renal dialysis: Secondary | ICD-10-CM | POA: Diagnosis not present

## 2023-01-07 DIAGNOSIS — N2581 Secondary hyperparathyroidism of renal origin: Secondary | ICD-10-CM | POA: Diagnosis not present

## 2023-01-07 DIAGNOSIS — D631 Anemia in chronic kidney disease: Secondary | ICD-10-CM | POA: Diagnosis not present

## 2023-01-07 DIAGNOSIS — D509 Iron deficiency anemia, unspecified: Secondary | ICD-10-CM | POA: Diagnosis not present

## 2023-01-07 DIAGNOSIS — D689 Coagulation defect, unspecified: Secondary | ICD-10-CM | POA: Diagnosis not present

## 2023-01-07 DIAGNOSIS — E1122 Type 2 diabetes mellitus with diabetic chronic kidney disease: Secondary | ICD-10-CM | POA: Diagnosis not present

## 2023-01-07 DIAGNOSIS — N186 End stage renal disease: Secondary | ICD-10-CM | POA: Diagnosis not present

## 2023-01-09 DIAGNOSIS — N2581 Secondary hyperparathyroidism of renal origin: Secondary | ICD-10-CM | POA: Diagnosis not present

## 2023-01-09 DIAGNOSIS — N186 End stage renal disease: Secondary | ICD-10-CM | POA: Diagnosis not present

## 2023-01-09 DIAGNOSIS — D509 Iron deficiency anemia, unspecified: Secondary | ICD-10-CM | POA: Diagnosis not present

## 2023-01-09 DIAGNOSIS — E1122 Type 2 diabetes mellitus with diabetic chronic kidney disease: Secondary | ICD-10-CM | POA: Diagnosis not present

## 2023-01-09 DIAGNOSIS — D689 Coagulation defect, unspecified: Secondary | ICD-10-CM | POA: Diagnosis not present

## 2023-01-09 DIAGNOSIS — Z992 Dependence on renal dialysis: Secondary | ICD-10-CM | POA: Diagnosis not present

## 2023-01-09 DIAGNOSIS — D631 Anemia in chronic kidney disease: Secondary | ICD-10-CM | POA: Diagnosis not present

## 2023-01-12 DIAGNOSIS — D509 Iron deficiency anemia, unspecified: Secondary | ICD-10-CM | POA: Diagnosis not present

## 2023-01-12 DIAGNOSIS — N2581 Secondary hyperparathyroidism of renal origin: Secondary | ICD-10-CM | POA: Diagnosis not present

## 2023-01-12 DIAGNOSIS — D631 Anemia in chronic kidney disease: Secondary | ICD-10-CM | POA: Diagnosis not present

## 2023-01-12 DIAGNOSIS — Z992 Dependence on renal dialysis: Secondary | ICD-10-CM | POA: Diagnosis not present

## 2023-01-12 DIAGNOSIS — N186 End stage renal disease: Secondary | ICD-10-CM | POA: Diagnosis not present

## 2023-01-12 DIAGNOSIS — D689 Coagulation defect, unspecified: Secondary | ICD-10-CM | POA: Diagnosis not present

## 2023-01-12 DIAGNOSIS — E1122 Type 2 diabetes mellitus with diabetic chronic kidney disease: Secondary | ICD-10-CM | POA: Diagnosis not present

## 2023-01-12 IMAGING — CT CT HEAD W/O CM
1 series · 15 of 30 positions shown, 19 images · non-contrast
Comparison: None Available.

CLINICAL DATA: Neuro deficit, acute, stroke suspected

Numbness in both legs.  Headaches for 2 weeks.
EXAM:
CT HEAD WITHOUT CONTRAST
TECHNIQUE: Contiguous axial images were obtained from the base of the skull
through the vertex without intravenous contrast.
RADIATION DOSE REDUCTION: This exam was performed according to the
departmental dose-optimization program which includes automated
exposure control, adjustment of the mA and/or kV according to
patient size and/or use of iterative reconstruction technique.

[Series 2: head w/(date) · axial · 0.47mm/px · z∈[+1100,+1245]mm · 15 of 33 slices shown, 19 images]
[im 2/33  brain]
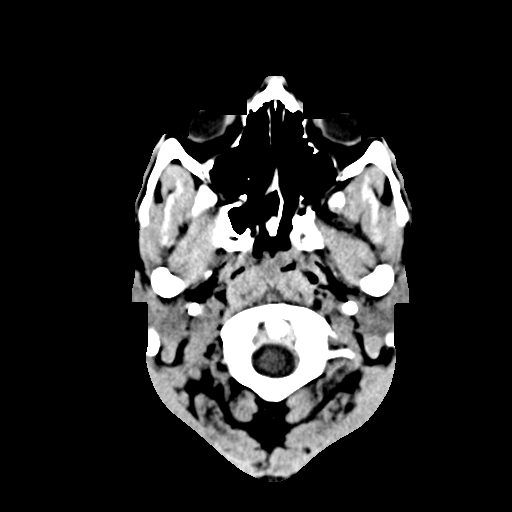
[im 2/33  bone]
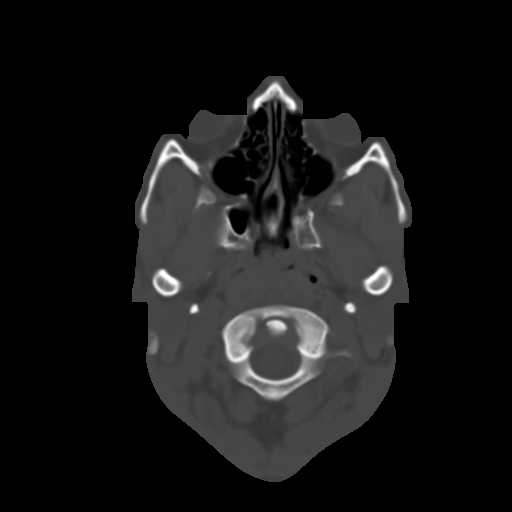
[im 4/33  brain]
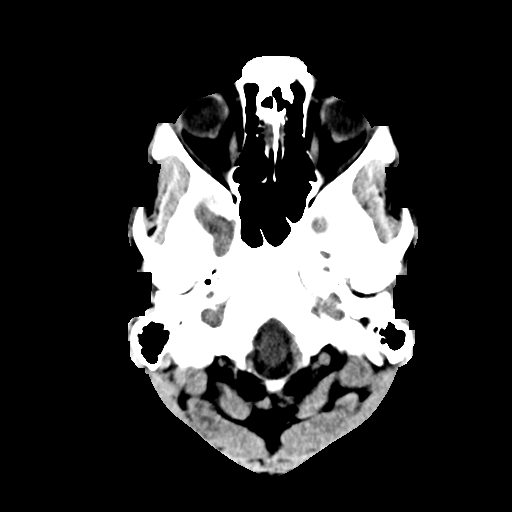
[im 6/33  brain]
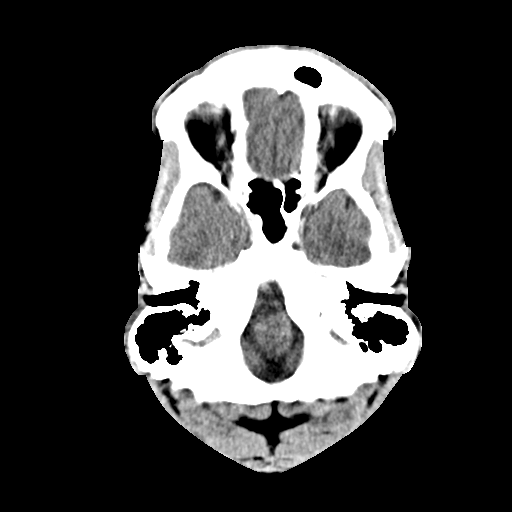
[im 8/33  brain]
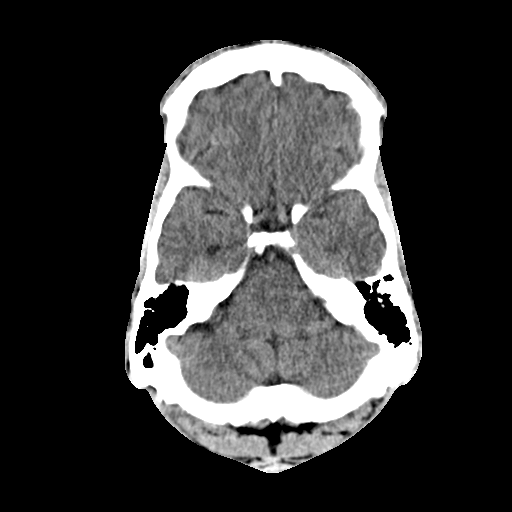
[im 10/33  brain]
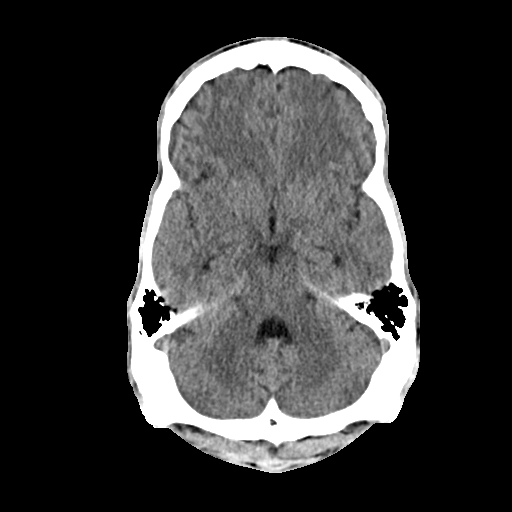
[im 10/33  bone]
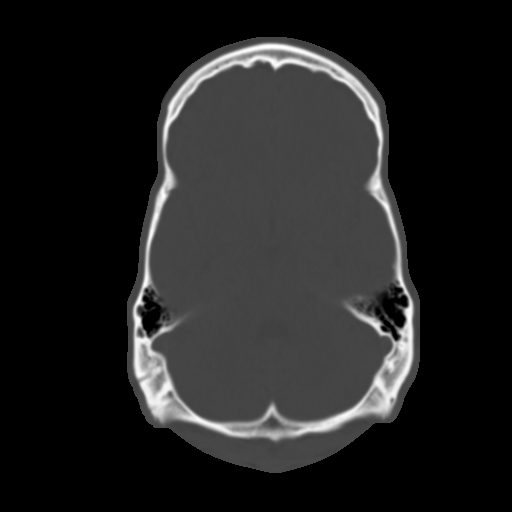
[im 13/33  brain]
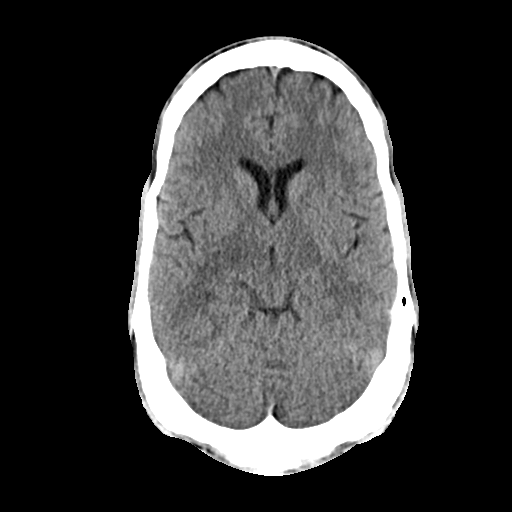
[im 15/33  brain]
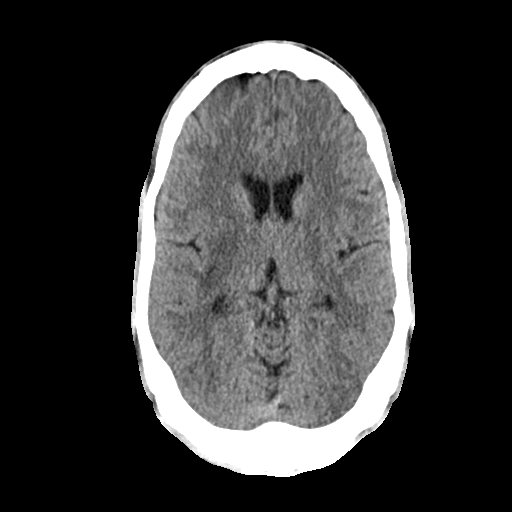
[im 17/33  brain]
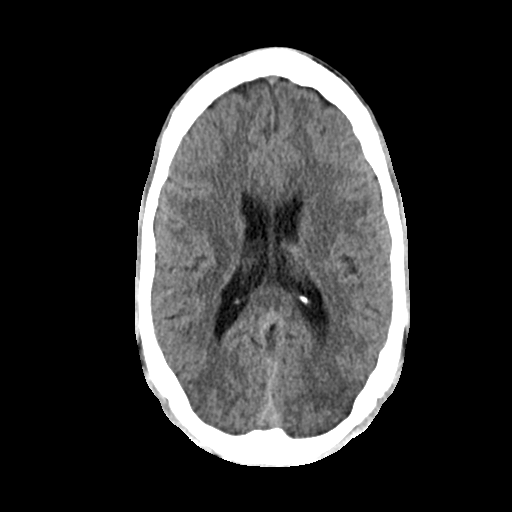
[im 18/33  brain]
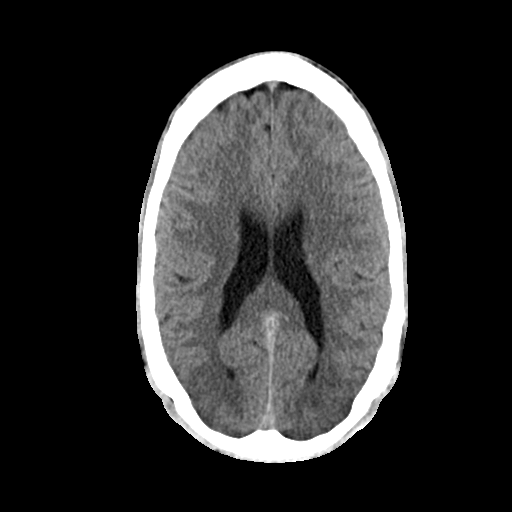
[im 18/33  bone]
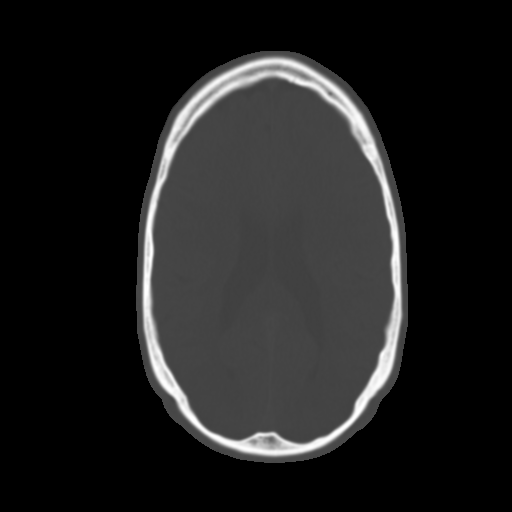
[im 20/33  brain]
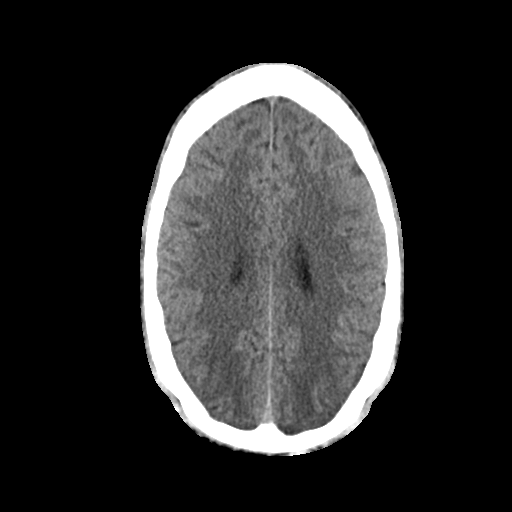
[im 23/33  brain]
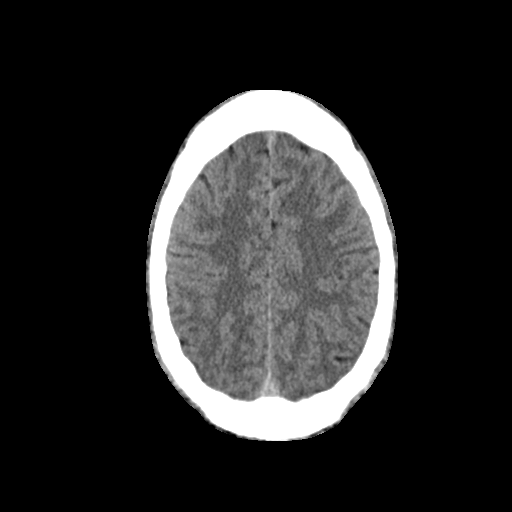
[im 25/33  brain]
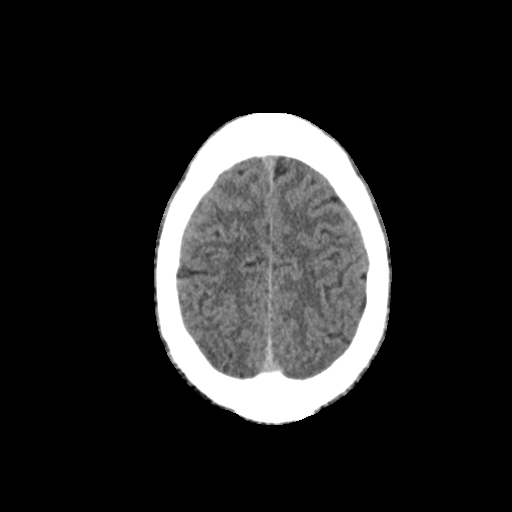
[im 27/33  brain]
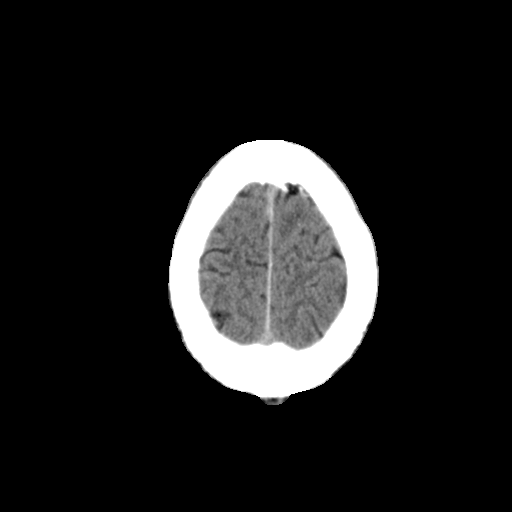
[im 27/33  bone]
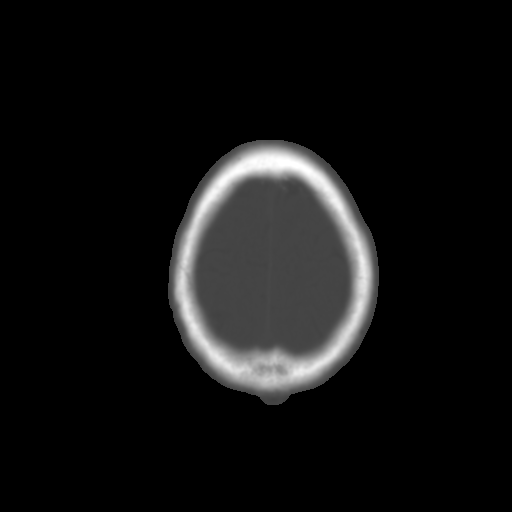
[im 29/33  brain]
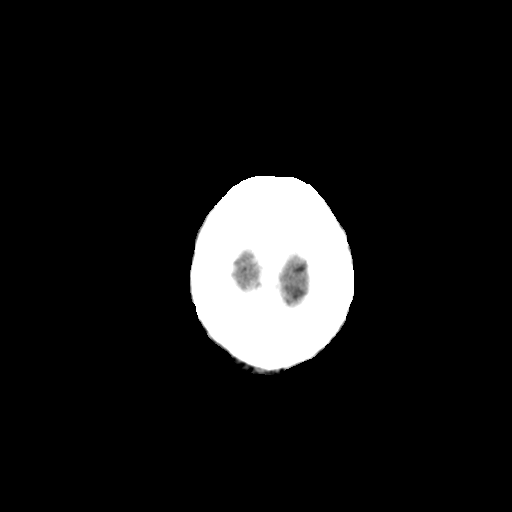
[im 31/33  brain]
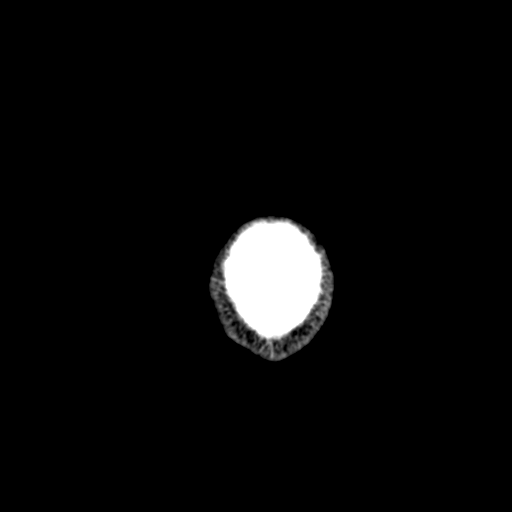

[15 of 30 positions shown; findings below may reference images not displayed]

FINDINGS: Brain: No intracranial hemorrhage, mass effect, or midline shift.
Brain volume is normal for age. No hydrocephalus. The basilar
cisterns are patent. No evidence of territorial infarct or acute
ischemia. No encephalomalacia to suggest prior infarct. No
extra-axial or intracranial fluid collection.

Vascular: Mild atherosclerosis of the carotid siphons. No hyperdense
vessel.

Skull: No fracture or focal lesion.

Sinuses/Orbits: Paranasal sinuses and mastoid air cells are clear.
The visualized orbits are unremarkable.

Other: None.
IMPRESSION: Negative noncontrast head CT.  No explanation for symptoms.

## 2023-01-16 DIAGNOSIS — D509 Iron deficiency anemia, unspecified: Secondary | ICD-10-CM | POA: Diagnosis not present

## 2023-01-16 DIAGNOSIS — D631 Anemia in chronic kidney disease: Secondary | ICD-10-CM | POA: Diagnosis not present

## 2023-01-16 DIAGNOSIS — D689 Coagulation defect, unspecified: Secondary | ICD-10-CM | POA: Diagnosis not present

## 2023-01-16 DIAGNOSIS — Z992 Dependence on renal dialysis: Secondary | ICD-10-CM | POA: Diagnosis not present

## 2023-01-16 DIAGNOSIS — N2581 Secondary hyperparathyroidism of renal origin: Secondary | ICD-10-CM | POA: Diagnosis not present

## 2023-01-16 DIAGNOSIS — E1122 Type 2 diabetes mellitus with diabetic chronic kidney disease: Secondary | ICD-10-CM | POA: Diagnosis not present

## 2023-01-16 DIAGNOSIS — N186 End stage renal disease: Secondary | ICD-10-CM | POA: Diagnosis not present

## 2023-01-19 DIAGNOSIS — D631 Anemia in chronic kidney disease: Secondary | ICD-10-CM | POA: Diagnosis not present

## 2023-01-19 DIAGNOSIS — N2581 Secondary hyperparathyroidism of renal origin: Secondary | ICD-10-CM | POA: Diagnosis not present

## 2023-01-19 DIAGNOSIS — D689 Coagulation defect, unspecified: Secondary | ICD-10-CM | POA: Diagnosis not present

## 2023-01-19 DIAGNOSIS — E1122 Type 2 diabetes mellitus with diabetic chronic kidney disease: Secondary | ICD-10-CM | POA: Diagnosis not present

## 2023-01-19 DIAGNOSIS — N186 End stage renal disease: Secondary | ICD-10-CM | POA: Diagnosis not present

## 2023-01-19 DIAGNOSIS — D509 Iron deficiency anemia, unspecified: Secondary | ICD-10-CM | POA: Diagnosis not present

## 2023-01-19 DIAGNOSIS — Z992 Dependence on renal dialysis: Secondary | ICD-10-CM | POA: Diagnosis not present

## 2023-01-21 DIAGNOSIS — E1122 Type 2 diabetes mellitus with diabetic chronic kidney disease: Secondary | ICD-10-CM | POA: Diagnosis not present

## 2023-01-21 DIAGNOSIS — D509 Iron deficiency anemia, unspecified: Secondary | ICD-10-CM | POA: Diagnosis not present

## 2023-01-21 DIAGNOSIS — D689 Coagulation defect, unspecified: Secondary | ICD-10-CM | POA: Diagnosis not present

## 2023-01-21 DIAGNOSIS — Z992 Dependence on renal dialysis: Secondary | ICD-10-CM | POA: Diagnosis not present

## 2023-01-21 DIAGNOSIS — N2581 Secondary hyperparathyroidism of renal origin: Secondary | ICD-10-CM | POA: Diagnosis not present

## 2023-01-21 DIAGNOSIS — D631 Anemia in chronic kidney disease: Secondary | ICD-10-CM | POA: Diagnosis not present

## 2023-01-21 DIAGNOSIS — N186 End stage renal disease: Secondary | ICD-10-CM | POA: Diagnosis not present

## 2023-01-23 DIAGNOSIS — D689 Coagulation defect, unspecified: Secondary | ICD-10-CM | POA: Diagnosis not present

## 2023-01-23 DIAGNOSIS — N186 End stage renal disease: Secondary | ICD-10-CM | POA: Diagnosis not present

## 2023-01-23 DIAGNOSIS — Z992 Dependence on renal dialysis: Secondary | ICD-10-CM | POA: Diagnosis not present

## 2023-01-23 DIAGNOSIS — E1122 Type 2 diabetes mellitus with diabetic chronic kidney disease: Secondary | ICD-10-CM | POA: Diagnosis not present

## 2023-01-23 DIAGNOSIS — D509 Iron deficiency anemia, unspecified: Secondary | ICD-10-CM | POA: Diagnosis not present

## 2023-01-23 DIAGNOSIS — D631 Anemia in chronic kidney disease: Secondary | ICD-10-CM | POA: Diagnosis not present

## 2023-01-23 DIAGNOSIS — N2581 Secondary hyperparathyroidism of renal origin: Secondary | ICD-10-CM | POA: Diagnosis not present

## 2023-01-26 DIAGNOSIS — N186 End stage renal disease: Secondary | ICD-10-CM | POA: Diagnosis not present

## 2023-01-26 DIAGNOSIS — E1122 Type 2 diabetes mellitus with diabetic chronic kidney disease: Secondary | ICD-10-CM | POA: Diagnosis not present

## 2023-01-26 DIAGNOSIS — D631 Anemia in chronic kidney disease: Secondary | ICD-10-CM | POA: Diagnosis not present

## 2023-01-26 DIAGNOSIS — N2581 Secondary hyperparathyroidism of renal origin: Secondary | ICD-10-CM | POA: Diagnosis not present

## 2023-01-26 DIAGNOSIS — D509 Iron deficiency anemia, unspecified: Secondary | ICD-10-CM | POA: Diagnosis not present

## 2023-01-26 DIAGNOSIS — Z992 Dependence on renal dialysis: Secondary | ICD-10-CM | POA: Diagnosis not present

## 2023-01-26 DIAGNOSIS — D689 Coagulation defect, unspecified: Secondary | ICD-10-CM | POA: Diagnosis not present

## 2023-01-28 ENCOUNTER — Ambulatory Visit: Payer: 59 | Admitting: Podiatry

## 2023-01-28 DIAGNOSIS — Z992 Dependence on renal dialysis: Secondary | ICD-10-CM | POA: Diagnosis not present

## 2023-01-28 DIAGNOSIS — N186 End stage renal disease: Secondary | ICD-10-CM | POA: Diagnosis not present

## 2023-01-28 DIAGNOSIS — D631 Anemia in chronic kidney disease: Secondary | ICD-10-CM | POA: Diagnosis not present

## 2023-01-28 DIAGNOSIS — D689 Coagulation defect, unspecified: Secondary | ICD-10-CM | POA: Diagnosis not present

## 2023-01-28 DIAGNOSIS — N2581 Secondary hyperparathyroidism of renal origin: Secondary | ICD-10-CM | POA: Diagnosis not present

## 2023-01-28 DIAGNOSIS — D509 Iron deficiency anemia, unspecified: Secondary | ICD-10-CM | POA: Diagnosis not present

## 2023-01-28 DIAGNOSIS — E1122 Type 2 diabetes mellitus with diabetic chronic kidney disease: Secondary | ICD-10-CM | POA: Diagnosis not present

## 2023-01-29 ENCOUNTER — Other Ambulatory Visit: Payer: Self-pay | Admitting: Internal Medicine

## 2023-01-30 DIAGNOSIS — D509 Iron deficiency anemia, unspecified: Secondary | ICD-10-CM | POA: Diagnosis not present

## 2023-01-30 DIAGNOSIS — N186 End stage renal disease: Secondary | ICD-10-CM | POA: Diagnosis not present

## 2023-01-30 DIAGNOSIS — Z992 Dependence on renal dialysis: Secondary | ICD-10-CM | POA: Diagnosis not present

## 2023-01-30 DIAGNOSIS — D689 Coagulation defect, unspecified: Secondary | ICD-10-CM | POA: Diagnosis not present

## 2023-01-30 DIAGNOSIS — E1122 Type 2 diabetes mellitus with diabetic chronic kidney disease: Secondary | ICD-10-CM | POA: Diagnosis not present

## 2023-01-30 DIAGNOSIS — N2581 Secondary hyperparathyroidism of renal origin: Secondary | ICD-10-CM | POA: Diagnosis not present

## 2023-01-30 DIAGNOSIS — D631 Anemia in chronic kidney disease: Secondary | ICD-10-CM | POA: Diagnosis not present

## 2023-02-01 DIAGNOSIS — E1122 Type 2 diabetes mellitus with diabetic chronic kidney disease: Secondary | ICD-10-CM | POA: Diagnosis not present

## 2023-02-01 DIAGNOSIS — Z992 Dependence on renal dialysis: Secondary | ICD-10-CM | POA: Diagnosis not present

## 2023-02-01 DIAGNOSIS — N186 End stage renal disease: Secondary | ICD-10-CM | POA: Diagnosis not present

## 2023-02-02 DIAGNOSIS — D631 Anemia in chronic kidney disease: Secondary | ICD-10-CM | POA: Diagnosis not present

## 2023-02-02 DIAGNOSIS — N186 End stage renal disease: Secondary | ICD-10-CM | POA: Diagnosis not present

## 2023-02-02 DIAGNOSIS — D689 Coagulation defect, unspecified: Secondary | ICD-10-CM | POA: Diagnosis not present

## 2023-02-02 DIAGNOSIS — Z992 Dependence on renal dialysis: Secondary | ICD-10-CM | POA: Diagnosis not present

## 2023-02-02 DIAGNOSIS — E1122 Type 2 diabetes mellitus with diabetic chronic kidney disease: Secondary | ICD-10-CM | POA: Diagnosis not present

## 2023-02-02 DIAGNOSIS — D509 Iron deficiency anemia, unspecified: Secondary | ICD-10-CM | POA: Diagnosis not present

## 2023-02-02 DIAGNOSIS — N2581 Secondary hyperparathyroidism of renal origin: Secondary | ICD-10-CM | POA: Diagnosis not present

## 2023-02-04 DIAGNOSIS — Z992 Dependence on renal dialysis: Secondary | ICD-10-CM | POA: Diagnosis not present

## 2023-02-04 DIAGNOSIS — D631 Anemia in chronic kidney disease: Secondary | ICD-10-CM | POA: Diagnosis not present

## 2023-02-04 DIAGNOSIS — D509 Iron deficiency anemia, unspecified: Secondary | ICD-10-CM | POA: Diagnosis not present

## 2023-02-04 DIAGNOSIS — N2581 Secondary hyperparathyroidism of renal origin: Secondary | ICD-10-CM | POA: Diagnosis not present

## 2023-02-04 DIAGNOSIS — N186 End stage renal disease: Secondary | ICD-10-CM | POA: Diagnosis not present

## 2023-02-04 DIAGNOSIS — E1122 Type 2 diabetes mellitus with diabetic chronic kidney disease: Secondary | ICD-10-CM | POA: Diagnosis not present

## 2023-02-04 DIAGNOSIS — D689 Coagulation defect, unspecified: Secondary | ICD-10-CM | POA: Diagnosis not present

## 2023-02-06 DIAGNOSIS — N2581 Secondary hyperparathyroidism of renal origin: Secondary | ICD-10-CM | POA: Diagnosis not present

## 2023-02-06 DIAGNOSIS — Z992 Dependence on renal dialysis: Secondary | ICD-10-CM | POA: Diagnosis not present

## 2023-02-06 DIAGNOSIS — D689 Coagulation defect, unspecified: Secondary | ICD-10-CM | POA: Diagnosis not present

## 2023-02-06 DIAGNOSIS — N186 End stage renal disease: Secondary | ICD-10-CM | POA: Diagnosis not present

## 2023-02-06 DIAGNOSIS — D631 Anemia in chronic kidney disease: Secondary | ICD-10-CM | POA: Diagnosis not present

## 2023-02-06 DIAGNOSIS — D509 Iron deficiency anemia, unspecified: Secondary | ICD-10-CM | POA: Diagnosis not present

## 2023-02-06 DIAGNOSIS — E1122 Type 2 diabetes mellitus with diabetic chronic kidney disease: Secondary | ICD-10-CM | POA: Diagnosis not present

## 2023-02-09 DIAGNOSIS — D689 Coagulation defect, unspecified: Secondary | ICD-10-CM | POA: Diagnosis not present

## 2023-02-09 DIAGNOSIS — Z992 Dependence on renal dialysis: Secondary | ICD-10-CM | POA: Diagnosis not present

## 2023-02-09 DIAGNOSIS — N186 End stage renal disease: Secondary | ICD-10-CM | POA: Diagnosis not present

## 2023-02-09 DIAGNOSIS — N2581 Secondary hyperparathyroidism of renal origin: Secondary | ICD-10-CM | POA: Diagnosis not present

## 2023-02-09 DIAGNOSIS — D509 Iron deficiency anemia, unspecified: Secondary | ICD-10-CM | POA: Diagnosis not present

## 2023-02-09 DIAGNOSIS — D631 Anemia in chronic kidney disease: Secondary | ICD-10-CM | POA: Diagnosis not present

## 2023-02-09 DIAGNOSIS — E1122 Type 2 diabetes mellitus with diabetic chronic kidney disease: Secondary | ICD-10-CM | POA: Diagnosis not present

## 2023-02-11 DIAGNOSIS — D631 Anemia in chronic kidney disease: Secondary | ICD-10-CM | POA: Diagnosis not present

## 2023-02-11 DIAGNOSIS — N186 End stage renal disease: Secondary | ICD-10-CM | POA: Diagnosis not present

## 2023-02-11 DIAGNOSIS — D509 Iron deficiency anemia, unspecified: Secondary | ICD-10-CM | POA: Diagnosis not present

## 2023-02-11 DIAGNOSIS — N2581 Secondary hyperparathyroidism of renal origin: Secondary | ICD-10-CM | POA: Diagnosis not present

## 2023-02-11 DIAGNOSIS — E1122 Type 2 diabetes mellitus with diabetic chronic kidney disease: Secondary | ICD-10-CM | POA: Diagnosis not present

## 2023-02-11 DIAGNOSIS — D689 Coagulation defect, unspecified: Secondary | ICD-10-CM | POA: Diagnosis not present

## 2023-02-11 DIAGNOSIS — Z992 Dependence on renal dialysis: Secondary | ICD-10-CM | POA: Diagnosis not present

## 2023-02-13 DIAGNOSIS — D689 Coagulation defect, unspecified: Secondary | ICD-10-CM | POA: Diagnosis not present

## 2023-02-13 DIAGNOSIS — D509 Iron deficiency anemia, unspecified: Secondary | ICD-10-CM | POA: Diagnosis not present

## 2023-02-13 DIAGNOSIS — N186 End stage renal disease: Secondary | ICD-10-CM | POA: Diagnosis not present

## 2023-02-13 DIAGNOSIS — D631 Anemia in chronic kidney disease: Secondary | ICD-10-CM | POA: Diagnosis not present

## 2023-02-13 DIAGNOSIS — E1122 Type 2 diabetes mellitus with diabetic chronic kidney disease: Secondary | ICD-10-CM | POA: Diagnosis not present

## 2023-02-13 DIAGNOSIS — N2581 Secondary hyperparathyroidism of renal origin: Secondary | ICD-10-CM | POA: Diagnosis not present

## 2023-02-13 DIAGNOSIS — Z992 Dependence on renal dialysis: Secondary | ICD-10-CM | POA: Diagnosis not present

## 2023-02-16 DIAGNOSIS — Z992 Dependence on renal dialysis: Secondary | ICD-10-CM | POA: Diagnosis not present

## 2023-02-16 DIAGNOSIS — E1122 Type 2 diabetes mellitus with diabetic chronic kidney disease: Secondary | ICD-10-CM | POA: Diagnosis not present

## 2023-02-16 DIAGNOSIS — D509 Iron deficiency anemia, unspecified: Secondary | ICD-10-CM | POA: Diagnosis not present

## 2023-02-16 DIAGNOSIS — D689 Coagulation defect, unspecified: Secondary | ICD-10-CM | POA: Diagnosis not present

## 2023-02-16 DIAGNOSIS — N2581 Secondary hyperparathyroidism of renal origin: Secondary | ICD-10-CM | POA: Diagnosis not present

## 2023-02-16 DIAGNOSIS — D631 Anemia in chronic kidney disease: Secondary | ICD-10-CM | POA: Diagnosis not present

## 2023-02-16 DIAGNOSIS — N186 End stage renal disease: Secondary | ICD-10-CM | POA: Diagnosis not present

## 2023-02-18 DIAGNOSIS — N186 End stage renal disease: Secondary | ICD-10-CM | POA: Diagnosis not present

## 2023-02-18 DIAGNOSIS — Z992 Dependence on renal dialysis: Secondary | ICD-10-CM | POA: Diagnosis not present

## 2023-02-18 DIAGNOSIS — D689 Coagulation defect, unspecified: Secondary | ICD-10-CM | POA: Diagnosis not present

## 2023-02-18 DIAGNOSIS — D509 Iron deficiency anemia, unspecified: Secondary | ICD-10-CM | POA: Diagnosis not present

## 2023-02-18 DIAGNOSIS — D631 Anemia in chronic kidney disease: Secondary | ICD-10-CM | POA: Diagnosis not present

## 2023-02-18 DIAGNOSIS — E1122 Type 2 diabetes mellitus with diabetic chronic kidney disease: Secondary | ICD-10-CM | POA: Diagnosis not present

## 2023-02-18 DIAGNOSIS — N2581 Secondary hyperparathyroidism of renal origin: Secondary | ICD-10-CM | POA: Diagnosis not present

## 2023-02-20 DIAGNOSIS — N2581 Secondary hyperparathyroidism of renal origin: Secondary | ICD-10-CM | POA: Diagnosis not present

## 2023-02-20 DIAGNOSIS — N186 End stage renal disease: Secondary | ICD-10-CM | POA: Diagnosis not present

## 2023-02-20 DIAGNOSIS — E1122 Type 2 diabetes mellitus with diabetic chronic kidney disease: Secondary | ICD-10-CM | POA: Diagnosis not present

## 2023-02-20 DIAGNOSIS — D509 Iron deficiency anemia, unspecified: Secondary | ICD-10-CM | POA: Diagnosis not present

## 2023-02-20 DIAGNOSIS — Z992 Dependence on renal dialysis: Secondary | ICD-10-CM | POA: Diagnosis not present

## 2023-02-20 DIAGNOSIS — D631 Anemia in chronic kidney disease: Secondary | ICD-10-CM | POA: Diagnosis not present

## 2023-02-20 DIAGNOSIS — D689 Coagulation defect, unspecified: Secondary | ICD-10-CM | POA: Diagnosis not present

## 2023-02-23 DIAGNOSIS — N2581 Secondary hyperparathyroidism of renal origin: Secondary | ICD-10-CM | POA: Diagnosis not present

## 2023-02-23 DIAGNOSIS — E1122 Type 2 diabetes mellitus with diabetic chronic kidney disease: Secondary | ICD-10-CM | POA: Diagnosis not present

## 2023-02-23 DIAGNOSIS — D689 Coagulation defect, unspecified: Secondary | ICD-10-CM | POA: Diagnosis not present

## 2023-02-23 DIAGNOSIS — Z992 Dependence on renal dialysis: Secondary | ICD-10-CM | POA: Diagnosis not present

## 2023-02-23 DIAGNOSIS — N186 End stage renal disease: Secondary | ICD-10-CM | POA: Diagnosis not present

## 2023-02-23 DIAGNOSIS — D631 Anemia in chronic kidney disease: Secondary | ICD-10-CM | POA: Diagnosis not present

## 2023-02-23 DIAGNOSIS — D509 Iron deficiency anemia, unspecified: Secondary | ICD-10-CM | POA: Diagnosis not present

## 2023-02-25 DIAGNOSIS — N2581 Secondary hyperparathyroidism of renal origin: Secondary | ICD-10-CM | POA: Diagnosis not present

## 2023-02-25 DIAGNOSIS — D631 Anemia in chronic kidney disease: Secondary | ICD-10-CM | POA: Diagnosis not present

## 2023-02-25 DIAGNOSIS — E1122 Type 2 diabetes mellitus with diabetic chronic kidney disease: Secondary | ICD-10-CM | POA: Diagnosis not present

## 2023-02-25 DIAGNOSIS — Z992 Dependence on renal dialysis: Secondary | ICD-10-CM | POA: Diagnosis not present

## 2023-02-25 DIAGNOSIS — D509 Iron deficiency anemia, unspecified: Secondary | ICD-10-CM | POA: Diagnosis not present

## 2023-02-25 DIAGNOSIS — D689 Coagulation defect, unspecified: Secondary | ICD-10-CM | POA: Diagnosis not present

## 2023-02-25 DIAGNOSIS — N186 End stage renal disease: Secondary | ICD-10-CM | POA: Diagnosis not present

## 2023-02-27 DIAGNOSIS — N2581 Secondary hyperparathyroidism of renal origin: Secondary | ICD-10-CM | POA: Diagnosis not present

## 2023-02-27 DIAGNOSIS — Z992 Dependence on renal dialysis: Secondary | ICD-10-CM | POA: Diagnosis not present

## 2023-02-27 DIAGNOSIS — D509 Iron deficiency anemia, unspecified: Secondary | ICD-10-CM | POA: Diagnosis not present

## 2023-02-27 DIAGNOSIS — N186 End stage renal disease: Secondary | ICD-10-CM | POA: Diagnosis not present

## 2023-02-27 DIAGNOSIS — D689 Coagulation defect, unspecified: Secondary | ICD-10-CM | POA: Diagnosis not present

## 2023-02-27 DIAGNOSIS — E1122 Type 2 diabetes mellitus with diabetic chronic kidney disease: Secondary | ICD-10-CM | POA: Diagnosis not present

## 2023-02-27 DIAGNOSIS — D631 Anemia in chronic kidney disease: Secondary | ICD-10-CM | POA: Diagnosis not present

## 2023-03-02 DIAGNOSIS — D509 Iron deficiency anemia, unspecified: Secondary | ICD-10-CM | POA: Diagnosis not present

## 2023-03-02 DIAGNOSIS — D689 Coagulation defect, unspecified: Secondary | ICD-10-CM | POA: Diagnosis not present

## 2023-03-02 DIAGNOSIS — N2581 Secondary hyperparathyroidism of renal origin: Secondary | ICD-10-CM | POA: Diagnosis not present

## 2023-03-02 DIAGNOSIS — E1122 Type 2 diabetes mellitus with diabetic chronic kidney disease: Secondary | ICD-10-CM | POA: Diagnosis not present

## 2023-03-02 DIAGNOSIS — N186 End stage renal disease: Secondary | ICD-10-CM | POA: Diagnosis not present

## 2023-03-02 DIAGNOSIS — D631 Anemia in chronic kidney disease: Secondary | ICD-10-CM | POA: Diagnosis not present

## 2023-03-02 DIAGNOSIS — Z992 Dependence on renal dialysis: Secondary | ICD-10-CM | POA: Diagnosis not present

## 2023-03-03 DIAGNOSIS — N186 End stage renal disease: Secondary | ICD-10-CM | POA: Diagnosis not present

## 2023-03-03 DIAGNOSIS — E1122 Type 2 diabetes mellitus with diabetic chronic kidney disease: Secondary | ICD-10-CM | POA: Diagnosis not present

## 2023-03-03 DIAGNOSIS — Z992 Dependence on renal dialysis: Secondary | ICD-10-CM | POA: Diagnosis not present

## 2023-03-04 DIAGNOSIS — N2581 Secondary hyperparathyroidism of renal origin: Secondary | ICD-10-CM | POA: Diagnosis not present

## 2023-03-04 DIAGNOSIS — D631 Anemia in chronic kidney disease: Secondary | ICD-10-CM | POA: Diagnosis not present

## 2023-03-04 DIAGNOSIS — E1122 Type 2 diabetes mellitus with diabetic chronic kidney disease: Secondary | ICD-10-CM | POA: Diagnosis not present

## 2023-03-04 DIAGNOSIS — N186 End stage renal disease: Secondary | ICD-10-CM | POA: Diagnosis not present

## 2023-03-04 DIAGNOSIS — D689 Coagulation defect, unspecified: Secondary | ICD-10-CM | POA: Diagnosis not present

## 2023-03-04 DIAGNOSIS — Z992 Dependence on renal dialysis: Secondary | ICD-10-CM | POA: Diagnosis not present

## 2023-03-04 DIAGNOSIS — D509 Iron deficiency anemia, unspecified: Secondary | ICD-10-CM | POA: Diagnosis not present

## 2023-03-06 DIAGNOSIS — D631 Anemia in chronic kidney disease: Secondary | ICD-10-CM | POA: Diagnosis not present

## 2023-03-06 DIAGNOSIS — E1122 Type 2 diabetes mellitus with diabetic chronic kidney disease: Secondary | ICD-10-CM | POA: Diagnosis not present

## 2023-03-06 DIAGNOSIS — N2581 Secondary hyperparathyroidism of renal origin: Secondary | ICD-10-CM | POA: Diagnosis not present

## 2023-03-06 DIAGNOSIS — D509 Iron deficiency anemia, unspecified: Secondary | ICD-10-CM | POA: Diagnosis not present

## 2023-03-06 DIAGNOSIS — N186 End stage renal disease: Secondary | ICD-10-CM | POA: Diagnosis not present

## 2023-03-06 DIAGNOSIS — D689 Coagulation defect, unspecified: Secondary | ICD-10-CM | POA: Diagnosis not present

## 2023-03-06 DIAGNOSIS — Z992 Dependence on renal dialysis: Secondary | ICD-10-CM | POA: Diagnosis not present

## 2023-03-09 DIAGNOSIS — N2581 Secondary hyperparathyroidism of renal origin: Secondary | ICD-10-CM | POA: Diagnosis not present

## 2023-03-09 DIAGNOSIS — E1122 Type 2 diabetes mellitus with diabetic chronic kidney disease: Secondary | ICD-10-CM | POA: Diagnosis not present

## 2023-03-09 DIAGNOSIS — D509 Iron deficiency anemia, unspecified: Secondary | ICD-10-CM | POA: Diagnosis not present

## 2023-03-09 DIAGNOSIS — N186 End stage renal disease: Secondary | ICD-10-CM | POA: Diagnosis not present

## 2023-03-09 DIAGNOSIS — D689 Coagulation defect, unspecified: Secondary | ICD-10-CM | POA: Diagnosis not present

## 2023-03-09 DIAGNOSIS — D631 Anemia in chronic kidney disease: Secondary | ICD-10-CM | POA: Diagnosis not present

## 2023-03-09 DIAGNOSIS — Z992 Dependence on renal dialysis: Secondary | ICD-10-CM | POA: Diagnosis not present

## 2023-03-11 DIAGNOSIS — D631 Anemia in chronic kidney disease: Secondary | ICD-10-CM | POA: Diagnosis not present

## 2023-03-11 DIAGNOSIS — N2581 Secondary hyperparathyroidism of renal origin: Secondary | ICD-10-CM | POA: Diagnosis not present

## 2023-03-11 DIAGNOSIS — N186 End stage renal disease: Secondary | ICD-10-CM | POA: Diagnosis not present

## 2023-03-11 DIAGNOSIS — E1122 Type 2 diabetes mellitus with diabetic chronic kidney disease: Secondary | ICD-10-CM | POA: Diagnosis not present

## 2023-03-11 DIAGNOSIS — D509 Iron deficiency anemia, unspecified: Secondary | ICD-10-CM | POA: Diagnosis not present

## 2023-03-11 DIAGNOSIS — D689 Coagulation defect, unspecified: Secondary | ICD-10-CM | POA: Diagnosis not present

## 2023-03-11 DIAGNOSIS — Z992 Dependence on renal dialysis: Secondary | ICD-10-CM | POA: Diagnosis not present

## 2023-03-12 DIAGNOSIS — R310 Gross hematuria: Secondary | ICD-10-CM | POA: Diagnosis not present

## 2023-03-12 DIAGNOSIS — N186 End stage renal disease: Secondary | ICD-10-CM | POA: Diagnosis not present

## 2023-03-13 DIAGNOSIS — N186 End stage renal disease: Secondary | ICD-10-CM | POA: Diagnosis not present

## 2023-03-13 DIAGNOSIS — N2581 Secondary hyperparathyroidism of renal origin: Secondary | ICD-10-CM | POA: Diagnosis not present

## 2023-03-13 DIAGNOSIS — D631 Anemia in chronic kidney disease: Secondary | ICD-10-CM | POA: Diagnosis not present

## 2023-03-13 DIAGNOSIS — E1122 Type 2 diabetes mellitus with diabetic chronic kidney disease: Secondary | ICD-10-CM | POA: Diagnosis not present

## 2023-03-13 DIAGNOSIS — D689 Coagulation defect, unspecified: Secondary | ICD-10-CM | POA: Diagnosis not present

## 2023-03-13 DIAGNOSIS — Z992 Dependence on renal dialysis: Secondary | ICD-10-CM | POA: Diagnosis not present

## 2023-03-13 DIAGNOSIS — D509 Iron deficiency anemia, unspecified: Secondary | ICD-10-CM | POA: Diagnosis not present

## 2023-03-16 DIAGNOSIS — Z992 Dependence on renal dialysis: Secondary | ICD-10-CM | POA: Diagnosis not present

## 2023-03-16 DIAGNOSIS — N186 End stage renal disease: Secondary | ICD-10-CM | POA: Diagnosis not present

## 2023-03-16 DIAGNOSIS — E1122 Type 2 diabetes mellitus with diabetic chronic kidney disease: Secondary | ICD-10-CM | POA: Diagnosis not present

## 2023-03-16 DIAGNOSIS — D509 Iron deficiency anemia, unspecified: Secondary | ICD-10-CM | POA: Diagnosis not present

## 2023-03-16 DIAGNOSIS — N2581 Secondary hyperparathyroidism of renal origin: Secondary | ICD-10-CM | POA: Diagnosis not present

## 2023-03-16 DIAGNOSIS — D631 Anemia in chronic kidney disease: Secondary | ICD-10-CM | POA: Diagnosis not present

## 2023-03-16 DIAGNOSIS — D689 Coagulation defect, unspecified: Secondary | ICD-10-CM | POA: Diagnosis not present

## 2023-03-20 DIAGNOSIS — D689 Coagulation defect, unspecified: Secondary | ICD-10-CM | POA: Diagnosis not present

## 2023-03-20 DIAGNOSIS — N2581 Secondary hyperparathyroidism of renal origin: Secondary | ICD-10-CM | POA: Diagnosis not present

## 2023-03-20 DIAGNOSIS — E1122 Type 2 diabetes mellitus with diabetic chronic kidney disease: Secondary | ICD-10-CM | POA: Diagnosis not present

## 2023-03-20 DIAGNOSIS — Z992 Dependence on renal dialysis: Secondary | ICD-10-CM | POA: Diagnosis not present

## 2023-03-20 DIAGNOSIS — N186 End stage renal disease: Secondary | ICD-10-CM | POA: Diagnosis not present

## 2023-03-20 DIAGNOSIS — D509 Iron deficiency anemia, unspecified: Secondary | ICD-10-CM | POA: Diagnosis not present

## 2023-03-20 DIAGNOSIS — D631 Anemia in chronic kidney disease: Secondary | ICD-10-CM | POA: Diagnosis not present

## 2023-03-23 DIAGNOSIS — N186 End stage renal disease: Secondary | ICD-10-CM | POA: Diagnosis not present

## 2023-03-23 DIAGNOSIS — E1122 Type 2 diabetes mellitus with diabetic chronic kidney disease: Secondary | ICD-10-CM | POA: Diagnosis not present

## 2023-03-23 DIAGNOSIS — D689 Coagulation defect, unspecified: Secondary | ICD-10-CM | POA: Diagnosis not present

## 2023-03-23 DIAGNOSIS — D509 Iron deficiency anemia, unspecified: Secondary | ICD-10-CM | POA: Diagnosis not present

## 2023-03-23 DIAGNOSIS — D631 Anemia in chronic kidney disease: Secondary | ICD-10-CM | POA: Diagnosis not present

## 2023-03-23 DIAGNOSIS — N2581 Secondary hyperparathyroidism of renal origin: Secondary | ICD-10-CM | POA: Diagnosis not present

## 2023-03-23 DIAGNOSIS — Z992 Dependence on renal dialysis: Secondary | ICD-10-CM | POA: Diagnosis not present

## 2023-03-25 DIAGNOSIS — D631 Anemia in chronic kidney disease: Secondary | ICD-10-CM | POA: Diagnosis not present

## 2023-03-25 DIAGNOSIS — D509 Iron deficiency anemia, unspecified: Secondary | ICD-10-CM | POA: Diagnosis not present

## 2023-03-25 DIAGNOSIS — D689 Coagulation defect, unspecified: Secondary | ICD-10-CM | POA: Diagnosis not present

## 2023-03-25 DIAGNOSIS — E1122 Type 2 diabetes mellitus with diabetic chronic kidney disease: Secondary | ICD-10-CM | POA: Diagnosis not present

## 2023-03-25 DIAGNOSIS — N2581 Secondary hyperparathyroidism of renal origin: Secondary | ICD-10-CM | POA: Diagnosis not present

## 2023-03-25 DIAGNOSIS — Z992 Dependence on renal dialysis: Secondary | ICD-10-CM | POA: Diagnosis not present

## 2023-03-25 DIAGNOSIS — N186 End stage renal disease: Secondary | ICD-10-CM | POA: Diagnosis not present

## 2023-03-27 DIAGNOSIS — E1122 Type 2 diabetes mellitus with diabetic chronic kidney disease: Secondary | ICD-10-CM | POA: Diagnosis not present

## 2023-03-27 DIAGNOSIS — D631 Anemia in chronic kidney disease: Secondary | ICD-10-CM | POA: Diagnosis not present

## 2023-03-27 DIAGNOSIS — N186 End stage renal disease: Secondary | ICD-10-CM | POA: Diagnosis not present

## 2023-03-27 DIAGNOSIS — Z992 Dependence on renal dialysis: Secondary | ICD-10-CM | POA: Diagnosis not present

## 2023-03-27 DIAGNOSIS — N2581 Secondary hyperparathyroidism of renal origin: Secondary | ICD-10-CM | POA: Diagnosis not present

## 2023-03-27 DIAGNOSIS — D689 Coagulation defect, unspecified: Secondary | ICD-10-CM | POA: Diagnosis not present

## 2023-03-27 DIAGNOSIS — D509 Iron deficiency anemia, unspecified: Secondary | ICD-10-CM | POA: Diagnosis not present

## 2023-03-30 DIAGNOSIS — N2581 Secondary hyperparathyroidism of renal origin: Secondary | ICD-10-CM | POA: Diagnosis not present

## 2023-03-30 DIAGNOSIS — D689 Coagulation defect, unspecified: Secondary | ICD-10-CM | POA: Diagnosis not present

## 2023-03-30 DIAGNOSIS — N186 End stage renal disease: Secondary | ICD-10-CM | POA: Diagnosis not present

## 2023-03-30 DIAGNOSIS — Z992 Dependence on renal dialysis: Secondary | ICD-10-CM | POA: Diagnosis not present

## 2023-03-30 DIAGNOSIS — D509 Iron deficiency anemia, unspecified: Secondary | ICD-10-CM | POA: Diagnosis not present

## 2023-03-30 DIAGNOSIS — D631 Anemia in chronic kidney disease: Secondary | ICD-10-CM | POA: Diagnosis not present

## 2023-03-30 DIAGNOSIS — E1122 Type 2 diabetes mellitus with diabetic chronic kidney disease: Secondary | ICD-10-CM | POA: Diagnosis not present

## 2023-04-01 DIAGNOSIS — Z992 Dependence on renal dialysis: Secondary | ICD-10-CM | POA: Diagnosis not present

## 2023-04-01 DIAGNOSIS — D631 Anemia in chronic kidney disease: Secondary | ICD-10-CM | POA: Diagnosis not present

## 2023-04-01 DIAGNOSIS — D509 Iron deficiency anemia, unspecified: Secondary | ICD-10-CM | POA: Diagnosis not present

## 2023-04-01 DIAGNOSIS — N186 End stage renal disease: Secondary | ICD-10-CM | POA: Diagnosis not present

## 2023-04-01 DIAGNOSIS — E1122 Type 2 diabetes mellitus with diabetic chronic kidney disease: Secondary | ICD-10-CM | POA: Diagnosis not present

## 2023-04-01 DIAGNOSIS — N2581 Secondary hyperparathyroidism of renal origin: Secondary | ICD-10-CM | POA: Diagnosis not present

## 2023-04-01 DIAGNOSIS — D689 Coagulation defect, unspecified: Secondary | ICD-10-CM | POA: Diagnosis not present

## 2023-04-03 ENCOUNTER — Ambulatory Visit (INDEPENDENT_AMBULATORY_CARE_PROVIDER_SITE_OTHER): Payer: 59 | Admitting: Podiatry

## 2023-04-03 DIAGNOSIS — M79675 Pain in left toe(s): Secondary | ICD-10-CM

## 2023-04-03 DIAGNOSIS — B351 Tinea unguium: Secondary | ICD-10-CM

## 2023-04-03 DIAGNOSIS — D509 Iron deficiency anemia, unspecified: Secondary | ICD-10-CM | POA: Diagnosis not present

## 2023-04-03 DIAGNOSIS — D689 Coagulation defect, unspecified: Secondary | ICD-10-CM | POA: Diagnosis not present

## 2023-04-03 DIAGNOSIS — N2581 Secondary hyperparathyroidism of renal origin: Secondary | ICD-10-CM | POA: Diagnosis not present

## 2023-04-03 DIAGNOSIS — D631 Anemia in chronic kidney disease: Secondary | ICD-10-CM | POA: Diagnosis not present

## 2023-04-03 DIAGNOSIS — M79674 Pain in right toe(s): Secondary | ICD-10-CM | POA: Diagnosis not present

## 2023-04-03 DIAGNOSIS — E1122 Type 2 diabetes mellitus with diabetic chronic kidney disease: Secondary | ICD-10-CM | POA: Diagnosis not present

## 2023-04-03 DIAGNOSIS — Z992 Dependence on renal dialysis: Secondary | ICD-10-CM | POA: Diagnosis not present

## 2023-04-03 DIAGNOSIS — N186 End stage renal disease: Secondary | ICD-10-CM | POA: Diagnosis not present

## 2023-04-03 NOTE — Progress Notes (Signed)
  Subjective:  Patient ID: Levi Hodges, male    DOB: Sep 22, 1976,  MRN: [id number]  Chief Complaint  Patient presents with   Nail Problem    Nail tirm   47 y.o. male returns for the above complaint.  Patient presents with thickened elongated dystrophic toenails x10 mild pain on palpation.  He would like to have them debrided down he is a diabetic with controlled A1c.  He has not seen anyone else prior to seeing me for this.  Denies any other acute complaints  Objective:  There were no vitals filed for this visit. Podiatric Exam: Vascular: dorsalis pedis and posterior tibial pulses are palpable bilateral. Capillary return is immediate. Temperature gradient is WNL. Skin turgor WNL  Sensorium: Normal Semmes Weinstein monofilament test. Normal tactile sensation bilaterally. Nail Exam: Pt has thick disfigured discolored nails with subungual debris noted bilateral entire nail hallux through fifth toenails.  Pain on palpation to the nails. Ulcer Exam: There is no evidence of ulcer or pre-ulcerative changes or infection. Orthopedic Exam: Muscle tone and strength are WNL. No limitations in general ROM. No crepitus or effusions noted.  Skin: No Porokeratosis. No infection or ulcers    Assessment & Plan:   No diagnosis found.   Patient was evaluated and treated and all questions answered.  Onychomycosis with pain  -Nails palliatively debrided as below. -Educated on self-care  Procedure: Nail Debridement Rationale: pain  Type of Debridement: manual, sharp debridement. Instrumentation: Nail nipper, rotary burr. Number of Nails: 10  Procedures and Treatment: Consent by patient was obtained for treatment procedures. The patient understood the discussion of treatment and procedures well. All questions were answered thoroughly reviewed. Debridement of mycotic and hypertrophic toenails, 1 through 5 bilateral and clearing of subungual debris. No ulceration, no infection noted.  Return  Visit-Office Procedure: Patient instructed to return to the office for a follow up visit 3 months for continued evaluation and treatment.  Levi Hodges, DPM    No follow-ups on file.

## 2023-04-06 DIAGNOSIS — N186 End stage renal disease: Secondary | ICD-10-CM | POA: Diagnosis not present

## 2023-04-06 DIAGNOSIS — N2581 Secondary hyperparathyroidism of renal origin: Secondary | ICD-10-CM | POA: Diagnosis not present

## 2023-04-06 DIAGNOSIS — Z992 Dependence on renal dialysis: Secondary | ICD-10-CM | POA: Diagnosis not present

## 2023-04-06 DIAGNOSIS — E1122 Type 2 diabetes mellitus with diabetic chronic kidney disease: Secondary | ICD-10-CM | POA: Diagnosis not present

## 2023-04-06 DIAGNOSIS — D689 Coagulation defect, unspecified: Secondary | ICD-10-CM | POA: Diagnosis not present

## 2023-04-06 DIAGNOSIS — D631 Anemia in chronic kidney disease: Secondary | ICD-10-CM | POA: Diagnosis not present

## 2023-04-06 DIAGNOSIS — D509 Iron deficiency anemia, unspecified: Secondary | ICD-10-CM | POA: Diagnosis not present

## 2023-04-06 DIAGNOSIS — R110 Nausea: Secondary | ICD-10-CM | POA: Diagnosis not present

## 2023-04-08 DIAGNOSIS — N2581 Secondary hyperparathyroidism of renal origin: Secondary | ICD-10-CM | POA: Diagnosis not present

## 2023-04-08 DIAGNOSIS — D509 Iron deficiency anemia, unspecified: Secondary | ICD-10-CM | POA: Diagnosis not present

## 2023-04-08 DIAGNOSIS — D631 Anemia in chronic kidney disease: Secondary | ICD-10-CM | POA: Diagnosis not present

## 2023-04-08 DIAGNOSIS — Z992 Dependence on renal dialysis: Secondary | ICD-10-CM | POA: Diagnosis not present

## 2023-04-08 DIAGNOSIS — R110 Nausea: Secondary | ICD-10-CM | POA: Diagnosis not present

## 2023-04-08 DIAGNOSIS — E1122 Type 2 diabetes mellitus with diabetic chronic kidney disease: Secondary | ICD-10-CM | POA: Diagnosis not present

## 2023-04-08 DIAGNOSIS — D689 Coagulation defect, unspecified: Secondary | ICD-10-CM | POA: Diagnosis not present

## 2023-04-08 DIAGNOSIS — N186 End stage renal disease: Secondary | ICD-10-CM | POA: Diagnosis not present

## 2023-04-09 DIAGNOSIS — R310 Gross hematuria: Secondary | ICD-10-CM | POA: Diagnosis not present

## 2023-04-10 DIAGNOSIS — E1122 Type 2 diabetes mellitus with diabetic chronic kidney disease: Secondary | ICD-10-CM | POA: Diagnosis not present

## 2023-04-10 DIAGNOSIS — Z992 Dependence on renal dialysis: Secondary | ICD-10-CM | POA: Diagnosis not present

## 2023-04-10 DIAGNOSIS — R110 Nausea: Secondary | ICD-10-CM | POA: Diagnosis not present

## 2023-04-10 DIAGNOSIS — D631 Anemia in chronic kidney disease: Secondary | ICD-10-CM | POA: Diagnosis not present

## 2023-04-10 DIAGNOSIS — D509 Iron deficiency anemia, unspecified: Secondary | ICD-10-CM | POA: Diagnosis not present

## 2023-04-10 DIAGNOSIS — N186 End stage renal disease: Secondary | ICD-10-CM | POA: Diagnosis not present

## 2023-04-10 DIAGNOSIS — N2581 Secondary hyperparathyroidism of renal origin: Secondary | ICD-10-CM | POA: Diagnosis not present

## 2023-04-10 DIAGNOSIS — D689 Coagulation defect, unspecified: Secondary | ICD-10-CM | POA: Diagnosis not present

## 2023-04-13 DIAGNOSIS — D689 Coagulation defect, unspecified: Secondary | ICD-10-CM | POA: Diagnosis not present

## 2023-04-13 DIAGNOSIS — D509 Iron deficiency anemia, unspecified: Secondary | ICD-10-CM | POA: Diagnosis not present

## 2023-04-13 DIAGNOSIS — N2581 Secondary hyperparathyroidism of renal origin: Secondary | ICD-10-CM | POA: Diagnosis not present

## 2023-04-13 DIAGNOSIS — R110 Nausea: Secondary | ICD-10-CM | POA: Diagnosis not present

## 2023-04-13 DIAGNOSIS — Z992 Dependence on renal dialysis: Secondary | ICD-10-CM | POA: Diagnosis not present

## 2023-04-13 DIAGNOSIS — N186 End stage renal disease: Secondary | ICD-10-CM | POA: Diagnosis not present

## 2023-04-13 DIAGNOSIS — E1122 Type 2 diabetes mellitus with diabetic chronic kidney disease: Secondary | ICD-10-CM | POA: Diagnosis not present

## 2023-04-13 DIAGNOSIS — D631 Anemia in chronic kidney disease: Secondary | ICD-10-CM | POA: Diagnosis not present

## 2023-04-15 DIAGNOSIS — Z992 Dependence on renal dialysis: Secondary | ICD-10-CM | POA: Diagnosis not present

## 2023-04-15 DIAGNOSIS — D689 Coagulation defect, unspecified: Secondary | ICD-10-CM | POA: Diagnosis not present

## 2023-04-15 DIAGNOSIS — E1122 Type 2 diabetes mellitus with diabetic chronic kidney disease: Secondary | ICD-10-CM | POA: Diagnosis not present

## 2023-04-15 DIAGNOSIS — N186 End stage renal disease: Secondary | ICD-10-CM | POA: Diagnosis not present

## 2023-04-15 DIAGNOSIS — N2581 Secondary hyperparathyroidism of renal origin: Secondary | ICD-10-CM | POA: Diagnosis not present

## 2023-04-15 DIAGNOSIS — D631 Anemia in chronic kidney disease: Secondary | ICD-10-CM | POA: Diagnosis not present

## 2023-04-15 DIAGNOSIS — R110 Nausea: Secondary | ICD-10-CM | POA: Diagnosis not present

## 2023-04-15 DIAGNOSIS — D509 Iron deficiency anemia, unspecified: Secondary | ICD-10-CM | POA: Diagnosis not present

## 2023-04-16 ENCOUNTER — Ambulatory Visit: Payer: Medicare Other | Admitting: Neurology

## 2023-04-17 DIAGNOSIS — D631 Anemia in chronic kidney disease: Secondary | ICD-10-CM | POA: Diagnosis not present

## 2023-04-17 DIAGNOSIS — N2581 Secondary hyperparathyroidism of renal origin: Secondary | ICD-10-CM | POA: Diagnosis not present

## 2023-04-17 DIAGNOSIS — N186 End stage renal disease: Secondary | ICD-10-CM | POA: Diagnosis not present

## 2023-04-17 DIAGNOSIS — D689 Coagulation defect, unspecified: Secondary | ICD-10-CM | POA: Diagnosis not present

## 2023-04-17 DIAGNOSIS — D509 Iron deficiency anemia, unspecified: Secondary | ICD-10-CM | POA: Diagnosis not present

## 2023-04-17 DIAGNOSIS — Z992 Dependence on renal dialysis: Secondary | ICD-10-CM | POA: Diagnosis not present

## 2023-04-17 DIAGNOSIS — E1122 Type 2 diabetes mellitus with diabetic chronic kidney disease: Secondary | ICD-10-CM | POA: Diagnosis not present

## 2023-04-17 DIAGNOSIS — R110 Nausea: Secondary | ICD-10-CM | POA: Diagnosis not present

## 2023-04-20 DIAGNOSIS — Z992 Dependence on renal dialysis: Secondary | ICD-10-CM | POA: Diagnosis not present

## 2023-04-20 DIAGNOSIS — R110 Nausea: Secondary | ICD-10-CM | POA: Diagnosis not present

## 2023-04-20 DIAGNOSIS — E1122 Type 2 diabetes mellitus with diabetic chronic kidney disease: Secondary | ICD-10-CM | POA: Diagnosis not present

## 2023-04-20 DIAGNOSIS — D689 Coagulation defect, unspecified: Secondary | ICD-10-CM | POA: Diagnosis not present

## 2023-04-20 DIAGNOSIS — N2581 Secondary hyperparathyroidism of renal origin: Secondary | ICD-10-CM | POA: Diagnosis not present

## 2023-04-20 DIAGNOSIS — N186 End stage renal disease: Secondary | ICD-10-CM | POA: Diagnosis not present

## 2023-04-20 DIAGNOSIS — D631 Anemia in chronic kidney disease: Secondary | ICD-10-CM | POA: Diagnosis not present

## 2023-04-20 DIAGNOSIS — D509 Iron deficiency anemia, unspecified: Secondary | ICD-10-CM | POA: Diagnosis not present

## 2023-04-22 DIAGNOSIS — R110 Nausea: Secondary | ICD-10-CM | POA: Diagnosis not present

## 2023-04-22 DIAGNOSIS — D689 Coagulation defect, unspecified: Secondary | ICD-10-CM | POA: Diagnosis not present

## 2023-04-22 DIAGNOSIS — D509 Iron deficiency anemia, unspecified: Secondary | ICD-10-CM | POA: Diagnosis not present

## 2023-04-22 DIAGNOSIS — D631 Anemia in chronic kidney disease: Secondary | ICD-10-CM | POA: Diagnosis not present

## 2023-04-22 DIAGNOSIS — N2581 Secondary hyperparathyroidism of renal origin: Secondary | ICD-10-CM | POA: Diagnosis not present

## 2023-04-22 DIAGNOSIS — N186 End stage renal disease: Secondary | ICD-10-CM | POA: Diagnosis not present

## 2023-04-22 DIAGNOSIS — Z992 Dependence on renal dialysis: Secondary | ICD-10-CM | POA: Diagnosis not present

## 2023-04-22 DIAGNOSIS — E1122 Type 2 diabetes mellitus with diabetic chronic kidney disease: Secondary | ICD-10-CM | POA: Diagnosis not present

## 2023-04-24 DIAGNOSIS — N2581 Secondary hyperparathyroidism of renal origin: Secondary | ICD-10-CM | POA: Diagnosis not present

## 2023-04-24 DIAGNOSIS — D689 Coagulation defect, unspecified: Secondary | ICD-10-CM | POA: Diagnosis not present

## 2023-04-24 DIAGNOSIS — E1122 Type 2 diabetes mellitus with diabetic chronic kidney disease: Secondary | ICD-10-CM | POA: Diagnosis not present

## 2023-04-24 DIAGNOSIS — N186 End stage renal disease: Secondary | ICD-10-CM | POA: Diagnosis not present

## 2023-04-24 DIAGNOSIS — Z992 Dependence on renal dialysis: Secondary | ICD-10-CM | POA: Diagnosis not present

## 2023-04-24 DIAGNOSIS — D631 Anemia in chronic kidney disease: Secondary | ICD-10-CM | POA: Diagnosis not present

## 2023-04-24 DIAGNOSIS — D509 Iron deficiency anemia, unspecified: Secondary | ICD-10-CM | POA: Diagnosis not present

## 2023-04-24 DIAGNOSIS — R110 Nausea: Secondary | ICD-10-CM | POA: Diagnosis not present

## 2023-04-27 DIAGNOSIS — D689 Coagulation defect, unspecified: Secondary | ICD-10-CM | POA: Diagnosis not present

## 2023-04-27 DIAGNOSIS — D509 Iron deficiency anemia, unspecified: Secondary | ICD-10-CM | POA: Diagnosis not present

## 2023-04-27 DIAGNOSIS — N2581 Secondary hyperparathyroidism of renal origin: Secondary | ICD-10-CM | POA: Diagnosis not present

## 2023-04-27 DIAGNOSIS — D631 Anemia in chronic kidney disease: Secondary | ICD-10-CM | POA: Diagnosis not present

## 2023-04-27 DIAGNOSIS — N186 End stage renal disease: Secondary | ICD-10-CM | POA: Diagnosis not present

## 2023-04-27 DIAGNOSIS — R110 Nausea: Secondary | ICD-10-CM | POA: Diagnosis not present

## 2023-04-27 DIAGNOSIS — E1122 Type 2 diabetes mellitus with diabetic chronic kidney disease: Secondary | ICD-10-CM | POA: Diagnosis not present

## 2023-04-27 DIAGNOSIS — Z992 Dependence on renal dialysis: Secondary | ICD-10-CM | POA: Diagnosis not present

## 2023-04-29 DIAGNOSIS — D509 Iron deficiency anemia, unspecified: Secondary | ICD-10-CM | POA: Diagnosis not present

## 2023-04-29 DIAGNOSIS — Z992 Dependence on renal dialysis: Secondary | ICD-10-CM | POA: Diagnosis not present

## 2023-04-29 DIAGNOSIS — D631 Anemia in chronic kidney disease: Secondary | ICD-10-CM | POA: Diagnosis not present

## 2023-04-29 DIAGNOSIS — E1122 Type 2 diabetes mellitus with diabetic chronic kidney disease: Secondary | ICD-10-CM | POA: Diagnosis not present

## 2023-04-29 DIAGNOSIS — D689 Coagulation defect, unspecified: Secondary | ICD-10-CM | POA: Diagnosis not present

## 2023-04-29 DIAGNOSIS — R110 Nausea: Secondary | ICD-10-CM | POA: Diagnosis not present

## 2023-04-29 DIAGNOSIS — N186 End stage renal disease: Secondary | ICD-10-CM | POA: Diagnosis not present

## 2023-04-29 DIAGNOSIS — N2581 Secondary hyperparathyroidism of renal origin: Secondary | ICD-10-CM | POA: Diagnosis not present

## 2023-05-01 DIAGNOSIS — D689 Coagulation defect, unspecified: Secondary | ICD-10-CM | POA: Diagnosis not present

## 2023-05-01 DIAGNOSIS — E1122 Type 2 diabetes mellitus with diabetic chronic kidney disease: Secondary | ICD-10-CM | POA: Diagnosis not present

## 2023-05-01 DIAGNOSIS — D509 Iron deficiency anemia, unspecified: Secondary | ICD-10-CM | POA: Diagnosis not present

## 2023-05-01 DIAGNOSIS — Z992 Dependence on renal dialysis: Secondary | ICD-10-CM | POA: Diagnosis not present

## 2023-05-01 DIAGNOSIS — N2581 Secondary hyperparathyroidism of renal origin: Secondary | ICD-10-CM | POA: Diagnosis not present

## 2023-05-01 DIAGNOSIS — D631 Anemia in chronic kidney disease: Secondary | ICD-10-CM | POA: Diagnosis not present

## 2023-05-01 DIAGNOSIS — R110 Nausea: Secondary | ICD-10-CM | POA: Diagnosis not present

## 2023-05-01 DIAGNOSIS — N186 End stage renal disease: Secondary | ICD-10-CM | POA: Diagnosis not present

## 2023-05-03 DIAGNOSIS — Z992 Dependence on renal dialysis: Secondary | ICD-10-CM | POA: Diagnosis not present

## 2023-05-03 DIAGNOSIS — E1122 Type 2 diabetes mellitus with diabetic chronic kidney disease: Secondary | ICD-10-CM | POA: Diagnosis not present

## 2023-05-03 DIAGNOSIS — N186 End stage renal disease: Secondary | ICD-10-CM | POA: Diagnosis not present

## 2023-05-04 DIAGNOSIS — D689 Coagulation defect, unspecified: Secondary | ICD-10-CM | POA: Diagnosis not present

## 2023-05-04 DIAGNOSIS — E1122 Type 2 diabetes mellitus with diabetic chronic kidney disease: Secondary | ICD-10-CM | POA: Diagnosis not present

## 2023-05-04 DIAGNOSIS — D631 Anemia in chronic kidney disease: Secondary | ICD-10-CM | POA: Diagnosis not present

## 2023-05-04 DIAGNOSIS — D509 Iron deficiency anemia, unspecified: Secondary | ICD-10-CM | POA: Diagnosis not present

## 2023-05-04 DIAGNOSIS — N2581 Secondary hyperparathyroidism of renal origin: Secondary | ICD-10-CM | POA: Diagnosis not present

## 2023-05-04 DIAGNOSIS — Z992 Dependence on renal dialysis: Secondary | ICD-10-CM | POA: Diagnosis not present

## 2023-05-04 DIAGNOSIS — N186 End stage renal disease: Secondary | ICD-10-CM | POA: Diagnosis not present

## 2023-05-05 DIAGNOSIS — R310 Gross hematuria: Secondary | ICD-10-CM | POA: Diagnosis not present

## 2023-05-06 DIAGNOSIS — D631 Anemia in chronic kidney disease: Secondary | ICD-10-CM | POA: Diagnosis not present

## 2023-05-06 DIAGNOSIS — D689 Coagulation defect, unspecified: Secondary | ICD-10-CM | POA: Diagnosis not present

## 2023-05-06 DIAGNOSIS — N186 End stage renal disease: Secondary | ICD-10-CM | POA: Diagnosis not present

## 2023-05-06 DIAGNOSIS — D509 Iron deficiency anemia, unspecified: Secondary | ICD-10-CM | POA: Diagnosis not present

## 2023-05-06 DIAGNOSIS — Z992 Dependence on renal dialysis: Secondary | ICD-10-CM | POA: Diagnosis not present

## 2023-05-06 DIAGNOSIS — E1122 Type 2 diabetes mellitus with diabetic chronic kidney disease: Secondary | ICD-10-CM | POA: Diagnosis not present

## 2023-05-06 DIAGNOSIS — N2581 Secondary hyperparathyroidism of renal origin: Secondary | ICD-10-CM | POA: Diagnosis not present

## 2023-05-08 DIAGNOSIS — N186 End stage renal disease: Secondary | ICD-10-CM | POA: Diagnosis not present

## 2023-05-08 DIAGNOSIS — D689 Coagulation defect, unspecified: Secondary | ICD-10-CM | POA: Diagnosis not present

## 2023-05-08 DIAGNOSIS — N2581 Secondary hyperparathyroidism of renal origin: Secondary | ICD-10-CM | POA: Diagnosis not present

## 2023-05-08 DIAGNOSIS — E1122 Type 2 diabetes mellitus with diabetic chronic kidney disease: Secondary | ICD-10-CM | POA: Diagnosis not present

## 2023-05-08 DIAGNOSIS — Z992 Dependence on renal dialysis: Secondary | ICD-10-CM | POA: Diagnosis not present

## 2023-05-08 DIAGNOSIS — D631 Anemia in chronic kidney disease: Secondary | ICD-10-CM | POA: Diagnosis not present

## 2023-05-08 DIAGNOSIS — D509 Iron deficiency anemia, unspecified: Secondary | ICD-10-CM | POA: Diagnosis not present

## 2023-05-11 DIAGNOSIS — N2581 Secondary hyperparathyroidism of renal origin: Secondary | ICD-10-CM | POA: Diagnosis not present

## 2023-05-11 DIAGNOSIS — D631 Anemia in chronic kidney disease: Secondary | ICD-10-CM | POA: Diagnosis not present

## 2023-05-11 DIAGNOSIS — E1122 Type 2 diabetes mellitus with diabetic chronic kidney disease: Secondary | ICD-10-CM | POA: Diagnosis not present

## 2023-05-11 DIAGNOSIS — D509 Iron deficiency anemia, unspecified: Secondary | ICD-10-CM | POA: Diagnosis not present

## 2023-05-11 DIAGNOSIS — Z992 Dependence on renal dialysis: Secondary | ICD-10-CM | POA: Diagnosis not present

## 2023-05-11 DIAGNOSIS — D689 Coagulation defect, unspecified: Secondary | ICD-10-CM | POA: Diagnosis not present

## 2023-05-11 DIAGNOSIS — N186 End stage renal disease: Secondary | ICD-10-CM | POA: Diagnosis not present

## 2023-05-13 DIAGNOSIS — E1122 Type 2 diabetes mellitus with diabetic chronic kidney disease: Secondary | ICD-10-CM | POA: Diagnosis not present

## 2023-05-13 DIAGNOSIS — D509 Iron deficiency anemia, unspecified: Secondary | ICD-10-CM | POA: Diagnosis not present

## 2023-05-13 DIAGNOSIS — D689 Coagulation defect, unspecified: Secondary | ICD-10-CM | POA: Diagnosis not present

## 2023-05-13 DIAGNOSIS — N186 End stage renal disease: Secondary | ICD-10-CM | POA: Diagnosis not present

## 2023-05-13 DIAGNOSIS — Z992 Dependence on renal dialysis: Secondary | ICD-10-CM | POA: Diagnosis not present

## 2023-05-13 DIAGNOSIS — D631 Anemia in chronic kidney disease: Secondary | ICD-10-CM | POA: Diagnosis not present

## 2023-05-13 DIAGNOSIS — N2581 Secondary hyperparathyroidism of renal origin: Secondary | ICD-10-CM | POA: Diagnosis not present

## 2023-05-15 DIAGNOSIS — N2581 Secondary hyperparathyroidism of renal origin: Secondary | ICD-10-CM | POA: Diagnosis not present

## 2023-05-15 DIAGNOSIS — N186 End stage renal disease: Secondary | ICD-10-CM | POA: Diagnosis not present

## 2023-05-15 DIAGNOSIS — D689 Coagulation defect, unspecified: Secondary | ICD-10-CM | POA: Diagnosis not present

## 2023-05-15 DIAGNOSIS — Z992 Dependence on renal dialysis: Secondary | ICD-10-CM | POA: Diagnosis not present

## 2023-05-15 DIAGNOSIS — D631 Anemia in chronic kidney disease: Secondary | ICD-10-CM | POA: Diagnosis not present

## 2023-05-15 DIAGNOSIS — E1122 Type 2 diabetes mellitus with diabetic chronic kidney disease: Secondary | ICD-10-CM | POA: Diagnosis not present

## 2023-05-15 DIAGNOSIS — D509 Iron deficiency anemia, unspecified: Secondary | ICD-10-CM | POA: Diagnosis not present

## 2023-05-18 DIAGNOSIS — Z992 Dependence on renal dialysis: Secondary | ICD-10-CM | POA: Diagnosis not present

## 2023-05-18 DIAGNOSIS — N186 End stage renal disease: Secondary | ICD-10-CM | POA: Diagnosis not present

## 2023-05-18 DIAGNOSIS — D689 Coagulation defect, unspecified: Secondary | ICD-10-CM | POA: Diagnosis not present

## 2023-05-18 DIAGNOSIS — D631 Anemia in chronic kidney disease: Secondary | ICD-10-CM | POA: Diagnosis not present

## 2023-05-18 DIAGNOSIS — E1122 Type 2 diabetes mellitus with diabetic chronic kidney disease: Secondary | ICD-10-CM | POA: Diagnosis not present

## 2023-05-18 DIAGNOSIS — D509 Iron deficiency anemia, unspecified: Secondary | ICD-10-CM | POA: Diagnosis not present

## 2023-05-18 DIAGNOSIS — N2581 Secondary hyperparathyroidism of renal origin: Secondary | ICD-10-CM | POA: Diagnosis not present

## 2023-05-20 DIAGNOSIS — D509 Iron deficiency anemia, unspecified: Secondary | ICD-10-CM | POA: Diagnosis not present

## 2023-05-20 DIAGNOSIS — Z992 Dependence on renal dialysis: Secondary | ICD-10-CM | POA: Diagnosis not present

## 2023-05-20 DIAGNOSIS — D631 Anemia in chronic kidney disease: Secondary | ICD-10-CM | POA: Diagnosis not present

## 2023-05-20 DIAGNOSIS — N186 End stage renal disease: Secondary | ICD-10-CM | POA: Diagnosis not present

## 2023-05-20 DIAGNOSIS — N2581 Secondary hyperparathyroidism of renal origin: Secondary | ICD-10-CM | POA: Diagnosis not present

## 2023-05-20 DIAGNOSIS — D689 Coagulation defect, unspecified: Secondary | ICD-10-CM | POA: Diagnosis not present

## 2023-05-20 DIAGNOSIS — E1122 Type 2 diabetes mellitus with diabetic chronic kidney disease: Secondary | ICD-10-CM | POA: Diagnosis not present

## 2023-05-22 DIAGNOSIS — D689 Coagulation defect, unspecified: Secondary | ICD-10-CM | POA: Diagnosis not present

## 2023-05-22 DIAGNOSIS — N2581 Secondary hyperparathyroidism of renal origin: Secondary | ICD-10-CM | POA: Diagnosis not present

## 2023-05-22 DIAGNOSIS — E1122 Type 2 diabetes mellitus with diabetic chronic kidney disease: Secondary | ICD-10-CM | POA: Diagnosis not present

## 2023-05-22 DIAGNOSIS — D509 Iron deficiency anemia, unspecified: Secondary | ICD-10-CM | POA: Diagnosis not present

## 2023-05-22 DIAGNOSIS — N186 End stage renal disease: Secondary | ICD-10-CM | POA: Diagnosis not present

## 2023-05-22 DIAGNOSIS — D631 Anemia in chronic kidney disease: Secondary | ICD-10-CM | POA: Diagnosis not present

## 2023-05-22 DIAGNOSIS — Z992 Dependence on renal dialysis: Secondary | ICD-10-CM | POA: Diagnosis not present

## 2023-05-25 DIAGNOSIS — E1122 Type 2 diabetes mellitus with diabetic chronic kidney disease: Secondary | ICD-10-CM | POA: Diagnosis not present

## 2023-05-25 DIAGNOSIS — Z992 Dependence on renal dialysis: Secondary | ICD-10-CM | POA: Diagnosis not present

## 2023-05-25 DIAGNOSIS — D631 Anemia in chronic kidney disease: Secondary | ICD-10-CM | POA: Diagnosis not present

## 2023-05-25 DIAGNOSIS — N2581 Secondary hyperparathyroidism of renal origin: Secondary | ICD-10-CM | POA: Diagnosis not present

## 2023-05-25 DIAGNOSIS — D689 Coagulation defect, unspecified: Secondary | ICD-10-CM | POA: Diagnosis not present

## 2023-05-25 DIAGNOSIS — D509 Iron deficiency anemia, unspecified: Secondary | ICD-10-CM | POA: Diagnosis not present

## 2023-05-25 DIAGNOSIS — N186 End stage renal disease: Secondary | ICD-10-CM | POA: Diagnosis not present

## 2023-05-27 DIAGNOSIS — E1122 Type 2 diabetes mellitus with diabetic chronic kidney disease: Secondary | ICD-10-CM | POA: Diagnosis not present

## 2023-05-27 DIAGNOSIS — N186 End stage renal disease: Secondary | ICD-10-CM | POA: Diagnosis not present

## 2023-05-27 DIAGNOSIS — D631 Anemia in chronic kidney disease: Secondary | ICD-10-CM | POA: Diagnosis not present

## 2023-05-27 DIAGNOSIS — D689 Coagulation defect, unspecified: Secondary | ICD-10-CM | POA: Diagnosis not present

## 2023-05-27 DIAGNOSIS — N2581 Secondary hyperparathyroidism of renal origin: Secondary | ICD-10-CM | POA: Diagnosis not present

## 2023-05-27 DIAGNOSIS — Z992 Dependence on renal dialysis: Secondary | ICD-10-CM | POA: Diagnosis not present

## 2023-05-27 DIAGNOSIS — D509 Iron deficiency anemia, unspecified: Secondary | ICD-10-CM | POA: Diagnosis not present

## 2023-05-29 DIAGNOSIS — Z992 Dependence on renal dialysis: Secondary | ICD-10-CM | POA: Diagnosis not present

## 2023-05-29 DIAGNOSIS — D689 Coagulation defect, unspecified: Secondary | ICD-10-CM | POA: Diagnosis not present

## 2023-05-29 DIAGNOSIS — D509 Iron deficiency anemia, unspecified: Secondary | ICD-10-CM | POA: Diagnosis not present

## 2023-05-29 DIAGNOSIS — D631 Anemia in chronic kidney disease: Secondary | ICD-10-CM | POA: Diagnosis not present

## 2023-05-29 DIAGNOSIS — N186 End stage renal disease: Secondary | ICD-10-CM | POA: Diagnosis not present

## 2023-05-29 DIAGNOSIS — N2581 Secondary hyperparathyroidism of renal origin: Secondary | ICD-10-CM | POA: Diagnosis not present

## 2023-05-29 DIAGNOSIS — E1122 Type 2 diabetes mellitus with diabetic chronic kidney disease: Secondary | ICD-10-CM | POA: Diagnosis not present

## 2023-06-01 DIAGNOSIS — E1122 Type 2 diabetes mellitus with diabetic chronic kidney disease: Secondary | ICD-10-CM | POA: Diagnosis not present

## 2023-06-01 DIAGNOSIS — D631 Anemia in chronic kidney disease: Secondary | ICD-10-CM | POA: Diagnosis not present

## 2023-06-01 DIAGNOSIS — N186 End stage renal disease: Secondary | ICD-10-CM | POA: Diagnosis not present

## 2023-06-01 DIAGNOSIS — D689 Coagulation defect, unspecified: Secondary | ICD-10-CM | POA: Diagnosis not present

## 2023-06-01 DIAGNOSIS — D509 Iron deficiency anemia, unspecified: Secondary | ICD-10-CM | POA: Diagnosis not present

## 2023-06-01 DIAGNOSIS — N2581 Secondary hyperparathyroidism of renal origin: Secondary | ICD-10-CM | POA: Diagnosis not present

## 2023-06-01 DIAGNOSIS — Z992 Dependence on renal dialysis: Secondary | ICD-10-CM | POA: Diagnosis not present

## 2023-06-03 DIAGNOSIS — N186 End stage renal disease: Secondary | ICD-10-CM | POA: Diagnosis not present

## 2023-06-03 DIAGNOSIS — E1122 Type 2 diabetes mellitus with diabetic chronic kidney disease: Secondary | ICD-10-CM | POA: Diagnosis not present

## 2023-06-03 DIAGNOSIS — Z992 Dependence on renal dialysis: Secondary | ICD-10-CM | POA: Diagnosis not present

## 2023-06-05 DIAGNOSIS — N186 End stage renal disease: Secondary | ICD-10-CM | POA: Diagnosis not present

## 2023-06-05 DIAGNOSIS — D689 Coagulation defect, unspecified: Secondary | ICD-10-CM | POA: Diagnosis not present

## 2023-06-05 DIAGNOSIS — E1122 Type 2 diabetes mellitus with diabetic chronic kidney disease: Secondary | ICD-10-CM | POA: Diagnosis not present

## 2023-06-05 DIAGNOSIS — N2581 Secondary hyperparathyroidism of renal origin: Secondary | ICD-10-CM | POA: Diagnosis not present

## 2023-06-05 DIAGNOSIS — D631 Anemia in chronic kidney disease: Secondary | ICD-10-CM | POA: Diagnosis not present

## 2023-06-05 DIAGNOSIS — D509 Iron deficiency anemia, unspecified: Secondary | ICD-10-CM | POA: Diagnosis not present

## 2023-06-05 DIAGNOSIS — Z992 Dependence on renal dialysis: Secondary | ICD-10-CM | POA: Diagnosis not present

## 2023-06-08 DIAGNOSIS — N2581 Secondary hyperparathyroidism of renal origin: Secondary | ICD-10-CM | POA: Diagnosis not present

## 2023-06-08 DIAGNOSIS — D509 Iron deficiency anemia, unspecified: Secondary | ICD-10-CM | POA: Diagnosis not present

## 2023-06-08 DIAGNOSIS — Z992 Dependence on renal dialysis: Secondary | ICD-10-CM | POA: Diagnosis not present

## 2023-06-08 DIAGNOSIS — N186 End stage renal disease: Secondary | ICD-10-CM | POA: Diagnosis not present

## 2023-06-08 DIAGNOSIS — E1122 Type 2 diabetes mellitus with diabetic chronic kidney disease: Secondary | ICD-10-CM | POA: Diagnosis not present

## 2023-06-08 DIAGNOSIS — D631 Anemia in chronic kidney disease: Secondary | ICD-10-CM | POA: Diagnosis not present

## 2023-06-08 DIAGNOSIS — D689 Coagulation defect, unspecified: Secondary | ICD-10-CM | POA: Diagnosis not present

## 2023-06-10 DIAGNOSIS — N2581 Secondary hyperparathyroidism of renal origin: Secondary | ICD-10-CM | POA: Diagnosis not present

## 2023-06-10 DIAGNOSIS — D689 Coagulation defect, unspecified: Secondary | ICD-10-CM | POA: Diagnosis not present

## 2023-06-10 DIAGNOSIS — E1122 Type 2 diabetes mellitus with diabetic chronic kidney disease: Secondary | ICD-10-CM | POA: Diagnosis not present

## 2023-06-10 DIAGNOSIS — D509 Iron deficiency anemia, unspecified: Secondary | ICD-10-CM | POA: Diagnosis not present

## 2023-06-10 DIAGNOSIS — Z992 Dependence on renal dialysis: Secondary | ICD-10-CM | POA: Diagnosis not present

## 2023-06-10 DIAGNOSIS — D631 Anemia in chronic kidney disease: Secondary | ICD-10-CM | POA: Diagnosis not present

## 2023-06-10 DIAGNOSIS — N186 End stage renal disease: Secondary | ICD-10-CM | POA: Diagnosis not present

## 2023-06-12 DIAGNOSIS — D509 Iron deficiency anemia, unspecified: Secondary | ICD-10-CM | POA: Diagnosis not present

## 2023-06-12 DIAGNOSIS — E1122 Type 2 diabetes mellitus with diabetic chronic kidney disease: Secondary | ICD-10-CM | POA: Diagnosis not present

## 2023-06-12 DIAGNOSIS — D631 Anemia in chronic kidney disease: Secondary | ICD-10-CM | POA: Diagnosis not present

## 2023-06-12 DIAGNOSIS — N2581 Secondary hyperparathyroidism of renal origin: Secondary | ICD-10-CM | POA: Diagnosis not present

## 2023-06-12 DIAGNOSIS — D689 Coagulation defect, unspecified: Secondary | ICD-10-CM | POA: Diagnosis not present

## 2023-06-12 DIAGNOSIS — N186 End stage renal disease: Secondary | ICD-10-CM | POA: Diagnosis not present

## 2023-06-12 DIAGNOSIS — Z992 Dependence on renal dialysis: Secondary | ICD-10-CM | POA: Diagnosis not present

## 2023-06-15 DIAGNOSIS — D509 Iron deficiency anemia, unspecified: Secondary | ICD-10-CM | POA: Diagnosis not present

## 2023-06-15 DIAGNOSIS — D631 Anemia in chronic kidney disease: Secondary | ICD-10-CM | POA: Diagnosis not present

## 2023-06-15 DIAGNOSIS — N186 End stage renal disease: Secondary | ICD-10-CM | POA: Diagnosis not present

## 2023-06-15 DIAGNOSIS — Z992 Dependence on renal dialysis: Secondary | ICD-10-CM | POA: Diagnosis not present

## 2023-06-15 DIAGNOSIS — N2581 Secondary hyperparathyroidism of renal origin: Secondary | ICD-10-CM | POA: Diagnosis not present

## 2023-06-15 DIAGNOSIS — E1122 Type 2 diabetes mellitus with diabetic chronic kidney disease: Secondary | ICD-10-CM | POA: Diagnosis not present

## 2023-06-15 DIAGNOSIS — D689 Coagulation defect, unspecified: Secondary | ICD-10-CM | POA: Diagnosis not present

## 2023-06-16 DIAGNOSIS — E113311 Type 2 diabetes mellitus with moderate nonproliferative diabetic retinopathy with macular edema, right eye: Secondary | ICD-10-CM | POA: Diagnosis not present

## 2023-06-16 DIAGNOSIS — E083292 Diabetes mellitus due to underlying condition with mild nonproliferative diabetic retinopathy without macular edema, left eye: Secondary | ICD-10-CM | POA: Diagnosis not present

## 2023-06-16 LAB — HM DIABETES EYE EXAM

## 2023-06-17 DIAGNOSIS — N186 End stage renal disease: Secondary | ICD-10-CM | POA: Diagnosis not present

## 2023-06-17 DIAGNOSIS — Z992 Dependence on renal dialysis: Secondary | ICD-10-CM | POA: Diagnosis not present

## 2023-06-17 DIAGNOSIS — N2581 Secondary hyperparathyroidism of renal origin: Secondary | ICD-10-CM | POA: Diagnosis not present

## 2023-06-17 DIAGNOSIS — D689 Coagulation defect, unspecified: Secondary | ICD-10-CM | POA: Diagnosis not present

## 2023-06-17 DIAGNOSIS — D631 Anemia in chronic kidney disease: Secondary | ICD-10-CM | POA: Diagnosis not present

## 2023-06-17 DIAGNOSIS — E1122 Type 2 diabetes mellitus with diabetic chronic kidney disease: Secondary | ICD-10-CM | POA: Diagnosis not present

## 2023-06-17 DIAGNOSIS — D509 Iron deficiency anemia, unspecified: Secondary | ICD-10-CM | POA: Diagnosis not present

## 2023-06-19 DIAGNOSIS — E1122 Type 2 diabetes mellitus with diabetic chronic kidney disease: Secondary | ICD-10-CM | POA: Diagnosis not present

## 2023-06-19 DIAGNOSIS — Z992 Dependence on renal dialysis: Secondary | ICD-10-CM | POA: Diagnosis not present

## 2023-06-19 DIAGNOSIS — N2581 Secondary hyperparathyroidism of renal origin: Secondary | ICD-10-CM | POA: Diagnosis not present

## 2023-06-19 DIAGNOSIS — D509 Iron deficiency anemia, unspecified: Secondary | ICD-10-CM | POA: Diagnosis not present

## 2023-06-19 DIAGNOSIS — N186 End stage renal disease: Secondary | ICD-10-CM | POA: Diagnosis not present

## 2023-06-19 DIAGNOSIS — D631 Anemia in chronic kidney disease: Secondary | ICD-10-CM | POA: Diagnosis not present

## 2023-06-19 DIAGNOSIS — D689 Coagulation defect, unspecified: Secondary | ICD-10-CM | POA: Diagnosis not present

## 2023-06-22 DIAGNOSIS — E1122 Type 2 diabetes mellitus with diabetic chronic kidney disease: Secondary | ICD-10-CM | POA: Diagnosis not present

## 2023-06-22 DIAGNOSIS — D631 Anemia in chronic kidney disease: Secondary | ICD-10-CM | POA: Diagnosis not present

## 2023-06-22 DIAGNOSIS — D689 Coagulation defect, unspecified: Secondary | ICD-10-CM | POA: Diagnosis not present

## 2023-06-22 DIAGNOSIS — N186 End stage renal disease: Secondary | ICD-10-CM | POA: Diagnosis not present

## 2023-06-22 DIAGNOSIS — Z992 Dependence on renal dialysis: Secondary | ICD-10-CM | POA: Diagnosis not present

## 2023-06-22 DIAGNOSIS — N2581 Secondary hyperparathyroidism of renal origin: Secondary | ICD-10-CM | POA: Diagnosis not present

## 2023-06-22 DIAGNOSIS — D509 Iron deficiency anemia, unspecified: Secondary | ICD-10-CM | POA: Diagnosis not present

## 2023-06-24 DIAGNOSIS — D631 Anemia in chronic kidney disease: Secondary | ICD-10-CM | POA: Diagnosis not present

## 2023-06-24 DIAGNOSIS — N2581 Secondary hyperparathyroidism of renal origin: Secondary | ICD-10-CM | POA: Diagnosis not present

## 2023-06-24 DIAGNOSIS — D509 Iron deficiency anemia, unspecified: Secondary | ICD-10-CM | POA: Diagnosis not present

## 2023-06-24 DIAGNOSIS — E1122 Type 2 diabetes mellitus with diabetic chronic kidney disease: Secondary | ICD-10-CM | POA: Diagnosis not present

## 2023-06-24 DIAGNOSIS — D689 Coagulation defect, unspecified: Secondary | ICD-10-CM | POA: Diagnosis not present

## 2023-06-24 DIAGNOSIS — N186 End stage renal disease: Secondary | ICD-10-CM | POA: Diagnosis not present

## 2023-06-24 DIAGNOSIS — Z992 Dependence on renal dialysis: Secondary | ICD-10-CM | POA: Diagnosis not present

## 2023-06-25 ENCOUNTER — Encounter: Payer: Self-pay | Admitting: Podiatry

## 2023-06-26 DIAGNOSIS — D689 Coagulation defect, unspecified: Secondary | ICD-10-CM | POA: Diagnosis not present

## 2023-06-26 DIAGNOSIS — D631 Anemia in chronic kidney disease: Secondary | ICD-10-CM | POA: Diagnosis not present

## 2023-06-26 DIAGNOSIS — N2581 Secondary hyperparathyroidism of renal origin: Secondary | ICD-10-CM | POA: Diagnosis not present

## 2023-06-26 DIAGNOSIS — E1122 Type 2 diabetes mellitus with diabetic chronic kidney disease: Secondary | ICD-10-CM | POA: Diagnosis not present

## 2023-06-26 DIAGNOSIS — N186 End stage renal disease: Secondary | ICD-10-CM | POA: Diagnosis not present

## 2023-06-26 DIAGNOSIS — Z992 Dependence on renal dialysis: Secondary | ICD-10-CM | POA: Diagnosis not present

## 2023-06-26 DIAGNOSIS — D509 Iron deficiency anemia, unspecified: Secondary | ICD-10-CM | POA: Diagnosis not present

## 2023-06-29 DIAGNOSIS — N2581 Secondary hyperparathyroidism of renal origin: Secondary | ICD-10-CM | POA: Diagnosis not present

## 2023-06-29 DIAGNOSIS — D689 Coagulation defect, unspecified: Secondary | ICD-10-CM | POA: Diagnosis not present

## 2023-06-29 DIAGNOSIS — D631 Anemia in chronic kidney disease: Secondary | ICD-10-CM | POA: Diagnosis not present

## 2023-06-29 DIAGNOSIS — Z992 Dependence on renal dialysis: Secondary | ICD-10-CM | POA: Diagnosis not present

## 2023-06-29 DIAGNOSIS — N186 End stage renal disease: Secondary | ICD-10-CM | POA: Diagnosis not present

## 2023-06-29 DIAGNOSIS — E1122 Type 2 diabetes mellitus with diabetic chronic kidney disease: Secondary | ICD-10-CM | POA: Diagnosis not present

## 2023-06-29 DIAGNOSIS — D509 Iron deficiency anemia, unspecified: Secondary | ICD-10-CM | POA: Diagnosis not present

## 2023-07-01 DIAGNOSIS — D689 Coagulation defect, unspecified: Secondary | ICD-10-CM | POA: Diagnosis not present

## 2023-07-01 DIAGNOSIS — N2581 Secondary hyperparathyroidism of renal origin: Secondary | ICD-10-CM | POA: Diagnosis not present

## 2023-07-01 DIAGNOSIS — Z992 Dependence on renal dialysis: Secondary | ICD-10-CM | POA: Diagnosis not present

## 2023-07-01 DIAGNOSIS — N186 End stage renal disease: Secondary | ICD-10-CM | POA: Diagnosis not present

## 2023-07-01 DIAGNOSIS — E1122 Type 2 diabetes mellitus with diabetic chronic kidney disease: Secondary | ICD-10-CM | POA: Diagnosis not present

## 2023-07-01 DIAGNOSIS — D509 Iron deficiency anemia, unspecified: Secondary | ICD-10-CM | POA: Diagnosis not present

## 2023-07-01 DIAGNOSIS — D631 Anemia in chronic kidney disease: Secondary | ICD-10-CM | POA: Diagnosis not present

## 2023-07-03 ENCOUNTER — Ambulatory Visit: Payer: 59 | Admitting: Podiatry

## 2023-07-03 DIAGNOSIS — D631 Anemia in chronic kidney disease: Secondary | ICD-10-CM | POA: Diagnosis not present

## 2023-07-03 DIAGNOSIS — D509 Iron deficiency anemia, unspecified: Secondary | ICD-10-CM | POA: Diagnosis not present

## 2023-07-03 DIAGNOSIS — E1122 Type 2 diabetes mellitus with diabetic chronic kidney disease: Secondary | ICD-10-CM | POA: Diagnosis not present

## 2023-07-03 DIAGNOSIS — Z992 Dependence on renal dialysis: Secondary | ICD-10-CM | POA: Diagnosis not present

## 2023-07-03 DIAGNOSIS — N186 End stage renal disease: Secondary | ICD-10-CM | POA: Diagnosis not present

## 2023-07-03 DIAGNOSIS — D689 Coagulation defect, unspecified: Secondary | ICD-10-CM | POA: Diagnosis not present

## 2023-07-03 DIAGNOSIS — N2581 Secondary hyperparathyroidism of renal origin: Secondary | ICD-10-CM | POA: Diagnosis not present

## 2023-07-04 DIAGNOSIS — N186 End stage renal disease: Secondary | ICD-10-CM | POA: Diagnosis not present

## 2023-07-04 DIAGNOSIS — Z992 Dependence on renal dialysis: Secondary | ICD-10-CM | POA: Diagnosis not present

## 2023-07-04 DIAGNOSIS — E1122 Type 2 diabetes mellitus with diabetic chronic kidney disease: Secondary | ICD-10-CM | POA: Diagnosis not present

## 2023-07-06 DIAGNOSIS — Z992 Dependence on renal dialysis: Secondary | ICD-10-CM | POA: Diagnosis not present

## 2023-07-06 DIAGNOSIS — E1122 Type 2 diabetes mellitus with diabetic chronic kidney disease: Secondary | ICD-10-CM | POA: Diagnosis not present

## 2023-07-06 DIAGNOSIS — D689 Coagulation defect, unspecified: Secondary | ICD-10-CM | POA: Diagnosis not present

## 2023-07-06 DIAGNOSIS — D631 Anemia in chronic kidney disease: Secondary | ICD-10-CM | POA: Diagnosis not present

## 2023-07-06 DIAGNOSIS — N2581 Secondary hyperparathyroidism of renal origin: Secondary | ICD-10-CM | POA: Diagnosis not present

## 2023-07-06 DIAGNOSIS — N186 End stage renal disease: Secondary | ICD-10-CM | POA: Diagnosis not present

## 2023-07-06 DIAGNOSIS — D509 Iron deficiency anemia, unspecified: Secondary | ICD-10-CM | POA: Diagnosis not present

## 2023-07-10 DIAGNOSIS — E1122 Type 2 diabetes mellitus with diabetic chronic kidney disease: Secondary | ICD-10-CM | POA: Diagnosis not present

## 2023-07-10 DIAGNOSIS — D689 Coagulation defect, unspecified: Secondary | ICD-10-CM | POA: Diagnosis not present

## 2023-07-10 DIAGNOSIS — N186 End stage renal disease: Secondary | ICD-10-CM | POA: Diagnosis not present

## 2023-07-10 DIAGNOSIS — D509 Iron deficiency anemia, unspecified: Secondary | ICD-10-CM | POA: Diagnosis not present

## 2023-07-10 DIAGNOSIS — Z992 Dependence on renal dialysis: Secondary | ICD-10-CM | POA: Diagnosis not present

## 2023-07-10 DIAGNOSIS — N2581 Secondary hyperparathyroidism of renal origin: Secondary | ICD-10-CM | POA: Diagnosis not present

## 2023-07-10 DIAGNOSIS — D631 Anemia in chronic kidney disease: Secondary | ICD-10-CM | POA: Diagnosis not present

## 2023-07-13 DIAGNOSIS — E1122 Type 2 diabetes mellitus with diabetic chronic kidney disease: Secondary | ICD-10-CM | POA: Diagnosis not present

## 2023-07-13 DIAGNOSIS — Z992 Dependence on renal dialysis: Secondary | ICD-10-CM | POA: Diagnosis not present

## 2023-07-13 DIAGNOSIS — D509 Iron deficiency anemia, unspecified: Secondary | ICD-10-CM | POA: Diagnosis not present

## 2023-07-13 DIAGNOSIS — D631 Anemia in chronic kidney disease: Secondary | ICD-10-CM | POA: Diagnosis not present

## 2023-07-13 DIAGNOSIS — D689 Coagulation defect, unspecified: Secondary | ICD-10-CM | POA: Diagnosis not present

## 2023-07-13 DIAGNOSIS — N2581 Secondary hyperparathyroidism of renal origin: Secondary | ICD-10-CM | POA: Diagnosis not present

## 2023-07-13 DIAGNOSIS — N186 End stage renal disease: Secondary | ICD-10-CM | POA: Diagnosis not present

## 2023-07-15 DIAGNOSIS — D631 Anemia in chronic kidney disease: Secondary | ICD-10-CM | POA: Diagnosis not present

## 2023-07-15 DIAGNOSIS — N2581 Secondary hyperparathyroidism of renal origin: Secondary | ICD-10-CM | POA: Diagnosis not present

## 2023-07-15 DIAGNOSIS — N186 End stage renal disease: Secondary | ICD-10-CM | POA: Diagnosis not present

## 2023-07-15 DIAGNOSIS — E1122 Type 2 diabetes mellitus with diabetic chronic kidney disease: Secondary | ICD-10-CM | POA: Diagnosis not present

## 2023-07-15 DIAGNOSIS — D689 Coagulation defect, unspecified: Secondary | ICD-10-CM | POA: Diagnosis not present

## 2023-07-15 DIAGNOSIS — Z992 Dependence on renal dialysis: Secondary | ICD-10-CM | POA: Diagnosis not present

## 2023-07-15 DIAGNOSIS — D509 Iron deficiency anemia, unspecified: Secondary | ICD-10-CM | POA: Diagnosis not present

## 2023-07-17 ENCOUNTER — Ambulatory Visit (INDEPENDENT_AMBULATORY_CARE_PROVIDER_SITE_OTHER): Payer: 59 | Admitting: Podiatry

## 2023-07-17 DIAGNOSIS — N186 End stage renal disease: Secondary | ICD-10-CM | POA: Diagnosis not present

## 2023-07-17 DIAGNOSIS — D509 Iron deficiency anemia, unspecified: Secondary | ICD-10-CM | POA: Diagnosis not present

## 2023-07-17 DIAGNOSIS — B351 Tinea unguium: Secondary | ICD-10-CM | POA: Diagnosis not present

## 2023-07-17 DIAGNOSIS — N2581 Secondary hyperparathyroidism of renal origin: Secondary | ICD-10-CM | POA: Diagnosis not present

## 2023-07-17 DIAGNOSIS — E1122 Type 2 diabetes mellitus with diabetic chronic kidney disease: Secondary | ICD-10-CM | POA: Diagnosis not present

## 2023-07-17 DIAGNOSIS — M79674 Pain in right toe(s): Secondary | ICD-10-CM | POA: Diagnosis not present

## 2023-07-17 DIAGNOSIS — M79675 Pain in left toe(s): Secondary | ICD-10-CM | POA: Diagnosis not present

## 2023-07-17 DIAGNOSIS — D631 Anemia in chronic kidney disease: Secondary | ICD-10-CM | POA: Diagnosis not present

## 2023-07-17 DIAGNOSIS — D689 Coagulation defect, unspecified: Secondary | ICD-10-CM | POA: Diagnosis not present

## 2023-07-17 DIAGNOSIS — Z992 Dependence on renal dialysis: Secondary | ICD-10-CM | POA: Diagnosis not present

## 2023-07-17 NOTE — Progress Notes (Signed)
Subjective:  Patient ID: Levi Hodges, male    DOB: 01-12-1976,  MRN: [id number]  Chief Complaint  Patient presents with   Nail Problem    Nail trim   47 y.o. male returns for the above complaint.  Patient presents with thickened elongated dystrophic toenails x10 mild pain on palpation.  He would like to have them debrided down he is a diabetic with controlled A1c.  He has not seen anyone else prior to seeing me for this.  Denies any other acute complaints  Objective:  There were no vitals filed for this visit. Podiatric Exam: Vascular: dorsalis pedis and posterior tibial pulses are palpable bilateral. Capillary return is immediate. Temperature gradient is WNL. Skin turgor WNL  Sensorium: Normal Semmes Weinstein monofilament test. Normal tactile sensation bilaterally. Nail Exam: Pt has thick disfigured discolored nails with subungual debris noted bilateral entire nail hallux through fifth toenails.  Pain on palpation to the nails. Ulcer Exam: There is no evidence of ulcer or pre-ulcerative changes or infection. Orthopedic Exam: Muscle tone and strength are WNL. No limitations in general ROM. No crepitus or effusions noted.  Skin: No Porokeratosis. No infection or ulcers    Assessment & Plan:   1. Pain due to onychomycosis of toenails of both feet      Patient was evaluated and treated and all questions answered.  Onychomycosis with pain  -Nails palliatively debrided as below. -Educated on self-care  Procedure: Nail Debridement Rationale: pain  Type of Debridement: manual, sharp debridement. Instrumentation: Nail nipper, rotary burr. Number of Nails: 10  Procedures and Treatment: Consent by patient was obtained for treatment procedures. The patient understood the discussion of treatment and procedures well. All questions were answered thoroughly reviewed. Debridement of mycotic and hypertrophic toenails, 1 through 5 bilateral and clearing of subungual debris. No ulceration,  no infection noted.  Return Visit-Office Procedure: Patient instructed to return to the office for a follow up visit 3 months for continued evaluation and treatment.  Nicholes Rough, DPM    No follow-ups on file.

## 2023-07-20 DIAGNOSIS — N2581 Secondary hyperparathyroidism of renal origin: Secondary | ICD-10-CM | POA: Diagnosis not present

## 2023-07-20 DIAGNOSIS — D509 Iron deficiency anemia, unspecified: Secondary | ICD-10-CM | POA: Diagnosis not present

## 2023-07-20 DIAGNOSIS — D689 Coagulation defect, unspecified: Secondary | ICD-10-CM | POA: Diagnosis not present

## 2023-07-20 DIAGNOSIS — E1122 Type 2 diabetes mellitus with diabetic chronic kidney disease: Secondary | ICD-10-CM | POA: Diagnosis not present

## 2023-07-20 DIAGNOSIS — D631 Anemia in chronic kidney disease: Secondary | ICD-10-CM | POA: Diagnosis not present

## 2023-07-20 DIAGNOSIS — Z992 Dependence on renal dialysis: Secondary | ICD-10-CM | POA: Diagnosis not present

## 2023-07-20 DIAGNOSIS — N186 End stage renal disease: Secondary | ICD-10-CM | POA: Diagnosis not present

## 2023-07-22 DIAGNOSIS — N186 End stage renal disease: Secondary | ICD-10-CM | POA: Diagnosis not present

## 2023-07-22 DIAGNOSIS — D631 Anemia in chronic kidney disease: Secondary | ICD-10-CM | POA: Diagnosis not present

## 2023-07-22 DIAGNOSIS — N2581 Secondary hyperparathyroidism of renal origin: Secondary | ICD-10-CM | POA: Diagnosis not present

## 2023-07-22 DIAGNOSIS — D509 Iron deficiency anemia, unspecified: Secondary | ICD-10-CM | POA: Diagnosis not present

## 2023-07-22 DIAGNOSIS — E1122 Type 2 diabetes mellitus with diabetic chronic kidney disease: Secondary | ICD-10-CM | POA: Diagnosis not present

## 2023-07-22 DIAGNOSIS — Z992 Dependence on renal dialysis: Secondary | ICD-10-CM | POA: Diagnosis not present

## 2023-07-22 DIAGNOSIS — D689 Coagulation defect, unspecified: Secondary | ICD-10-CM | POA: Diagnosis not present

## 2023-07-24 DIAGNOSIS — D509 Iron deficiency anemia, unspecified: Secondary | ICD-10-CM | POA: Diagnosis not present

## 2023-07-24 DIAGNOSIS — E1122 Type 2 diabetes mellitus with diabetic chronic kidney disease: Secondary | ICD-10-CM | POA: Diagnosis not present

## 2023-07-24 DIAGNOSIS — D689 Coagulation defect, unspecified: Secondary | ICD-10-CM | POA: Diagnosis not present

## 2023-07-24 DIAGNOSIS — N2581 Secondary hyperparathyroidism of renal origin: Secondary | ICD-10-CM | POA: Diagnosis not present

## 2023-07-24 DIAGNOSIS — D631 Anemia in chronic kidney disease: Secondary | ICD-10-CM | POA: Diagnosis not present

## 2023-07-24 DIAGNOSIS — Z992 Dependence on renal dialysis: Secondary | ICD-10-CM | POA: Diagnosis not present

## 2023-07-24 DIAGNOSIS — N186 End stage renal disease: Secondary | ICD-10-CM | POA: Diagnosis not present

## 2023-07-27 DIAGNOSIS — N2581 Secondary hyperparathyroidism of renal origin: Secondary | ICD-10-CM | POA: Diagnosis not present

## 2023-07-27 DIAGNOSIS — D631 Anemia in chronic kidney disease: Secondary | ICD-10-CM | POA: Diagnosis not present

## 2023-07-27 DIAGNOSIS — D689 Coagulation defect, unspecified: Secondary | ICD-10-CM | POA: Diagnosis not present

## 2023-07-27 DIAGNOSIS — D509 Iron deficiency anemia, unspecified: Secondary | ICD-10-CM | POA: Diagnosis not present

## 2023-07-27 DIAGNOSIS — E1122 Type 2 diabetes mellitus with diabetic chronic kidney disease: Secondary | ICD-10-CM | POA: Diagnosis not present

## 2023-07-27 DIAGNOSIS — Z992 Dependence on renal dialysis: Secondary | ICD-10-CM | POA: Diagnosis not present

## 2023-07-27 DIAGNOSIS — N186 End stage renal disease: Secondary | ICD-10-CM | POA: Diagnosis not present

## 2023-07-29 DIAGNOSIS — Z992 Dependence on renal dialysis: Secondary | ICD-10-CM | POA: Diagnosis not present

## 2023-07-29 DIAGNOSIS — N2581 Secondary hyperparathyroidism of renal origin: Secondary | ICD-10-CM | POA: Diagnosis not present

## 2023-07-29 DIAGNOSIS — D689 Coagulation defect, unspecified: Secondary | ICD-10-CM | POA: Diagnosis not present

## 2023-07-29 DIAGNOSIS — D631 Anemia in chronic kidney disease: Secondary | ICD-10-CM | POA: Diagnosis not present

## 2023-07-29 DIAGNOSIS — N186 End stage renal disease: Secondary | ICD-10-CM | POA: Diagnosis not present

## 2023-07-29 DIAGNOSIS — D509 Iron deficiency anemia, unspecified: Secondary | ICD-10-CM | POA: Diagnosis not present

## 2023-07-29 DIAGNOSIS — E1122 Type 2 diabetes mellitus with diabetic chronic kidney disease: Secondary | ICD-10-CM | POA: Diagnosis not present

## 2023-07-31 DIAGNOSIS — E1122 Type 2 diabetes mellitus with diabetic chronic kidney disease: Secondary | ICD-10-CM | POA: Diagnosis not present

## 2023-07-31 DIAGNOSIS — D509 Iron deficiency anemia, unspecified: Secondary | ICD-10-CM | POA: Diagnosis not present

## 2023-07-31 DIAGNOSIS — N2581 Secondary hyperparathyroidism of renal origin: Secondary | ICD-10-CM | POA: Diagnosis not present

## 2023-07-31 DIAGNOSIS — D631 Anemia in chronic kidney disease: Secondary | ICD-10-CM | POA: Diagnosis not present

## 2023-07-31 DIAGNOSIS — Z992 Dependence on renal dialysis: Secondary | ICD-10-CM | POA: Diagnosis not present

## 2023-07-31 DIAGNOSIS — D689 Coagulation defect, unspecified: Secondary | ICD-10-CM | POA: Diagnosis not present

## 2023-07-31 DIAGNOSIS — N186 End stage renal disease: Secondary | ICD-10-CM | POA: Diagnosis not present

## 2023-08-03 DIAGNOSIS — D509 Iron deficiency anemia, unspecified: Secondary | ICD-10-CM | POA: Diagnosis not present

## 2023-08-03 DIAGNOSIS — Z992 Dependence on renal dialysis: Secondary | ICD-10-CM | POA: Diagnosis not present

## 2023-08-03 DIAGNOSIS — N186 End stage renal disease: Secondary | ICD-10-CM | POA: Diagnosis not present

## 2023-08-03 DIAGNOSIS — D631 Anemia in chronic kidney disease: Secondary | ICD-10-CM | POA: Diagnosis not present

## 2023-08-03 DIAGNOSIS — N2581 Secondary hyperparathyroidism of renal origin: Secondary | ICD-10-CM | POA: Diagnosis not present

## 2023-08-03 DIAGNOSIS — D689 Coagulation defect, unspecified: Secondary | ICD-10-CM | POA: Diagnosis not present

## 2023-08-03 DIAGNOSIS — E1122 Type 2 diabetes mellitus with diabetic chronic kidney disease: Secondary | ICD-10-CM | POA: Diagnosis not present

## 2023-08-05 DIAGNOSIS — N186 End stage renal disease: Secondary | ICD-10-CM | POA: Diagnosis not present

## 2023-08-05 DIAGNOSIS — R110 Nausea: Secondary | ICD-10-CM | POA: Diagnosis not present

## 2023-08-05 DIAGNOSIS — R6883 Chills (without fever): Secondary | ICD-10-CM | POA: Diagnosis not present

## 2023-08-05 DIAGNOSIS — D689 Coagulation defect, unspecified: Secondary | ICD-10-CM | POA: Diagnosis not present

## 2023-08-05 DIAGNOSIS — D631 Anemia in chronic kidney disease: Secondary | ICD-10-CM | POA: Diagnosis not present

## 2023-08-05 DIAGNOSIS — N2581 Secondary hyperparathyroidism of renal origin: Secondary | ICD-10-CM | POA: Diagnosis not present

## 2023-08-05 DIAGNOSIS — Z992 Dependence on renal dialysis: Secondary | ICD-10-CM | POA: Diagnosis not present

## 2023-08-05 DIAGNOSIS — R112 Nausea with vomiting, unspecified: Secondary | ICD-10-CM | POA: Diagnosis not present

## 2023-08-07 DIAGNOSIS — Z992 Dependence on renal dialysis: Secondary | ICD-10-CM | POA: Diagnosis not present

## 2023-08-07 DIAGNOSIS — N2581 Secondary hyperparathyroidism of renal origin: Secondary | ICD-10-CM | POA: Diagnosis not present

## 2023-08-07 DIAGNOSIS — R112 Nausea with vomiting, unspecified: Secondary | ICD-10-CM | POA: Diagnosis not present

## 2023-08-07 DIAGNOSIS — R110 Nausea: Secondary | ICD-10-CM | POA: Diagnosis not present

## 2023-08-07 DIAGNOSIS — D689 Coagulation defect, unspecified: Secondary | ICD-10-CM | POA: Diagnosis not present

## 2023-08-07 DIAGNOSIS — N186 End stage renal disease: Secondary | ICD-10-CM | POA: Diagnosis not present

## 2023-08-07 DIAGNOSIS — D631 Anemia in chronic kidney disease: Secondary | ICD-10-CM | POA: Diagnosis not present

## 2023-08-07 DIAGNOSIS — R6883 Chills (without fever): Secondary | ICD-10-CM | POA: Diagnosis not present

## 2023-08-10 DIAGNOSIS — R6883 Chills (without fever): Secondary | ICD-10-CM | POA: Diagnosis not present

## 2023-08-10 DIAGNOSIS — R110 Nausea: Secondary | ICD-10-CM | POA: Diagnosis not present

## 2023-08-10 DIAGNOSIS — N2581 Secondary hyperparathyroidism of renal origin: Secondary | ICD-10-CM | POA: Diagnosis not present

## 2023-08-10 DIAGNOSIS — D631 Anemia in chronic kidney disease: Secondary | ICD-10-CM | POA: Diagnosis not present

## 2023-08-10 DIAGNOSIS — N186 End stage renal disease: Secondary | ICD-10-CM | POA: Diagnosis not present

## 2023-08-10 DIAGNOSIS — R112 Nausea with vomiting, unspecified: Secondary | ICD-10-CM | POA: Diagnosis not present

## 2023-08-10 DIAGNOSIS — D689 Coagulation defect, unspecified: Secondary | ICD-10-CM | POA: Diagnosis not present

## 2023-08-10 DIAGNOSIS — Z992 Dependence on renal dialysis: Secondary | ICD-10-CM | POA: Diagnosis not present

## 2023-08-12 ENCOUNTER — Other Ambulatory Visit: Payer: Self-pay | Admitting: Internal Medicine

## 2023-08-12 DIAGNOSIS — R112 Nausea with vomiting, unspecified: Secondary | ICD-10-CM | POA: Diagnosis not present

## 2023-08-12 DIAGNOSIS — R6883 Chills (without fever): Secondary | ICD-10-CM | POA: Diagnosis not present

## 2023-08-12 DIAGNOSIS — R110 Nausea: Secondary | ICD-10-CM | POA: Diagnosis not present

## 2023-08-12 DIAGNOSIS — N186 End stage renal disease: Secondary | ICD-10-CM | POA: Diagnosis not present

## 2023-08-12 DIAGNOSIS — D631 Anemia in chronic kidney disease: Secondary | ICD-10-CM | POA: Diagnosis not present

## 2023-08-12 DIAGNOSIS — Z992 Dependence on renal dialysis: Secondary | ICD-10-CM | POA: Diagnosis not present

## 2023-08-12 DIAGNOSIS — D689 Coagulation defect, unspecified: Secondary | ICD-10-CM | POA: Diagnosis not present

## 2023-08-12 DIAGNOSIS — N2581 Secondary hyperparathyroidism of renal origin: Secondary | ICD-10-CM | POA: Diagnosis not present

## 2023-08-14 DIAGNOSIS — D689 Coagulation defect, unspecified: Secondary | ICD-10-CM | POA: Diagnosis not present

## 2023-08-14 DIAGNOSIS — R6883 Chills (without fever): Secondary | ICD-10-CM | POA: Diagnosis not present

## 2023-08-14 DIAGNOSIS — N186 End stage renal disease: Secondary | ICD-10-CM | POA: Diagnosis not present

## 2023-08-14 DIAGNOSIS — R112 Nausea with vomiting, unspecified: Secondary | ICD-10-CM | POA: Diagnosis not present

## 2023-08-14 DIAGNOSIS — N2581 Secondary hyperparathyroidism of renal origin: Secondary | ICD-10-CM | POA: Diagnosis not present

## 2023-08-14 DIAGNOSIS — Z992 Dependence on renal dialysis: Secondary | ICD-10-CM | POA: Diagnosis not present

## 2023-08-14 DIAGNOSIS — D631 Anemia in chronic kidney disease: Secondary | ICD-10-CM | POA: Diagnosis not present

## 2023-08-14 DIAGNOSIS — R110 Nausea: Secondary | ICD-10-CM | POA: Diagnosis not present

## 2023-08-17 DIAGNOSIS — D689 Coagulation defect, unspecified: Secondary | ICD-10-CM | POA: Diagnosis not present

## 2023-08-17 DIAGNOSIS — N186 End stage renal disease: Secondary | ICD-10-CM | POA: Diagnosis not present

## 2023-08-17 DIAGNOSIS — R112 Nausea with vomiting, unspecified: Secondary | ICD-10-CM | POA: Diagnosis not present

## 2023-08-17 DIAGNOSIS — N2581 Secondary hyperparathyroidism of renal origin: Secondary | ICD-10-CM | POA: Diagnosis not present

## 2023-08-17 DIAGNOSIS — R110 Nausea: Secondary | ICD-10-CM | POA: Diagnosis not present

## 2023-08-17 DIAGNOSIS — D631 Anemia in chronic kidney disease: Secondary | ICD-10-CM | POA: Diagnosis not present

## 2023-08-17 DIAGNOSIS — Z992 Dependence on renal dialysis: Secondary | ICD-10-CM | POA: Diagnosis not present

## 2023-08-17 DIAGNOSIS — R6883 Chills (without fever): Secondary | ICD-10-CM | POA: Diagnosis not present

## 2023-08-19 ENCOUNTER — Other Ambulatory Visit: Payer: Self-pay

## 2023-08-19 ENCOUNTER — Emergency Department (HOSPITAL_COMMUNITY)
Admission: EM | Admit: 2023-08-19 | Discharge: 2023-08-20 | Disposition: A | Discharge status: Home / Self Care | Payer: 59 | Attending: Emergency Medicine | Admitting: Emergency Medicine

## 2023-08-19 DIAGNOSIS — Z794 Long term (current) use of insulin: Secondary | ICD-10-CM | POA: Diagnosis not present

## 2023-08-19 DIAGNOSIS — R6883 Chills (without fever): Secondary | ICD-10-CM | POA: Diagnosis not present

## 2023-08-19 DIAGNOSIS — I120 Hypertensive chronic kidney disease with stage 5 chronic kidney disease or end stage renal disease: Secondary | ICD-10-CM | POA: Insufficient documentation

## 2023-08-19 DIAGNOSIS — Z79899 Other long term (current) drug therapy: Secondary | ICD-10-CM | POA: Insufficient documentation

## 2023-08-19 DIAGNOSIS — R109 Unspecified abdominal pain: Secondary | ICD-10-CM | POA: Diagnosis not present

## 2023-08-19 DIAGNOSIS — D689 Coagulation defect, unspecified: Secondary | ICD-10-CM | POA: Diagnosis not present

## 2023-08-19 DIAGNOSIS — E1122 Type 2 diabetes mellitus with diabetic chronic kidney disease: Secondary | ICD-10-CM | POA: Insufficient documentation

## 2023-08-19 DIAGNOSIS — K5730 Diverticulosis of large intestine without perforation or abscess without bleeding: Secondary | ICD-10-CM | POA: Diagnosis not present

## 2023-08-19 DIAGNOSIS — R112 Nausea with vomiting, unspecified: Secondary | ICD-10-CM | POA: Diagnosis not present

## 2023-08-19 DIAGNOSIS — D631 Anemia in chronic kidney disease: Secondary | ICD-10-CM | POA: Diagnosis not present

## 2023-08-19 DIAGNOSIS — Z992 Dependence on renal dialysis: Secondary | ICD-10-CM | POA: Diagnosis not present

## 2023-08-19 DIAGNOSIS — N2581 Secondary hyperparathyroidism of renal origin: Secondary | ICD-10-CM | POA: Diagnosis not present

## 2023-08-19 DIAGNOSIS — N186 End stage renal disease: Secondary | ICD-10-CM | POA: Insufficient documentation

## 2023-08-19 DIAGNOSIS — R110 Nausea: Secondary | ICD-10-CM | POA: Diagnosis not present

## 2023-08-19 LAB — CBC
HCT: 41.6 % (ref 39.0–52.0)
Hemoglobin: 13.7 g/dL (ref 13.0–17.0)
MCH: 29.1 pg (ref 26.0–34.0)
MCHC: 32.9 g/dL (ref 30.0–36.0)
MCV: 88.5 fL (ref 80.0–100.0)
Platelets: 270 10*3/uL (ref 150–400)
RBC: 4.7 MIL/uL (ref 4.22–5.81)
RDW: 14.1 % (ref 11.5–15.5)
WBC: 6 10*3/uL (ref 4.0–10.5)
nRBC: 0 % (ref 0.0–0.2)

## 2023-08-19 LAB — BASIC METABOLIC PANEL
Anion gap: 13 (ref 5–15)
BUN: 26 mg/dL — ABNORMAL HIGH (ref 6–20)
CO2: 30 mmol/L (ref 22–32)
Calcium: 8.7 mg/dL — ABNORMAL LOW (ref 8.9–10.3)
Chloride: 90 mmol/L — ABNORMAL LOW (ref 98–111)
Creatinine, Ser: 7.76 mg/dL — ABNORMAL HIGH (ref 0.61–1.24)
GFR, Estimated: 8 mL/min — ABNORMAL LOW (ref >60)
Glucose, Bld: 91 mg/dL (ref 70–99)
Potassium: 3.8 mmol/L (ref 3.5–5.1)
Sodium: 133 mmol/L — ABNORMAL LOW (ref 135–145)

## 2023-08-19 NOTE — ED Triage Notes (Signed)
Pt c/o right sided flank pain x 1 month ago worsening 2 weeks ago. Denies urinary s/s. Ednorses lower urinary output. No blood in urine. No hx of kidney stones.   Pt is MWF dialysis pt. Last dialysis today, complete treatment.

## 2023-08-19 NOTE — ED Notes (Signed)
Pt provided urine specimen, but is a dialysis pt and unable to urinate enough for lab- will need to recollect

## 2023-08-20 ENCOUNTER — Emergency Department (HOSPITAL_COMMUNITY): Payer: 59

## 2023-08-20 DIAGNOSIS — R109 Unspecified abdominal pain: Secondary | ICD-10-CM | POA: Diagnosis not present

## 2023-08-20 DIAGNOSIS — K5730 Diverticulosis of large intestine without perforation or abscess without bleeding: Secondary | ICD-10-CM | POA: Diagnosis not present

## 2023-08-20 LAB — CBG MONITORING, ED: Glucose-Capillary: 84 mg/dL (ref 70–99)

## 2023-08-20 MED ORDER — OXYCODONE-ACETAMINOPHEN 5-325 MG PO TABS
1.0000 | ORAL_TABLET | Freq: Once | ORAL | Status: AC
  Administered 2023-08-20: 1 via ORAL
  Filled 2023-08-20: qty 1

## 2023-08-20 NOTE — Discharge Instructions (Addendum)
CT scan today did not show any acute abnormalities, specifically no kidney stones or other concerning findings. I would discuss this with nephrology at your next HD session if this persists. Return here for new concerns.

## 2023-08-20 NOTE — ED Notes (Signed)
Pt requests blood sugar test

## 2023-08-20 NOTE — ED Provider Notes (Signed)
[place] EMERGENCY DEPARTMENT AT [facility] Provider Note   CSN: [id number] Arrival date & time: 08/19/23  2102     History  Chief Complaint  Patient presents with   Flank Pain    R    Shaina Beaudet is a 47 y.o. male.  The history is provided by the patient and medical records.  Flank Pain   47 y.o. M with hx of HTN, DM, anemia, ESRD on HD, presenting to the ED for right flank pain.  Has been ongoing for about 2 months now, worse over the past 2 weeks.  States he feels worse since starting dialysis.  He has had some sporadic nausea/vomiting, nothing consistent.  No fever/chills.  No rashes.  No hx kidney stones.  Denies dysuria or hematuria-- only makes small amount of urine about 2x daily.  Had full HD treatment today as scheduled, no issues/bleeding.  Home Medications Prior to Admission medications   Medication Sig Start Date End Date Taking? Authorizing Provider  amLODipine (NORVASC) 10 MG tablet TAKE 1 TABLET(10 MG) BY MOUTH DAILY 01/29/23  Yes [name], MD  AURYXIA 1 GM 210 MG(Fe) tablet Take 420 mg by mouth 3 (three) times daily. 07/21/23  Yes [provider]  carvedilol (COREG) 6.25 MG tablet Take 1-2 tablets (6.25-12.5 mg total) by mouth 2 (two) times daily with a meal. Patient taking differently: Take 6.25-12.5 mg by mouth See admin instructions. 12.5 mg in the morning, 6.25 mg in the evening 07/04/22  Yes [name], MD  gabapentin (NEURONTIN) 100 MG capsule Take 3 capsules (300 mg total) by mouth 2 (two) times daily. 10/09/22  Yes [name], MD  metoCLOPramide (REGLAN) 5 MG tablet Take 5 mg by mouth 2 (two) times daily. 08/07/23  Yes [provider]  Multiple Vitamin (DAILY-VITE MULTIVITAMIN) TABS Take 1 tablet by mouth daily with supper. 06/04/22  Yes [provider]  sevelamer carbonate (RENVELA) 800 MG tablet Take 1 tablet (800 mg total) by mouth 3 (three) times daily with meals. 05/21/22  Yes [name], MD  simvastatin (ZOCOR) 20 MG tablet TAKE 1 TABLET(20 MG) BY MOUTH AT BEDTIME 04/25/22  Yes [name], MD  B Complex-C-Zn-Folic Acid (DIALYVITE 800 WITH ZINC) 0.8 MG TABS Take 1 tablet by mouth daily. Patient not taking: Reported on 08/20/2023 06/15/23   [provider]  blood glucose meter kit and supplies Dispense based on patient and insurance preference. Use up to four times daily as directed. (FOR ICD-10 E10.9, E11.9). 07/14/22   [name], MD  Blood Glucose Monitoring Suppl (ACCU-CHEK AVIVA PLUS) w/Device KIT Use up to 4 times a day 07/28/22   [name], MD  glucose blood (ACCU-CHEK GUIDE) test strip USE AS DIRECTED UP TO FOUR TIMES DAILY 08/12/23   [name], MD  Insulin Pen Needle (PEN NEEDLES) 31G X 5 MM MISC 1 Package by Does not apply route daily. 04/15/19   [name], [name], MD  Lancets (ACCU-CHEK MULTICLIX) lancets Use as instructed up to 4 times a day 07/28/22   [name], MD      Allergies    Patient has no known allergies.    Review of Systems   Review of Systems  Genitourinary:  Positive for flank pain.    Physical Exam Updated Vital Signs BP 130/85   Pulse 67   Temp 98.5 F (36.9 C) (Oral)   Resp 13   Ht 5\' 9"  (1.753 m)  Wt 61.2 kg   SpO2 98%   BMI 19.94 kg/m   Physical Exam Vitals and nursing note reviewed.  Constitutional:      Appearance: He is well-developed.  HENT:     Head: Normocephalic and atraumatic.  Eyes:     Conjunctiva/sclera: Conjunctivae normal.     Pupils: Pupils are equal, round, and reactive to light.  Cardiovascular:     Rate and Rhythm: Normal rate and regular rhythm.     Heart sounds: Normal heart sounds.  Pulmonary:     Effort: Pulmonary effort is normal.     Breath sounds: Normal breath sounds.  Abdominal:     General: Bowel sounds are normal.     Palpations: Abdomen is soft.     Comments: Mild tenderness along right flank, no rashes or  other skin abnormalities  Musculoskeletal:        General: Normal range of motion.     Cervical back: Normal range of motion.     Comments: Fistula LUE, thrill present, bandaged without bleeding  Skin:    General: Skin is warm and dry.  Neurological:     Mental Status: He is alert and oriented to person, place, and time.     ED Results / Procedures / Treatments   Labs (all labs ordered are listed, but only abnormal results are displayed) Labs Reviewed  BASIC METABOLIC PANEL - Abnormal; Notable for the following components:      Result Value   Sodium 133 (*)    Chloride 90 (*)    BUN 26 (*)    Creatinine, Ser 7.76 (*)    Calcium 8.7 (*)    GFR, Estimated 8 (*)    All other components within normal limits  CBC  URINALYSIS, ROUTINE W REFLEX MICROSCOPIC  CBG MONITORING, ED    EKG None  Radiology CT Renal Stone Study  Result Date: 08/20/2023 CLINICAL DATA:  Abdominal/flank pain with stone suspected EXAM: CT ABDOMEN AND PELVIS WITHOUT CONTRAST TECHNIQUE: Multidetector CT imaging of the abdomen and pelvis was performed following the standard protocol without IV contrast. RADIATION DOSE REDUCTION: This exam was performed according to the departmental dose-optimization program which includes automated exposure control, adjustment of the mA and/or kV according to patient size and/or use of iterative reconstruction technique. COMPARISON:  04/09/2023 FINDINGS: Lower chest:  No contributory findings. Hepatobiliary: No focal liver abnormality.No evidence of biliary obstruction or stone. Pancreas: Unremarkable. Spleen: Unremarkable. Adrenals/Urinary Tract: Negative adrenals. No hydronephrosis or stone. Symmetric renal atrophy, especially for age, with smooth cortex. Renal length measures 8 cm on the right. Unremarkable bladder. Stomach/Bowel: No obstruction. No appendicitis. Scattered colonic diverticula without superimposed inflammation. Desiccated stool in the distal colon.  Vascular/Lymphatic: No acute vascular abnormality. No mass or adenopathy. Reproductive:No pathologic findings. Other: No ascites or pneumoperitoneum. Musculoskeletal: No acute abnormalities. Spondylosis and facet spurring. IMPRESSION: 1. No acute finding.  No hydronephrosis or nephrolithiasis. 2. Colonic diverticulosis. 3. Symmetric renal atrophy. Electronically Signed   By: Tiburcio Pea M.D.   On: 08/20/2023 04:44    Procedures Procedures    Medications Ordered in ED Medications  oxyCODONE-acetaminophen (PERCOCET/ROXICET) 5-325 MG per tablet 1 tablet (1 tablet Oral Given 08/20/23 0030)    ED Course/ Medical Decision Making/ A&P                                 Medical Decision Making Amount and/or Complexity of Data Reviewed Labs: ordered. Radiology: ordered  and independent interpretation performed. ECG/medicine tests: ordered and independent interpretation performed.  Risk Prescription drug management.   47 year old male presenting to the ED with right flank pain.  This has been ongoing for a few months now, worse over the past 2 weeks.  He is a hemodialysis patient, still makes small amounts of urine once or twice a day.  He denies any dysuria or frank hematuria.  He is afebrile and nontoxic in appearance.  She does have some mild tenderness to the flank without peritoneal signs.  No rash or overlying skin changes to suggest shingles.  Labs were obtained--appear consistent with his dialysis status, potassium is normal.  He did have full treatment earlier today.  He was able to provide small urine sample, however not enough quantity for lab to run UA.  He will attempt again.  Will obtain CT renal stone study.  CT without any acute findings.  He was not able to urinate again.  At this point, feel he is stable for discharge.  Symptoms have been ongoing for quite some time now, he has not yet discussed this with his nephrologist nor PCP.  I recommended that he discuss with them at his  next appointment on Friday.  They should be able to review imaging and labs from today's visit.  Can return here for any new or acute changes.  Final Clinical Impression(s) / ED Diagnoses Final diagnoses:  Flank pain    Rx / DC Orders ED Discharge Orders     None         Garlon Hatchet, PA-C 08/20/23 0548    Nira Conn, MD 08/21/23 [phone]

## 2023-08-21 ENCOUNTER — Telehealth (INDEPENDENT_AMBULATORY_CARE_PROVIDER_SITE_OTHER): Payer: 59 | Admitting: Internal Medicine

## 2023-08-21 ENCOUNTER — Encounter: Payer: Self-pay | Admitting: Internal Medicine

## 2023-08-21 DIAGNOSIS — R112 Nausea with vomiting, unspecified: Secondary | ICD-10-CM | POA: Diagnosis not present

## 2023-08-21 DIAGNOSIS — R6883 Chills (without fever): Secondary | ICD-10-CM | POA: Diagnosis not present

## 2023-08-21 DIAGNOSIS — R110 Nausea: Secondary | ICD-10-CM | POA: Diagnosis not present

## 2023-08-21 DIAGNOSIS — D631 Anemia in chronic kidney disease: Secondary | ICD-10-CM | POA: Diagnosis not present

## 2023-08-21 DIAGNOSIS — D689 Coagulation defect, unspecified: Secondary | ICD-10-CM | POA: Diagnosis not present

## 2023-08-21 DIAGNOSIS — Z992 Dependence on renal dialysis: Secondary | ICD-10-CM | POA: Diagnosis not present

## 2023-08-21 DIAGNOSIS — N186 End stage renal disease: Secondary | ICD-10-CM | POA: Diagnosis not present

## 2023-08-21 DIAGNOSIS — N2581 Secondary hyperparathyroidism of renal origin: Secondary | ICD-10-CM | POA: Diagnosis not present

## 2023-08-21 DIAGNOSIS — R1013 Epigastric pain: Secondary | ICD-10-CM | POA: Diagnosis not present

## 2023-08-21 MED ORDER — PANTOPRAZOLE SODIUM 40 MG PO TBEC: 40.0000 mg | DELAYED_RELEASE_TABLET | Freq: Every day | ORAL | 3 refills | Status: DC

## 2023-08-21 MED ORDER — ONDANSETRON HCL 4 MG PO TABS: 4.0000 mg | ORAL_TABLET | Freq: Three times a day (TID) | ORAL | 3 refills | Status: DC | PRN

## 2023-08-21 NOTE — Progress Notes (Signed)
Virtual Visit via Video Note  I connected with Levi Hodges on 08/21/23 at  4:00 PM EDT by a video enabled telemedicine application and verified that I am speaking with the correct person using two identifiers.  The patient and the provider were at separate locations throughout the entire encounter. Patient location: home, Provider location: work   I discussed the limitations of evaluation and management by telemedicine and the availability of in person appointments. The patient expressed understanding and agreed to proceed. The patient and the provider were the only parties present for the visit unless noted in HPI below.  History of Present Illness: The patient is a 47 y.o. man with visit for abdominal pain bottom and top. Throwing up most foods in the last few days. Constipation.  Started in the last few months but worse lately. Tried miralax this did not help.  Observations/Objective: Appearance: normal, breathing appears normal, casual grooming, abdomen not visualized  Assessment and Plan: See problem oriented charting  Follow Up Instructions: protonix 40 mg daily start and zofran 4 mg prn for nausea, keep taking miralax to have bm  I discussed the assessment and treatment plan with the patient. The patient was provided an opportunity to ask questions and all were answered. The patient agreed with the plan and demonstrated an understanding of the instructions.   The patient was advised to call back or seek an in-person evaluation if the symptoms worsen or if the condition fails to improve as anticipated.  [name], MD

## 2023-08-21 NOTE — Assessment & Plan Note (Signed)
Rx zofran for nausea and vomiting to help with symptoms. Nephrology gave him reglan which did not help. He has not had gastric emptying to prove gastroparesis but this is possible. He is having some constipation now and started miralax will continue using to have BM.

## 2023-08-21 NOTE — Assessment & Plan Note (Signed)
Currently epigastric but both upper and lower pain over last few months. Starting protonix 40 mg daily to see if this helps. Referral to GI for further assessment. He may need endoscopy, gastric emptying study.

## 2023-08-24 ENCOUNTER — Observation Stay (HOSPITAL_COMMUNITY)
Admission: EM | Admit: 2023-08-24 | Discharge: 2023-08-27 | Disposition: A | Discharge status: Home / Self Care | Payer: 59 | Attending: Family Medicine | Admitting: Family Medicine

## 2023-08-24 ENCOUNTER — Encounter (HOSPITAL_COMMUNITY): Payer: Self-pay

## 2023-08-24 ENCOUNTER — Encounter: Payer: Self-pay | Admitting: Internal Medicine

## 2023-08-24 DIAGNOSIS — N186 End stage renal disease: Secondary | ICD-10-CM | POA: Diagnosis not present

## 2023-08-24 DIAGNOSIS — F17210 Nicotine dependence, cigarettes, uncomplicated: Secondary | ICD-10-CM | POA: Diagnosis not present

## 2023-08-24 DIAGNOSIS — I5032 Chronic diastolic (congestive) heart failure: Secondary | ICD-10-CM | POA: Diagnosis not present

## 2023-08-24 DIAGNOSIS — K819 Cholecystitis, unspecified: Secondary | ICD-10-CM

## 2023-08-24 DIAGNOSIS — R1011 Right upper quadrant pain: Secondary | ICD-10-CM | POA: Diagnosis not present

## 2023-08-24 DIAGNOSIS — D689 Coagulation defect, unspecified: Secondary | ICD-10-CM | POA: Diagnosis not present

## 2023-08-24 DIAGNOSIS — K8000 Calculus of gallbladder with acute cholecystitis without obstruction: Secondary | ICD-10-CM | POA: Diagnosis present

## 2023-08-24 DIAGNOSIS — Z79899 Other long term (current) drug therapy: Secondary | ICD-10-CM | POA: Insufficient documentation

## 2023-08-24 DIAGNOSIS — R112 Nausea with vomiting, unspecified: Secondary | ICD-10-CM

## 2023-08-24 DIAGNOSIS — Z720 Tobacco use: Secondary | ICD-10-CM | POA: Diagnosis present

## 2023-08-24 DIAGNOSIS — K8020 Calculus of gallbladder without cholecystitis without obstruction: Secondary | ICD-10-CM | POA: Diagnosis not present

## 2023-08-24 DIAGNOSIS — Z1152 Encounter for screening for COVID-19: Secondary | ICD-10-CM | POA: Insufficient documentation

## 2023-08-24 DIAGNOSIS — I120 Hypertensive chronic kidney disease with stage 5 chronic kidney disease or end stage renal disease: Secondary | ICD-10-CM | POA: Diagnosis not present

## 2023-08-24 DIAGNOSIS — R110 Nausea: Secondary | ICD-10-CM | POA: Diagnosis not present

## 2023-08-24 DIAGNOSIS — K8012 Calculus of gallbladder with acute and chronic cholecystitis without obstruction: Secondary | ICD-10-CM | POA: Diagnosis not present

## 2023-08-24 DIAGNOSIS — D631 Anemia in chronic kidney disease: Secondary | ICD-10-CM | POA: Diagnosis not present

## 2023-08-24 DIAGNOSIS — Z992 Dependence on renal dialysis: Secondary | ICD-10-CM | POA: Diagnosis not present

## 2023-08-24 DIAGNOSIS — I160 Hypertensive urgency: Secondary | ICD-10-CM | POA: Diagnosis present

## 2023-08-24 DIAGNOSIS — K828 Other specified diseases of gallbladder: Secondary | ICD-10-CM | POA: Diagnosis not present

## 2023-08-24 DIAGNOSIS — E1122 Type 2 diabetes mellitus with diabetic chronic kidney disease: Secondary | ICD-10-CM | POA: Insufficient documentation

## 2023-08-24 DIAGNOSIS — Z794 Long term (current) use of insulin: Secondary | ICD-10-CM | POA: Diagnosis not present

## 2023-08-24 DIAGNOSIS — E119 Type 2 diabetes mellitus without complications: Secondary | ICD-10-CM

## 2023-08-24 DIAGNOSIS — K8010 Calculus of gallbladder with chronic cholecystitis without obstruction: Secondary | ICD-10-CM | POA: Diagnosis present

## 2023-08-24 DIAGNOSIS — E876 Hypokalemia: Secondary | ICD-10-CM | POA: Diagnosis present

## 2023-08-24 DIAGNOSIS — R6883 Chills (without fever): Secondary | ICD-10-CM | POA: Diagnosis not present

## 2023-08-24 DIAGNOSIS — N2581 Secondary hyperparathyroidism of renal origin: Secondary | ICD-10-CM | POA: Diagnosis not present

## 2023-08-24 LAB — COMPREHENSIVE METABOLIC PANEL
ALT: 22 U/L (ref 0–44)
AST: 31 U/L (ref 15–41)
Albumin: 4.4 g/dL (ref 3.5–5.0)
Alkaline Phosphatase: 52 U/L (ref 38–126)
Anion gap: 18 — ABNORMAL HIGH (ref 5–15)
BUN: 22 mg/dL — ABNORMAL HIGH (ref 6–20)
CO2: 26 mmol/L (ref 22–32)
Calcium: 8.9 mg/dL (ref 8.9–10.3)
Chloride: 92 mmol/L — ABNORMAL LOW (ref 98–111)
Creatinine, Ser: 9.36 mg/dL — ABNORMAL HIGH (ref 0.61–1.24)
GFR, Estimated: 6 mL/min — ABNORMAL LOW (ref >60)
Glucose, Bld: 73 mg/dL (ref 70–99)
Potassium: 3.4 mmol/L — ABNORMAL LOW (ref 3.5–5.1)
Sodium: 136 mmol/L (ref 135–145)
Total Bilirubin: 1.2 mg/dL (ref 0.3–1.2)
Total Protein: 8.4 g/dL — ABNORMAL HIGH (ref 6.5–8.1)

## 2023-08-24 LAB — CBC
HCT: 40.7 % (ref 39.0–52.0)
Hemoglobin: 13.7 g/dL (ref 13.0–17.0)
MCH: 29.1 pg (ref 26.0–34.0)
MCHC: 33.7 g/dL (ref 30.0–36.0)
MCV: 86.4 fL (ref 80.0–100.0)
Platelets: 305 10*3/uL (ref 150–400)
RBC: 4.71 MIL/uL (ref 4.22–5.81)
RDW: 13.7 % (ref 11.5–15.5)
WBC: 5.4 10*3/uL (ref 4.0–10.5)
nRBC: 0 % (ref 0.0–0.2)

## 2023-08-24 LAB — LIPASE, BLOOD: Lipase: 39 U/L (ref 11–51)

## 2023-08-24 MED ORDER — PROMETHAZINE HCL 25 MG PO TABS
25.0000 mg | ORAL_TABLET | Freq: Once | ORAL | Status: AC
  Administered 2023-08-24: 25 mg via ORAL
  Filled 2023-08-24: qty 1

## 2023-08-24 MED ORDER — ONDANSETRON 4 MG PO TBDP: 4.0000 mg | ORAL_TABLET | Freq: Once | ORAL | Status: DC | PRN

## 2023-08-24 NOTE — ED Triage Notes (Addendum)
Pt arrived POV for continued abd pain, was seen at [facility] 10/16 for flank pain, CT clear, pt is a dialysis pt, had dialysis today, pt reports was not able to pull any fluids off, did complete session. Pt reports n/v and abd pain since last week, reports usually does not produce a lot of urine. HTN noted, otherwise VSS, A&O x4. Zofran 4mg  PTA

## 2023-08-24 NOTE — ED Provider Triage Note (Signed)
Emergency Medicine Provider Triage Evaluation Note  Ashante Vanloo , a 47 y.o. male  was evaluated in triage.  Pt complains of fever, tmax 101.4, vomiting, abdominal pain started 10/17. .  Review of Systems  Positive: Fever, AP, vomiting Negative: Diarrhea  Physical Exam  BP (!) 173/107 (BP Location: Right Arm)   Pulse 80   Temp 98.2 F (36.8 C)   Resp 16   Ht 5\' 9"  (1.753 m)   Wt 59.2 kg   SpO2 98%   BMI 19.27 kg/m  Gen:   Awake, no distress   Resp:  Normal effort  MSK:   Moves extremities without difficulty  Other:  Diffuse abdominal tenderness, soft abdomen, nondistended  Medical Decision Making  Medically screening exam initiated at 7:52 PM.  Appropriate orders placed.  Jahlen Leadbeater was informed that the remainder of the evaluation will be completed by another provider, this initial triage assessment does not replace that evaluation, and the importance of remaining in the ED until their evaluation is complete.     Elpidio Anis, PA-C 08/24/23 1954

## 2023-08-24 NOTE — ED Notes (Signed)
PA-C Shari at bedside for MSE screening in triage

## 2023-08-25 ENCOUNTER — Telehealth: Payer: Self-pay

## 2023-08-25 ENCOUNTER — Emergency Department (HOSPITAL_COMMUNITY): Payer: 59

## 2023-08-25 ENCOUNTER — Other Ambulatory Visit: Payer: Self-pay

## 2023-08-25 DIAGNOSIS — R1011 Right upper quadrant pain: Secondary | ICD-10-CM | POA: Diagnosis not present

## 2023-08-25 DIAGNOSIS — K8000 Calculus of gallbladder with acute cholecystitis without obstruction: Secondary | ICD-10-CM | POA: Diagnosis present

## 2023-08-25 DIAGNOSIS — N186 End stage renal disease: Secondary | ICD-10-CM | POA: Diagnosis not present

## 2023-08-25 DIAGNOSIS — K828 Other specified diseases of gallbladder: Secondary | ICD-10-CM | POA: Diagnosis not present

## 2023-08-25 DIAGNOSIS — E119 Type 2 diabetes mellitus without complications: Secondary | ICD-10-CM | POA: Diagnosis not present

## 2023-08-25 DIAGNOSIS — K8012 Calculus of gallbladder with acute and chronic cholecystitis without obstruction: Secondary | ICD-10-CM | POA: Diagnosis not present

## 2023-08-25 DIAGNOSIS — I160 Hypertensive urgency: Secondary | ICD-10-CM | POA: Diagnosis not present

## 2023-08-25 DIAGNOSIS — K8020 Calculus of gallbladder without cholecystitis without obstruction: Secondary | ICD-10-CM | POA: Diagnosis not present

## 2023-08-25 DIAGNOSIS — R109 Unspecified abdominal pain: Secondary | ICD-10-CM | POA: Diagnosis not present

## 2023-08-25 DIAGNOSIS — E876 Hypokalemia: Secondary | ICD-10-CM | POA: Diagnosis present

## 2023-08-25 DIAGNOSIS — N250 Renal osteodystrophy: Secondary | ICD-10-CM | POA: Diagnosis not present

## 2023-08-25 DIAGNOSIS — Z720 Tobacco use: Secondary | ICD-10-CM

## 2023-08-25 DIAGNOSIS — K8010 Calculus of gallbladder with chronic cholecystitis without obstruction: Secondary | ICD-10-CM | POA: Diagnosis present

## 2023-08-25 DIAGNOSIS — K810 Acute cholecystitis: Secondary | ICD-10-CM | POA: Diagnosis not present

## 2023-08-25 DIAGNOSIS — Z992 Dependence on renal dialysis: Secondary | ICD-10-CM | POA: Diagnosis not present

## 2023-08-25 DIAGNOSIS — I120 Hypertensive chronic kidney disease with stage 5 chronic kidney disease or end stage renal disease: Secondary | ICD-10-CM | POA: Diagnosis not present

## 2023-08-25 DIAGNOSIS — D631 Anemia in chronic kidney disease: Secondary | ICD-10-CM | POA: Diagnosis not present

## 2023-08-25 LAB — SURGICAL PCR SCREEN
MRSA, PCR: NEGATIVE
Staphylococcus aureus: NEGATIVE

## 2023-08-25 LAB — RESP PANEL BY RT-PCR (RSV, FLU A&B, COVID)  RVPGX2
Influenza A by PCR: NEGATIVE
Influenza B by PCR: NEGATIVE
Resp Syncytial Virus by PCR: NEGATIVE
SARS Coronavirus 2 by RT PCR: NEGATIVE

## 2023-08-25 LAB — MAGNESIUM: Magnesium: 2.4 mg/dL (ref 1.7–2.4)

## 2023-08-25 LAB — CBG MONITORING, ED
Glucose-Capillary: 59 mg/dL — ABNORMAL LOW (ref 70–99)
Glucose-Capillary: 77 mg/dL (ref 70–99)
Glucose-Capillary: 70 mg/dL (ref 70–99)

## 2023-08-25 LAB — TYPE AND SCREEN
ABO/RH(D): O POS
Antibody Screen: NEGATIVE

## 2023-08-25 LAB — PROTIME-INR
INR: 1.1 (ref 0.8–1.2)
Prothrombin Time: 14.3 s (ref 11.4–15.2)

## 2023-08-25 LAB — GLUCOSE, CAPILLARY
Glucose-Capillary: 100 mg/dL — ABNORMAL HIGH (ref 70–99)
Glucose-Capillary: 74 mg/dL (ref 70–99)

## 2023-08-25 LAB — HEMOGLOBIN A1C
Hgb A1c MFr Bld: 5.4 % (ref 4.8–5.6)
Mean Plasma Glucose: 108.28 mg/dL

## 2023-08-25 MED ORDER — ENOXAPARIN SODIUM 40 MG/0.4ML IJ SOSY: 40.0000 mg | PREFILLED_SYRINGE | INTRAMUSCULAR | Status: DC

## 2023-08-25 MED ORDER — INDOCYANINE GREEN 25 MG IV SOLR
7.5000 mg | Freq: Once | INTRAVENOUS | Status: AC
  Administered 2023-08-26: 7.5 mg via INTRAVENOUS
  Filled 2023-08-25: qty 10

## 2023-08-25 MED ORDER — HYDRALAZINE HCL 20 MG/ML IJ SOLN: 10.0000 mg | INTRAMUSCULAR | Status: DC | PRN

## 2023-08-25 MED ORDER — CHLORHEXIDINE GLUCONATE CLOTH 2 % EX PADS
6.0000 | MEDICATED_PAD | Freq: Once | CUTANEOUS | Status: AC
  Administered 2023-08-25: 6 via TOPICAL

## 2023-08-25 MED ORDER — SUCRALFATE 1 G PO TABS
1.0000 g | ORAL_TABLET | Freq: Once | ORAL | Status: AC
  Administered 2023-08-25: 1 g via ORAL
  Filled 2023-08-25: qty 1

## 2023-08-25 MED ORDER — CARVEDILOL 6.25 MG PO TABS
6.2500 mg | ORAL_TABLET | Freq: Two times a day (BID) | ORAL | Status: DC
  Administered 2023-08-25 – 2023-08-26 (×3): 6.25 mg via ORAL
  Filled 2023-08-25: qty 1
  Filled 2023-08-25: qty 2
  Filled 2023-08-25: qty 1

## 2023-08-25 MED ORDER — NICOTINE 21 MG/24HR TD PT24
21.0000 mg | MEDICATED_PATCH | Freq: Every day | TRANSDERMAL | Status: DC
  Filled 2023-08-25 (×2): qty 1

## 2023-08-25 MED ORDER — ONDANSETRON HCL 4 MG/2ML IJ SOLN
4.0000 mg | Freq: Four times a day (QID) | INTRAMUSCULAR | Status: DC | PRN
  Administered 2023-08-25 – 2023-08-27 (×4): 4 mg via INTRAVENOUS
  Filled 2023-08-25 (×5): qty 2

## 2023-08-25 MED ORDER — CHLORHEXIDINE GLUCONATE CLOTH 2 % EX PADS
6.0000 | MEDICATED_PAD | Freq: Every day | CUTANEOUS | Status: DC
  Administered 2023-08-27: 6 via TOPICAL

## 2023-08-25 MED ORDER — OXYCODONE HCL 5 MG PO TABS
5.0000 mg | ORAL_TABLET | Freq: Once | ORAL | Status: AC
  Administered 2023-08-25: 5 mg via ORAL
  Filled 2023-08-25: qty 1

## 2023-08-25 MED ORDER — SODIUM CHLORIDE 0.9 % IV SOLN
2.0000 g | INTRAVENOUS | Status: DC
  Administered 2023-08-27: 2 g via INTRAVENOUS
  Filled 2023-08-25: qty 20

## 2023-08-25 MED ORDER — CHLORHEXIDINE GLUCONATE CLOTH 2 % EX PADS
6.0000 | MEDICATED_PAD | Freq: Once | CUTANEOUS | Status: AC
  Administered 2023-08-26: 6 via TOPICAL

## 2023-08-25 MED ORDER — HYDROMORPHONE HCL 1 MG/ML IJ SOLN
0.5000 mg | INTRAMUSCULAR | Status: DC | PRN
  Administered 2023-08-25 – 2023-08-26 (×3): 0.5 mg via INTRAVENOUS
  Filled 2023-08-25: qty 1
  Filled 2023-08-25 (×3): qty 0.5

## 2023-08-25 MED ORDER — SODIUM CHLORIDE 0.9 % IV SOLN
2.0000 g | Freq: Once | INTRAVENOUS | Status: AC
  Administered 2023-08-25: 2 g via INTRAVENOUS
  Filled 2023-08-25: qty 20

## 2023-08-25 MED ORDER — HEPARIN SODIUM (PORCINE) 5000 UNIT/ML IJ SOLN
5000.0000 [IU] | Freq: Three times a day (TID) | INTRAMUSCULAR | Status: DC
  Administered 2023-08-25 (×3): 5000 [IU] via SUBCUTANEOUS
  Filled 2023-08-25 (×4): qty 1

## 2023-08-25 MED ORDER — ACETAMINOPHEN 325 MG PO TABS: 650.0000 mg | ORAL_TABLET | Freq: Four times a day (QID) | ORAL | Status: DC | PRN

## 2023-08-25 MED ORDER — ONDANSETRON 4 MG PO TBDP
4.0000 mg | ORAL_TABLET | Freq: Once | ORAL | Status: AC
  Administered 2023-08-25: 4 mg via ORAL
  Filled 2023-08-25: qty 1

## 2023-08-25 MED ORDER — DOXERCALCIFEROL 4 MCG/2ML IV SOLN
1.0000 ug | INTRAVENOUS | Status: DC
  Administered 2023-08-26: 1 ug via INTRAVENOUS
  Filled 2023-08-25: qty 2

## 2023-08-25 MED ORDER — NALOXONE HCL 0.4 MG/ML IJ SOLN: 0.4000 mg | INTRAMUSCULAR | Status: DC | PRN

## 2023-08-25 MED ORDER — AMLODIPINE BESYLATE 10 MG PO TABS
10.0000 mg | ORAL_TABLET | Freq: Every day | ORAL | Status: DC
  Administered 2023-08-25 – 2023-08-27 (×2): 10 mg via ORAL
  Filled 2023-08-25: qty 1
  Filled 2023-08-25: qty 2

## 2023-08-25 MED ORDER — ACETAMINOPHEN 650 MG RE SUPP
650.0000 mg | Freq: Four times a day (QID) | RECTAL | Status: DC | PRN
Start: 2023-08-25 — End: 2023-08-26

## 2023-08-25 MED ORDER — ALBUTEROL SULFATE (2.5 MG/3ML) 0.083% IN NEBU: 2.5000 mg | INHALATION_SOLUTION | RESPIRATORY_TRACT | Status: DC | PRN

## 2023-08-25 MED ORDER — ALUM & MAG HYDROXIDE-SIMETH 200-200-20 MG/5ML PO SUSP
15.0000 mL | Freq: Once | ORAL | Status: AC
  Administered 2023-08-25: 15 mL via ORAL
  Filled 2023-08-25: qty 30

## 2023-08-25 MED ORDER — DEXTROSE 50 % IV SOLN
1.0000 | INTRAVENOUS | Status: DC | PRN
  Filled 2023-08-25 (×2): qty 50

## 2023-08-25 MED ORDER — OXYCODONE HCL 5 MG PO TABS: 5.0000 mg | ORAL_TABLET | Freq: Four times a day (QID) | ORAL | Status: DC | PRN

## 2023-08-25 NOTE — Anesthesia Preprocedure Evaluation (Signed)
Anesthesia Evaluation  Patient identified by MRN, date of birth, ID band Patient awake    Reviewed: Allergy & Precautions, NPO status , Patient's Chart, lab work & pertinent test results, reviewed documented beta blocker date and time   Airway Mallampati: II  TM Distance: >3 FB Neck ROM: Full    Dental  (+) Edentulous Upper, Edentulous Lower, Dental Advisory Given   Pulmonary Current Smoker and Patient abstained from smoking.   Pulmonary exam normal breath sounds clear to auscultation       Cardiovascular hypertension, Pt. on medications and Pt. on home beta blockers Normal cardiovascular exam Rhythm:Regular Rate:Normal  TTE 2023 1. Left ventricular ejection fraction, by estimation, is 55 to 60%. The  left ventricle has normal function. The left ventricle has no regional  wall motion abnormalities. There is severe left ventricular hypertrophy.  Left ventricular diastolic parameters   are consistent with Grade II diastolic dysfunction (pseudonormalization).  Elevated left atrial pressure. The average left ventricular global  longitudinal strain is -18.0 % which is low normal. Pattern does not  appear to be consistent with amyloid,  however, could consider CMR in the future given severe LVH and speckled  pattern on echo.   2. Right ventricular systolic function is mildly reduced. The right  ventricular size is normal. There is severely elevated pulmonary artery  systolic pressure. The estimated right ventricular systolic pressure is  69.8 mmHg.   3. A small pericardial effusion is present. The pericardial effusion is  posterior to the left ventricle.   4. The mitral valve is normal in structure. Trivial mitral valve  regurgitation.   5. Tricuspid valve regurgitation is mild to moderate.   6. The aortic valve is tricuspid. There is mild thickening of the aortic  valve. Aortic valve regurgitation is not visualized. Aortic valve   sclerosis is present, with no evidence of aortic valve stenosis.   7. The inferior vena cava is dilated in size with <50% respiratory  variability, suggesting right atrial pressure of 15 mmHg.     Neuro/Psych  PSYCHIATRIC DISORDERS  Depression    negative neurological ROS     GI/Hepatic negative GI ROS, Neg liver ROS,,,  Endo/Other  diabetes, Type 2    Renal/GU ESRF and DialysisRenal diseaseHD MWF, last dialysis this AM  Lab Results      Component                Value               Date                      NA                       136                 08/26/2023                CL                       92 (L)              08/26/2023                K                        3.4 (L)  08/26/2023                CO2                      27                  08/26/2023                BUN                      22 (H)              08/26/2023                CREATININE               7.60 (H)            08/26/2023                GFRNONAA                 4 (L)               08/26/2023                CALCIUM                  9.2                 08/26/2023                PHOS                     6.0 (H)             08/26/2023                ALBUMIN                  4.1                 08/26/2023                GLUCOSE                  56 (L)              08/26/2023             negative genitourinary   Musculoskeletal negative musculoskeletal ROS (+)    Abdominal   Peds  Hematology negative hematology ROS (+)   Anesthesia Other Findings   Reproductive/Obstetrics                             Anesthesia Physical Anesthesia Plan  ASA: 3  Anesthesia Plan: General   Post-op Pain Management: Tylenol PO (pre-op)*   Induction: Intravenous  PONV Risk Score and Plan: 1 and Midazolam, Dexamethasone and Ondansetron  Airway Management Planned: Oral ETT  Additional Equipment:   Intra-op Plan:   Post-operative Plan: Extubation in OR  Informed  Consent: I have reviewed the patients History and Physical, chart, labs and discussed the procedure including the risks, benefits and alternatives for the proposed anesthesia with the patient or authorized representative who has indicated his/her understanding and acceptance.     Dental advisory given  Plan Discussed with: CRNA  Anesthesia Plan Comments:        Anesthesia Quick Evaluation

## 2023-08-25 NOTE — ED Notes (Signed)
Patient transported to Ultrasound 

## 2023-08-25 NOTE — Telephone Encounter (Signed)
Patient wife called to say he started having fevers on Friday evening after appointment. He was taken to the hosptial and admitted and will be having his gallbladder out. She wanted to let you know.

## 2023-08-25 NOTE — ED Notes (Signed)
Patient refusing nicotine patch.   Renal provider at bedside

## 2023-08-25 NOTE — ED Notes (Signed)
Ultrasound at bedside

## 2023-08-25 NOTE — Care Management CC44 (Signed)
Condition Code 44 Documentation Completed  Patient Details  Name: Levi Hodges MRN: [id number] Date of Birth: 22-Sep-1976   Condition Code 44 given:  Yes Patient signature on Condition Code 44 notice:  Yes Documentation of 2 MD's agreement:  Yes Code 44 added to claim:  Yes    Tom-Johnson, Hershal Coria, RN 08/25/2023, 3:41 PM

## 2023-08-25 NOTE — ED Notes (Signed)
Patient refusing carvedilol.

## 2023-08-25 NOTE — Care Management Obs Status (Signed)
MEDICARE OBSERVATION STATUS NOTIFICATION   Patient Details  Name: Milt Urvina MRN: [id number] Date of Birth: Mar 20, 1976   Medicare Observation Status Notification Given:  Yes    Tom-Johnson, Hershal Coria, RN 08/25/2023, 3:40 PM

## 2023-08-25 NOTE — Plan of Care (Signed)

## 2023-08-25 NOTE — Progress Notes (Signed)
Patient ID: Levi Hodges, male   DOB: 1976/03/21, 48 y.o.   MRN: [id number] Ruq pain and tender on exam, [place] with gallstones. I think clinically has cholecystitis.  Plan for HD in am and likely surgery after that. I think will be difficult given symptoms, esrd, male sex- all predictors for difficult gb.   Discussed surgery with he and family in room I discussed the procedure in detail.  We discussed the risks and benefits of a laparoscopic cholecystectomy and possible cholangiogram including, but not limited to bleeding, infection, injury to surrounding structures such as the intestine or liver, bile leak, retained gallstones, need to convert to an open procedure, prolonged diarrhea, blood clots such as  DVT, common bile duct injury, anesthesia risks, and possible need for additional procedures.  The likelihood of improvement in symptoms and return to the patient's normal status is good. We discussed the typical post-operative recovery course.

## 2023-08-25 NOTE — ED Notes (Signed)
Pt refused EKG stating he needs to eat first.Pt also refused to be placed on monitor. RN aware.

## 2023-08-25 NOTE — Consult Note (Signed)
Reason for Consult:abdominal pain Referring Provider: Rakin Corkran is an 47 y.o. male.  HPI: 47 yo male with 4 day history of abdominal pain. Pain has been constant in the RUQ that radiates throughout the abdomen. He has been nauseated and vomited multiple times. He has not had a similar pain in the past.  He is on dialysis for renal failure related to diabetes.  Past Medical History:  Diagnosis Date   Anemia    Depression    Diabetes mellitus without complication (HCC)    Type II- no meds   Hypertension    Renal disorder     Past Surgical History:  Procedure Laterality Date   AV FISTULA PLACEMENT Left 03/26/2022   Procedure: ARTERIOVENOUS (AV) FISTULA CREATION;  Surgeon: [name], MD;  Location: [facility] OR;  Service: Vascular;  Laterality: Left;   AV FISTULA PLACEMENT Left 05/22/2022   Procedure: FIRSTSTAGE BASILIC ARTERIOVENOUS  FISTULA CREATION;  Surgeon: [name], MD;  Location: [facility] OR;  Service: Vascular;  Laterality: Left;   BASCILIC VEIN TRANSPOSITION Left 08/28/2022   Procedure: LEFT SECOND STAGE BASILIC VEIN TRANSPOSITION;  Surgeon: [name], MD;  Location: [facility] OR;  Service: Vascular;  Laterality: Left;  PERIPHERAL NERVE BLOCK   INSERTION OF DIALYSIS CATHETER Right 03/26/2022   Procedure: INSERTION OF DIALYSIS CATHETER USING PALINDROME PRECISOIN CHRONIC CATHETER KIT (19cm);  Surgeon: [name], MD;  Location: [facility] OR;  Service: Vascular;  Laterality: Right;   TOOTH EXTRACTION N/A 07/23/2022   Procedure: DENTAL RESTORATION/EXTRACTIONS;  Surgeon: [name], DMD;  Location: [facility] OR;  Service: Oral Surgery;  Laterality: N/A;    Family History  Problem Relation Age of Onset   High blood pressure Mother    Diabetes Maternal Grandfather     Social History:  reports that he has been smoking cigarettes. He has a 60 pack-year smoking history. He has been exposed to tobacco smoke. He has never used smokeless tobacco. He reports that he does  not drink alcohol and does not use drugs.  Allergies: No Known Allergies  Medications: I have reviewed the patient's current medications.  Results for orders placed or performed during the hospital encounter of 08/24/23 (from the past 48 hour(s))  Lipase, blood     Status: None   Collection Time: 08/24/23  7:47 PM  Result Value Ref Range   Lipase 39 11 - 51 U/L    Comment: Performed at [facility], [address], [place], [place] [address]  Comprehensive metabolic panel     Status: Abnormal   Collection Time: 08/24/23  7:47 PM  Result Value Ref Range   Sodium 136 135 - 145 mmol/L   Potassium 3.4 (L) 3.5 - 5.1 mmol/L   Chloride 92 (L) 98 - 111 mmol/L   CO2 26 22 - 32 mmol/L   Glucose, Bld 73 70 - 99 mg/dL    Comment: Glucose reference range applies only to samples taken after fasting for at least 8 hours.   BUN 22 (H) 6 - 20 mg/dL   Creatinine, Ser 3.32 (H) 0.61 - 1.24 mg/dL   Calcium 8.9 8.9 - 95.1 mg/dL   Total Protein 8.4 (H) 6.5 - 8.1 g/dL   Albumin 4.4 3.5 - 5.0 g/dL   AST 31 15 - 41 U/L   ALT 22 0 - 44 U/L   Alkaline Phosphatase 52 38 - 126 U/L   Total Bilirubin 1.2 0.3 - 1.2 mg/dL   GFR, Estimated 6 (  L) >60 mL/min    Comment: (NOTE) Calculated using the CKD-EPI Creatinine Equation (2021)    Anion gap 18 (H) 5 - 15    Comment: Performed at [facility], [address], [place], [place] [address]  CBC     Status: None   Collection Time: 08/24/23  7:47 PM  Result Value Ref Range   WBC 5.4 4.0 - 10.5 K/uL   RBC 4.71 4.22 - 5.81 MIL/uL   Hemoglobin 13.7 13.0 - 17.0 g/dL   HCT 91.4 78.2 - 95.6 %   MCV 86.4 80.0 - 100.0 fL   MCH 29.1 26.0 - 34.0 pg   MCHC 33.7 30.0 - 36.0 g/dL   RDW 21.3 08.6 - 57.8 %   Platelets 305 150 - 400 K/uL   nRBC 0.0 0.0 - 0.2 %    Comment: Performed at [facility], [address], [place], [place] [address]  Resp panel by RT-PCR (RSV, Flu A&B, Covid) Anterior Nasal Swab     Status: None   Collection Time: 08/25/23  1:10  AM   Specimen: Anterior Nasal Swab  Result Value Ref Range   SARS Coronavirus 2 by RT PCR NEGATIVE NEGATIVE   Influenza A by PCR NEGATIVE NEGATIVE   Influenza B by PCR NEGATIVE NEGATIVE    Comment: (NOTE) The Xpert Xpress SARS-CoV-2/FLU/RSV plus assay is intended as an aid in the diagnosis of influenza from Nasopharyngeal swab specimens and should not be used as a sole basis for treatment. Nasal washings and aspirates are unacceptable for Xpert Xpress SARS-CoV-2/FLU/RSV testing.  Fact Sheet for Patients: BloggerCourse.com  Fact Sheet for Healthcare Providers: SeriousBroker.it  This test is not yet approved or cleared by the [place] FDA and has been authorized for detection and/or diagnosis of SARS-CoV-2 by FDA under an Emergency Use Authorization (EUA). This EUA will remain in effect (meaning this test can be used) for the duration of the COVID-19 declaration under Section 564(b)(1) of the Act, 21 U.S.C. section 360bbb-3(b)(1), unless the authorization is terminated or revoked.     Resp Syncytial Virus by PCR NEGATIVE NEGATIVE    Comment: (NOTE) Fact Sheet for Patients: BloggerCourse.com  Fact Sheet for Healthcare Providers: SeriousBroker.it  This test is not yet approved or cleared by the [place] FDA and has been authorized for detection and/or diagnosis of SARS-CoV-2 by FDA under an Emergency Use Authorization (EUA). This EUA will remain in effect (meaning this test can be used) for the duration of the COVID-19 declaration under Section 564(b)(1) of the Act, 21 U.S.C. section 360bbb-3(b)(1), unless the authorization is terminated or revoked.  Performed at [facility], [address], [place], [place] [address]   CBG monitoring, ED     Status: Abnormal   Collection Time: 08/25/23  5:24 AM  Result Value Ref Range   Glucose-Capillary 59 (L) 70 - 99  mg/dL    Comment: Glucose reference range applies only to samples taken after fasting for at least 8 hours.    US Abdomen Limited RUQ (LIVER/GB)  Result Date: 08/25/2023 CLINICAL DATA:  Right upper quadrant abdominal pain. EXAM: ULTRASOUND ABDOMEN LIMITED RIGHT UPPER QUADRANT COMPARISON:  08/20/2023. FINDINGS: Gallbladder: Stones are present within the gallbladder and there is mild gallbladder wall thickening at 3.9 mm. Patient reports pain in gallbladder area while scanning per sonographer. Common bile duct: Diameter: 3.1 mm Liver: No focal lesion identified. Within normal limits in parenchymal echogenicity. Portal vein is patent on color Doppler imaging with normal  direction of blood flow towards the liver. Other: None. IMPRESSION: Cholelithiasis with mild gallbladder wall thickening. Correlation with physical exam is recommended to exclude acute cholecystitis. Electronically Signed   By: Thornell Sartorius M.D.   On: 08/25/2023 04:50    ROS  PE Blood pressure (!) 174/98, pulse 94, temperature 98.9 F (37.2 C), temperature source Temporal, resp. rate 18, height 5\' 9"  (1.753 m), weight 59.2 kg, SpO2 100%. Constitutional: NAD; conversant; no deformities Eyes: Moist conjunctiva; no lid lag; anicteric; PERRL Neck: Trachea midline; no thyromegaly Lungs: Normal respiratory effort; no tactile fremitus CV: RRR; no palpable thrills; no pitting edema GI: Abd soft, tender to palpation RUQ; no palpable hepatosplenomegaly MSK: Normal gait; no clubbing/cyanosis Psychiatric: Appropriate affect; alert and oriented x3 Lymphatic: No palpable cervical or axillary lymphadenopathy Skin: No major subcutaneous nodules. Warm and dry   Assessment/Plan: 47 yo male with abdominal pain, gallstones, no LFT elevation, no leukocytosis. My review of US shows borderline wall thickening and dilation -IV abx -NPO -likely lap chole while in the hospital -We discussed the etiology of her pain, we discussed treatment options  and recommended surgery. We discussed details of surgery including general anesthesia, laparoscopic approach, identification of cystic duct and common bile duct. Ligation of cystic duct and cystic artery. Possible need for intraoperative cholangiogram or open procedure. Possible risks of common bile duct injury, liver injury, cystic duct leak, bleeding, infection, post-cholecystectomy syndrome. The patient showed good understanding and all questions were answered   I reviewed last 24 h vitals and pain scores, last 48 h intake and output, last 24 h labs and trends, and last 24 h imaging results.  This care required high  level of medical decision making.   De Blanch Loria Lacina 08/25/2023, 6:48 AM

## 2023-08-25 NOTE — ED Notes (Signed)
ED TO INPATIENT HANDOFF REPORT  ED Nurse Name and Phone #: Grover Canavan [phone]  S Name/Age/Gender Levi Hodges 47 y.o. male Room/Bed: 008C/008C  Code Status   Code Status: Full Code  Home/SNF/Other Home Patient oriented to: self, place, time, and situation Is this baseline? Yes   Triage Complete: Triage complete  Chief Complaint Acute cholecystitis [K81.0] Cholelithiasis [K80.20]  Triage Note Pt arrived POV for continued abd pain, was seen at [facility] 10/16 for flank pain, CT clear, pt is a dialysis pt, had dialysis today, pt reports was not able to pull any fluids off, did complete session. Pt reports n/v and abd pain since last week, reports usually does not produce a lot of urine. HTN noted, otherwise VSS, A&O x4. Zofran 4mg  PTA   Allergies No Known Allergies  Level of Care/Admitting Diagnosis ED Disposition     ED Disposition  Admit   Condition  --   Comment  Hospital Area: [facility] [[id number]]  Level of Care: Telemetry Medical [104]  May admit patient to [facility] or [facility] if equivalent level of care is available:: No  Covid Evaluation: Asymptomatic - no recent exposure (last 10 days) testing not required  Diagnosis: Cholelithiasis [741560]  Admitting Physician: Clydie Braun [[id number]]  Attending Physician: Clydie Braun [[id number]]  Certification:: I certify this patient will need inpatient services for at least 2 midnights          B Medical/Surgery History Past Medical History:  Diagnosis Date   Anemia    Depression    Diabetes mellitus without complication (HCC)    Type II- no meds   Hypertension    Renal disorder    Past Surgical History:  Procedure Laterality Date   AV FISTULA PLACEMENT Left 03/26/2022   Procedure: ARTERIOVENOUS (AV) FISTULA CREATION;  Surgeon: [name], MD;  Location: [facility] OR;  Service: Vascular;  Laterality: Left;   AV FISTULA PLACEMENT Left 05/22/2022   Procedure: FIRSTSTAGE BASILIC ARTERIOVENOUS   FISTULA CREATION;  Surgeon: [name], MD;  Location: [facility] OR;  Service: Vascular;  Laterality: Left;   BASCILIC VEIN TRANSPOSITION Left 08/28/2022   Procedure: LEFT SECOND STAGE BASILIC VEIN TRANSPOSITION;  Surgeon: [name], MD;  Location: [facility] OR;  Service: Vascular;  Laterality: Left;  PERIPHERAL NERVE BLOCK   INSERTION OF DIALYSIS CATHETER Right 03/26/2022   Procedure: INSERTION OF DIALYSIS CATHETER USING PALINDROME PRECISOIN CHRONIC CATHETER KIT (19cm);  Surgeon: [name], MD;  Location: [facility] OR;  Service: Vascular;  Laterality: Right;   TOOTH EXTRACTION N/A 07/23/2022   Procedure: DENTAL RESTORATION/EXTRACTIONS;  Surgeon: [name], DMD;  Location: [facility] OR;  Service: Oral Surgery;  Laterality: N/A;     A IV Location/Drains/Wounds Patient Lines/Drains/Airways Status     Active Line/Drains/Airways     Name Placement date Placement time Site Days   Peripheral IV 08/25/23 22 G Posterior;Right Hand 08/25/23  0600  Hand  less than 1   Fistula / Graft Left Upper arm Arteriovenous fistula 03/26/22  1416  Upper arm  517   Fistula / Graft Left Upper arm Arteriovenous fistula 05/22/22  0758  Upper arm  460   Fistula / Graft Left Upper arm Arteriovenous fistula 08/28/22  0824  Upper arm  362   Hemodialysis Catheter Right Internal jugular Double lumen Permanent (Tunneled) 03/26/22  1330  Internal jugular  517            Intake/Output Last 24 hours  Intake/Output Summary (Last 24 hours)  at 08/25/2023 1303 Last data filed at 08/25/2023 2841 Gross per 24 hour  Intake 100 ml  Output --  Net 100 ml    Labs/Imaging Results for orders placed or performed during the hospital encounter of 08/24/23 (from the past 48 hour(s))  Lipase, blood     Status: None   Collection Time: 08/24/23  7:47 PM  Result Value Ref Range   Lipase 39 11 - 51 U/L    Comment: Performed at [facility], [address], [place], [place] [address]  Comprehensive metabolic panel     Status:  Abnormal   Collection Time: 08/24/23  7:47 PM  Result Value Ref Range   Sodium 136 135 - 145 mmol/L   Potassium 3.4 (L) 3.5 - 5.1 mmol/L   Chloride 92 (L) 98 - 111 mmol/L   CO2 26 22 - 32 mmol/L   Glucose, Bld 73 70 - 99 mg/dL    Comment: Glucose reference range applies only to samples taken after fasting for at least 8 hours.   BUN 22 (H) 6 - 20 mg/dL   Creatinine, Ser 1.02 (H) 0.61 - 1.24 mg/dL   Calcium 8.9 8.9 - 72.5 mg/dL   Total Protein 8.4 (H) 6.5 - 8.1 g/dL   Albumin 4.4 3.5 - 5.0 g/dL   AST 31 15 - 41 U/L   ALT 22 0 - 44 U/L   Alkaline Phosphatase 52 38 - 126 U/L   Total Bilirubin 1.2 0.3 - 1.2 mg/dL   GFR, Estimated 6 (L) >60 mL/min    Comment: (NOTE) Calculated using the CKD-EPI Creatinine Equation (2021)    Anion gap 18 (H) 5 - 15    Comment: Performed at [facility], [address] [place], [place] [address]  CBC     Status: None   Collection Time: 08/24/23  7:47 PM  Result Value Ref Range   WBC 5.4 4.0 - 10.5 K/uL   RBC 4.71 4.22 - 5.81 MIL/uL   Hemoglobin 13.7 13.0 - 17.0 g/dL   HCT 03.4 74.2 - 59.5 %   MCV 86.4 80.0 - 100.0 fL   MCH 29.1 26.0 - 34.0 pg   MCHC 33.7 30.0 - 36.0 g/dL   RDW 63.8 75.6 - 43.3 %   Platelets 305 150 - 400 K/uL   nRBC 0.0 0.0 - 0.2 %    Comment: Performed at [facility], [address] [place], [place] [address]  Resp panel by RT-PCR (RSV, Flu A&B, Covid) Anterior Nasal Swab     Status: None   Collection Time: 08/25/23  1:10 AM   Specimen: Anterior Nasal Swab  Result Value Ref Range   SARS Coronavirus 2 by RT PCR NEGATIVE NEGATIVE   Influenza A by PCR NEGATIVE NEGATIVE   Influenza B by PCR NEGATIVE NEGATIVE    Comment: (NOTE) The Xpert Xpress SARS-CoV-2/FLU/RSV plus assay is intended as an aid in the diagnosis of influenza from Nasopharyngeal swab specimens and should not be used as a sole basis for treatment. Nasal washings and aspirates are unacceptable for Xpert Xpress SARS-CoV-2/FLU/RSV testing.  Fact  Sheet for Patients: BloggerCourse.com  Fact Sheet for Healthcare Providers: SeriousBroker.it  This test is not yet approved or cleared by the [place] FDA and has been authorized for detection and/or diagnosis of SARS-CoV-2 by FDA under an Emergency Use Authorization (EUA). This EUA will remain in effect (meaning this test can be used) for the duration of the COVID-19 declaration under Section 564(b)(1)  of the Act, 21 U.S.C. section 360bbb-3(b)(1), unless the authorization is terminated or revoked.     Resp Syncytial Virus by PCR NEGATIVE NEGATIVE    Comment: (NOTE) Fact Sheet for Patients: BloggerCourse.com  Fact Sheet for Healthcare Providers: SeriousBroker.it  This test is not yet approved or cleared by the [place] FDA and has been authorized for detection and/or diagnosis of SARS-CoV-2 by FDA under an Emergency Use Authorization (EUA). This EUA will remain in effect (meaning this test can be used) for the duration of the COVID-19 declaration under Section 564(b)(1) of the Act, 21 U.S.C. section 360bbb-3(b)(1), unless the authorization is terminated or revoked.  Performed at [facility], [address] [place], [place] [address]   CBG monitoring, ED     Status: Abnormal   Collection Time: 08/25/23  5:24 AM  Result Value Ref Range   Glucose-Capillary 59 (L) 70 - 99 mg/dL    Comment: Glucose reference range applies only to samples taken after fasting for at least 8 hours.  CBG monitoring, ED     Status: None   Collection Time: 08/25/23  8:22 AM  Result Value Ref Range   Glucose-Capillary 77 70 - 99 mg/dL    Comment: Glucose reference range applies only to samples taken after fasting for at least 8 hours.  Type and screen [facility]     Status: None   Collection Time: 08/25/23  8:52 AM  Result Value Ref Range   ABO/RH(D) O POS     Antibody Screen NEG    Sample Expiration      08/28/2023,2359 Performed at [facility], [address], [place], [place] [address]   CBG monitoring, ED     Status: None   Collection Time: 08/25/23 12:31 PM  Result Value Ref Range   Glucose-Capillary 70 70 - 99 mg/dL    Comment: Glucose reference range applies only to samples taken after fasting for at least 8 hours.   US Abdomen Limited RUQ (LIVER/GB)  Result Date: 08/25/2023 CLINICAL DATA:  Right upper quadrant abdominal pain. EXAM: ULTRASOUND ABDOMEN LIMITED RIGHT UPPER QUADRANT COMPARISON:  08/20/2023. FINDINGS: Gallbladder: Stones are present within the gallbladder and there is mild gallbladder wall thickening at 3.9 mm. Patient reports pain in gallbladder area while scanning per sonographer. Common bile duct: Diameter: 3.1 mm Liver: No focal lesion identified. Within normal limits in parenchymal echogenicity. Portal vein is patent on color Doppler imaging with normal direction of blood flow towards the liver. Other: None. IMPRESSION: Cholelithiasis with mild gallbladder wall thickening. Correlation with physical exam is recommended to exclude acute cholecystitis. Electronically Signed   By: Thornell Sartorius M.D.   On: 08/25/2023 04:50    Pending Labs Unresulted Labs (From admission, onward)     Start     Ordered   08/26/23 0500  CBC  Tomorrow morning,   R        08/25/23 0832   08/26/23 0500  Renal function panel  Daily,   R      08/25/23 0832   08/26/23 0500  Comprehensive metabolic panel  Tomorrow morning,   R        08/25/23 0944   08/25/23 1134  Hemoglobin A1c  Add-on,   AD       Comments: To assess prior glycemic control    08/25/23 1134   08/25/23 0607  Magnesium  Add-on,   AD        08/25/23 0606   08/25/23 3762  Protime-INR  Add-on,   AD        08/25/23 0606            Vitals/Pain Today's Vitals   08/25/23 0630 08/25/23 0713 08/25/23 0937 08/25/23 1250  BP: (!) 163/93  (!) 168/90 (!) 161/91  Pulse: 69    75  Resp: 20   20  Temp:  97.6 F (36.4 C)    TempSrc:  Oral    SpO2: 97%   100%  Weight:      Height:      PainSc:        Isolation Precautions No active isolations  Medications Medications  acetaminophen (TYLENOL) tablet 650 mg (has no administration in time range)    Or  acetaminophen (TYLENOL) suppository 650 mg (has no administration in time range)  ondansetron (ZOFRAN) injection 4 mg (has no administration in time range)  naloxone (NARCAN) injection 0.4 mg (has no administration in time range)  HYDROmorphone (DILAUDID) injection 0.5 mg (has no administration in time range)  carvedilol (COREG) tablet 6.25 mg (6.25 mg Oral Not Given 08/25/23 1208)  amLODipine (NORVASC) tablet 10 mg (10 mg Oral Given 08/25/23 0937)  hydrALAZINE (APRESOLINE) injection 10 mg (has no administration in time range)  albuterol (PROVENTIL) (2.5 MG/3ML) 0.083% nebulizer solution 2.5 mg (has no administration in time range)  cefTRIAXone (ROCEPHIN) 2 g in sodium chloride 0.9 % 100 mL IVPB (has no administration in time range)  oxyCODONE (Oxy IR/ROXICODONE) immediate release tablet 5 mg (has no administration in time range)  heparin injection 5,000 Units (5,000 Units Subcutaneous Given 08/25/23 0938)  nicotine (NICODERM CQ - dosed in mg/24 hours) patch 21 mg (21 mg Transdermal Patient Refused/Not Given 08/25/23 1230)  dextrose 50 % solution 50 mL (has no administration in time range)  promethazine (PHENERGAN) tablet 25 mg (25 mg Oral Given 08/24/23 1958)  sucralfate (CARAFATE) tablet 1 g (1 g Oral Given 08/25/23 0104)  ondansetron (ZOFRAN-ODT) disintegrating tablet 4 mg (4 mg Oral Given 08/25/23 0104)  oxyCODONE (Oxy IR/ROXICODONE) immediate release tablet 5 mg (5 mg Oral Given 08/25/23 0104)  alum & mag hydroxide-simeth (MAALOX/MYLANTA) 200-200-20 MG/5ML suspension 15 mL (15 mLs Oral Given 08/25/23 0104)  oxyCODONE (Oxy IR/ROXICODONE) immediate release tablet 5 mg (5 mg Oral Given 08/25/23 0554)   cefTRIAXone (ROCEPHIN) 2 g in sodium chloride 0.9 % 100 mL IVPB (0 g Intravenous Stopped 08/25/23 1610)    Mobility walks     Focused Assessments GI    R Recommendations: See Admitting Provider Note  Report given to:   Additional Notes:

## 2023-08-25 NOTE — Progress Notes (Signed)
  Carryover admission to the Day Admitter.  I discussed this case with the EDP, Dr. Tanda Rockers.  Per these discussions:   This is a 47 year old male with end-stage renal disease on hemodialysis on Monday, Wednesday, Friday schedule, has been admitted with acute cholecystitis after presenting with 3 to 4 days of epigastric/right upper quadrant discomfort, worse with eating, associated nausea/vomiting and inability to tolerate p.o. over the last few days.  Not associate with any jaundice or fever.  Right upper quadrant ultrasound suggestive of acute cholecystitis without evidence of choledocholithiasis.  EDP has discussed patient's case with on-call general surgery, Dr. Sheliah Hatch, who requested [facility] admission, and conveyed that general surgery will formally consult.   Patient has received a dose of Rocephin in the ED.  It is noted that the patient completed his most recent hemodialysis session yesterday, Monday, 08/24/2023.   I have placed an order for inpatient admission to PCU for further evaluation management of the above.  I have placed some additional preliminary admit orders via the adult multi-morbid admission order set. I have also ordered n.p.o., prn Zofran, prn IV Dilaudid.  I have ordered preoperative EKG, INR, type and screen and added on a serum magnesium level.    Newton Pigg, DO Hospitalist

## 2023-08-25 NOTE — ED Provider Notes (Signed)
[place] EMERGENCY DEPARTMENT AT [facility] Provider Note  CSN: [id number] Arrival date & time: 08/24/23 1920  Chief Complaint(s) Abdominal Pain  HPI Levi Hodges is a 47 y.o. male with past medical history as below, significant for ESRD on HD MWF, recurrent epigastric pain w/ n & v, HLD, type II DM who presents to the ED with complaint of epigastric right upper quadrant abdominal pain nausea vomiting.  He was seen 10/17 with flank pain, he had stable imaging and labs that time.  Was somewhat improved at time of discharge.  Patient returns today secondary to epigastric upper quad abdominal pain described as burning, nausea and vomiting.  Postprandial pain.  Difficulty with home medications secondary to nausea and vomiting.  No recent diet or medication changes.  Reported fever earlier today, afebrile on arrival.  Had dialysis earlier today.  He is constipated intermittently.  Only produces very little urine but denies any hematuria or dysuria.  No chest pain or dyspnea.  No sick contacts or recent travel  Past Medical History Past Medical History:  Diagnosis Date   Anemia    Depression    Diabetes mellitus without complication (HCC)    Type II- no meds   Hypertension    Renal disorder    Patient Active Problem List   Diagnosis Date Noted   Acute cholecystitis 08/25/2023   Epigastric pain 08/21/2023   Nausea and vomiting 08/21/2023   Retinopathy 10/07/2022   Numbness in both legs 04/03/2022   ESRD (end stage renal disease) (HCC) 03/24/2022   Normocytic anemia 03/24/2022   Swelling of lower extremity 03/24/2022   Pulmonary nodule 03/24/2022   Well controlled type 2 diabetes mellitus (HCC) 03/21/2019   Low back pain 03/11/2019   Patient's intentional underdosing of medication regimen due to financial hardship 08/27/2018   Hyperlipidemia associated with type 2 diabetes mellitus (HCC) 01/05/2017   Essential hypertension 01/05/2017   Tobacco abuse 01/05/2017   ED  (erectile dysfunction) 01/05/2017   Home Medication(s) Prior to Admission medications   Medication Sig Start Date End Date Taking? Authorizing Provider  amLODipine (NORVASC) 10 MG tablet TAKE 1 TABLET(10 MG) BY MOUTH DAILY 01/29/23   [name], MD  AURYXIA 1 GM 210 MG(Fe) tablet Take 420 mg by mouth 3 (three) times daily. 07/21/23   [provider]  B Complex-C-Zn-Folic Acid (DIALYVITE 800 WITH ZINC) 0.8 MG TABS Take 1 tablet by mouth daily. Patient not taking: Reported on 08/20/2023 06/15/23   [provider]  blood glucose meter kit and supplies Dispense based on patient and insurance preference. Use up to four times daily as directed. (FOR ICD-10 E10.9, E11.9). 07/14/22   [name], MD  Blood Glucose Monitoring Suppl (ACCU-CHEK AVIVA PLUS) w/Device KIT Use up to 4 times a day 07/28/22   [name], MD  carvedilol (COREG) 6.25 MG tablet Take 1-2 tablets (6.25-12.5 mg total) by mouth 2 (two) times daily with a meal. Patient taking differently: Take 6.25-12.5 mg by mouth See admin instructions. 12.5 mg in the morning, 6.25 mg in the evening 07/04/22   [name], MD  gabapentin (NEURONTIN) 100 MG capsule Take 3 capsules (300 mg total) by mouth 2 (two) times daily. 10/09/22   [name], MD  glucose blood (ACCU-CHEK GUIDE) test strip USE AS DIRECTED UP TO FOUR TIMES DAILY 08/12/23   [name], MD  Insulin Pen Needle (PEN NEEDLES) 31G X 5 MM MISC 1 Package by Does not apply route daily.  04/15/19   [name], [name], MD  Lancets (ACCU-CHEK MULTICLIX) lancets Use as instructed up to 4 times a day 07/28/22   [name], MD  metoCLOPramide (REGLAN) 5 MG tablet Take 5 mg by mouth 2 (two) times daily. 08/07/23   [provider]  Multiple Vitamin (DAILY-VITE MULTIVITAMIN) TABS Take 1 tablet by mouth daily with supper. 06/04/22   [provider]  ondansetron (ZOFRAN) 4 MG tablet Take 1 tablet (4 mg  total) by mouth every 8 (eight) hours as needed for nausea or vomiting. 08/21/23   [name], MD  pantoprazole (PROTONIX) 40 MG tablet Take 1 tablet (40 mg total) by mouth daily. 08/21/23   [name], MD  sevelamer carbonate (RENVELA) 800 MG tablet Take 1 tablet (800 mg total) by mouth 3 (three) times daily with meals. 05/21/22   [name], MD  simvastatin (ZOCOR) 20 MG tablet TAKE 1 TABLET(20 MG) BY MOUTH AT BEDTIME 04/25/22   [name], MD                                                                                                                                    Past Surgical History Past Surgical History:  Procedure Laterality Date   AV FISTULA PLACEMENT Left 03/26/2022   Procedure: ARTERIOVENOUS (AV) FISTULA CREATION;  Surgeon: [name], MD;  [facility];  Service: Vascular;  Laterality: Left;   AV FISTULA PLACEMENT Left 05/22/2022   Procedure: FIRSTSTAGE BASILIC ARTERIOVENOUS  FISTULA CREATION;  Surgeon: [name], MD;  Location: [facility] OR;  Service: Vascular;  Laterality: Left;   BASCILIC VEIN TRANSPOSITION Left 08/28/2022   Procedure: LEFT SECOND STAGE BASILIC VEIN TRANSPOSITION;  Surgeon: [name], MD;  Location: [facility] OR;  Service: Vascular;  Laterality: Left;  PERIPHERAL NERVE BLOCK   INSERTION OF DIALYSIS CATHETER Right 03/26/2022   Procedure: INSERTION OF DIALYSIS CATHETER USING PALINDROME PRECISOIN CHRONIC CATHETER KIT (19cm);  Surgeon: [name], MD;  Location: [facility] OR;  Service: Vascular;  Laterality: Right;   TOOTH EXTRACTION N/A 07/23/2022   Procedure: DENTAL RESTORATION/EXTRACTIONS;  Surgeon: [name], DMD;  Location: [facility] OR;  Service: Oral Surgery;  Laterality: N/A;   Family History Family History  Problem Relation Age of Onset   High blood pressure Mother    Diabetes Maternal Grandfather     Social History Social History   Tobacco Use   Smoking status: Every Day    Current packs/day: 2.00     Average packs/day: 2.0 packs/day for 30.0 years (60.0 ttl pk-yrs)    Types: Cigarettes    Passive exposure: Current   Smokeless tobacco: Never   Tobacco comments:    2 packs a day  Vaping Use   Vaping status: Never Used  Substance Use Topics   Alcohol use: No   Drug use: No   Allergies Patient has no known allergies.  Review of Systems  Review of Systems  Constitutional:  Positive for fever. Negative for chills.  Respiratory:  Negative for chest tightness and shortness of breath.   Cardiovascular:  Negative for chest pain and palpitations.  Gastrointestinal:  Positive for abdominal pain, constipation, nausea and vomiting. Negative for diarrhea.  Genitourinary:  Negative for dysuria and hematuria.  Skin:  Negative for wound.  Neurological:  Negative for headaches.  All other systems reviewed and are negative.   Physical Exam Vital Signs  I have reviewed the triage vital signs BP (!) 174/98   Pulse 94   Temp 98.9 F (37.2 C) (Temporal)   Resp 18   Ht 5\' 9"  (1.753 m)   Wt 59.2 kg   SpO2 100%   BMI 19.27 kg/m  Physical Exam Vitals and nursing note reviewed.  Constitutional:      General: He is not in acute distress.    Appearance: He is well-developed.  HENT:     Head: Normocephalic and atraumatic.     Right Ear: External ear normal.     Left Ear: External ear normal.     Mouth/Throat:     Mouth: Mucous membranes are moist.  Eyes:     General: No scleral icterus. Cardiovascular:     Rate and Rhythm: Normal rate and regular rhythm.     Pulses: Normal pulses.     Heart sounds: Normal heart sounds.  Pulmonary:     Effort: Pulmonary effort is normal. No respiratory distress.     Breath sounds: Normal breath sounds.  Abdominal:     General: Abdomen is flat.     Palpations: Abdomen is soft.     Tenderness: There is abdominal tenderness in the right upper quadrant and epigastric area. Positive signs include Murphy's sign. Negative signs include Rovsing's sign.   Musculoskeletal:     Cervical back: No rigidity.     Right lower leg: No edema.     Left lower leg: No edema.  Skin:    General: Skin is warm and dry.     Capillary Refill: Capillary refill takes less than 2 seconds.       Neurological:     Mental Status: He is alert.  Psychiatric:        Mood and Affect: Mood normal.        Behavior: Behavior normal.     ED Results and Treatments Labs (all labs ordered are listed, but only abnormal results are displayed) Labs Reviewed  COMPREHENSIVE METABOLIC PANEL - Abnormal; Notable for the following components:      Result Value   Potassium 3.4 (*)    Chloride 92 (*)    BUN 22 (*)    Creatinine, Ser 9.36 (*)    Total Protein 8.4 (*)    GFR, Estimated 6 (*)    Anion gap 18 (*)    All other components within normal limits  CBG MONITORING, ED - Abnormal; Notable for the following components:   Glucose-Capillary 59 (*)    All other components within normal limits  RESP PANEL BY RT-PCR (RSV, FLU A&B, COVID)  RVPGX2  LIPASE, BLOOD  CBC  Radiology US Abdomen Limited RUQ (LIVER/GB)  Result Date: 08/25/2023 CLINICAL DATA:  Right upper quadrant abdominal pain. EXAM: ULTRASOUND ABDOMEN LIMITED RIGHT UPPER QUADRANT COMPARISON:  08/20/2023. FINDINGS: Gallbladder: Stones are present within the gallbladder and there is mild gallbladder wall thickening at 3.9 mm. Patient reports pain in gallbladder area while scanning per sonographer. Common bile duct: Diameter: 3.1 mm Liver: No focal lesion identified. Within normal limits in parenchymal echogenicity. Portal vein is patent on color Doppler imaging with normal direction of blood flow towards the liver. Other: None. IMPRESSION: Cholelithiasis with mild gallbladder wall thickening. Correlation with physical exam is recommended to exclude acute cholecystitis. Electronically Signed    By: Thornell Sartorius M.D.   On: 08/25/2023 04:50    Pertinent labs & imaging results that were available during my care of the patient were reviewed by me and considered in my medical decision making (see MDM for details).  Medications Ordered in ED Medications  cefTRIAXone (ROCEPHIN) 2 g in sodium chloride 0.9 % 100 mL IVPB (has no administration in time range)  promethazine (PHENERGAN) tablet 25 mg (25 mg Oral Given 08/24/23 1958)  sucralfate (CARAFATE) tablet 1 g (1 g Oral Given 08/25/23 0104)  ondansetron (ZOFRAN-ODT) disintegrating tablet 4 mg (4 mg Oral Given 08/25/23 0104)  oxyCODONE (Oxy IR/ROXICODONE) immediate release tablet 5 mg (5 mg Oral Given 08/25/23 0104)  alum & mag hydroxide-simeth (MAALOX/MYLANTA) 200-200-20 MG/5ML suspension 15 mL (15 mLs Oral Given 08/25/23 0104)  oxyCODONE (Oxy IR/ROXICODONE) immediate release tablet 5 mg (5 mg Oral Given 08/25/23 0554)                                                                                                                                     Procedures Procedures  (including critical care time)  Medical Decision Making / ED Course    Medical Decision Making:    Anne Maneval is a 47 y.o. male with past medical history as below, significant for ESRD on HD MWF, recurrent epigastric pain w/ n & v, HLD, type II DM who presents to the ED with complaint of epigastric right upper quadrant abdominal pain nausea vomiting.. The complaint involves an extensive differential diagnosis and also carries with it a high risk of complications and morbidity.  Serious etiology was considered. Ddx includes but is not limited to: Differential diagnosis includes but is not exclusive to acute cholecystitis, intrathoracic causes for epigastric abdominal pain, gastritis, duodenitis, pancreatitis, small bowel or large bowel obstruction, abdominal aortic aneurysm, hernia, gastritis, etc.   Complete initial physical exam performed, notably the patient   was no acute distress, nausea has resolved with Phenergan.    Reviewed and confirmed nursing documentation for past medical history, family history, social history.  Vital signs reviewed.    Clinical Course as of 08/25/23 0602  Tue Aug 25, 2023  0529 Spoke w/ Dr Sheliah Hatch, will see in the morning, recommend admit hospitalist  [SG]  Clinical Course User Index [SG] Sloan Leiter, DO     Patient here with recurrent abdominal pain, nausea and vomiting.  Compliant with dialysis  Screening labs stable  Will get right upper quadrant ultrasound as pain primarily right upper quadrant epigastrium as location of pain  Positive Murphy sign noted on exam.  Right upper quadrant ultrasound with cholelithiasis and gallbladder wall thickening.  Concern for possible cholecystitis.  His labs are stable, c/w ESRD, will discuss with general surgery.  Patient given dose of Rocephin for his cholecystitis.  Labs are stable, he is afebrile, not jaundiced, not septic.  Will admit under medical service, general surgery on consult for cholecystitis  Admitted [facility]          Additional history obtained: -Additional history obtained from spouse -External records from outside source obtained and reviewed including: Chart review including previous notes, labs, imaging, consultation notes including  Recent ED visit Prior labs/imaging   Lab Tests: -I ordered, reviewed, and interpreted labs.   The pertinent results include:   Labs Reviewed  COMPREHENSIVE METABOLIC PANEL - Abnormal; Notable for the following components:      Result Value   Potassium 3.4 (*)    Chloride 92 (*)    BUN 22 (*)    Creatinine, Ser 9.36 (*)    Total Protein 8.4 (*)    GFR, Estimated 6 (*)    Anion gap 18 (*)    All other components within normal limits  CBG MONITORING, ED - Abnormal; Notable for the following components:   Glucose-Capillary 59 (*)    All other components within normal limits  RESP PANEL BY RT-PCR  (RSV, FLU A&B, COVID)  RVPGX2  LIPASE, BLOOD  CBC    Notable for labs stable  EKG   EKG Interpretation Date/Time:    Ventricular Rate:    PR Interval:    QRS Duration:    QT Interval:    QTC Calculation:   R Axis:      Text Interpretation:           Imaging Studies ordered: I ordered imaging studies including RUQ [place] I independently visualized the following imaging with scope of interpretation limited to determining acute life threatening conditions related to emergency care; findings noted above I independently visualized and interpreted imaging. I agree with the radiologist interpretation   Medicines ordered and prescription drug management: Meds ordered this encounter  Medications   DISCONTD: ondansetron (ZOFRAN-ODT) disintegrating tablet 4 mg   promethazine (PHENERGAN) tablet 25 mg   sucralfate (CARAFATE) tablet 1 g   ondansetron (ZOFRAN-ODT) disintegrating tablet 4 mg   oxyCODONE (Oxy IR/ROXICODONE) immediate release tablet 5 mg   alum & mag hydroxide-simeth (MAALOX/MYLANTA) 200-200-20 MG/5ML suspension 15 mL   oxyCODONE (Oxy IR/ROXICODONE) immediate release tablet 5 mg   cefTRIAXone (ROCEPHIN) 2 g in sodium chloride 0.9 % 100 mL IVPB    Order Specific Question:   Antibiotic Indication:    Answer:   Intra-abdominal    -I have reviewed the patients home medicines and have made adjustments as needed   Consultations Obtained: I requested consultation with the gen surg K[facility]inger,  and discussed lab and imaging findings as well as pertinent plan - they recommend: med admit   Cardiac Monitoring: The patient was maintained on a cardiac monitor.  I personally viewed and interpreted the cardiac monitored which showed an underlying rhythm of: NSR Continuous pulse oximetry interpreted by myself, 100% on RA.    Social Determinants of Health:  Diagnosis or  treatment significantly limited by social determinants of health: current smoker Counseled patient for  approximately 3 minutes regarding smoking cessation. Discussed risks of smoking and how they applied and affected their visit here today.  CPT code: 09811: intermediate counseling for smoking cessation     Reevaluation: After the interventions noted above, I reevaluated the patient and found that they have improved  Co morbidities that complicate the patient evaluation  Past Medical History:  Diagnosis Date   Anemia    Depression    Diabetes mellitus without complication (HCC)    Type II- no meds   Hypertension    Renal disorder       Dispostion: Disposition decision including need for hospitalization was considered, and patient admitted to the hospital.    Final Clinical Impression(s) / ED Diagnoses Final diagnoses:  ESRD on hemodialysis (HCC)  Cholecystitis  Nausea and vomiting, unspecified vomiting type        Sloan Leiter, DO 08/25/23 0603

## 2023-08-25 NOTE — H&P (Addendum)
History and Physical    Patient: Levi Hodges [name]:[id number] DOB: 08/26/1976 DOA: 08/24/2023 DOS: the patient was seen and examined on 08/25/2023 PCP: [name], MD  Patient coming from: Home  Chief Complaint:  Chief Complaint  Patient presents with   Abdominal Pain   HPI: Levi Hodges is a 47 y.o. male with medical history significant of hypertension, diabetes mellitus type 2, end-stage renal disease on dialysis\, depression, and anemia who presented with complaints of 5 day history of right upper quadrant abdominal pain.  Pain is sharp, constant, and radiates throughout his abdomen.  He has had associated symptoms of nausea and vomiting.  Emesis has been nonbloody in appearance.  Reports poor p.o. intake due to symptoms.  Denies having anything like this previously in the past.  He notes that he last dialyzed yesterday.  Denies having any significant fever, chest pain, shortness of breath, leg swelling, or recent sick contacts to his knowledge.  In the emergency department patient was noted to be afebrile with blood pressures elevated up to 191/104, and all other vital signs maintained.  Labs noted CBC within normal limits, potassium 3.4, BUN 22, creatinine 9.36, anion gap 18, and LFTs within normal limits.  Influenza, COVID-19, and RSV screening were negative.  Right upper quadrant ultrasound noted cholelithiasis with mild gallbladder wall thickening.  Patient had been given antiemetics, Rocephin, and pain medication.  General surgery have been formally consulted and recommended keeping patient NPO.  Review of Systems: As mentioned in the history of present illness. All other systems reviewed and are negative. Past Medical History:  Diagnosis Date   Anemia    Depression    Diabetes mellitus without complication (HCC)    Type II- no meds   Hypertension    Renal disorder    Past Surgical History:  Procedure Laterality Date   AV FISTULA PLACEMENT Left 03/26/2022    Procedure: ARTERIOVENOUS (AV) FISTULA CREATION;  Surgeon: [name], MD;  Location: [facility] OR;  Service: Vascular;  Laterality: Left;   AV FISTULA PLACEMENT Left 05/22/2022   Procedure: FIRSTSTAGE BASILIC ARTERIOVENOUS  FISTULA CREATION;  Surgeon: [name], MD;  Location: [facility] OR;  Service: Vascular;  Laterality: Left;   BASCILIC VEIN TRANSPOSITION Left 08/28/2022   Procedure: LEFT SECOND STAGE BASILIC VEIN TRANSPOSITION;  Surgeon: [name], MD;  Location: [facility] OR;  Service: Vascular;  Laterality: Left;  PERIPHERAL NERVE BLOCK   INSERTION OF DIALYSIS CATHETER Right 03/26/2022   Procedure: INSERTION OF DIALYSIS CATHETER USING PALINDROME PRECISOIN CHRONIC CATHETER KIT (19cm);  Surgeon: [name], MD;  Location: [facility] OR;  Service: Vascular;  Laterality: Right;   TOOTH EXTRACTION N/A 07/23/2022   Procedure: DENTAL RESTORATION/EXTRACTIONS;  Surgeon: [name], DMD;  Location: [facility] OR;  Service: Oral Surgery;  Laterality: N/A;   Social History:  reports that he has been smoking cigarettes. He has a 60 pack-year smoking history. He has been exposed to tobacco smoke. He has never used smokeless tobacco. He reports that he does not drink alcohol and does not use drugs.  No Known Allergies  Family History  Problem Relation Age of Onset   High blood pressure Mother    Diabetes Maternal Grandfather     Prior to Admission medications   Medication Sig Start Date End Date Taking? Authorizing Provider  amLODipine (NORVASC) 10 MG tablet TAKE 1 TABLET(10 MG) BY MOUTH DAILY 01/29/23   [name], MD  AURYXIA 1 GM 210 MG(Fe) tablet Take 420 mg by mouth 3 (  three) times daily. 07/21/23   [provider]  B Complex-C-Zn-Folic Acid (DIALYVITE 800 WITH ZINC) 0.8 MG TABS Take 1 tablet by mouth daily. Patient not taking: Reported on 08/20/2023 06/15/23   [provider]  carvedilol (COREG) 6.25 MG tablet Take 1-2 tablets (6.25-12.5 mg total) by mouth 2 (two) times daily  with a meal. Patient taking differently: Take 6.25-12.5 mg by mouth See admin instructions. 12.5 mg in the morning, 6.25 mg in the evening 07/04/22   [name], MD  gabapentin (NEURONTIN) 100 MG capsule Take 3 capsules (300 mg total) by mouth 2 (two) times daily. 10/09/22   [name], MD  Insulin Pen Needle (PEN NEEDLES) 31G X 5 MM MISC 1 Package by Does not apply route daily. 04/15/19   [name], [name], MD  Lancets (ACCU-CHEK MULTICLIX) lancets Use as instructed up to 4 times a day 07/28/22   [name], MD  metoCLOPramide (REGLAN) 5 MG tablet Take 5 mg by mouth 2 (two) times daily. 08/07/23   [provider]  Multiple Vitamin (DAILY-VITE MULTIVITAMIN) TABS Take 1 tablet by mouth daily with supper. 06/04/22   [provider]  ondansetron (ZOFRAN) 4 MG tablet Take 1 tablet (4 mg total) by mouth every 8 (eight) hours as needed for nausea or vomiting. 08/21/23   [name], MD  pantoprazole (PROTONIX) 40 MG tablet Take 1 tablet (40 mg total) by mouth daily. 08/21/23   [name], MD  sevelamer carbonate (RENVELA) 800 MG tablet Take 1 tablet (800 mg total) by mouth 3 (three) times daily with meals. 05/21/22   [name], MD  simvastatin (ZOCOR) 20 MG tablet TAKE 1 TABLET(20 MG) BY MOUTH AT BEDTIME 04/25/22   [name], MD    Physical Exam: Vitals:   08/25/23 0500 08/25/23 0530 08/25/23 0630 08/25/23 0713  BP: (!) 159/85 (!) 174/98 (!) 163/93   Pulse: 78 94 69   Resp: 18  20   Temp:    97.6 F (36.4 C)  TempSrc:    Oral  SpO2: 100% 100% 97%   Weight:      Height:       Constitutional: Middle-aged male currently in no acute distress Eyes: PERRL, lids and conjunctivae normal ENMT: Mucous membranes are moist.   Neck: normal, supple, no masses, no thyromegaly Respiratory: clear to auscultation bilaterally, no wheezing, no crackles. Normal respiratory effort. No accessory muscle use.  Cardiovascular:  Regular rate and rhythm, no murmurs / rubs / gallops. No extremity edema.  Left upper extremity fistula with palpable thrill. Abdomen: Right upper quadrant tenderness to palpation.  Bowel sounds present. Musculoskeletal: no clubbing / cyanosis. No joint deformity upper and lower extremities. Good ROM, no contractures. Normal muscle tone.  Skin: no rashes, lesions, ulcers. No induration Neurologic: CN 2-12 grossly intact.  Strength 5/5 in all 4.  Psychiatric: Normal judgment and insight. Alert and oriented x 3. Normal mood.   Data Reviewed:  Reviewed labs, imaging, and pertinent records as documented  Assessment and Plan:  Cholelithiasis with acute cholecystitis Acute.  Patient with a 5-day history of abdominal pain with nausea and vomiting.  Right upper quadrant ultrasound significant for gallstones with mild gallbladder wall thickening.  Gallbladder duct and LFTs were within normal limits.  Lower suspicion for choledocholithiasis.  Patient had been given antiemetics, pain medication, and Rocephin.  General surgery was consulted to evaluate and recommended keeping patient n.p.o after midnight for cholecystectomy on 10/23. -Admit to a medical  telemetry bed -Clear liquid diet and n.p.o. after midnight -Rocephin IV -Zofran as needed for nausea/vomiting -Oxycodone/Dilaudid IV as needed for moderate to severe pain respectively -Appreciate general surgery consultative services we will follow-up for any further recommendations  Hypertensive urgency Acute.  On admission blood pressures initially elevated up to 191/104. -Continue Coreg and amlodipine -Hydralazine IV as needed for elevated blood pressures  ESRD on hemodialysis Patient is on a Monday, Wednesday, Friday hemodialysis schedule.  Reports last dialyzing on 10/21.  Labs noted potassium 3.4, BUN 22, and creatinine 9.36 -Nephrology consulted for likely need of dialysis  Hypokalemia Acute.  Initial potassium noted to be 3.4.  Patient was  given 20 meq equivalents of potassium chloride p.o. -Continue to monitor and replace as needed  Controlled diabetes mellitus type 2, without long-term use of insulin On admission glucose noted to be 73.  Last available hemoglobin A1c was 5.4 in 2023. -Hypoglycemic protocols -CBGs before every meal due to initial low normal blood sugar -Amp of D50 as needed  Tobacco abuse -Nicotine patch offered  DVT prophylaxis: Heparin Advance Care Planning:   Code Status: Full Code    Consults: General Surgery  Family Communication: None requested  Severity of Illness: The appropriate patient status for this patient is observation Author: Clydie Braun, MD 08/25/2023 8:21 AM  For on call review [email or web address].

## 2023-08-25 NOTE — Consult Note (Addendum)
[facility] Renal Consultation Note  Indication for Consultation:  Management of ESRD/hemodialysis; anemia, hypertension/volume and secondary hyperparathyroidism  HPI: Levi Hodges is a 47 y.o. male with ESRD(chronic HD MWF compliant) and further history as below presented to the ER today with recurrent abdominal pain and admitted with cholelithiasis with acute cholecystitis.  LFTs okay, potassium 3.4, WBC 5.4, Hgb 13.7.  Seen by surgical team.  Noted hypertensive urgency in ER initially 191/104 IV hydralazine as needed given also Coreg and amlodipine continued.  Outpatient BP not as elevated probably related to abdominal discomfort.  Last dialysis yesterday on schedule .  Leaving below EDW at last 2 treatments secondary to decreased p.o. intake.  Currently tolerating liquids with abdominal pain controlled currently.  Denies fevers chills shortness of breath.      Past Medical History:  Diagnosis Date   Anemia    Depression    Diabetes mellitus without complication (HCC)    Type II- no meds   Hypertension    Renal disorder     Past Surgical History:  Procedure Laterality Date   AV FISTULA PLACEMENT Left 03/26/2022   Procedure: ARTERIOVENOUS (AV) FISTULA CREATION;  Surgeon: [name], MD;  Location: [facility] OR;  Service: Vascular;  Laterality: Left;   AV FISTULA PLACEMENT Left 05/22/2022   Procedure: FIRSTSTAGE BASILIC ARTERIOVENOUS  FISTULA CREATION;  Surgeon: [name], MD;  Location: [facility] OR;  Service: Vascular;  Laterality: Left;   BASCILIC VEIN TRANSPOSITION Left 08/28/2022   Procedure: LEFT SECOND STAGE BASILIC VEIN TRANSPOSITION;  Surgeon: [name], MD;  Location: [facility] OR;  Service: Vascular;  Laterality: Left;  PERIPHERAL NERVE BLOCK   INSERTION OF DIALYSIS CATHETER Right 03/26/2022   Procedure: INSERTION OF DIALYSIS CATHETER USING PALINDROME PRECISOIN CHRONIC CATHETER KIT (19cm);  Surgeon: [name], MD;  Location: [facility] OR;  Service: Vascular;   Laterality: Right;   TOOTH EXTRACTION N/A 07/23/2022   Procedure: DENTAL RESTORATION/EXTRACTIONS;  Surgeon: [name], DMD;  Location: [facility] OR;  Service: Oral Surgery;  Laterality: N/A;      Family History  Problem Relation Age of Onset   High blood pressure Mother    Diabetes Maternal Grandfather       reports that he has been smoking cigarettes. He has a 60 pack-year smoking history. He has been exposed to tobacco smoke. He has never used smokeless tobacco. He reports that he does not drink alcohol and does not use drugs.  No Known Allergies  Prior to Admission medications   Medication Sig Start Date End Date Taking? Authorizing Provider  amLODipine (NORVASC) 10 MG tablet TAKE 1 TABLET(10 MG) BY MOUTH DAILY Patient taking differently: Take 10 mg by mouth in the morning and at bedtime. 01/29/23  Yes [name], MD  AURYXIA 1 GM 210 MG(Fe) tablet Take 420 mg by mouth 3 (three) times daily. 07/21/23  Yes [provider]  carvedilol (COREG) 6.25 MG tablet Take 1-2 tablets (6.25-12.5 mg total) by mouth 2 (two) times daily with a meal. Patient taking differently: Take 6.25 mg by mouth 2 (two) times daily with a meal. 07/04/22  Yes [name], MD  gabapentin (NEURONTIN) 100 MG capsule Take 3 capsules (300 mg total) by mouth 2 (two) times daily. Patient taking differently: Take 300 mg by mouth at bedtime. 10/09/22  Yes [name], MD  ondansetron (ZOFRAN) 4 MG tablet Take 1 tablet (4 mg total) by mouth every 8 (eight) hours as needed for nausea or vomiting. Patient taking  differently: Take 4 mg by mouth as needed for nausea or vomiting. 08/21/23  Yes [name], MD  pantoprazole (PROTONIX) 40 MG tablet Take 1 tablet (40 mg total) by mouth daily. 08/21/23  Yes [name], MD  simvastatin (ZOCOR) 20 MG tablet TAKE 1 TABLET(20 MG) BY MOUTH AT BEDTIME 04/25/22  Yes [name], MD  Insulin Pen Needle (PEN NEEDLES) 31G X 5 MM MISC 1  Package by Does not apply route daily. 04/15/19   [name], [name], MD  Lancets (ACCU-CHEK MULTICLIX) lancets Use as instructed up to 4 times a day 07/28/22   [name], MD  metoCLOPramide (REGLAN) 5 MG tablet Take 5 mg by mouth 2 (two) times daily. Patient not taking: Reported on 08/25/2023 08/07/23   [provider]  sevelamer carbonate (RENVELA) 800 MG tablet Take 1 tablet (800 mg total) by mouth 3 (three) times daily with meals. Patient not taking: Reported on 08/25/2023 05/21/22   [name], MD    I have reviewed the patient's current medications.  Results for orders placed or performed during the hospital encounter of 08/24/23 (from the past 48 hour(s))  Lipase, blood     Status: None   Collection Time: 08/24/23  7:47 PM  Result Value Ref Range   Lipase 39 11 - 51 U/L    Comment: Performed at [facility], [address] [place], [place] [address]  Comprehensive metabolic panel     Status: Abnormal   Collection Time: 08/24/23  7:47 PM  Result Value Ref Range   Sodium 136 135 - 145 mmol/L   Potassium 3.4 (L) 3.5 - 5.1 mmol/L   Chloride 92 (L) 98 - 111 mmol/L   CO2 26 22 - 32 mmol/L   Glucose, Bld 73 70 - 99 mg/dL    Comment: Glucose reference range applies only to samples taken after fasting for at least 8 hours.   BUN 22 (H) 6 - 20 mg/dL   Creatinine, Ser 8.41 (H) 0.61 - 1.24 mg/dL   Calcium 8.9 8.9 - 32.4 mg/dL   Total Protein 8.4 (H) 6.5 - 8.1 g/dL   Albumin 4.4 3.5 - 5.0 g/dL   AST 31 15 - 41 U/L   ALT 22 0 - 44 U/L   Alkaline Phosphatase 52 38 - 126 U/L   Total Bilirubin 1.2 0.3 - 1.2 mg/dL   GFR, Estimated 6 (L) >60 mL/min    Comment: (NOTE) Calculated using the CKD-EPI Creatinine Equation (2021)    Anion gap 18 (H) 5 - 15    Comment: Performed at [facility], [address], [place], [place] [address]  CBC     Status: None   Collection Time: 08/24/23  7:47 PM  Result Value Ref Range   WBC 5.4 4.0 - 10.5 K/uL    RBC 4.71 4.22 - 5.81 MIL/uL   Hemoglobin 13.7 13.0 - 17.0 g/dL   HCT 72.5 36.6 - 44.0 %   MCV 86.4 80.0 - 100.0 fL   MCH 29.1 26.0 - 34.0 pg   MCHC 33.7 30.0 - 36.0 g/dL   RDW 34.7 42.5 - 95.6 %   Platelets 305 150 - 400 K/uL   nRBC 0.0 0.0 - 0.2 %    Comment: Performed at [facility], [address], [place], [place] [address]  Resp panel by RT-PCR (RSV, Flu A&B, Covid) Anterior Nasal Swab     Status: None   Collection Time: 08/25/23  1:10 AM   Specimen: Anterior Nasal Swab  Result Value Ref Range   SARS Coronavirus 2 by RT PCR NEGATIVE NEGATIVE   Influenza A by PCR NEGATIVE NEGATIVE   Influenza B by PCR NEGATIVE NEGATIVE    Comment: (NOTE) The Xpert Xpress SARS-CoV-2/FLU/RSV plus assay is intended as an aid in the diagnosis of influenza from Nasopharyngeal swab specimens and should not be used as a sole basis for treatment. Nasal washings and aspirates are unacceptable for Xpert Xpress SARS-CoV-2/FLU/RSV testing.  Fact Sheet for Patients: BloggerCourse.com  Fact Sheet for Healthcare Providers: SeriousBroker.it  This test is not yet approved or cleared by the [place] FDA and has been authorized for detection and/or diagnosis of SARS-CoV-2 by FDA under an Emergency Use Authorization (EUA). This EUA will remain in effect (meaning this test can be used) for the duration of the COVID-19 declaration under Section 564(b)(1) of the Act, 21 U.S.C. section 360bbb-3(b)(1), unless the authorization is terminated or revoked.     Resp Syncytial Virus by PCR NEGATIVE NEGATIVE    Comment: (NOTE) Fact Sheet for Patients: BloggerCourse.com  Fact Sheet for Healthcare Providers: SeriousBroker.it  This test is not yet approved or cleared by the [place] FDA and has been authorized for detection and/or diagnosis of SARS-CoV-2 by FDA under an Emergency Use Authorization  (EUA). This EUA will remain in effect (meaning this test can be used) for the duration of the COVID-19 declaration under Section 564(b)(1) of the Act, 21 U.S.C. section 360bbb-3(b)(1), unless the authorization is terminated or revoked.  Performed at [facility], [address], [place], [place] [address]   CBG monitoring, ED     Status: Abnormal   Collection Time: 08/25/23  5:24 AM  Result Value Ref Range   Glucose-Capillary 59 (L) 70 - 99 mg/dL    Comment: Glucose reference range applies only to samples taken after fasting for at least 8 hours.  CBG monitoring, ED     Status: None   Collection Time: 08/25/23  8:22 AM  Result Value Ref Range   Glucose-Capillary 77 70 - 99 mg/dL    Comment: Glucose reference range applies only to samples taken after fasting for at least 8 hours.  Type and screen [facility]     Status: None   Collection Time: 08/25/23  8:52 AM  Result Value Ref Range   ABO/RH(D) O POS    Antibody Screen NEG    Sample Expiration      08/28/2023,2359 Performed at [facility], [address] [place], [place] [address]   CBG monitoring, ED     Status: None   Collection Time: 08/25/23 12:31 PM  Result Value Ref Range   Glucose-Capillary 70 70 - 99 mg/dL    Comment: Glucose reference range applies only to samples taken after fasting for at least 8 hours.  .  ROS: See HPI   Physical Exam: Vitals:   08/25/23 0937 08/25/23 1250  BP: (!) 168/90 (!) 161/91  Pulse:  75  Resp:  20  Temp:    SpO2:  100%     General: Alert thin adult male NAD HEENT: [place]/AT, nonicteric, MMM Neck: Supple, no JVD Heart: RRR no MRG Lungs: CTA bilaterally nonlabored breathing Abdomen: NABS, soft, right upper quadrant tenderness to palpation, nondistended Extremities: No pedal edema Skin: Warm dry no overt rash or ulcer with exposed skin Neuro: Alert O x 3 no acute focal deficits appreciated Dialysis Access: Left UA AV fistula positive  bruit   Op  Dialysis Orders: Center: SW , MWF, 3 hrs 45 min.,16 g needles  EDW 61.5 but leaving below last 2 treatments questionable now 59 kg 2K, 2 calcium bath Heparin 2000 units, Hectorol 1 mics q. dialysis  left upper arm AV fistula,  No Mircera last hemoglobin 13   Assessment/Plan Cholelithiasis/acute cholecystitis= admit team workup planned with general surgery seeing patient for possible surgery in the morning n.p.o. for now ESRD -HD MWF, no need for dialysis today, plan first thing in the a.m. at the less surgery than.  Lab and volume stable/using 16-gauge needles Hypertension/volume  -presentation hypertensive probably secondary to discomfort //hydralazine as needed and continued amlodipine and carvedilol no excess volume on exam /even with HD tomorrow Anemia  -Hgb 13.7, no ESA as outpatient Metabolic bone disease -calcium okay continue binders with food add Hectorol Nutrition -current albumin 4.4 Diabetes mellitus T 2 = plan per admit  Lenny Pastel, PA-C [facility] Beeper [phone] 08/25/2023, 12:58 PM

## 2023-08-26 ENCOUNTER — Other Ambulatory Visit: Payer: Self-pay

## 2023-08-26 ENCOUNTER — Observation Stay (HOSPITAL_COMMUNITY): Payer: 59 | Admitting: Anesthesiology

## 2023-08-26 ENCOUNTER — Encounter (HOSPITAL_COMMUNITY)
Admission: EM | Disposition: A | Discharge status: Home / Self Care | Payer: Self-pay | Source: Home / Self Care | Attending: Emergency Medicine

## 2023-08-26 ENCOUNTER — Observation Stay (HOSPITAL_BASED_OUTPATIENT_CLINIC_OR_DEPARTMENT_OTHER): Payer: 59 | Admitting: Anesthesiology

## 2023-08-26 ENCOUNTER — Encounter (HOSPITAL_COMMUNITY): Payer: Self-pay | Admitting: Internal Medicine

## 2023-08-26 DIAGNOSIS — E1122 Type 2 diabetes mellitus with diabetic chronic kidney disease: Secondary | ICD-10-CM | POA: Diagnosis not present

## 2023-08-26 DIAGNOSIS — Z720 Tobacco use: Secondary | ICD-10-CM | POA: Diagnosis not present

## 2023-08-26 DIAGNOSIS — I120 Hypertensive chronic kidney disease with stage 5 chronic kidney disease or end stage renal disease: Secondary | ICD-10-CM | POA: Diagnosis not present

## 2023-08-26 DIAGNOSIS — N186 End stage renal disease: Secondary | ICD-10-CM

## 2023-08-26 DIAGNOSIS — K812 Acute cholecystitis with chronic cholecystitis: Secondary | ICD-10-CM | POA: Diagnosis not present

## 2023-08-26 DIAGNOSIS — K8012 Calculus of gallbladder with acute and chronic cholecystitis without obstruction: Secondary | ICD-10-CM | POA: Diagnosis not present

## 2023-08-26 DIAGNOSIS — Z992 Dependence on renal dialysis: Secondary | ICD-10-CM | POA: Diagnosis not present

## 2023-08-26 DIAGNOSIS — K810 Acute cholecystitis: Secondary | ICD-10-CM | POA: Diagnosis not present

## 2023-08-26 DIAGNOSIS — K8000 Calculus of gallbladder with acute cholecystitis without obstruction: Secondary | ICD-10-CM | POA: Diagnosis not present

## 2023-08-26 DIAGNOSIS — R109 Unspecified abdominal pain: Secondary | ICD-10-CM | POA: Diagnosis not present

## 2023-08-26 HISTORY — PX: CHOLECYSTECTOMY: SHX55

## 2023-08-26 LAB — GLUCOSE, CAPILLARY
Glucose-Capillary: 157 mg/dL — ABNORMAL HIGH (ref 70–99)
Glucose-Capillary: 67 mg/dL — ABNORMAL LOW (ref 70–99)
Glucose-Capillary: 61 mg/dL — ABNORMAL LOW (ref 70–99)
Glucose-Capillary: 136 mg/dL — ABNORMAL HIGH (ref 70–99)
Glucose-Capillary: 84 mg/dL (ref 70–99)
Glucose-Capillary: 125 mg/dL — ABNORMAL HIGH (ref 70–99)

## 2023-08-26 LAB — CBC
HCT: 38.9 % — ABNORMAL LOW (ref 39.0–52.0)
Hemoglobin: 13.3 g/dL (ref 13.0–17.0)
MCH: 29 pg (ref 26.0–34.0)
MCHC: 34.2 g/dL (ref 30.0–36.0)
MCV: 84.9 fL (ref 80.0–100.0)
Platelets: 295 10*3/uL (ref 150–400)
RBC: 4.58 MIL/uL (ref 4.22–5.81)
RDW: 13.6 % (ref 11.5–15.5)
WBC: 5.2 10*3/uL (ref 4.0–10.5)
nRBC: 0 % (ref 0.0–0.2)

## 2023-08-26 LAB — RENAL FUNCTION PANEL
Albumin: 4.1 g/dL (ref 3.5–5.0)
Anion gap: 17 — ABNORMAL HIGH (ref 5–15)
BUN: 37 mg/dL — ABNORMAL HIGH (ref 6–20)
CO2: 27 mmol/L (ref 22–32)
Calcium: 9.2 mg/dL (ref 8.9–10.3)
Chloride: 91 mmol/L — ABNORMAL LOW (ref 98–111)
Creatinine, Ser: 12.76 mg/dL — ABNORMAL HIGH (ref 0.61–1.24)
GFR, Estimated: 4 mL/min — ABNORMAL LOW (ref >60)
Glucose, Bld: 60 mg/dL — ABNORMAL LOW (ref 70–99)
Phosphorus: 6 mg/dL — ABNORMAL HIGH (ref 2.5–4.6)
Potassium: 3.9 mmol/L (ref 3.5–5.1)
Sodium: 135 mmol/L (ref 135–145)

## 2023-08-26 LAB — POCT I-STAT, CHEM 8
BUN: 22 mg/dL — ABNORMAL HIGH (ref 6–20)
Calcium, Ion: 1.02 mmol/L — ABNORMAL LOW (ref 1.15–1.40)
Chloride: 92 mmol/L — ABNORMAL LOW (ref 98–111)
Creatinine, Ser: 7.6 mg/dL — ABNORMAL HIGH (ref 0.61–1.24)
Glucose, Bld: 56 mg/dL — ABNORMAL LOW (ref 70–99)
HCT: 44 % (ref 39.0–52.0)
Hemoglobin: 15 g/dL (ref 13.0–17.0)
Potassium: 3.4 mmol/L — ABNORMAL LOW (ref 3.5–5.1)
Sodium: 136 mmol/L (ref 135–145)
TCO2: 30 mmol/L (ref 22–32)

## 2023-08-26 LAB — HEPATITIS B SURFACE ANTIGEN: Hepatitis B Surface Ag: NONREACTIVE

## 2023-08-26 SURGERY — LAPAROSCOPIC CHOLECYSTECTOMY
Anesthesia: General

## 2023-08-26 MED ORDER — HYDROMORPHONE HCL 1 MG/ML IJ SOLN
INTRAMUSCULAR | Status: AC
  Filled 2023-08-26: qty 0.5

## 2023-08-26 MED ORDER — BUPIVACAINE-EPINEPHRINE 0.25% -1:200000 IJ SOLN
INTRAMUSCULAR | Status: DC | PRN
  Administered 2023-08-26: 7 mL

## 2023-08-26 MED ORDER — DEXTROSE 50 % IV SOLN
12.5000 g | INTRAVENOUS | Status: AC
  Administered 2023-08-26: 12.5 g via INTRAVENOUS

## 2023-08-26 MED ORDER — FENTANYL CITRATE (PF) 100 MCG/2ML IJ SOLN
INTRAMUSCULAR | Status: AC
  Filled 2023-08-26: qty 2

## 2023-08-26 MED ORDER — PROPOFOL 10 MG/ML IV BOLUS
INTRAVENOUS | Status: AC
  Filled 2023-08-26: qty 20

## 2023-08-26 MED ORDER — PHENYLEPHRINE 80 MCG/ML (10ML) SYRINGE FOR IV PUSH (FOR BLOOD PRESSURE SUPPORT)
PREFILLED_SYRINGE | INTRAVENOUS | Status: AC
Start: 2023-08-26 — End: ?
  Filled 2023-08-26: qty 10

## 2023-08-26 MED ORDER — DIPHENHYDRAMINE HCL 50 MG/ML IJ SOLN
INTRAMUSCULAR | Status: AC
  Filled 2023-08-26: qty 1

## 2023-08-26 MED ORDER — LIDOCAINE 2% (20 MG/ML) 5 ML SYRINGE
INTRAMUSCULAR | Status: DC | PRN
  Administered 2023-08-26: 20 mg via INTRAVENOUS

## 2023-08-26 MED ORDER — INDOCYANINE GREEN 25 MG IV SOLR
1.2500 mg | Freq: Once | INTRAVENOUS | Status: DC
  Filled 2023-08-26: qty 10

## 2023-08-26 MED ORDER — SODIUM CHLORIDE 0.9 % IR SOLN
Status: DC | PRN
  Administered 2023-08-26: 1000 mL

## 2023-08-26 MED ORDER — BUPIVACAINE-EPINEPHRINE (PF) 0.25% -1:200000 IJ SOLN
INTRAMUSCULAR | Status: AC
Start: 2023-08-26 — End: ?
  Filled 2023-08-26: qty 30

## 2023-08-26 MED ORDER — ORAL CARE MOUTH RINSE: 15.0000 mL | Freq: Once | OROMUCOSAL | Status: AC

## 2023-08-26 MED ORDER — LABETALOL HCL 5 MG/ML IV SOLN
INTRAVENOUS | Status: AC
  Filled 2023-08-26: qty 4

## 2023-08-26 MED ORDER — DEXTROSE 5 % IV SOLN: INTRAVENOUS | Status: DC

## 2023-08-26 MED ORDER — SUGAMMADEX SODIUM 200 MG/2ML IV SOLN
INTRAVENOUS | Status: DC | PRN
  Administered 2023-08-26: 200 mg via INTRAVENOUS

## 2023-08-26 MED ORDER — CHLORHEXIDINE GLUCONATE 0.12 % MT SOLN
OROMUCOSAL | Status: AC
  Administered 2023-08-26: 15 mL via OROMUCOSAL
  Filled 2023-08-26: qty 15

## 2023-08-26 MED ORDER — ONDANSETRON HCL 4 MG/2ML IJ SOLN
INTRAMUSCULAR | Status: DC | PRN
  Administered 2023-08-26: 4 mg via INTRAVENOUS

## 2023-08-26 MED ORDER — LABETALOL HCL 5 MG/ML IV SOLN
INTRAVENOUS | Status: DC | PRN
  Administered 2023-08-26: 7.5 mg via INTRAVENOUS

## 2023-08-26 MED ORDER — CHLORHEXIDINE GLUCONATE 0.12 % MT SOLN: 15.0000 mL | Freq: Once | OROMUCOSAL | Status: AC

## 2023-08-26 MED ORDER — MIDAZOLAM HCL 2 MG/2ML IJ SOLN
INTRAMUSCULAR | Status: DC | PRN
  Administered 2023-08-26: 2 mg via INTRAVENOUS

## 2023-08-26 MED ORDER — ONDANSETRON HCL 4 MG/2ML IJ SOLN
INTRAMUSCULAR | Status: AC
  Filled 2023-08-26: qty 2

## 2023-08-26 MED ORDER — DEXAMETHASONE SODIUM PHOSPHATE 10 MG/ML IJ SOLN
INTRAMUSCULAR | Status: AC
  Filled 2023-08-26: qty 1

## 2023-08-26 MED ORDER — ROCURONIUM BROMIDE 10 MG/ML (PF) SYRINGE
PREFILLED_SYRINGE | INTRAVENOUS | Status: DC | PRN
  Administered 2023-08-26: 50 mg via INTRAVENOUS

## 2023-08-26 MED ORDER — PHENYLEPHRINE 80 MCG/ML (10ML) SYRINGE FOR IV PUSH (FOR BLOOD PRESSURE SUPPORT)
PREFILLED_SYRINGE | INTRAVENOUS | Status: DC | PRN
  Administered 2023-08-26: 200 ug via INTRAVENOUS
  Administered 2023-08-26 (×2): 80 ug via INTRAVENOUS
  Administered 2023-08-26: 160 ug via INTRAVENOUS

## 2023-08-26 MED ORDER — FENTANYL CITRATE (PF) 250 MCG/5ML IJ SOLN
INTRAMUSCULAR | Status: DC | PRN
  Administered 2023-08-26 (×2): 50 ug via INTRAVENOUS

## 2023-08-26 MED ORDER — FENTANYL CITRATE (PF) 250 MCG/5ML IJ SOLN
INTRAMUSCULAR | Status: AC
  Filled 2023-08-26: qty 5

## 2023-08-26 MED ORDER — CEFAZOLIN SODIUM-DEXTROSE 1-4 GM/50ML-% IV SOLN
INTRAVENOUS | Status: DC | PRN
  Administered 2023-08-26: 2 g via INTRAVENOUS

## 2023-08-26 MED ORDER — EPHEDRINE SULFATE-NACL 50-0.9 MG/10ML-% IV SOSY
PREFILLED_SYRINGE | INTRAVENOUS | Status: DC | PRN
  Administered 2023-08-26: 12.5 mg via INTRAVENOUS

## 2023-08-26 MED ORDER — FENTANYL CITRATE (PF) 100 MCG/2ML IJ SOLN
25.0000 ug | INTRAMUSCULAR | Status: DC | PRN
  Administered 2023-08-26 (×3): 50 ug via INTRAVENOUS

## 2023-08-26 MED ORDER — LIDOCAINE 2% (20 MG/ML) 5 ML SYRINGE
INTRAMUSCULAR | Status: AC
Start: 2023-08-26 — End: ?
  Filled 2023-08-26: qty 5

## 2023-08-26 MED ORDER — SODIUM CHLORIDE 0.9 % IV SOLN: INTRAVENOUS | Status: DC | PRN

## 2023-08-26 MED ORDER — OXYCODONE HCL 5 MG PO TABS
5.0000 mg | ORAL_TABLET | Freq: Four times a day (QID) | ORAL | Status: DC | PRN
  Administered 2023-08-26 – 2023-08-27 (×3): 10 mg via ORAL
  Filled 2023-08-26 (×4): qty 2

## 2023-08-26 MED ORDER — HEMOSTATIC AGENTS (NO CHARGE) OPTIME
TOPICAL | Status: DC | PRN
  Administered 2023-08-26: 1 via TOPICAL

## 2023-08-26 MED ORDER — ESMOLOL HCL 100 MG/10ML IV SOLN
INTRAVENOUS | Status: AC
  Filled 2023-08-26: qty 10

## 2023-08-26 MED ORDER — ROCURONIUM BROMIDE 10 MG/ML (PF) SYRINGE
PREFILLED_SYRINGE | INTRAVENOUS | Status: AC
  Filled 2023-08-26: qty 10

## 2023-08-26 MED ORDER — ACETAMINOPHEN 500 MG PO TABS
1000.0000 mg | ORAL_TABLET | Freq: Three times a day (TID) | ORAL | Status: DC
  Administered 2023-08-26 – 2023-08-27 (×3): 1000 mg via ORAL
  Filled 2023-08-26 (×3): qty 2

## 2023-08-26 MED ORDER — PROPOFOL 10 MG/ML IV BOLUS
INTRAVENOUS | Status: DC | PRN
  Administered 2023-08-26: 40 mg via INTRAVENOUS
  Administered 2023-08-26: 100 mg via INTRAVENOUS

## 2023-08-26 MED ORDER — HYDROMORPHONE HCL 1 MG/ML IJ SOLN
INTRAMUSCULAR | Status: AC
  Filled 2023-08-26: qty 1

## 2023-08-26 MED ORDER — MIDAZOLAM HCL 2 MG/2ML IJ SOLN
INTRAMUSCULAR | Status: AC
  Filled 2023-08-26: qty 2

## 2023-08-26 MED ORDER — DEXAMETHASONE SODIUM PHOSPHATE 10 MG/ML IJ SOLN
INTRAMUSCULAR | Status: DC | PRN
  Administered 2023-08-26: 4 mg via INTRAVENOUS

## 2023-08-26 MED ORDER — HYDROMORPHONE HCL 1 MG/ML IJ SOLN
0.2500 mg | INTRAMUSCULAR | Status: DC | PRN
  Administered 2023-08-26: 0.5 mg via INTRAVENOUS

## 2023-08-26 SURGICAL SUPPLY — 52 items
ADH SKN CLS APL DERMABOND .7 (GAUZE/BANDAGES/DRESSINGS) ×1
APL PRP STRL LF DISP 70% ISPRP (MISCELLANEOUS) ×1
APPLIER CLIP 5 13 M/L LIGAMAX5 (MISCELLANEOUS) ×1
APR CLP MED LRG 5 ANG JAW (MISCELLANEOUS) ×1
BAG COUNTER SPONGE SURGICOUNT (BAG) ×1 IMPLANT
BAG SPEC RTRVL 10 TROC 200 (ENDOMECHANICALS) ×1
BAG SPNG CNTER NS LX DISP (BAG) ×1
BIOPATCH RED 1 DISK 7.0 (GAUZE/BANDAGES/DRESSINGS) IMPLANT
BLADE CLIPPER SURG (BLADE) IMPLANT
CANISTER SUCT 3000ML PPV (MISCELLANEOUS) ×1 IMPLANT
CHLORAPREP W/TINT 26 (MISCELLANEOUS) ×1 IMPLANT
CLIP APPLIE 5 13 M/L LIGAMAX5 (MISCELLANEOUS) ×1 IMPLANT
CNTNR URN SCR LID CUP LEK RST (MISCELLANEOUS) IMPLANT
CONT SPEC 4OZ STRL OR WHT (MISCELLANEOUS) ×1
COVER SURGICAL LIGHT HANDLE (MISCELLANEOUS) ×1 IMPLANT
DERMABOND ADVANCED .7 DNX12 (GAUZE/BANDAGES/DRESSINGS) ×1 IMPLANT
DRAIN CHANNEL 19F RND (DRAIN) IMPLANT
DRSG TEGADERM 4X4.75 (GAUZE/BANDAGES/DRESSINGS) IMPLANT
ELECT REM PT RETURN 9FT ADLT (ELECTROSURGICAL) ×1
ELECTRODE REM PT RTRN 9FT ADLT (ELECTROSURGICAL) ×1 IMPLANT
ENDOLOOP SUT PDS II 0 18 (SUTURE) IMPLANT
EVACUATOR SILICONE 100CC (DRAIN) IMPLANT
GLOVE BIO SURGEON STRL SZ7 (GLOVE) ×1 IMPLANT
GLOVE BIOGEL PI IND STRL 7.5 (GLOVE) ×1 IMPLANT
GOWN STRL REUS W/ TWL LRG LVL3 (GOWN DISPOSABLE) ×3 IMPLANT
GOWN STRL REUS W/TWL LRG LVL3 (GOWN DISPOSABLE) ×3
GRASPER SUT TROCAR 14GX15 (MISCELLANEOUS) ×1 IMPLANT
HEMOSTAT SNOW SURGICEL 2X4 (HEMOSTASIS) IMPLANT
IRRIG SUCT STRYKERFLOW 2 WTIP (MISCELLANEOUS) ×1
IRRIGATION SUCT STRKRFLW 2 WTP (MISCELLANEOUS) ×1 IMPLANT
KIT BASIN OR (CUSTOM PROCEDURE TRAY) ×1 IMPLANT
KIT IMAGING PINPOINTPAQ (MISCELLANEOUS) IMPLANT
KIT TURNOVER KIT B (KITS) ×1 IMPLANT
NS IRRIG 1000ML POUR BTL (IV SOLUTION) ×1 IMPLANT
PAD ARMBOARD 7.5X6 YLW CONV (MISCELLANEOUS) ×1 IMPLANT
POUCH RETRIEVAL ECOSAC 10 (ENDOMECHANICALS) ×1 IMPLANT
SCISSORS LAP 5X35 DISP (ENDOMECHANICALS) ×1 IMPLANT
SET TUBE SMOKE EVAC HIGH FLOW (TUBING) ×1 IMPLANT
SLEEVE Z-THREAD 5X100MM (TROCAR) ×2 IMPLANT
SPECIMEN JAR SMALL (MISCELLANEOUS) ×1 IMPLANT
STRIP CLOSURE SKIN 1/2X4 (GAUZE/BANDAGES/DRESSINGS) ×1 IMPLANT
SUT ETHILON 2 0 FS 18 (SUTURE) IMPLANT
SUT MNCRL AB 4-0 PS2 18 (SUTURE) ×1 IMPLANT
SUT VICRYL 0 UR6 27IN ABS (SUTURE) ×1 IMPLANT
TAPE STRIPS DRAPE STRL (GAUZE/BANDAGES/DRESSINGS) IMPLANT
TOWEL GREEN STERILE (TOWEL DISPOSABLE) ×1 IMPLANT
TOWEL GREEN STERILE FF (TOWEL DISPOSABLE) ×1 IMPLANT
TRAY LAPAROSCOPIC MC (CUSTOM PROCEDURE TRAY) ×1 IMPLANT
TROCAR BALLN 12MMX100 BLUNT (TROCAR) ×1 IMPLANT
TROCAR Z-THREAD OPTICAL 5X100M (TROCAR) ×1 IMPLANT
WARMER LAPAROSCOPE (MISCELLANEOUS) ×1 IMPLANT
WATER STERILE IRR 1000ML POUR (IV SOLUTION) ×1 IMPLANT

## 2023-08-26 NOTE — Plan of Care (Signed)
  Problem: Education: Goal: Knowledge of General Education information will improve Description: Including pain rating scale, medication(s)/side effects and non-pharmacologic comfort measures Outcome: Progressing   Problem: Clinical Measurements: Goal: Respiratory complications will improve Outcome: Progressing   Problem: Activity: Goal: Risk for activity intolerance will decrease Outcome: Progressing   

## 2023-08-26 NOTE — Progress Notes (Signed)
   Subjective/Chief Complaint: Still with ruq pain, some nausea   Objective: Vital signs in last 24 hours: Temp:  [97.7 F (36.5 C)-98.6 F (37 C)] 98.4 F (36.9 C) (10/23 0453) Pulse Rate:  [67-81] 79 (10/23 0453) Resp:  [17-20] 17 (10/23 0453) BP: (130-174)/(85-101) 139/95 (10/23 0453) SpO2:  [93 %-100 %] 100 % (10/23 0453) Weight:  [60.2 kg] 60.2 kg (10/22 1430) Last BM Date : 08/19/23  Intake/Output from previous day: 10/22 0701 - 10/23 0700 In: 400 [P.O.:300; IV Piggyback:100] Out: 150 [Emesis/NG output:150] Intake/Output this shift: No intake/output data recorded.  Ab ruq tender to palpation  Lab Results:  Recent Labs    08/24/23 1947 08/26/23 0424  WBC 5.4 5.2  HGB 13.7 13.3  HCT 40.7 38.9*  PLT 305 295   BMET Recent Labs    08/24/23 1947 08/26/23 0424  NA 136 135  K 3.4* 3.9  CL 92* 91*  CO2 26 27  GLUCOSE 73 60*  BUN 22* 37*  CREATININE 9.36* 12.76*  CALCIUM 8.9 9.2   PT/INR Recent Labs    08/25/23 1601  LABPROT 14.3  INR 1.1   ABG No results for input(s): "PHART", "HCO3" in the last 72 hours.  Invalid input(s): "PCO2", "PO2"  Studies/Results: US Abdomen Limited RUQ (LIVER/GB)  Result Date: 08/25/2023 CLINICAL DATA:  Right upper quadrant abdominal pain. EXAM: ULTRASOUND ABDOMEN LIMITED RIGHT UPPER QUADRANT COMPARISON:  08/20/2023. FINDINGS: Gallbladder: Stones are present within the gallbladder and there is mild gallbladder wall thickening at 3.9 mm. Patient reports pain in gallbladder area while scanning per sonographer. Common bile duct: Diameter: 3.1 mm Liver: No focal lesion identified. Within normal limits in parenchymal echogenicity. Portal vein is patent on color Doppler imaging with normal direction of blood flow towards the liver. Other: None. IMPRESSION: Cholelithiasis with mild gallbladder wall thickening. Correlation with physical exam is recommended to exclude acute cholecystitis. Electronically Signed   By: Thornell Sartorius M.D.    On: 08/25/2023 04:50    Anti-infectives: Anti-infectives (From admission, onward)    Start     Dose/Rate Route Frequency Ordered Stop   08/26/23 0600  cefTRIAXone (ROCEPHIN) 2 g in sodium chloride 0.9 % 100 mL IVPB        2 g 200 mL/hr over 30 Minutes Intravenous Every 24 hours 08/25/23 0835     08/25/23 0600  cefTRIAXone (ROCEPHIN) 2 g in sodium chloride 0.9 % 100 mL IVPB        2 g 200 mL/hr over 30 Minutes Intravenous  Once 08/25/23 0559 08/25/23 9604       Assessment/Plan: Cholecystitis -plan for HD this am and surgery later today -discussed surgery again this am, allquestions answered.   Emelia Loron 08/26/2023

## 2023-08-26 NOTE — Anesthesia Procedure Notes (Signed)
Procedure Name: Intubation Date/Time: 08/26/2023 2:23 PM  Performed by: Darlina Guys, CRNAPre-anesthesia Checklist: Patient identified, Emergency Drugs available, Suction available and Patient being monitored Patient Re-evaluated:Patient Re-evaluated prior to induction Oxygen Delivery Method: Circle system utilized Preoxygenation: Pre-oxygenation with 100% oxygen Induction Type: IV induction Ventilation: Mask ventilation without difficulty Laryngoscope Size: Mac and 4 Grade View: Grade I Tube type: Oral Tube size: 7.5 mm Number of attempts: 1 Airway Equipment and Method: Stylet and Oral airway Placement Confirmation: ETT inserted through vocal cords under direct vision, positive ETCO2 and breath sounds checked- equal and bilateral Secured at: 22 cm Tube secured with: Tape Dental Injury: Teeth and Oropharynx as per pre-operative assessment

## 2023-08-26 NOTE — Progress Notes (Signed)
Pt receives out-pt HD at [facility] [place] on MWF. Will assist as needed.   Olivia Canter Renal Navigator [phone]

## 2023-08-26 NOTE — Transfer of Care (Signed)
Immediate Anesthesia Transfer of Care Note  Patient: Levi Hodges  Procedure(s) Performed: LAPAROSCOPIC CHOLECYSTECTOMY INDOCYANINE GREEN FLUORESCENCE IMAGING (ICG)  Patient Location: PACU  Anesthesia Type:General  Level of Consciousness: awake, alert , and patient cooperative  Airway & Oxygen Therapy: Patient Spontanous Breathing  Post-op Assessment: Report given to RN and Post -op Vital signs reviewed and stable  Post vital signs: Reviewed and stable  Last Vitals:  Vitals Value Taken Time  BP 168/88 08/26/23 1547  Temp    Pulse 66 08/26/23 1551  Resp 13 08/26/23 1551  SpO2 100 % 08/26/23 1551  Vitals shown include unfiled device data.  Last Pain:  Vitals:   08/26/23 1335  TempSrc:   PainSc: 7          Complications: No notable events documented.

## 2023-08-26 NOTE — Op Note (Addendum)
Preoperative diagnosis: Acute cholecystitis Postoperative diagnosis: acute cholecystitis Procedure: Laparoscopic cholecystectomy Surgeon: Dr. Harden Mo Anesthesia: General Complications: None Estimated blood loss: 200 cc Specimens: gallbladder and stones to pathology Sponge needle count was correct completion Disposition recovery stable condition   Indications:  47 yom with several days of abdominal pain. Tender ruq and has [place] that shows what appears to be calculous cholecystitis. He underwent HD this am and we discussed proceeding with lap chole today.    Procedure: After informed consent was obtained he was taken to the operating room.  He was given antibiotics.  SCDs were in place.  He was placed under general anesthesia without complication.  He was prepped and draped in a standard sterile surgical fashion.  A surgical timeout was then performed.   I made a vertical incision below the umbilicus.  I grasped the fascia with Kocher clamps.  I made an incisoin and entered the peritoneum bluntly.    I placed a 0 Vicryl pursestring suture.  I insufflated the abdomen to 15 mmHg pressure.  I then inserted 3 additional 5 mm trocars in the epigastrium and right upper quadrant.  This was done without injury.  The gallbladder was noted to have a lot of adhesions to it.  I took down adhesions from the omentum to the liver.  The gallbladder was tense and I had to aspirate it just to be able to grasp it.  Eventually was retracted cephalad.  I took down a lot of adhesions laterally from the omentum.  The duodenum was very adherent to this as well.  I took this down with a combination of blunt and sharp dissection.  There is no injury to the duodenum that was noted during the operation and I looked at this at the end as well.  Once I had done this I dissected the triangle.  I clipped the artery three times and divided it leaving two in place. I clearly obtained a critical view of safety.  I then took the  gallbladder completely off the cystic plate and just had it attached at the cystic duct.  I elected to place an Endoloop proximally.  I then divided the gallbladder and placed in a retrieval bag.  I then removed this from the abdomen.  Hemostasis was obtained.  I did place a piece of snow.  I did place a 84 [place] Blake drain in his right upper quadrant and secured this with a 2-0 nylon suture as well.  I then desufflated the abdomen and remove the remaining trocars.  I tied my pursestring down.  I had placed another 0 Vicryl using the suture passer to completely obliterate the umbilical defect.  These were then closed with 4-0 Monocryl and glue.  He tolerated this well was extubated and transferred recovery stable.

## 2023-08-26 NOTE — Progress Notes (Signed)
HOSPITALIST ROUNDING NOTE Levi Hodges WUJ:[id number]  DOB: May 12, 1976  DOA: 08/24/2023  PCP: [name], MD  08/26/2023,8:53 AM   LOS: 1 day      Code Status: Full code From: Home  current Dispo: Likely home    47 year old male HTN DM TY 2 ESRD MWF since 03/2022 HFpEF hyperlipidemia Uncontrolled hypertension previously  08/19/2023 ED visit-CT scan without acute findings nontoxic-appearing then  10/17 PCP televisit with nausea constipation MiraLAX did not help started Protonix Zofran Return to ED 10/21 flank pain inability to keep down p.o.-RUQ [place] = gallbladder wall thickening 3.9 cm CBD 3.1 mm 10/22 General Surgery consulted, renal consulted Headed to surgery  Plan  Acute cholecystitis N.p.o., surgery later today after HD-pain control oxycodone 5 every 6 as needed moderate pain, Dilaudid 0.5 every 2 as needed severe pain continuing ceftriaxone 2 g every 24  Chronic HFpEF last EF 03/24/2354-60% grade 2 dysfunction controlled by HD HTN, uncontrolled Continue Coreg 6.25 twice daily amlodipine 10-prior to admission not on diuretics because of presumed dialysis controlling his volume He will either need more meds or a decrease in his blood pressure regimen control as per nephrology because his blood pressure remains relatively uncontrolled  ESRD MWF [facility] 61.5 Continue Renvela 800 3 times daily when able to eat Continue Auryxia 420 3 times daily when able to eat Getting dialysis now as per schedule no signs of volume overload Phosphorus and other management as per renal  DM TY 2 chronic\with complication of glomerulosclerosis Some hypoglycemia earlier today which will be checked again per RN--I have placed him on D5 75 cc/H as he will be n.p.o. Does not appear to be taking insulin based on MAR-continue gabapentin 300 twice at bedtime for neuropathic component to his pains It also looks like he may have gastroparesis so may need to resume Reglan 5 twice daily [not sure  if this was part of presenting issues with gallbladder problems]  DVT prophylaxis: Heparin  Status is: Observation The patient will require care spanning > 2 midnights and should be moved to inpatient because:   Needs cholecystectomy      Subjective: Seen on HD unit-somewhat frustrated at turn of events hungry also feels a little lightheaded Some mild abdominal pain  Objective + exam Vitals:   08/25/23 1430 08/25/23 1700 08/25/23 2003 08/26/23 0453  BP: (!) 164/101 (!) 174/93 (!) 169/92 (!) 139/95  Pulse: 72 67 81 79  Resp: 18 18 17 17   Temp: 98 F (36.7 C) 98.4 F (36.9 C) 97.7 F (36.5 C) 98.4 F (36.9 C)  TempSrc: Oral  Oral   SpO2: 98% 93% 98% 100%  Weight: 60.2 kg     Height:       Filed Weights   08/24/23 1934 08/25/23 1430  Weight: 59.2 kg 60.2 kg    Examination:  Awake coherent no distress looks fair feels okay although in pain Chest is clear no wheeze Abdomen is soft No lower extremity edema Chest is clear S1-S2 no murmur  Data Reviewed: reviewed   CBC    Component Value Date/Time   WBC 5.2 08/26/2023 0424   RBC 4.58 08/26/2023 0424   HGB 13.3 08/26/2023 0424   HGB 12.5 (L) 10/30/2022 1045   HCT 38.9 (L) 08/26/2023 0424   PLT 295 08/26/2023 0424   PLT 287 10/30/2022 1045   MCV 84.9 08/26/2023 0424   MCH 29.0 08/26/2023 0424   MCHC 34.2 08/26/2023 0424   RDW 13.6 08/26/2023 0424   LYMPHSABS 2.3  10/30/2022 1045   MONOABS 0.6 10/30/2022 1045   EOSABS 0.4 10/30/2022 1045   BASOSABS 0.1 10/30/2022 1045      Latest Ref Rng & Units 08/26/2023    4:24 AM 08/24/2023    7:47 PM 08/19/2023    9:24 PM  CMP  Glucose 70 - 99 mg/dL 60  73  91   BUN 6 - 20 mg/dL 37  22  26   Creatinine 0.61 - 1.24 mg/dL 16.10  9.60  4.54   Sodium 135 - 145 mmol/L 135  136  133   Potassium 3.5 - 5.1 mmol/L 3.9  3.4  3.8   Chloride 98 - 111 mmol/L 91  92  90   CO2 22 - 32 mmol/L 27  26  30    Calcium 8.9 - 10.3 mg/dL 9.2  8.9  8.7   Total Protein 6.5 - 8.1 g/dL   8.4    Total Bilirubin 0.3 - 1.2 mg/dL  1.2    Alkaline Phos 38 - 126 U/L  52    AST 15 - 41 U/L  31    ALT 0 - 44 U/L  22       Scheduled Meds:  amLODipine  10 mg Oral Daily   carvedilol  6.25 mg Oral BID   Chlorhexidine Gluconate Cloth  6 each Topical Q0600   doxercalciferol  1 mcg Intravenous Q M,W,F-HD   heparin injection (subcutaneous)  5,000 Units Subcutaneous Q8H   indocyanine green  7.5 mg Intravenous Once   nicotine  21 mg Transdermal Daily   Continuous Infusions:  cefTRIAXone (ROCEPHIN)  IV     dextrose      Time  55  Rhetta Mura, MD  [facility]

## 2023-08-26 NOTE — Progress Notes (Signed)
Hypoglycemic Event  CBG: 67  Treatment: D50 25 mL (12.5 gm)  Symptoms: Shaky  Follow-up CBG: Time:0529 CBG Result:157  Possible Reasons for Event: Inadequate meal intake  Comments/MD notified:Per hypoglycemia protocol    Megumi Treaster Joselita

## 2023-08-26 NOTE — Plan of Care (Signed)

## 2023-08-26 NOTE — Progress Notes (Signed)
   08/26/23 1245  Vitals  Temp 97.8 F (36.6 C)  Pulse Rate 65  Resp 14  BP (!) 177/90  SpO2 100 %  Post Treatment  Dialyzer Clearance Lightly streaked  Hemodialysis Intake (mL) 0 mL  Liters Processed 54  Fluid Removed (mL) 0 mL  Tolerated HD Treatment Yes  AVG/AVF Arterial Site Held (minutes) 10 minutes  AVG/AVF Venous Site Held (minutes) 10 minutes   Received patient in bed to unit.  Alert and oriented.  Informed consent signed and in chart.   TX duration:3hrs  Patient tolerated well.  Transported back to the room  Alert, without acute distress.  Hand-off given to patient's nurse.   Access used: LAVF Access issues: none  Total UF removed: 0 Medication(s) given: hecterol Pt to OR post tx   [facility]

## 2023-08-26 NOTE — Progress Notes (Signed)
  [facility] Progress Note    Assessment/ Plan:   Cholelithiasis/acute cholecystitis- surgery following, plan for OR today after HD ESRD -HD MWF, 16g needles. Tolerating HD today, next HD Friday if still here Hypertension/volume  -spikes in BP presumed to be secondary to pain, will UF as tolerated Anemia  -Hgb 13.3, no ESA as outpatient, monitor for now Metabolic bone disease -calcium okay continue binders with food add Hectorol. PO4 6, cont to trend Nutrition -current albumin 4.1 Diabetes mellitus T 2 - per primary  Anthony Sar, MD [facility]   Subjective:   Patient seen and examined on HD. Tolerating treatment. Still with ongoing abd pain, is looking forward to getting the surgery done.   Objective:   BP (!) 160/90   Pulse (!) 57   Temp 97.8 F (36.6 C)   Resp 14   Ht 5\' 9"  (1.753 m)   Wt 60.2 kg   SpO2 99%   BMI 19.60 kg/m   Intake/Output Summary (Last 24 hours) at 08/26/2023 9147 Last data filed at 08/26/2023 0600 Gross per 24 hour  Intake 300 ml  Output 150 ml  Net 150 ml   Weight change: 1 kg  Physical Exam: Gen: NAD CVS: RRR Resp: normal WOB, unlabored Abd: soft Ext: no edema Neuro: awake, alert Dialysis access: AVF in use  Imaging: US Abdomen Limited RUQ (LIVER/GB)  Result Date: 08/25/2023 CLINICAL DATA:  Right upper quadrant abdominal pain. EXAM: ULTRASOUND ABDOMEN LIMITED RIGHT UPPER QUADRANT COMPARISON:  08/20/2023. FINDINGS: Gallbladder: Stones are present within the gallbladder and there is mild gallbladder wall thickening at 3.9 mm. Patient reports pain in gallbladder area while scanning per sonographer. Common bile duct: Diameter: 3.1 mm Liver: No focal lesion identified. Within normal limits in parenchymal echogenicity. Portal vein is patent on color Doppler imaging with normal direction of blood flow towards the liver. Other: None. IMPRESSION: Cholelithiasis with mild gallbladder wall thickening. Correlation with  physical exam is recommended to exclude acute cholecystitis. Electronically Signed   By: Thornell Sartorius M.D.   On: 08/25/2023 04:50    Labs: BMET Recent Labs  Lab 08/19/23 2124 08/24/23 1947 08/26/23 0424  NA 133* 136 135  K 3.8 3.4* 3.9  CL 90* 92* 91*  CO2 30 26 27   GLUCOSE 91 73 60*  BUN 26* 22* 37*  CREATININE 7.76* 9.36* 12.76*  CALCIUM 8.7* 8.9 9.2  PHOS  --   --  6.0*   CBC Recent Labs  Lab 08/19/23 2124 08/24/23 1947 08/26/23 0424  WBC 6.0 5.4 5.2  HGB 13.7 13.7 13.3  HCT 41.6 40.7 38.9*  MCV 88.5 86.4 84.9  PLT 270 305 295    Medications:     amLODipine  10 mg Oral Daily   carvedilol  6.25 mg Oral BID   Chlorhexidine Gluconate Cloth  6 each Topical Q0600   doxercalciferol  1 mcg Intravenous Q M,W,F-HD   heparin injection (subcutaneous)  5,000 Units Subcutaneous Q8H   indocyanine green  7.5 mg Intravenous Once   nicotine  21 mg Transdermal Daily      Anthony Sar, MD [facility] 08/26/2023, 9:42 AM

## 2023-08-27 ENCOUNTER — Encounter (HOSPITAL_COMMUNITY): Payer: Self-pay | Admitting: General Surgery

## 2023-08-27 ENCOUNTER — Emergency Department (HOSPITAL_BASED_OUTPATIENT_CLINIC_OR_DEPARTMENT_OTHER)
Admission: EM | Admit: 2023-08-27 | Discharge: 2023-08-27 | Discharge status: Against Medical Advice | Payer: 59 | Source: Home / Self Care

## 2023-08-27 ENCOUNTER — Other Ambulatory Visit: Payer: Self-pay

## 2023-08-27 DIAGNOSIS — K810 Acute cholecystitis: Secondary | ICD-10-CM | POA: Diagnosis not present

## 2023-08-27 DIAGNOSIS — Z992 Dependence on renal dialysis: Secondary | ICD-10-CM | POA: Diagnosis not present

## 2023-08-27 DIAGNOSIS — T8189XA Other complications of procedures, not elsewhere classified, initial encounter: Secondary | ICD-10-CM | POA: Insufficient documentation

## 2023-08-27 DIAGNOSIS — Z5321 Procedure and treatment not carried out due to patient leaving prior to being seen by health care provider: Secondary | ICD-10-CM | POA: Insufficient documentation

## 2023-08-27 DIAGNOSIS — I120 Hypertensive chronic kidney disease with stage 5 chronic kidney disease or end stage renal disease: Secondary | ICD-10-CM | POA: Diagnosis not present

## 2023-08-27 DIAGNOSIS — K8000 Calculus of gallbladder with acute cholecystitis without obstruction: Secondary | ICD-10-CM | POA: Diagnosis not present

## 2023-08-27 DIAGNOSIS — N186 End stage renal disease: Secondary | ICD-10-CM | POA: Diagnosis not present

## 2023-08-27 DIAGNOSIS — R109 Unspecified abdominal pain: Secondary | ICD-10-CM | POA: Diagnosis not present

## 2023-08-27 DIAGNOSIS — K8012 Calculus of gallbladder with acute and chronic cholecystitis without obstruction: Secondary | ICD-10-CM | POA: Diagnosis not present

## 2023-08-27 LAB — HEPATITIS B SURFACE ANTIBODY, QUANTITATIVE: Hep B S AB Quant (Post): 387 m[IU]/mL

## 2023-08-27 LAB — GLUCOSE, CAPILLARY: Glucose-Capillary: 83 mg/dL (ref 70–99)

## 2023-08-27 LAB — RENAL FUNCTION PANEL
Albumin: 4 g/dL (ref 3.5–5.0)
Anion gap: 17 — ABNORMAL HIGH (ref 5–15)
BUN: 34 mg/dL — ABNORMAL HIGH (ref 6–20)
CO2: 30 mmol/L (ref 22–32)
Calcium: 9.2 mg/dL (ref 8.9–10.3)
Chloride: 89 mmol/L — ABNORMAL LOW (ref 98–111)
Creatinine, Ser: 10.36 mg/dL — ABNORMAL HIGH (ref 0.61–1.24)
GFR, Estimated: 6 mL/min — ABNORMAL LOW (ref >60)
Glucose, Bld: 85 mg/dL (ref 70–99)
Phosphorus: 7.2 mg/dL — ABNORMAL HIGH (ref 2.5–4.6)
Potassium: 3.8 mmol/L (ref 3.5–5.1)
Sodium: 136 mmol/L (ref 135–145)

## 2023-08-27 MED ORDER — OXYCODONE HCL 10 MG PO TABS: 5.0000 mg | ORAL_TABLET | Freq: Four times a day (QID) | ORAL | 0 refills | Status: DC | PRN

## 2023-08-27 MED ORDER — DOCUSATE SODIUM 100 MG PO CAPS: 100.0000 mg | ORAL_CAPSULE | Freq: Two times a day (BID) | ORAL | Status: DC

## 2023-08-27 MED ORDER — CARVEDILOL 12.5 MG PO TABS
12.5000 mg | ORAL_TABLET | Freq: Two times a day (BID) | ORAL | Status: DC
  Administered 2023-08-27: 12.5 mg via ORAL
  Filled 2023-08-27: qty 1

## 2023-08-27 MED ORDER — PANTOPRAZOLE SODIUM 40 MG PO TBEC
40.0000 mg | DELAYED_RELEASE_TABLET | Freq: Every day | ORAL | Status: DC
  Administered 2023-08-27: 40 mg via ORAL
  Filled 2023-08-27: qty 1

## 2023-08-27 MED ORDER — NICOTINE 21 MG/24HR TD PT24: 21.0000 mg | MEDICATED_PATCH | Freq: Every day | TRANSDERMAL | 0 refills | Status: AC

## 2023-08-27 MED ORDER — ACETAMINOPHEN 500 MG PO TABS: 1000.0000 mg | ORAL_TABLET | Freq: Four times a day (QID) | ORAL | Status: AC | PRN

## 2023-08-27 MED ORDER — GABAPENTIN 300 MG PO CAPS: 300.0000 mg | ORAL_CAPSULE | Freq: Every day | ORAL | Status: DC

## 2023-08-27 MED ORDER — CARVEDILOL 12.5 MG PO TABS: 12.5000 mg | ORAL_TABLET | Freq: Two times a day (BID) | ORAL | Status: DC

## 2023-08-27 MED ORDER — GABAPENTIN 300 MG PO CAPS: 300.0000 mg | ORAL_CAPSULE | Freq: Every day | ORAL | Status: AC

## 2023-08-27 MED ORDER — POLYETHYLENE GLYCOL 3350 17 G PO PACK: 17.0000 g | PACK | Freq: Every day | ORAL | Status: DC | PRN

## 2023-08-27 MED ORDER — POLYETHYLENE GLYCOL 3350 17 G PO PACK: 17.0000 g | PACK | Freq: Every day | ORAL | Status: AC | PRN

## 2023-08-27 MED ORDER — FERRIC CITRATE 1 GM 210 MG(FE) PO TABS
420.0000 mg | ORAL_TABLET | Freq: Three times a day (TID) | ORAL | Status: DC
  Filled 2023-08-27: qty 2

## 2023-08-27 MED ORDER — DOCUSATE SODIUM 100 MG PO CAPS: 100.0000 mg | ORAL_CAPSULE | Freq: Two times a day (BID) | ORAL | Status: AC

## 2023-08-27 MED ORDER — SEVELAMER CARBONATE 800 MG PO TABS: 800.0000 mg | ORAL_TABLET | Freq: Three times a day (TID) | ORAL | Status: DC

## 2023-08-27 NOTE — Progress Notes (Signed)
Patient ID: Levi Hodges, male   DOB: September 20, 1976, 47 y.o.   MRN: [id number] [facility] Surgery Progress Note  1 Day Post-Op  Subjective: CC-  Asking if he can go home. Sore but abdominal pain is better than prior to surgery. Tolerating diet. No n/v. Passing flatus.  Objective: Vital signs in last 24 hours: Temp:  [97.5 F (36.4 C)-98.6 F (37 C)] 98.3 F (36.8 C) (10/24 0747) Pulse Rate:  [52-70] 70 (10/24 0747) Resp:  [11-20] 18 (10/24 0747) BP: (151-202)/(65-102) 155/90 (10/24 0747) SpO2:  [94 %-100 %] 97 % (10/24 0747) Weight:  [59 kg] 59 kg (10/24 0444) Last BM Date : 08/19/23  Intake/Output from previous day: 10/23 0701 - 10/24 0700 In: 676.7 [I.V.:656.7; IV Piggyback:20] Out: 126 [Emesis/NG output:1; Drains:120; Blood:5] Intake/Output this shift: No intake/output data recorded.  PE: Gen:  Alert, NAD, pleasant Pulm: rate and effort normal on room air Abd: Soft, nondistended, appropriately tender, incisions cdi with steri strips in place, JP bloody/serosanguinous  Lab Results:  Recent Labs    08/24/23 1947 08/26/23 0424 08/26/23 1331  WBC 5.4 5.2  --   HGB 13.7 13.3 15.0  HCT 40.7 38.9* 44.0  PLT 305 295  --    BMET Recent Labs    08/24/23 1947 08/26/23 0424 08/26/23 1331  NA 136 135 136  K 3.4* 3.9 3.4*  CL 92* 91* 92*  CO2 26 27  --   GLUCOSE 73 60* 56*  BUN 22* 37* 22*  CREATININE 9.36* 12.76* 7.60*  CALCIUM 8.9 9.2  --    PT/INR Recent Labs    08/25/23 1601  LABPROT 14.3  INR 1.1   CMP     Component Value Date/Time   NA 136 08/26/2023 1331   K 3.4 (L) 08/26/2023 1331   CL 92 (L) 08/26/2023 1331   CO2 27 08/26/2023 0424   GLUCOSE 56 (L) 08/26/2023 1331   BUN 22 (H) 08/26/2023 1331   CREATININE 7.60 (H) 08/26/2023 1331   CREATININE 8.92 (HH) 10/30/2022 1045   CALCIUM 9.2 08/26/2023 0424   PROT 8.4 (H) 08/24/2023 1947   ALBUMIN 4.1 08/26/2023 0424   AST 31 08/24/2023 1947   AST 18 10/30/2022 1045   ALT 22 08/24/2023 1947    ALT 12 10/30/2022 1045   ALKPHOS 52 08/24/2023 1947   BILITOT 1.2 08/24/2023 1947   BILITOT 0.4 10/30/2022 1045   GFRNONAA 4 (L) 08/26/2023 0424   GFRNONAA 7 (L) 10/30/2022 1045   Lipase     Component Value Date/Time   LIPASE 39 08/24/2023 1947       Studies/Results: No results found.  Anti-infectives: Anti-infectives (From admission, onward)    Start     Dose/Rate Route Frequency Ordered Stop   08/26/23 0600  cefTRIAXone (ROCEPHIN) 2 g in sodium chloride 0.9 % 100 mL IVPB        2 g 200 mL/hr over 30 Minutes Intravenous Every 24 hours 08/25/23 0835     08/25/23 0600  cefTRIAXone (ROCEPHIN) 2 g in sodium chloride 0.9 % 100 mL IVPB        2 g 200 mL/hr over 30 Minutes Intravenous  Once 08/25/23 0559 08/25/23 4098        Assessment/Plan Acute cholecystitis POD#1 s/p laparoscopic cholecystectomy 10/23 Dr. Dwain Sarna [facility] for discharge from surgical standpoint. He will go home with the drain and follow up next week for possible removal if no signs of a bile leak. Discharge instructions on AVS. I will send rx for  pain medication to his pharmacy. He does not need any more antibiotics. This was communicated with the primary team.  ID - rocephin FEN - HH diet VTE - sq heparin Foley - none    LOS: 1 day    Franne Forts, [facility] 08/27/2023, 9:47 AM Please see Amion for pager number during day hours 7:00am-4:30pm

## 2023-08-27 NOTE — Discharge Instructions (Signed)

## 2023-08-27 NOTE — ED Triage Notes (Addendum)
Gallbladder removed 2 days ago , RLQ insicion . JP drain attached , reports bleeding under the tubing that started today . Visible dry blood to the tegaderm , reports had surgery with [facility] .

## 2023-08-27 NOTE — Plan of Care (Signed)

## 2023-08-27 NOTE — Discharge Summary (Signed)
Physician Discharge Summary  Levi Hodges WUJ:[id number] DOB: [birth year] DOA: 08/24/2023  PCP: [name], MD  Admit date: 08/24/2023 Discharge date: 08/27/2023  Time spent: 40 minutes  Recommendations for Outpatient Follow-up:  Requires outpatient general surgery input Requires renal challenging the EDW but also controlling the blood pressure-I will increase his meds as below Please work on compliance with medications and smoking cessation  Discharge Diagnoses:  MAIN problem for hospitalization   Acute cholecystitis ESRD Medical noncompliance Smoker  Please see below for itemized issues addressed in HOpsital- refer to other progress notes for clarity if needed  Discharge Condition: Improved  Diet recommendation: Diabetic renal  Filed Weights   08/25/23 1430 08/26/23 1312 08/27/23 0444  Weight: 60.2 kg 59 kg 59 kg    History of present illness:  47 year old male HTN DM TY 2 ESRD MWF since 03/2022 HFpEF hyperlipidemia Uncontrolled hypertension previously   08/19/2023 ED visit-CT scan without acute findings nontoxic-appearing then  10/17 PCP televisit with nausea constipation MiraLAX did not help started Protonix Zofran Return to ED 10/21 flank pain inability to keep down p.o.-RUQ [place] = gallbladder wall thickening 3.9 cm CBD 3.1 mm 10/22 General Surgery consulted, renal consulted Headed to surgery 10/23 laparoscopic cholecystectomy Dr. Harden Mo   Plan   Acute cholecystitis status postop lap chole 10/23 Patient was given perioperative ceftriaxone which was discontinued by surgery Soft diet and outpatient follow-up for-needs postop instructions wound care follow-up in the outpatient setting   Chronic HFpEF last EF 03/24/2354-60% grade 2 dysfunction controlled by HD HTN, uncontrolled Coreg to 12.5 twice daily, blood pressure uncontrolled-continuing amlodipine 10 Challenge EDW as below   ESRD MWF [facility] EDW 61.5 Continue Renvela 800 3 times daily  Auryxia 420 3 times daily when able to eat [he is not always compliant on his binders apparently] Balanced EDW versus not dependent on blood pressures which are elevated   DM TY 2 chronic\with complication of glomerulosclerosis Hypoglycemic earlier in admission seems to have resolved-D5 discontinued Not taking Reglan anymore and was only on metformin prior to dialysis Given he is ESRD he does not require insulin as he still produces enough endogenous supply     Discharge Exam: Vitals:   08/27/23 0416 08/27/23 0747  BP: (!) 160/95 (!) 155/90  Pulse: 64 70  Resp: 20 18  Temp: 98.3 F (36.8 C) 98.3 F (36.8 C)  SpO2: 94% 97%    Subj on day of d/c   Awake coherent no distress looks well Pain is moderate He is eating and drinking No nausea  General Exam on discharge  EOMI NCAT no focal deficit no icterus no pallor no rales no rhonchi Abdomen soft scars reviewed looks well-healed No lower extremity edema S1-S2 no murmur  Discharge Instructions   Discharge Instructions     Diet - low sodium heart healthy   Complete by: As directed    Discharge instructions   Complete by: As directed    Follow-up with general surgery in the outpatient setting and ensure that you are seen by them and follow their instructions for wound care additionally to other requirements Follow-up with your kidney doctor Ensure that you get labs in about 1 week Return to the emergency room for severe abdominal pain nausea vomiting chest pain Make sure that you take your medications as directed we have increased some of your blood pressure meds your Coreg because of uncontrolled blood pressure and make sure that you take your binders   Increase activity slowly   Complete by:  As directed       Allergies as of 08/27/2023   No Known Allergies      Medication List     STOP taking these medications    metoCLOPramide 5 MG tablet Commonly known as: REGLAN       TAKE these medications     accu-chek multiclix lancets Use as instructed up to 4 times a day   acetaminophen 500 MG tablet Commonly known as: TYLENOL Take 2 tablets (1,000 mg total) by mouth every 6 (six) hours as needed for mild pain (pain score 1-3).   amLODipine 10 MG tablet Commonly known as: NORVASC TAKE 1 TABLET(10 MG) BY MOUTH DAILY What changed: See the new instructions.   Auryxia 1 GM 210 MG(Fe) tablet Generic drug: ferric citrate Take 420 mg by mouth 3 (three) times daily.   carvedilol 12.5 MG tablet Commonly known as: COREG Take 1 tablet (12.5 mg total) by mouth 2 (two) times daily. What changed:  medication strength how much to take when to take this   docusate sodium 100 MG capsule Commonly known as: Colace Take 1 capsule (100 mg total) by mouth 2 (two) times daily.   gabapentin 300 MG capsule Commonly known as: NEURONTIN Take 1 capsule (300 mg total) by mouth at bedtime. What changed:  medication strength when to take this   nicotine 21 mg/24hr patch Commonly known as: NICODERM CQ - dosed in mg/24 hours Place 1 patch (21 mg total) onto the skin daily. Start taking on: August 28, 2023   ondansetron 4 MG tablet Commonly known as: Zofran Take 1 tablet (4 mg total) by mouth every 8 (eight) hours as needed for nausea or vomiting. What changed: when to take this   Oxycodone HCl 10 MG Tabs Take 0.5-1 tablets (5-10 mg total) by mouth every 6 (six) hours as needed for severe pain (pain score 7-10).   pantoprazole 40 MG tablet Commonly known as: PROTONIX Take 1 tablet (40 mg total) by mouth daily.   Pen Needles 31G X 5 MM Misc 1 Package by Does not apply route daily.   polyethylene glycol 17 g packet Commonly known as: MiraLax Take 17 g by mouth daily as needed for mild constipation.   sevelamer carbonate 800 MG tablet Commonly known as: RENVELA Take 1 tablet (800 mg total) by mouth 3 (three) times daily with meals.   simvastatin 20 MG tablet Commonly known as:  ZOCOR TAKE 1 TABLET(20 MG) BY MOUTH AT BEDTIME       No Known Allergies  Follow-up Information     Emelia Loron, MD. Go on 09/16/2023.   Specialty: General Surgery Why: Your appointment is 09/16/23 at 11:50am Please arrive 15 minutes early for check in. Contact information: [address] [place] [address] [phone]         [facility], [place]. Go on 09/03/2023.   Specialty: General Surgery Why: Your appointment is 10/31 at 2pm for drain check Arrive early to check in, fill out paperwork, Bring photo ID and insurance information Contact information: [address] [place] [address] [phone]                 The results of significant diagnostics from this hospitalization (including imaging, microbiology, ancillary and laboratory) are listed below for reference.    Significant Diagnostic Studies: US Abdomen Limited RUQ (LIVER/GB)  Result Date: 08/25/2023 CLINICAL DATA:  Right upper quadrant abdominal pain. EXAM: ULTRASOUND ABDOMEN LIMITED RIGHT UPPER  QUADRANT COMPARISON:  08/20/2023. FINDINGS: Gallbladder: Stones are present within the gallbladder and there is mild gallbladder wall thickening at 3.9 mm. Patient reports pain in gallbladder area while scanning per sonographer. Common bile duct: Diameter: 3.1 mm Liver: No focal lesion identified. Within normal limits in parenchymal echogenicity. Portal vein is patent on color Doppler imaging with normal direction of blood flow towards the liver. Other: None. IMPRESSION: Cholelithiasis with mild gallbladder wall thickening. Correlation with physical exam is recommended to exclude acute cholecystitis. Electronically Signed   By: Thornell Sartorius M.D.   On: 08/25/2023 04:50   CT Renal Stone Study  Result Date: 08/20/2023 CLINICAL DATA:  Abdominal/flank pain with stone suspected EXAM: CT ABDOMEN AND PELVIS WITHOUT CONTRAST TECHNIQUE:  Multidetector CT imaging of the abdomen and pelvis was performed following the standard protocol without IV contrast. RADIATION DOSE REDUCTION: This exam was performed according to the departmental dose-optimization program which includes automated exposure control, adjustment of the mA and/or kV according to patient size and/or use of iterative reconstruction technique. COMPARISON:  04/09/2023 FINDINGS: Lower chest:  No contributory findings. Hepatobiliary: No focal liver abnormality.No evidence of biliary obstruction or stone. Pancreas: Unremarkable. Spleen: Unremarkable. Adrenals/Urinary Tract: Negative adrenals. No hydronephrosis or stone. Symmetric renal atrophy, especially for age, with smooth cortex. Renal length measures 8 cm on the right. Unremarkable bladder. Stomach/Bowel: No obstruction. No appendicitis. Scattered colonic diverticula without superimposed inflammation. Desiccated stool in the distal colon. Vascular/Lymphatic: No acute vascular abnormality. No mass or adenopathy. Reproductive:No pathologic findings. Other: No ascites or pneumoperitoneum. Musculoskeletal: No acute abnormalities. Spondylosis and facet spurring. IMPRESSION: 1. No acute finding.  No hydronephrosis or nephrolithiasis. 2. Colonic diverticulosis. 3. Symmetric renal atrophy. Electronically Signed   By: Tiburcio Pea M.D.   On: 08/20/2023 04:44    Microbiology: Recent Results (from the past 240 hour(s))  Resp panel by RT-PCR (RSV, Flu A&B, Covid) Anterior Nasal Swab     Status: None   Collection Time: 08/25/23  1:10 AM   Specimen: Anterior Nasal Swab  Result Value Ref Range Status   SARS Coronavirus 2 by RT PCR NEGATIVE NEGATIVE Final   Influenza A by PCR NEGATIVE NEGATIVE Final   Influenza B by PCR NEGATIVE NEGATIVE Final    Comment: (NOTE) The Xpert Xpress SARS-CoV-2/FLU/RSV plus assay is intended as an aid in the diagnosis of influenza from Nasopharyngeal swab specimens and should not be used as a sole basis for  treatment. Nasal washings and aspirates are unacceptable for Xpert Xpress SARS-CoV-2/FLU/RSV testing.  Fact Sheet for Patients: BloggerCourse.com  Fact Sheet for Healthcare Providers: SeriousBroker.it  This test is not yet approved or cleared by the [place] FDA and has been authorized for detection and/or diagnosis of SARS-CoV-2 by FDA under an Emergency Use Authorization (EUA). This EUA will remain in effect (meaning this test can be used) for the duration of the COVID-19 declaration under Section 564(b)(1) of the Act, 21 U.S.C. section 360bbb-3(b)(1), unless the authorization is terminated or revoked.     Resp Syncytial Virus by PCR NEGATIVE NEGATIVE Final    Comment: (NOTE) Fact Sheet for Patients: BloggerCourse.com  Fact Sheet for Healthcare Providers: SeriousBroker.it  This test is not yet approved or cleared by the [place] FDA and has been authorized for detection and/or diagnosis of SARS-CoV-2 by FDA under an Emergency Use Authorization (EUA). This EUA will remain in effect (meaning this test can be used) for the duration of the COVID-19 declaration under Section 564(b)(1) of the Act, 21 U.S.C. section 360bbb-3(b)(1), unless  the authorization is terminated or revoked.  Performed at [facility], [address] [place], [place] [address]   Surgical pcr screen     Status: None   Collection Time: 08/25/23  8:09 PM   Specimen: Nasal Mucosa; Nasal Swab  Result Value Ref Range Status   MRSA, PCR NEGATIVE NEGATIVE Final   Staphylococcus aureus NEGATIVE NEGATIVE Final    Comment: (NOTE) The Xpert SA Assay (FDA approved for NASAL specimens in patients 3 years of age and older), is one component of a comprehensive surveillance program. It is not intended to diagnose infection nor to guide or monitor treatment. Performed at [facility], [address], [place], [place] [address]      Labs: Basic Metabolic Panel: Recent Labs  Lab 08/24/23 1947 08/25/23 1601 08/26/23 0424 08/26/23 1331 08/27/23 0848  NA 136  --  135 136 136  K 3.4*  --  3.9 3.4* 3.8  CL 92*  --  91* 92* 89*  CO2 26  --  27  --  30  GLUCOSE 73  --  60* 56* 85  BUN 22*  --  37* 22* 34*  CREATININE 9.36*  --  12.76* 7.60* 10.36*  CALCIUM 8.9  --  9.2  --  9.2  MG  --  2.4  --   --   --   PHOS  --   --  6.0*  --  7.2*   Liver Function Tests: Recent Labs  Lab 08/24/23 1947 08/26/23 0424 08/27/23 0848  AST 31  --   --   ALT 22  --   --   ALKPHOS 52  --   --   BILITOT 1.2  --   --   PROT 8.4*  --   --   ALBUMIN 4.4 4.1 4.0   Recent Labs  Lab 08/24/23 1947  LIPASE 39   No results for input(s): "AMMONIA" in the last 168 hours. CBC: Recent Labs  Lab 08/24/23 1947 08/26/23 0424 08/26/23 1331  WBC 5.4 5.2  --   HGB 13.7 13.3 15.0  HCT 40.7 38.9* 44.0  MCV 86.4 84.9  --   PLT 305 295  --    Cardiac Enzymes: No results for input(s): "CKTOTAL", "CKMB", "CKMBINDEX", "TROPONINI" in the last 168 hours. BNP: BNP (last 3 results) No results for input(s): "BNP" in the last 8760 hours.  ProBNP (last 3 results) No results for input(s): "PROBNP" in the last 8760 hours.  CBG: Recent Labs  Lab 08/26/23 1311 08/26/23 1342 08/26/23 1549 08/26/23 2310 08/27/23 0806  GLUCAP 61* 136* 84 125* 83       Signed:  Rhetta Mura MD   [facility] 08/27/2023, 10:09 AM

## 2023-08-27 NOTE — Progress Notes (Signed)
D/C order noted. Contacted [facility] to advise clinic of pt's d/c today and that pt should resume care tomorrow.   Olivia Canter Renal Navigator [phone]

## 2023-08-27 NOTE — Progress Notes (Signed)
  [facility] Progress Note    Assessment/ Plan:   Cholelithiasis/acute cholecystitis- surgery following, s/p lap choley 10/23 with JP drain which is possibly to be removed in 1 week ESRD -HD MWF, 16g needles. next HD Friday if still here Hypertension/volume  -l UF as tolerated Anemia  -last Hgb 15, no ESA as outpatient, monitor for now Metabolic bone disease -calcium okay continue binders with food add Hectorol. PO4 7.2, cont to trend Nutrition -current albumin 4 Diabetes mellitus T 2 - per primary  Anthony Sar, MD [facility]   Subjective:   Patient seen and examined bedside. S/p lap choley 10/23. Doing well this AM, has some expected soreness. With JP drain. He reports that he overall tolerated HD but did not enjoy being up there in the unit. Open for discharge today.   Objective:   BP (!) 155/90 (BP Location: Right Arm)   Pulse 70   Temp 98.3 F (36.8 C) (Oral)   Resp 18   Ht 5\' 9"  (1.753 m)   Wt 59 kg   SpO2 97%   BMI 19.21 kg/m   Intake/Output Summary (Last 24 hours) at 08/27/2023 1041 Last data filed at 08/27/2023 0559 Gross per 24 hour  Intake 676.67 ml  Output 126 ml  Net 550.67 ml   Weight change: -1.232 kg  Physical Exam: Gen: NAD, sitting up in bed CVS: RRR Resp: normal WOB, unlabored Abd: +jp drain Ext: no edema Neuro: awake, alert Dialysis access: AVF in use  Imaging: No results found.  Labs: BMET Recent Labs  Lab 08/24/23 1947 08/26/23 0424 08/26/23 1331 08/27/23 0848  NA 136 135 136 136  K 3.4* 3.9 3.4* 3.8  CL 92* 91* 92* 89*  CO2 26 27  --  30  GLUCOSE 73 60* 56* 85  BUN 22* 37* 22* 34*  CREATININE 9.36* 12.76* 7.60* 10.36*  CALCIUM 8.9 9.2  --  9.2  PHOS  --  6.0*  --  7.2*   CBC Recent Labs  Lab 08/24/23 1947 08/26/23 0424 08/26/23 1331  WBC 5.4 5.2  --   HGB 13.7 13.3 15.0  HCT 40.7 38.9* 44.0  MCV 86.4 84.9  --   PLT 305 295  --     Medications:     acetaminophen  1,000 mg Oral  TID   amLODipine  10 mg Oral Daily   carvedilol  12.5 mg Oral BID   Chlorhexidine Gluconate Cloth  6 each Topical Q0600   docusate sodium  100 mg Oral BID   doxercalciferol  1 mcg Intravenous Q M,W,F-HD   ferric citrate  420 mg Oral TID WC   gabapentin  300 mg Oral QHS   heparin injection (subcutaneous)  5,000 Units Subcutaneous Q8H   nicotine  21 mg Transdermal Daily   pantoprazole  40 mg Oral Daily   sevelamer carbonate  800 mg Oral TID WC      Anthony Sar, MD [facility] 08/27/2023, 10:41 AM

## 2023-08-28 ENCOUNTER — Telehealth (HOSPITAL_COMMUNITY): Payer: Self-pay | Admitting: Nephrology

## 2023-08-28 NOTE — Anesthesia Postprocedure Evaluation (Signed)
Anesthesia Post Note  Patient: Levi Hodges  Procedure(s) Performed: LAPAROSCOPIC CHOLECYSTECTOMY INDOCYANINE GREEN FLUORESCENCE IMAGING (ICG)     Patient location during evaluation: PACU Anesthesia Type: General Level of consciousness: awake and alert Pain management: pain level controlled Vital Signs Assessment: post-procedure vital signs reviewed and stable Respiratory status: spontaneous breathing, nonlabored ventilation, respiratory function stable and patient connected to nasal cannula oxygen Cardiovascular status: blood pressure returned to baseline and stable Postop Assessment: no apparent nausea or vomiting Anesthetic complications: no  No notable events documented.  Last Vitals:  Vitals:   08/27/23 0416 08/27/23 0747  BP: (!) 160/95 (!) 155/90  Pulse: 64 70  Resp: 20 18  Temp: 36.8 C 36.8 C  SpO2: 94% 97%    Last Pain:  Vitals:   08/27/23 0747  TempSrc: Oral  PainSc: 0-No pain   Pain Goal:                   Edyn Popoca L Madisson Kulaga

## 2023-08-28 NOTE — Telephone Encounter (Signed)
Transition of care contact from inpatient facility  Date of Discharge: 08/27/23 Date of Contact: 08/28/23 - attempt #1 Method of contact: Phone  Attempted to contact patient to discuss transition of care from inpatient admission. Patient did not answer the phone. Message was left on the patient's voicemail with call back number [phone].  Ozzie Hoyle, PA-C [facility] Pager [phone]   Addendum: Pt called back - reviewed hospitalization. He was feeling very weak and did not go to dialysis on Friday as prescribed. Suggested he do a make-up HD on Saturday, but he declined. Serosanguinous fluid from JP drain, but otherwise ok. KS

## 2023-08-28 NOTE — Discharge Planning (Signed)
[facility] Patient Discharge Orders - [facility] CLINIC: [facility]  Patient's name: Levi Hodges Admit/DC Dates: 08/24/2023 - 08/27/2023  DISCHARGE DIAGNOSES: Acute cholecystitis -> s/p lap cholestectomy 08/26/23  HTN - meds escalated and EDW lowered T2DM - ?was taking metformin - eek, d/c'd  HD ORDER CHANGES: Heparin change: hold heparin x 1 week s/p surgery, then resume prior dose EDW Change: yes New EDW: 58.5kg [place] Change: no, but repeat K next week  ANEMIA MANAGEMENT: Aranesp: Given: no   ESA dose for discharge: none, Hgb > 12 IV Iron dose at discharge: per protocol Transfusion: Given: no  BONE/MINERAL MEDICATIONS: Hectorol/Calcitriol change: no Sensipar/Parsabiv change: no  ACCESS INTERVENTION/CHANGE: no Details:   RECENT LABS: Recent Labs  Lab 08/26/23 1331 08/27/23 0848  HGB 15.0  --   NA 136 136  K 3.4* 3.8  CALCIUM  --  9.2  PHOS  --  7.2*  ALBUMIN  --  4.0    IV ANTIBIOTICS: no Details:  OTHER ANTICOAGULATION: On Coumadin?: no  OTHER/APPTS/LAB ORDERS:  - Check K within the week, may need 3K  - Still has JP drain, will f/u with surgery to have this removed   D/C Meds to be reconciled by nurse after every discharge.  Completed By: Ozzie Hoyle, PA-C [facility] Pager [phone]   Reviewed by: MD:______ RN_______

## 2023-08-31 DIAGNOSIS — Z992 Dependence on renal dialysis: Secondary | ICD-10-CM | POA: Diagnosis not present

## 2023-08-31 DIAGNOSIS — N186 End stage renal disease: Secondary | ICD-10-CM | POA: Diagnosis not present

## 2023-08-31 DIAGNOSIS — N2581 Secondary hyperparathyroidism of renal origin: Secondary | ICD-10-CM | POA: Diagnosis not present

## 2023-08-31 DIAGNOSIS — R110 Nausea: Secondary | ICD-10-CM | POA: Diagnosis not present

## 2023-08-31 DIAGNOSIS — R6883 Chills (without fever): Secondary | ICD-10-CM | POA: Diagnosis not present

## 2023-08-31 DIAGNOSIS — D689 Coagulation defect, unspecified: Secondary | ICD-10-CM | POA: Diagnosis not present

## 2023-08-31 DIAGNOSIS — D631 Anemia in chronic kidney disease: Secondary | ICD-10-CM | POA: Diagnosis not present

## 2023-08-31 DIAGNOSIS — R112 Nausea with vomiting, unspecified: Secondary | ICD-10-CM | POA: Diagnosis not present

## 2023-08-31 LAB — SURGICAL PATHOLOGY

## 2023-09-01 ENCOUNTER — Ambulatory Visit: Payer: 59 | Admitting: Internal Medicine

## 2023-09-02 DIAGNOSIS — N2581 Secondary hyperparathyroidism of renal origin: Secondary | ICD-10-CM | POA: Diagnosis not present

## 2023-09-02 DIAGNOSIS — D631 Anemia in chronic kidney disease: Secondary | ICD-10-CM | POA: Diagnosis not present

## 2023-09-02 DIAGNOSIS — R112 Nausea with vomiting, unspecified: Secondary | ICD-10-CM | POA: Diagnosis not present

## 2023-09-02 DIAGNOSIS — N186 End stage renal disease: Secondary | ICD-10-CM | POA: Diagnosis not present

## 2023-09-02 DIAGNOSIS — R110 Nausea: Secondary | ICD-10-CM | POA: Diagnosis not present

## 2023-09-02 DIAGNOSIS — R6883 Chills (without fever): Secondary | ICD-10-CM | POA: Diagnosis not present

## 2023-09-02 DIAGNOSIS — Z992 Dependence on renal dialysis: Secondary | ICD-10-CM | POA: Diagnosis not present

## 2023-09-02 DIAGNOSIS — D689 Coagulation defect, unspecified: Secondary | ICD-10-CM | POA: Diagnosis not present

## 2023-09-03 DIAGNOSIS — N186 End stage renal disease: Secondary | ICD-10-CM | POA: Diagnosis not present

## 2023-09-03 DIAGNOSIS — E1122 Type 2 diabetes mellitus with diabetic chronic kidney disease: Secondary | ICD-10-CM | POA: Diagnosis not present

## 2023-09-03 DIAGNOSIS — Z992 Dependence on renal dialysis: Secondary | ICD-10-CM | POA: Diagnosis not present

## 2023-09-04 DIAGNOSIS — N2581 Secondary hyperparathyroidism of renal origin: Secondary | ICD-10-CM | POA: Diagnosis not present

## 2023-09-04 DIAGNOSIS — E1122 Type 2 diabetes mellitus with diabetic chronic kidney disease: Secondary | ICD-10-CM | POA: Diagnosis not present

## 2023-09-04 DIAGNOSIS — R112 Nausea with vomiting, unspecified: Secondary | ICD-10-CM | POA: Diagnosis not present

## 2023-09-04 DIAGNOSIS — Z992 Dependence on renal dialysis: Secondary | ICD-10-CM | POA: Diagnosis not present

## 2023-09-04 DIAGNOSIS — D509 Iron deficiency anemia, unspecified: Secondary | ICD-10-CM | POA: Diagnosis not present

## 2023-09-04 DIAGNOSIS — D631 Anemia in chronic kidney disease: Secondary | ICD-10-CM | POA: Diagnosis not present

## 2023-09-04 DIAGNOSIS — D689 Coagulation defect, unspecified: Secondary | ICD-10-CM | POA: Diagnosis not present

## 2023-09-04 DIAGNOSIS — N186 End stage renal disease: Secondary | ICD-10-CM | POA: Diagnosis not present

## 2023-09-07 DIAGNOSIS — E1122 Type 2 diabetes mellitus with diabetic chronic kidney disease: Secondary | ICD-10-CM | POA: Diagnosis not present

## 2023-09-07 DIAGNOSIS — D631 Anemia in chronic kidney disease: Secondary | ICD-10-CM | POA: Diagnosis not present

## 2023-09-07 DIAGNOSIS — D689 Coagulation defect, unspecified: Secondary | ICD-10-CM | POA: Diagnosis not present

## 2023-09-07 DIAGNOSIS — N2581 Secondary hyperparathyroidism of renal origin: Secondary | ICD-10-CM | POA: Diagnosis not present

## 2023-09-07 DIAGNOSIS — Z992 Dependence on renal dialysis: Secondary | ICD-10-CM | POA: Diagnosis not present

## 2023-09-07 DIAGNOSIS — R112 Nausea with vomiting, unspecified: Secondary | ICD-10-CM | POA: Diagnosis not present

## 2023-09-07 DIAGNOSIS — D509 Iron deficiency anemia, unspecified: Secondary | ICD-10-CM | POA: Diagnosis not present

## 2023-09-07 DIAGNOSIS — N186 End stage renal disease: Secondary | ICD-10-CM | POA: Diagnosis not present

## 2023-09-09 DIAGNOSIS — E1122 Type 2 diabetes mellitus with diabetic chronic kidney disease: Secondary | ICD-10-CM | POA: Diagnosis not present

## 2023-09-09 DIAGNOSIS — N186 End stage renal disease: Secondary | ICD-10-CM | POA: Diagnosis not present

## 2023-09-09 DIAGNOSIS — D509 Iron deficiency anemia, unspecified: Secondary | ICD-10-CM | POA: Diagnosis not present

## 2023-09-09 DIAGNOSIS — D689 Coagulation defect, unspecified: Secondary | ICD-10-CM | POA: Diagnosis not present

## 2023-09-09 DIAGNOSIS — D631 Anemia in chronic kidney disease: Secondary | ICD-10-CM | POA: Diagnosis not present

## 2023-09-09 DIAGNOSIS — Z992 Dependence on renal dialysis: Secondary | ICD-10-CM | POA: Diagnosis not present

## 2023-09-09 DIAGNOSIS — N2581 Secondary hyperparathyroidism of renal origin: Secondary | ICD-10-CM | POA: Diagnosis not present

## 2023-09-09 DIAGNOSIS — R112 Nausea with vomiting, unspecified: Secondary | ICD-10-CM | POA: Diagnosis not present

## 2023-09-11 DIAGNOSIS — R112 Nausea with vomiting, unspecified: Secondary | ICD-10-CM | POA: Diagnosis not present

## 2023-09-11 DIAGNOSIS — D509 Iron deficiency anemia, unspecified: Secondary | ICD-10-CM | POA: Diagnosis not present

## 2023-09-11 DIAGNOSIS — N186 End stage renal disease: Secondary | ICD-10-CM | POA: Diagnosis not present

## 2023-09-11 DIAGNOSIS — D631 Anemia in chronic kidney disease: Secondary | ICD-10-CM | POA: Diagnosis not present

## 2023-09-11 DIAGNOSIS — D689 Coagulation defect, unspecified: Secondary | ICD-10-CM | POA: Diagnosis not present

## 2023-09-11 DIAGNOSIS — E1122 Type 2 diabetes mellitus with diabetic chronic kidney disease: Secondary | ICD-10-CM | POA: Diagnosis not present

## 2023-09-11 DIAGNOSIS — N2581 Secondary hyperparathyroidism of renal origin: Secondary | ICD-10-CM | POA: Diagnosis not present

## 2023-09-11 DIAGNOSIS — Z992 Dependence on renal dialysis: Secondary | ICD-10-CM | POA: Diagnosis not present

## 2023-09-14 DIAGNOSIS — D509 Iron deficiency anemia, unspecified: Secondary | ICD-10-CM | POA: Diagnosis not present

## 2023-09-14 DIAGNOSIS — D689 Coagulation defect, unspecified: Secondary | ICD-10-CM | POA: Diagnosis not present

## 2023-09-14 DIAGNOSIS — D631 Anemia in chronic kidney disease: Secondary | ICD-10-CM | POA: Diagnosis not present

## 2023-09-14 DIAGNOSIS — N2581 Secondary hyperparathyroidism of renal origin: Secondary | ICD-10-CM | POA: Diagnosis not present

## 2023-09-14 DIAGNOSIS — E1122 Type 2 diabetes mellitus with diabetic chronic kidney disease: Secondary | ICD-10-CM | POA: Diagnosis not present

## 2023-09-14 DIAGNOSIS — R112 Nausea with vomiting, unspecified: Secondary | ICD-10-CM | POA: Diagnosis not present

## 2023-09-14 DIAGNOSIS — Z992 Dependence on renal dialysis: Secondary | ICD-10-CM | POA: Diagnosis not present

## 2023-09-14 DIAGNOSIS — N186 End stage renal disease: Secondary | ICD-10-CM | POA: Diagnosis not present

## 2023-09-18 DIAGNOSIS — Z992 Dependence on renal dialysis: Secondary | ICD-10-CM | POA: Diagnosis not present

## 2023-09-18 DIAGNOSIS — R112 Nausea with vomiting, unspecified: Secondary | ICD-10-CM | POA: Diagnosis not present

## 2023-09-18 DIAGNOSIS — N2581 Secondary hyperparathyroidism of renal origin: Secondary | ICD-10-CM | POA: Diagnosis not present

## 2023-09-18 DIAGNOSIS — D689 Coagulation defect, unspecified: Secondary | ICD-10-CM | POA: Diagnosis not present

## 2023-09-18 DIAGNOSIS — N186 End stage renal disease: Secondary | ICD-10-CM | POA: Diagnosis not present

## 2023-09-18 DIAGNOSIS — D509 Iron deficiency anemia, unspecified: Secondary | ICD-10-CM | POA: Diagnosis not present

## 2023-09-18 DIAGNOSIS — E1122 Type 2 diabetes mellitus with diabetic chronic kidney disease: Secondary | ICD-10-CM | POA: Diagnosis not present

## 2023-09-18 DIAGNOSIS — D631 Anemia in chronic kidney disease: Secondary | ICD-10-CM | POA: Diagnosis not present

## 2023-09-21 ENCOUNTER — Telehealth: Payer: Self-pay

## 2023-09-21 DIAGNOSIS — D689 Coagulation defect, unspecified: Secondary | ICD-10-CM | POA: Diagnosis not present

## 2023-09-21 DIAGNOSIS — R112 Nausea with vomiting, unspecified: Secondary | ICD-10-CM | POA: Diagnosis not present

## 2023-09-21 DIAGNOSIS — D509 Iron deficiency anemia, unspecified: Secondary | ICD-10-CM | POA: Diagnosis not present

## 2023-09-21 DIAGNOSIS — E1122 Type 2 diabetes mellitus with diabetic chronic kidney disease: Secondary | ICD-10-CM | POA: Diagnosis not present

## 2023-09-21 DIAGNOSIS — D631 Anemia in chronic kidney disease: Secondary | ICD-10-CM | POA: Diagnosis not present

## 2023-09-21 DIAGNOSIS — N186 End stage renal disease: Secondary | ICD-10-CM | POA: Diagnosis not present

## 2023-09-21 DIAGNOSIS — Z992 Dependence on renal dialysis: Secondary | ICD-10-CM | POA: Diagnosis not present

## 2023-09-21 DIAGNOSIS — N2581 Secondary hyperparathyroidism of renal origin: Secondary | ICD-10-CM | POA: Diagnosis not present

## 2023-09-21 NOTE — Patient Outreach (Signed)
Attempted to contact patient regarding AWV and BMP. Left voicemail for patient to return my call at [phone].  Nicholes Rough, CMA Care Guide [employer]

## 2023-09-23 DIAGNOSIS — D631 Anemia in chronic kidney disease: Secondary | ICD-10-CM | POA: Diagnosis not present

## 2023-09-23 DIAGNOSIS — N186 End stage renal disease: Secondary | ICD-10-CM | POA: Diagnosis not present

## 2023-09-23 DIAGNOSIS — N2581 Secondary hyperparathyroidism of renal origin: Secondary | ICD-10-CM | POA: Diagnosis not present

## 2023-09-23 DIAGNOSIS — R112 Nausea with vomiting, unspecified: Secondary | ICD-10-CM | POA: Diagnosis not present

## 2023-09-23 DIAGNOSIS — Z992 Dependence on renal dialysis: Secondary | ICD-10-CM | POA: Diagnosis not present

## 2023-09-23 DIAGNOSIS — D689 Coagulation defect, unspecified: Secondary | ICD-10-CM | POA: Diagnosis not present

## 2023-09-23 DIAGNOSIS — E1122 Type 2 diabetes mellitus with diabetic chronic kidney disease: Secondary | ICD-10-CM | POA: Diagnosis not present

## 2023-09-23 DIAGNOSIS — D509 Iron deficiency anemia, unspecified: Secondary | ICD-10-CM | POA: Diagnosis not present

## 2023-09-25 DIAGNOSIS — E1122 Type 2 diabetes mellitus with diabetic chronic kidney disease: Secondary | ICD-10-CM | POA: Diagnosis not present

## 2023-09-25 DIAGNOSIS — D689 Coagulation defect, unspecified: Secondary | ICD-10-CM | POA: Diagnosis not present

## 2023-09-25 DIAGNOSIS — R112 Nausea with vomiting, unspecified: Secondary | ICD-10-CM | POA: Diagnosis not present

## 2023-09-25 DIAGNOSIS — D631 Anemia in chronic kidney disease: Secondary | ICD-10-CM | POA: Diagnosis not present

## 2023-09-25 DIAGNOSIS — N2581 Secondary hyperparathyroidism of renal origin: Secondary | ICD-10-CM | POA: Diagnosis not present

## 2023-09-25 DIAGNOSIS — N186 End stage renal disease: Secondary | ICD-10-CM | POA: Diagnosis not present

## 2023-09-25 DIAGNOSIS — Z992 Dependence on renal dialysis: Secondary | ICD-10-CM | POA: Diagnosis not present

## 2023-09-25 DIAGNOSIS — D509 Iron deficiency anemia, unspecified: Secondary | ICD-10-CM | POA: Diagnosis not present

## 2023-09-29 DIAGNOSIS — D689 Coagulation defect, unspecified: Secondary | ICD-10-CM | POA: Diagnosis not present

## 2023-09-29 DIAGNOSIS — R112 Nausea with vomiting, unspecified: Secondary | ICD-10-CM | POA: Diagnosis not present

## 2023-09-29 DIAGNOSIS — N186 End stage renal disease: Secondary | ICD-10-CM | POA: Diagnosis not present

## 2023-09-29 DIAGNOSIS — N2581 Secondary hyperparathyroidism of renal origin: Secondary | ICD-10-CM | POA: Diagnosis not present

## 2023-09-29 DIAGNOSIS — Z992 Dependence on renal dialysis: Secondary | ICD-10-CM | POA: Diagnosis not present

## 2023-09-29 DIAGNOSIS — E1122 Type 2 diabetes mellitus with diabetic chronic kidney disease: Secondary | ICD-10-CM | POA: Diagnosis not present

## 2023-09-29 DIAGNOSIS — D509 Iron deficiency anemia, unspecified: Secondary | ICD-10-CM | POA: Diagnosis not present

## 2023-09-29 DIAGNOSIS — D631 Anemia in chronic kidney disease: Secondary | ICD-10-CM | POA: Diagnosis not present

## 2023-09-30 ENCOUNTER — Other Ambulatory Visit: Payer: Self-pay | Admitting: Internal Medicine

## 2023-10-02 DIAGNOSIS — N186 End stage renal disease: Secondary | ICD-10-CM | POA: Diagnosis not present

## 2023-10-02 DIAGNOSIS — E1122 Type 2 diabetes mellitus with diabetic chronic kidney disease: Secondary | ICD-10-CM | POA: Diagnosis not present

## 2023-10-02 DIAGNOSIS — D509 Iron deficiency anemia, unspecified: Secondary | ICD-10-CM | POA: Diagnosis not present

## 2023-10-02 DIAGNOSIS — R112 Nausea with vomiting, unspecified: Secondary | ICD-10-CM | POA: Diagnosis not present

## 2023-10-02 DIAGNOSIS — D631 Anemia in chronic kidney disease: Secondary | ICD-10-CM | POA: Diagnosis not present

## 2023-10-02 DIAGNOSIS — N2581 Secondary hyperparathyroidism of renal origin: Secondary | ICD-10-CM | POA: Diagnosis not present

## 2023-10-02 DIAGNOSIS — Z992 Dependence on renal dialysis: Secondary | ICD-10-CM | POA: Diagnosis not present

## 2023-10-02 DIAGNOSIS — D689 Coagulation defect, unspecified: Secondary | ICD-10-CM | POA: Diagnosis not present

## 2023-10-03 DIAGNOSIS — Z992 Dependence on renal dialysis: Secondary | ICD-10-CM | POA: Diagnosis not present

## 2023-10-03 DIAGNOSIS — E1122 Type 2 diabetes mellitus with diabetic chronic kidney disease: Secondary | ICD-10-CM | POA: Diagnosis not present

## 2023-10-03 DIAGNOSIS — N186 End stage renal disease: Secondary | ICD-10-CM | POA: Diagnosis not present

## 2023-10-05 ENCOUNTER — Other Ambulatory Visit: Payer: Self-pay

## 2023-10-05 ENCOUNTER — Encounter: Payer: Self-pay | Admitting: Internal Medicine

## 2023-10-05 DIAGNOSIS — D631 Anemia in chronic kidney disease: Secondary | ICD-10-CM | POA: Diagnosis not present

## 2023-10-05 DIAGNOSIS — N186 End stage renal disease: Secondary | ICD-10-CM | POA: Diagnosis not present

## 2023-10-05 DIAGNOSIS — E1122 Type 2 diabetes mellitus with diabetic chronic kidney disease: Secondary | ICD-10-CM | POA: Diagnosis not present

## 2023-10-05 DIAGNOSIS — Z992 Dependence on renal dialysis: Secondary | ICD-10-CM | POA: Diagnosis not present

## 2023-10-05 DIAGNOSIS — D689 Coagulation defect, unspecified: Secondary | ICD-10-CM | POA: Diagnosis not present

## 2023-10-05 DIAGNOSIS — D509 Iron deficiency anemia, unspecified: Secondary | ICD-10-CM | POA: Diagnosis not present

## 2023-10-05 DIAGNOSIS — N2581 Secondary hyperparathyroidism of renal origin: Secondary | ICD-10-CM | POA: Diagnosis not present

## 2023-10-05 NOTE — Telephone Encounter (Signed)
It will not let me refill

## 2023-10-06 NOTE — Telephone Encounter (Signed)
I do not see any strips on his list you will need to verify which meter and then okay to send in strips for that meter.

## 2023-10-07 DIAGNOSIS — E1122 Type 2 diabetes mellitus with diabetic chronic kidney disease: Secondary | ICD-10-CM | POA: Diagnosis not present

## 2023-10-07 DIAGNOSIS — N2581 Secondary hyperparathyroidism of renal origin: Secondary | ICD-10-CM | POA: Diagnosis not present

## 2023-10-07 DIAGNOSIS — D509 Iron deficiency anemia, unspecified: Secondary | ICD-10-CM | POA: Diagnosis not present

## 2023-10-07 DIAGNOSIS — D631 Anemia in chronic kidney disease: Secondary | ICD-10-CM | POA: Diagnosis not present

## 2023-10-07 DIAGNOSIS — Z992 Dependence on renal dialysis: Secondary | ICD-10-CM | POA: Diagnosis not present

## 2023-10-07 DIAGNOSIS — N186 End stage renal disease: Secondary | ICD-10-CM | POA: Diagnosis not present

## 2023-10-07 DIAGNOSIS — D689 Coagulation defect, unspecified: Secondary | ICD-10-CM | POA: Diagnosis not present

## 2023-10-07 MED ORDER — GLUCOSE BLOOD VI STRP: ORAL_STRIP | 12 refills | Status: DC

## 2023-10-09 DIAGNOSIS — N186 End stage renal disease: Secondary | ICD-10-CM | POA: Diagnosis not present

## 2023-10-09 DIAGNOSIS — Z992 Dependence on renal dialysis: Secondary | ICD-10-CM | POA: Diagnosis not present

## 2023-10-09 DIAGNOSIS — D509 Iron deficiency anemia, unspecified: Secondary | ICD-10-CM | POA: Diagnosis not present

## 2023-10-09 DIAGNOSIS — D689 Coagulation defect, unspecified: Secondary | ICD-10-CM | POA: Diagnosis not present

## 2023-10-09 DIAGNOSIS — D631 Anemia in chronic kidney disease: Secondary | ICD-10-CM | POA: Diagnosis not present

## 2023-10-09 DIAGNOSIS — E1122 Type 2 diabetes mellitus with diabetic chronic kidney disease: Secondary | ICD-10-CM | POA: Diagnosis not present

## 2023-10-09 DIAGNOSIS — N2581 Secondary hyperparathyroidism of renal origin: Secondary | ICD-10-CM | POA: Diagnosis not present

## 2023-10-12 DIAGNOSIS — N2581 Secondary hyperparathyroidism of renal origin: Secondary | ICD-10-CM | POA: Diagnosis not present

## 2023-10-12 DIAGNOSIS — N186 End stage renal disease: Secondary | ICD-10-CM | POA: Diagnosis not present

## 2023-10-12 DIAGNOSIS — E1122 Type 2 diabetes mellitus with diabetic chronic kidney disease: Secondary | ICD-10-CM | POA: Diagnosis not present

## 2023-10-12 DIAGNOSIS — D631 Anemia in chronic kidney disease: Secondary | ICD-10-CM | POA: Diagnosis not present

## 2023-10-12 DIAGNOSIS — Z992 Dependence on renal dialysis: Secondary | ICD-10-CM | POA: Diagnosis not present

## 2023-10-12 DIAGNOSIS — D509 Iron deficiency anemia, unspecified: Secondary | ICD-10-CM | POA: Diagnosis not present

## 2023-10-12 DIAGNOSIS — D689 Coagulation defect, unspecified: Secondary | ICD-10-CM | POA: Diagnosis not present

## 2023-10-14 DIAGNOSIS — N186 End stage renal disease: Secondary | ICD-10-CM | POA: Diagnosis not present

## 2023-10-14 DIAGNOSIS — N2581 Secondary hyperparathyroidism of renal origin: Secondary | ICD-10-CM | POA: Diagnosis not present

## 2023-10-14 DIAGNOSIS — D509 Iron deficiency anemia, unspecified: Secondary | ICD-10-CM | POA: Diagnosis not present

## 2023-10-14 DIAGNOSIS — D689 Coagulation defect, unspecified: Secondary | ICD-10-CM | POA: Diagnosis not present

## 2023-10-14 DIAGNOSIS — E1122 Type 2 diabetes mellitus with diabetic chronic kidney disease: Secondary | ICD-10-CM | POA: Diagnosis not present

## 2023-10-14 DIAGNOSIS — Z992 Dependence on renal dialysis: Secondary | ICD-10-CM | POA: Diagnosis not present

## 2023-10-14 DIAGNOSIS — D631 Anemia in chronic kidney disease: Secondary | ICD-10-CM | POA: Diagnosis not present

## 2023-10-16 DIAGNOSIS — D509 Iron deficiency anemia, unspecified: Secondary | ICD-10-CM | POA: Diagnosis not present

## 2023-10-16 DIAGNOSIS — Z992 Dependence on renal dialysis: Secondary | ICD-10-CM | POA: Diagnosis not present

## 2023-10-16 DIAGNOSIS — D689 Coagulation defect, unspecified: Secondary | ICD-10-CM | POA: Diagnosis not present

## 2023-10-16 DIAGNOSIS — N2581 Secondary hyperparathyroidism of renal origin: Secondary | ICD-10-CM | POA: Diagnosis not present

## 2023-10-16 DIAGNOSIS — N186 End stage renal disease: Secondary | ICD-10-CM | POA: Diagnosis not present

## 2023-10-16 DIAGNOSIS — D631 Anemia in chronic kidney disease: Secondary | ICD-10-CM | POA: Diagnosis not present

## 2023-10-16 DIAGNOSIS — E1122 Type 2 diabetes mellitus with diabetic chronic kidney disease: Secondary | ICD-10-CM | POA: Diagnosis not present

## 2023-10-21 DIAGNOSIS — N186 End stage renal disease: Secondary | ICD-10-CM | POA: Diagnosis not present

## 2023-10-21 DIAGNOSIS — E1122 Type 2 diabetes mellitus with diabetic chronic kidney disease: Secondary | ICD-10-CM | POA: Diagnosis not present

## 2023-10-21 DIAGNOSIS — D631 Anemia in chronic kidney disease: Secondary | ICD-10-CM | POA: Diagnosis not present

## 2023-10-21 DIAGNOSIS — Z992 Dependence on renal dialysis: Secondary | ICD-10-CM | POA: Diagnosis not present

## 2023-10-21 DIAGNOSIS — D689 Coagulation defect, unspecified: Secondary | ICD-10-CM | POA: Diagnosis not present

## 2023-10-21 DIAGNOSIS — N2581 Secondary hyperparathyroidism of renal origin: Secondary | ICD-10-CM | POA: Diagnosis not present

## 2023-10-21 DIAGNOSIS — D509 Iron deficiency anemia, unspecified: Secondary | ICD-10-CM | POA: Diagnosis not present

## 2023-10-23 DIAGNOSIS — N2581 Secondary hyperparathyroidism of renal origin: Secondary | ICD-10-CM | POA: Diagnosis not present

## 2023-10-23 DIAGNOSIS — N186 End stage renal disease: Secondary | ICD-10-CM | POA: Diagnosis not present

## 2023-10-23 DIAGNOSIS — Z992 Dependence on renal dialysis: Secondary | ICD-10-CM | POA: Diagnosis not present

## 2023-10-23 DIAGNOSIS — D631 Anemia in chronic kidney disease: Secondary | ICD-10-CM | POA: Diagnosis not present

## 2023-10-23 DIAGNOSIS — D509 Iron deficiency anemia, unspecified: Secondary | ICD-10-CM | POA: Diagnosis not present

## 2023-10-23 DIAGNOSIS — E1122 Type 2 diabetes mellitus with diabetic chronic kidney disease: Secondary | ICD-10-CM | POA: Diagnosis not present

## 2023-10-23 DIAGNOSIS — D689 Coagulation defect, unspecified: Secondary | ICD-10-CM | POA: Diagnosis not present

## 2023-10-27 DIAGNOSIS — N186 End stage renal disease: Secondary | ICD-10-CM | POA: Diagnosis not present

## 2023-10-27 DIAGNOSIS — N2581 Secondary hyperparathyroidism of renal origin: Secondary | ICD-10-CM | POA: Diagnosis not present

## 2023-10-27 DIAGNOSIS — D509 Iron deficiency anemia, unspecified: Secondary | ICD-10-CM | POA: Diagnosis not present

## 2023-10-27 DIAGNOSIS — D689 Coagulation defect, unspecified: Secondary | ICD-10-CM | POA: Diagnosis not present

## 2023-10-27 DIAGNOSIS — E1122 Type 2 diabetes mellitus with diabetic chronic kidney disease: Secondary | ICD-10-CM | POA: Diagnosis not present

## 2023-10-27 DIAGNOSIS — Z992 Dependence on renal dialysis: Secondary | ICD-10-CM | POA: Diagnosis not present

## 2023-10-27 DIAGNOSIS — D631 Anemia in chronic kidney disease: Secondary | ICD-10-CM | POA: Diagnosis not present

## 2023-10-30 DIAGNOSIS — N186 End stage renal disease: Secondary | ICD-10-CM | POA: Diagnosis not present

## 2023-10-30 DIAGNOSIS — D509 Iron deficiency anemia, unspecified: Secondary | ICD-10-CM | POA: Diagnosis not present

## 2023-10-30 DIAGNOSIS — Z992 Dependence on renal dialysis: Secondary | ICD-10-CM | POA: Diagnosis not present

## 2023-10-30 DIAGNOSIS — D689 Coagulation defect, unspecified: Secondary | ICD-10-CM | POA: Diagnosis not present

## 2023-10-30 DIAGNOSIS — E1122 Type 2 diabetes mellitus with diabetic chronic kidney disease: Secondary | ICD-10-CM | POA: Diagnosis not present

## 2023-10-30 DIAGNOSIS — D631 Anemia in chronic kidney disease: Secondary | ICD-10-CM | POA: Diagnosis not present

## 2023-10-30 DIAGNOSIS — N2581 Secondary hyperparathyroidism of renal origin: Secondary | ICD-10-CM | POA: Diagnosis not present

## 2023-11-03 DIAGNOSIS — N2581 Secondary hyperparathyroidism of renal origin: Secondary | ICD-10-CM | POA: Diagnosis not present

## 2023-11-03 DIAGNOSIS — D509 Iron deficiency anemia, unspecified: Secondary | ICD-10-CM | POA: Diagnosis not present

## 2023-11-03 DIAGNOSIS — E1122 Type 2 diabetes mellitus with diabetic chronic kidney disease: Secondary | ICD-10-CM | POA: Diagnosis not present

## 2023-11-03 DIAGNOSIS — Z992 Dependence on renal dialysis: Secondary | ICD-10-CM | POA: Diagnosis not present

## 2023-11-03 DIAGNOSIS — D689 Coagulation defect, unspecified: Secondary | ICD-10-CM | POA: Diagnosis not present

## 2023-11-03 DIAGNOSIS — D631 Anemia in chronic kidney disease: Secondary | ICD-10-CM | POA: Diagnosis not present

## 2023-11-03 DIAGNOSIS — N186 End stage renal disease: Secondary | ICD-10-CM | POA: Diagnosis not present

## 2023-11-06 DIAGNOSIS — D631 Anemia in chronic kidney disease: Secondary | ICD-10-CM | POA: Diagnosis not present

## 2023-11-06 DIAGNOSIS — E1122 Type 2 diabetes mellitus with diabetic chronic kidney disease: Secondary | ICD-10-CM | POA: Diagnosis not present

## 2023-11-06 DIAGNOSIS — N186 End stage renal disease: Secondary | ICD-10-CM | POA: Diagnosis not present

## 2023-11-06 DIAGNOSIS — D509 Iron deficiency anemia, unspecified: Secondary | ICD-10-CM | POA: Diagnosis not present

## 2023-11-06 DIAGNOSIS — Z992 Dependence on renal dialysis: Secondary | ICD-10-CM | POA: Diagnosis not present

## 2023-11-06 DIAGNOSIS — N2581 Secondary hyperparathyroidism of renal origin: Secondary | ICD-10-CM | POA: Diagnosis not present

## 2023-11-06 DIAGNOSIS — D689 Coagulation defect, unspecified: Secondary | ICD-10-CM | POA: Diagnosis not present

## 2023-11-09 DIAGNOSIS — E1122 Type 2 diabetes mellitus with diabetic chronic kidney disease: Secondary | ICD-10-CM | POA: Diagnosis not present

## 2023-11-09 DIAGNOSIS — Z992 Dependence on renal dialysis: Secondary | ICD-10-CM | POA: Diagnosis not present

## 2023-11-09 DIAGNOSIS — N186 End stage renal disease: Secondary | ICD-10-CM | POA: Diagnosis not present

## 2023-11-09 DIAGNOSIS — N2581 Secondary hyperparathyroidism of renal origin: Secondary | ICD-10-CM | POA: Diagnosis not present

## 2023-11-09 DIAGNOSIS — D631 Anemia in chronic kidney disease: Secondary | ICD-10-CM | POA: Diagnosis not present

## 2023-11-09 DIAGNOSIS — D509 Iron deficiency anemia, unspecified: Secondary | ICD-10-CM | POA: Diagnosis not present

## 2023-11-09 DIAGNOSIS — D689 Coagulation defect, unspecified: Secondary | ICD-10-CM | POA: Diagnosis not present

## 2023-11-11 DIAGNOSIS — D631 Anemia in chronic kidney disease: Secondary | ICD-10-CM | POA: Diagnosis not present

## 2023-11-11 DIAGNOSIS — Z992 Dependence on renal dialysis: Secondary | ICD-10-CM | POA: Diagnosis not present

## 2023-11-11 DIAGNOSIS — D689 Coagulation defect, unspecified: Secondary | ICD-10-CM | POA: Diagnosis not present

## 2023-11-11 DIAGNOSIS — E1122 Type 2 diabetes mellitus with diabetic chronic kidney disease: Secondary | ICD-10-CM | POA: Diagnosis not present

## 2023-11-11 DIAGNOSIS — D509 Iron deficiency anemia, unspecified: Secondary | ICD-10-CM | POA: Diagnosis not present

## 2023-11-11 DIAGNOSIS — N2581 Secondary hyperparathyroidism of renal origin: Secondary | ICD-10-CM | POA: Diagnosis not present

## 2023-11-11 DIAGNOSIS — N186 End stage renal disease: Secondary | ICD-10-CM | POA: Diagnosis not present

## 2023-11-12 ENCOUNTER — Encounter: Payer: Self-pay | Admitting: Podiatry

## 2023-11-12 ENCOUNTER — Ambulatory Visit (INDEPENDENT_AMBULATORY_CARE_PROVIDER_SITE_OTHER): Payer: 59 | Admitting: Podiatry

## 2023-11-12 DIAGNOSIS — B351 Tinea unguium: Secondary | ICD-10-CM | POA: Diagnosis not present

## 2023-11-12 DIAGNOSIS — M79675 Pain in left toe(s): Secondary | ICD-10-CM | POA: Diagnosis not present

## 2023-11-12 DIAGNOSIS — M79674 Pain in right toe(s): Secondary | ICD-10-CM

## 2023-11-12 DIAGNOSIS — N186 End stage renal disease: Secondary | ICD-10-CM

## 2023-11-12 NOTE — Progress Notes (Signed)
 This patient returns to my office for at risk foot care.  This patient requires this care by a professional since this patient will be at risk due to having ESRD.  This patient is unable to cut nails himself since the patient cannot reach his nails.These nails are painful walking and wearing shoes.  This patient presents for at risk foot care today.  General Appearance  Alert, conversant and in no acute stress.  Vascular  Dorsalis pedis and posterior tibial  pulses are palpable  bilaterally.  Capillary return is within normal limits  bilaterally. Temperature is within normal limits  bilaterally.  Neurologic  Senn-Weinstein monofilament wire test within normal limits  bilaterally. Muscle power within normal limits bilaterally.  Nails Thick disfigured discolored nails with subungual debris  from hallux to fifth toes bilaterally. No evidence of bacterial infection or drainage bilaterally.  Orthopedic  No limitations of motion  feet .  No crepitus or effusions noted. HAV  B/L.   Skin  normotropic skin with no porokeratosis noted bilaterally.  No signs of infections or ulcers noted.     Onychomycosis  Pain in right toes  Pain in left toes  Consent was obtained for treatment procedures.   Mechanical debridement of nails 1-5  bilaterally performed with a nail nipper.  Filed with dremel without incident.    Return office visit                     Told patient to return for periodic foot care and evaluation due to potential at risk complications.   Helane Gunther DPM

## 2023-11-13 DIAGNOSIS — D689 Coagulation defect, unspecified: Secondary | ICD-10-CM | POA: Diagnosis not present

## 2023-11-13 DIAGNOSIS — D509 Iron deficiency anemia, unspecified: Secondary | ICD-10-CM | POA: Diagnosis not present

## 2023-11-13 DIAGNOSIS — N2581 Secondary hyperparathyroidism of renal origin: Secondary | ICD-10-CM | POA: Diagnosis not present

## 2023-11-13 DIAGNOSIS — Z992 Dependence on renal dialysis: Secondary | ICD-10-CM | POA: Diagnosis not present

## 2023-11-13 DIAGNOSIS — N186 End stage renal disease: Secondary | ICD-10-CM | POA: Diagnosis not present

## 2023-11-13 DIAGNOSIS — E1122 Type 2 diabetes mellitus with diabetic chronic kidney disease: Secondary | ICD-10-CM | POA: Diagnosis not present

## 2023-11-13 DIAGNOSIS — D631 Anemia in chronic kidney disease: Secondary | ICD-10-CM | POA: Diagnosis not present

## 2023-11-16 DIAGNOSIS — D509 Iron deficiency anemia, unspecified: Secondary | ICD-10-CM | POA: Diagnosis not present

## 2023-11-16 DIAGNOSIS — D689 Coagulation defect, unspecified: Secondary | ICD-10-CM | POA: Diagnosis not present

## 2023-11-16 DIAGNOSIS — D631 Anemia in chronic kidney disease: Secondary | ICD-10-CM | POA: Diagnosis not present

## 2023-11-16 DIAGNOSIS — N186 End stage renal disease: Secondary | ICD-10-CM | POA: Diagnosis not present

## 2023-11-16 DIAGNOSIS — E1122 Type 2 diabetes mellitus with diabetic chronic kidney disease: Secondary | ICD-10-CM | POA: Diagnosis not present

## 2023-11-16 DIAGNOSIS — Z992 Dependence on renal dialysis: Secondary | ICD-10-CM | POA: Diagnosis not present

## 2023-11-16 DIAGNOSIS — N2581 Secondary hyperparathyroidism of renal origin: Secondary | ICD-10-CM | POA: Diagnosis not present

## 2023-11-18 DIAGNOSIS — D631 Anemia in chronic kidney disease: Secondary | ICD-10-CM | POA: Diagnosis not present

## 2023-11-18 DIAGNOSIS — D689 Coagulation defect, unspecified: Secondary | ICD-10-CM | POA: Diagnosis not present

## 2023-11-18 DIAGNOSIS — D509 Iron deficiency anemia, unspecified: Secondary | ICD-10-CM | POA: Diagnosis not present

## 2023-11-18 DIAGNOSIS — N2581 Secondary hyperparathyroidism of renal origin: Secondary | ICD-10-CM | POA: Diagnosis not present

## 2023-11-18 DIAGNOSIS — N186 End stage renal disease: Secondary | ICD-10-CM | POA: Diagnosis not present

## 2023-11-18 DIAGNOSIS — Z992 Dependence on renal dialysis: Secondary | ICD-10-CM | POA: Diagnosis not present

## 2023-11-18 DIAGNOSIS — E1122 Type 2 diabetes mellitus with diabetic chronic kidney disease: Secondary | ICD-10-CM | POA: Diagnosis not present

## 2023-11-20 DIAGNOSIS — D689 Coagulation defect, unspecified: Secondary | ICD-10-CM | POA: Diagnosis not present

## 2023-11-20 DIAGNOSIS — E1122 Type 2 diabetes mellitus with diabetic chronic kidney disease: Secondary | ICD-10-CM | POA: Diagnosis not present

## 2023-11-20 DIAGNOSIS — N186 End stage renal disease: Secondary | ICD-10-CM | POA: Diagnosis not present

## 2023-11-20 DIAGNOSIS — N2581 Secondary hyperparathyroidism of renal origin: Secondary | ICD-10-CM | POA: Diagnosis not present

## 2023-11-20 DIAGNOSIS — D509 Iron deficiency anemia, unspecified: Secondary | ICD-10-CM | POA: Diagnosis not present

## 2023-11-20 DIAGNOSIS — Z992 Dependence on renal dialysis: Secondary | ICD-10-CM | POA: Diagnosis not present

## 2023-11-20 DIAGNOSIS — D631 Anemia in chronic kidney disease: Secondary | ICD-10-CM | POA: Diagnosis not present

## 2023-11-23 DIAGNOSIS — E1122 Type 2 diabetes mellitus with diabetic chronic kidney disease: Secondary | ICD-10-CM | POA: Diagnosis not present

## 2023-11-23 DIAGNOSIS — N186 End stage renal disease: Secondary | ICD-10-CM | POA: Diagnosis not present

## 2023-11-23 DIAGNOSIS — Z992 Dependence on renal dialysis: Secondary | ICD-10-CM | POA: Diagnosis not present

## 2023-11-23 DIAGNOSIS — D689 Coagulation defect, unspecified: Secondary | ICD-10-CM | POA: Diagnosis not present

## 2023-11-23 DIAGNOSIS — D631 Anemia in chronic kidney disease: Secondary | ICD-10-CM | POA: Diagnosis not present

## 2023-11-23 DIAGNOSIS — N2581 Secondary hyperparathyroidism of renal origin: Secondary | ICD-10-CM | POA: Diagnosis not present

## 2023-11-23 DIAGNOSIS — D509 Iron deficiency anemia, unspecified: Secondary | ICD-10-CM | POA: Diagnosis not present

## 2023-11-27 DIAGNOSIS — N2581 Secondary hyperparathyroidism of renal origin: Secondary | ICD-10-CM | POA: Diagnosis not present

## 2023-11-27 DIAGNOSIS — N186 End stage renal disease: Secondary | ICD-10-CM | POA: Diagnosis not present

## 2023-11-27 DIAGNOSIS — D631 Anemia in chronic kidney disease: Secondary | ICD-10-CM | POA: Diagnosis not present

## 2023-11-27 DIAGNOSIS — D689 Coagulation defect, unspecified: Secondary | ICD-10-CM | POA: Diagnosis not present

## 2023-11-27 DIAGNOSIS — D509 Iron deficiency anemia, unspecified: Secondary | ICD-10-CM | POA: Diagnosis not present

## 2023-11-27 DIAGNOSIS — Z992 Dependence on renal dialysis: Secondary | ICD-10-CM | POA: Diagnosis not present

## 2023-11-27 DIAGNOSIS — E1122 Type 2 diabetes mellitus with diabetic chronic kidney disease: Secondary | ICD-10-CM | POA: Diagnosis not present

## 2023-11-30 DIAGNOSIS — Z992 Dependence on renal dialysis: Secondary | ICD-10-CM | POA: Diagnosis not present

## 2023-11-30 DIAGNOSIS — D509 Iron deficiency anemia, unspecified: Secondary | ICD-10-CM | POA: Diagnosis not present

## 2023-11-30 DIAGNOSIS — N2581 Secondary hyperparathyroidism of renal origin: Secondary | ICD-10-CM | POA: Diagnosis not present

## 2023-11-30 DIAGNOSIS — D631 Anemia in chronic kidney disease: Secondary | ICD-10-CM | POA: Diagnosis not present

## 2023-11-30 DIAGNOSIS — N186 End stage renal disease: Secondary | ICD-10-CM | POA: Diagnosis not present

## 2023-11-30 DIAGNOSIS — E1122 Type 2 diabetes mellitus with diabetic chronic kidney disease: Secondary | ICD-10-CM | POA: Diagnosis not present

## 2023-11-30 DIAGNOSIS — D689 Coagulation defect, unspecified: Secondary | ICD-10-CM | POA: Diagnosis not present

## 2023-12-02 DIAGNOSIS — N2581 Secondary hyperparathyroidism of renal origin: Secondary | ICD-10-CM | POA: Diagnosis not present

## 2023-12-02 DIAGNOSIS — D631 Anemia in chronic kidney disease: Secondary | ICD-10-CM | POA: Diagnosis not present

## 2023-12-02 DIAGNOSIS — N186 End stage renal disease: Secondary | ICD-10-CM | POA: Diagnosis not present

## 2023-12-02 DIAGNOSIS — E1122 Type 2 diabetes mellitus with diabetic chronic kidney disease: Secondary | ICD-10-CM | POA: Diagnosis not present

## 2023-12-02 DIAGNOSIS — D689 Coagulation defect, unspecified: Secondary | ICD-10-CM | POA: Diagnosis not present

## 2023-12-02 DIAGNOSIS — Z992 Dependence on renal dialysis: Secondary | ICD-10-CM | POA: Diagnosis not present

## 2023-12-02 DIAGNOSIS — D509 Iron deficiency anemia, unspecified: Secondary | ICD-10-CM | POA: Diagnosis not present

## 2023-12-04 DIAGNOSIS — Z992 Dependence on renal dialysis: Secondary | ICD-10-CM | POA: Diagnosis not present

## 2023-12-04 DIAGNOSIS — D631 Anemia in chronic kidney disease: Secondary | ICD-10-CM | POA: Diagnosis not present

## 2023-12-04 DIAGNOSIS — D509 Iron deficiency anemia, unspecified: Secondary | ICD-10-CM | POA: Diagnosis not present

## 2023-12-04 DIAGNOSIS — N2581 Secondary hyperparathyroidism of renal origin: Secondary | ICD-10-CM | POA: Diagnosis not present

## 2023-12-04 DIAGNOSIS — D689 Coagulation defect, unspecified: Secondary | ICD-10-CM | POA: Diagnosis not present

## 2023-12-04 DIAGNOSIS — E1122 Type 2 diabetes mellitus with diabetic chronic kidney disease: Secondary | ICD-10-CM | POA: Diagnosis not present

## 2023-12-04 DIAGNOSIS — N186 End stage renal disease: Secondary | ICD-10-CM | POA: Diagnosis not present

## 2023-12-07 DIAGNOSIS — D631 Anemia in chronic kidney disease: Secondary | ICD-10-CM | POA: Diagnosis not present

## 2023-12-07 DIAGNOSIS — N186 End stage renal disease: Secondary | ICD-10-CM | POA: Diagnosis not present

## 2023-12-07 DIAGNOSIS — D689 Coagulation defect, unspecified: Secondary | ICD-10-CM | POA: Diagnosis not present

## 2023-12-07 DIAGNOSIS — N2581 Secondary hyperparathyroidism of renal origin: Secondary | ICD-10-CM | POA: Diagnosis not present

## 2023-12-07 DIAGNOSIS — Z992 Dependence on renal dialysis: Secondary | ICD-10-CM | POA: Diagnosis not present

## 2023-12-07 DIAGNOSIS — D509 Iron deficiency anemia, unspecified: Secondary | ICD-10-CM | POA: Diagnosis not present

## 2023-12-07 DIAGNOSIS — E1122 Type 2 diabetes mellitus with diabetic chronic kidney disease: Secondary | ICD-10-CM | POA: Diagnosis not present

## 2023-12-09 DIAGNOSIS — N2581 Secondary hyperparathyroidism of renal origin: Secondary | ICD-10-CM | POA: Diagnosis not present

## 2023-12-09 DIAGNOSIS — D631 Anemia in chronic kidney disease: Secondary | ICD-10-CM | POA: Diagnosis not present

## 2023-12-09 DIAGNOSIS — D689 Coagulation defect, unspecified: Secondary | ICD-10-CM | POA: Diagnosis not present

## 2023-12-09 DIAGNOSIS — Z992 Dependence on renal dialysis: Secondary | ICD-10-CM | POA: Diagnosis not present

## 2023-12-09 DIAGNOSIS — E1122 Type 2 diabetes mellitus with diabetic chronic kidney disease: Secondary | ICD-10-CM | POA: Diagnosis not present

## 2023-12-09 DIAGNOSIS — D509 Iron deficiency anemia, unspecified: Secondary | ICD-10-CM | POA: Diagnosis not present

## 2023-12-09 DIAGNOSIS — N186 End stage renal disease: Secondary | ICD-10-CM | POA: Diagnosis not present

## 2023-12-11 DIAGNOSIS — D509 Iron deficiency anemia, unspecified: Secondary | ICD-10-CM | POA: Diagnosis not present

## 2023-12-11 DIAGNOSIS — E1122 Type 2 diabetes mellitus with diabetic chronic kidney disease: Secondary | ICD-10-CM | POA: Diagnosis not present

## 2023-12-11 DIAGNOSIS — D689 Coagulation defect, unspecified: Secondary | ICD-10-CM | POA: Diagnosis not present

## 2023-12-11 DIAGNOSIS — D631 Anemia in chronic kidney disease: Secondary | ICD-10-CM | POA: Diagnosis not present

## 2023-12-11 DIAGNOSIS — Z992 Dependence on renal dialysis: Secondary | ICD-10-CM | POA: Diagnosis not present

## 2023-12-11 DIAGNOSIS — N186 End stage renal disease: Secondary | ICD-10-CM | POA: Diagnosis not present

## 2023-12-11 DIAGNOSIS — N2581 Secondary hyperparathyroidism of renal origin: Secondary | ICD-10-CM | POA: Diagnosis not present

## 2023-12-14 DIAGNOSIS — D631 Anemia in chronic kidney disease: Secondary | ICD-10-CM | POA: Diagnosis not present

## 2023-12-14 DIAGNOSIS — D689 Coagulation defect, unspecified: Secondary | ICD-10-CM | POA: Diagnosis not present

## 2023-12-14 DIAGNOSIS — N186 End stage renal disease: Secondary | ICD-10-CM | POA: Diagnosis not present

## 2023-12-14 DIAGNOSIS — Z992 Dependence on renal dialysis: Secondary | ICD-10-CM | POA: Diagnosis not present

## 2023-12-14 DIAGNOSIS — D509 Iron deficiency anemia, unspecified: Secondary | ICD-10-CM | POA: Diagnosis not present

## 2023-12-14 DIAGNOSIS — E1122 Type 2 diabetes mellitus with diabetic chronic kidney disease: Secondary | ICD-10-CM | POA: Diagnosis not present

## 2023-12-14 DIAGNOSIS — N2581 Secondary hyperparathyroidism of renal origin: Secondary | ICD-10-CM | POA: Diagnosis not present

## 2023-12-16 DIAGNOSIS — N186 End stage renal disease: Secondary | ICD-10-CM | POA: Diagnosis not present

## 2023-12-16 DIAGNOSIS — D509 Iron deficiency anemia, unspecified: Secondary | ICD-10-CM | POA: Diagnosis not present

## 2023-12-16 DIAGNOSIS — Z992 Dependence on renal dialysis: Secondary | ICD-10-CM | POA: Diagnosis not present

## 2023-12-16 DIAGNOSIS — D689 Coagulation defect, unspecified: Secondary | ICD-10-CM | POA: Diagnosis not present

## 2023-12-16 DIAGNOSIS — D631 Anemia in chronic kidney disease: Secondary | ICD-10-CM | POA: Diagnosis not present

## 2023-12-16 DIAGNOSIS — E1122 Type 2 diabetes mellitus with diabetic chronic kidney disease: Secondary | ICD-10-CM | POA: Diagnosis not present

## 2023-12-16 DIAGNOSIS — N2581 Secondary hyperparathyroidism of renal origin: Secondary | ICD-10-CM | POA: Diagnosis not present

## 2023-12-18 DIAGNOSIS — D631 Anemia in chronic kidney disease: Secondary | ICD-10-CM | POA: Diagnosis not present

## 2023-12-18 DIAGNOSIS — Z992 Dependence on renal dialysis: Secondary | ICD-10-CM | POA: Diagnosis not present

## 2023-12-18 DIAGNOSIS — N186 End stage renal disease: Secondary | ICD-10-CM | POA: Diagnosis not present

## 2023-12-18 DIAGNOSIS — E1122 Type 2 diabetes mellitus with diabetic chronic kidney disease: Secondary | ICD-10-CM | POA: Diagnosis not present

## 2023-12-18 DIAGNOSIS — D689 Coagulation defect, unspecified: Secondary | ICD-10-CM | POA: Diagnosis not present

## 2023-12-18 DIAGNOSIS — D509 Iron deficiency anemia, unspecified: Secondary | ICD-10-CM | POA: Diagnosis not present

## 2023-12-18 DIAGNOSIS — N2581 Secondary hyperparathyroidism of renal origin: Secondary | ICD-10-CM | POA: Diagnosis not present

## 2023-12-21 DIAGNOSIS — E1122 Type 2 diabetes mellitus with diabetic chronic kidney disease: Secondary | ICD-10-CM | POA: Diagnosis not present

## 2023-12-21 DIAGNOSIS — N2581 Secondary hyperparathyroidism of renal origin: Secondary | ICD-10-CM | POA: Diagnosis not present

## 2023-12-21 DIAGNOSIS — D689 Coagulation defect, unspecified: Secondary | ICD-10-CM | POA: Diagnosis not present

## 2023-12-21 DIAGNOSIS — D509 Iron deficiency anemia, unspecified: Secondary | ICD-10-CM | POA: Diagnosis not present

## 2023-12-21 DIAGNOSIS — D631 Anemia in chronic kidney disease: Secondary | ICD-10-CM | POA: Diagnosis not present

## 2023-12-21 DIAGNOSIS — N186 End stage renal disease: Secondary | ICD-10-CM | POA: Diagnosis not present

## 2023-12-21 DIAGNOSIS — Z992 Dependence on renal dialysis: Secondary | ICD-10-CM | POA: Diagnosis not present

## 2023-12-25 DIAGNOSIS — D631 Anemia in chronic kidney disease: Secondary | ICD-10-CM | POA: Diagnosis not present

## 2023-12-25 DIAGNOSIS — N186 End stage renal disease: Secondary | ICD-10-CM | POA: Diagnosis not present

## 2023-12-25 DIAGNOSIS — D509 Iron deficiency anemia, unspecified: Secondary | ICD-10-CM | POA: Diagnosis not present

## 2023-12-25 DIAGNOSIS — D689 Coagulation defect, unspecified: Secondary | ICD-10-CM | POA: Diagnosis not present

## 2023-12-25 DIAGNOSIS — Z992 Dependence on renal dialysis: Secondary | ICD-10-CM | POA: Diagnosis not present

## 2023-12-25 DIAGNOSIS — N2581 Secondary hyperparathyroidism of renal origin: Secondary | ICD-10-CM | POA: Diagnosis not present

## 2023-12-25 DIAGNOSIS — E1122 Type 2 diabetes mellitus with diabetic chronic kidney disease: Secondary | ICD-10-CM | POA: Diagnosis not present

## 2023-12-28 DIAGNOSIS — D689 Coagulation defect, unspecified: Secondary | ICD-10-CM | POA: Diagnosis not present

## 2023-12-28 DIAGNOSIS — D631 Anemia in chronic kidney disease: Secondary | ICD-10-CM | POA: Diagnosis not present

## 2023-12-28 DIAGNOSIS — D509 Iron deficiency anemia, unspecified: Secondary | ICD-10-CM | POA: Diagnosis not present

## 2023-12-28 DIAGNOSIS — N2581 Secondary hyperparathyroidism of renal origin: Secondary | ICD-10-CM | POA: Diagnosis not present

## 2023-12-28 DIAGNOSIS — N186 End stage renal disease: Secondary | ICD-10-CM | POA: Diagnosis not present

## 2023-12-28 DIAGNOSIS — E1122 Type 2 diabetes mellitus with diabetic chronic kidney disease: Secondary | ICD-10-CM | POA: Diagnosis not present

## 2023-12-28 DIAGNOSIS — Z992 Dependence on renal dialysis: Secondary | ICD-10-CM | POA: Diagnosis not present

## 2023-12-29 ENCOUNTER — Other Ambulatory Visit: Payer: Self-pay

## 2023-12-29 ENCOUNTER — Encounter (HOSPITAL_COMMUNITY)
Admission: RE | Disposition: A | Discharge status: Home / Self Care | Payer: Self-pay | Source: Home / Self Care | Attending: Nephrology

## 2023-12-29 ENCOUNTER — Encounter (HOSPITAL_COMMUNITY): Payer: Self-pay | Admitting: Nephrology

## 2023-12-29 ENCOUNTER — Ambulatory Visit (HOSPITAL_COMMUNITY)
Admission: RE | Admit: 2023-12-29 | Discharge: 2023-12-29 | Disposition: A | Discharge status: Home / Self Care | Payer: 59 | Attending: Nephrology | Admitting: Nephrology

## 2023-12-29 DIAGNOSIS — Z794 Long term (current) use of insulin: Secondary | ICD-10-CM | POA: Diagnosis not present

## 2023-12-29 DIAGNOSIS — F17210 Nicotine dependence, cigarettes, uncomplicated: Secondary | ICD-10-CM | POA: Insufficient documentation

## 2023-12-29 DIAGNOSIS — Z833 Family history of diabetes mellitus: Secondary | ICD-10-CM | POA: Diagnosis not present

## 2023-12-29 DIAGNOSIS — I120 Hypertensive chronic kidney disease with stage 5 chronic kidney disease or end stage renal disease: Secondary | ICD-10-CM | POA: Diagnosis not present

## 2023-12-29 DIAGNOSIS — T82858A Stenosis of vascular prosthetic devices, implants and grafts, initial encounter: Secondary | ICD-10-CM | POA: Insufficient documentation

## 2023-12-29 DIAGNOSIS — D631 Anemia in chronic kidney disease: Secondary | ICD-10-CM | POA: Diagnosis not present

## 2023-12-29 DIAGNOSIS — N186 End stage renal disease: Secondary | ICD-10-CM | POA: Diagnosis not present

## 2023-12-29 DIAGNOSIS — N250 Renal osteodystrophy: Secondary | ICD-10-CM | POA: Diagnosis not present

## 2023-12-29 DIAGNOSIS — Z8249 Family history of ischemic heart disease and other diseases of the circulatory system: Secondary | ICD-10-CM | POA: Diagnosis not present

## 2023-12-29 DIAGNOSIS — E1122 Type 2 diabetes mellitus with diabetic chronic kidney disease: Secondary | ICD-10-CM | POA: Insufficient documentation

## 2023-12-29 DIAGNOSIS — Z992 Dependence on renal dialysis: Secondary | ICD-10-CM | POA: Insufficient documentation

## 2023-12-29 DIAGNOSIS — I871 Compression of vein: Secondary | ICD-10-CM | POA: Diagnosis not present

## 2023-12-29 DIAGNOSIS — Y832 Surgical operation with anastomosis, bypass or graft as the cause of abnormal reaction of the patient, or of later complication, without mention of misadventure at the time of the procedure: Secondary | ICD-10-CM | POA: Diagnosis not present

## 2023-12-29 HISTORY — PX: PERIPHERAL VASCULAR BALLOON ANGIOPLASTY: CATH118281

## 2023-12-29 HISTORY — PX: A/V FISTULAGRAM: CATH118298

## 2023-12-29 SURGERY — A/V FISTULAGRAM
Anesthesia: LOCAL

## 2023-12-29 MED ORDER — LIDOCAINE HCL (PF) 1 % IJ SOLN
INTRAMUSCULAR | Status: AC
  Filled 2023-12-29: qty 30

## 2023-12-29 MED ORDER — LIDOCAINE HCL (PF) 1 % IJ SOLN
INTRAMUSCULAR | Status: DC | PRN
  Administered 2023-12-29: 2 mL via SUBCUTANEOUS

## 2023-12-29 MED ORDER — MIDAZOLAM HCL 2 MG/2ML IJ SOLN
INTRAMUSCULAR | Status: DC | PRN
  Administered 2023-12-29: 2 mg via INTRAVENOUS

## 2023-12-29 MED ORDER — FENTANYL CITRATE (PF) 100 MCG/2ML IJ SOLN
INTRAMUSCULAR | Status: DC | PRN
  Administered 2023-12-29: 50 ug via INTRAVENOUS

## 2023-12-29 MED ORDER — HEPARIN (PORCINE) IN NACL 1000-0.9 UT/500ML-% IV SOLN
INTRAVENOUS | Status: DC | PRN
  Administered 2023-12-29: 500 mL

## 2023-12-29 MED ORDER — FENTANYL CITRATE (PF) 100 MCG/2ML IJ SOLN
INTRAMUSCULAR | Status: AC
  Filled 2023-12-29: qty 2

## 2023-12-29 MED ORDER — IODIXANOL 320 MG/ML IV SOLN
INTRAVENOUS | Status: DC | PRN
  Administered 2023-12-29: 12 mL via INTRAVENOUS

## 2023-12-29 MED ORDER — MIDAZOLAM HCL 2 MG/2ML IJ SOLN
INTRAMUSCULAR | Status: AC
  Filled 2023-12-29: qty 2

## 2023-12-29 SURGICAL SUPPLY — 11 items
BAG SNAP BAND KOVER 36X36 (MISCELLANEOUS) ×2 IMPLANT
BALLN ATHLETIS 9X40X75 (BALLOONS) ×2 IMPLANT
BALLOON ATHLETIS 9X40X75 (BALLOONS) IMPLANT
COVER DOME SNAP 22 D (MISCELLANEOUS) ×2 IMPLANT
SHEATH PINNACLE R/O II 6F 4CM (SHEATH) IMPLANT
SHEATH PINNACLE R/O II 7F 4CM (SHEATH) IMPLANT
SYR MEDALLION 10ML (SYRINGE) IMPLANT
SYR MEDALLION 1ML (SYRINGE) IMPLANT
SYR MEDALLION 3ML (SYRINGE) IMPLANT
TRAY PV CATH (CUSTOM PROCEDURE TRAY) ×2 IMPLANT
WIRE BENTSON .035X145CM (WIRE) IMPLANT

## 2023-12-29 NOTE — Discharge Instructions (Signed)

## 2023-12-29 NOTE — Op Note (Signed)
 Prolonged bleeding in a left brachiobasilic fistula which was transposed on August 28, 2022; patient has never had percutaneous angioplasty in this current access.  On exam the access is of good caliber but very pulsatile towards the outflow.    Summary:  1)      The patient had successful angioplasty (9x4 mm Athletis  FE ~34 atm) of significant 70% stenosis in the outflow basilic vein swing site.  Axillary vein, centrals and inflow anastomosis were all widely patent. 2)      Flows improved after outflow angioplasty. 3)      This left BBT remains amenable to future percutaneous intervention as long as it remains patent at least 3 months.   Description of procedure: The arm was prepped and draped in the usual sterile fashion. The left upper arm brachial basilic fistula was cannulated (98119) with an 18G Angiocath needle directed in an antegrade direction in arterial limb of the fistula. A guidewire was inserted and exchanged for a 7 Fr sheath. Contrast [phone]) injection via the side port of the sheath was performed. The angiogram of the fistula (95621) showed a focal 70% stenosis in the outflow basilic vein; the axillary vein, centrals, cannulation zone and inflow anastomosis were patent.    A 0.035 wire was then inserted through the sheath and parked in the central veins. A 10x4  Athletis angioplasty balloon was then inserted over the guidewire and positioned at the basilic vein outflow swing site stenosis.   Venous angioplasty (30865) was carried out to 34 ATM with FULL effacement of the waist on the balloon at all basilic outflow vein lesion sites. The repeat angiogram showed 10% residual stenosis at the sites and the outflow basilic with no evidence of extravasation or dissection.   Hemostasis: A 3-0 ethilon purse string suture was placed at the cannulation site on removal of the sheath.   Sedation: 2 mg,  50 mcg. Sedation time: 15 minutes   Contrast. 12 mL   Monitoring: Because of the  patient's comorbid conditions and sedation during the procedure, continuous EKG monitoring and O2 saturation monitoring was performed throughout the procedure by the RN. There were no abnormal arrhythmias encountered.   Complications: None   Diagnoses: I87.1 Stricture of vein  N18.6 ESRD T82.858A Stricture of access   Procedure Coding:  [phone] Cannulation and angiogram of fistula, venous angioplasty (basilic vein outflow swing site)  [id number] Contrast   Recommendations:  1. Continue to cannulate the fistula with 15G needles.  2. Refer for problems with flows/swelling. 3. Remove the suture next treatment.    Discharge: The patient was discharged home in stable condition. The patient was given education regarding the care of the dialysis access AVF and specific instructions in case of any problems.

## 2023-12-29 NOTE — H&P (Addendum)
 Chief Complaint: Decreased flows  Interval H&P  The patient has presented today for an angiogram/ angioplasty.  Various methods of treatment have been discussed with the patient.  After consideration of risk, benefits and other options for treatment, the patient has consented to a angiogram/ angioplasty with  possible stent placement.   Risks of angiogram with potential angioplasty and stenting if needed.contrast reaction, extravasation/ bleeding, dissection, hypotension and death were explained to the patient.  The patient's history has been reviewed and the patient has been examined, no changes in status.  Stable for angiogram/angioplasty  I have reviewed the patient's chart and labs.  Questions were answered to the patient's satisfaction.  Assessment/Plan: ESRD dialyzing MWF. Decreased access flows left BBT placed August 28, 2022 by Dr. Lenell Antu - planning on angiogram with possibly angioplasty. Renal osteodystrophy - continue binders per home regimen. Anemia - managed with ESA's and IV iron at dialysis center. HTN - resume home regimen.   HPI: Levi Hodges is an 48 y.o. male with a history of depression, type 2 diabetes, hypertension, end-stage renal disease being referred for decreasing access flows in the left upper arm brachiobasilic transposed fistula placed by Dr. Lenell Antu.   ROS Per HPI.  Chemistry and CBC: Creatinine  Date/Time Value Ref Range Status  10/30/2022 10:45 AM 8.92 (HH) 0.61 - 1.24 mg/dL Final    Comment:    REPEATED TO VERIFY CRITICAL RESULT CALLED TO, READ BACK BY AND VERIFIED WITH: Melodie Bouillon at 1129 on 28Dec23 by [facility]     Creatinine, Ser  Date/Time Value Ref Range Status  08/27/2023 08:48 AM 10.36 (H) 0.61 - 1.24 mg/dL Final  82/95/6213 08:65 PM 7.60 (H) 0.61 - 1.24 mg/dL Final  78/46/9629 52:84 AM 12.76 (H) 0.61 - 1.24 mg/dL Final  13/24/4010 27:25 PM 9.36 (H) 0.61 - 1.24 mg/dL Final  36/64/4034 74:25 PM 7.76 (H) 0.61 - 1.24 mg/dL Final   95/63/8756 43:32 AM 6.50 (H) 0.61 - 1.24 mg/dL Final  95/18/8416 60:63 PM 9.27 (H) 0.61 - 1.24 mg/dL Final  01/60/1093 23:55 AM 7.00 (H) 0.61 - 1.24 mg/dL Final  73/22/0254 27:06 AM 7.40 (H) 0.61 - 1.24 mg/dL Final  23/76/2831 51:76 AM 6.73 (H) 0.61 - 1.24 mg/dL Final  16/05/3709 62:69 AM 8.64 (H) 0.61 - 1.24 mg/dL Final  48/54/6270 35:00 AM 8.67 (H) 0.61 - 1.24 mg/dL Final  93/81/8299 37:16 AM 8.77 (H) 0.61 - 1.24 mg/dL Final  96/78/9381 01:75 AM 3.12 (H) 0.61 - 1.24 mg/dL Final  08/27/8526 78:24 AM 3.31 (H) 0.61 - 1.24 mg/dL Final  23/53/6144 31:54 AM 3.64 (H) 0.61 - 1.24 mg/dL Final  00/86/7619 50:93 PM 3.56 (H) 0.40 - 1.50 mg/dL Final  26/71/2458 09:98 AM 1.82 (H) 0.40 - 1.50 mg/dL Final  33/82/5053 97:67 AM 1.87 (H) 0.40 - 1.50 mg/dL Final  34/19/3790 24:09 AM 1.52 (H) 0.40 - 1.50 mg/dL Final  73/53/2992 42:68 PM 1.38 0.40 - 1.50 mg/dL Final  34/19/6222 97:98 AM 1.40 0.40 - 1.50 mg/dL Final  92/09/9416 40:81 PM 1.33 0.40 - 1.50 mg/dL Final   No results for input(s): "NA", "K", "CL", "CO2", "GLUCOSE", "BUN", "CREATININE", "CALCIUM", "PHOS" in the last 168 hours.  Invalid input(s): "ALB" No results for input(s): "WBC", "NEUTROABS", "HGB", "HCT", "MCV", "PLT" in the last 168 hours. Liver Function Tests: No results for input(s): "AST", "ALT", "ALKPHOS", "BILITOT", "PROT", "ALBUMIN" in the last 168 hours. No results for input(s): "LIPASE", "AMYLASE" in the last 168 hours. No results for input(s): "AMMONIA" in the last 168 hours. Cardiac Enzymes:  No results for input(s): "CKTOTAL", "CKMB", "CKMBINDEX", "TROPONINI" in the last 168 hours. Iron Studies: No results for input(s): "IRON", "TIBC", "TRANSFERRIN", "FERRITIN" in the last 72 hours. PT/INR: @LABRCNTIP (inr:5)  Xrays/Other Studies: )No results found for this or any previous visit (from the past 48 hours). No results found.  PMH:   Past Medical History:  Diagnosis Date   Anemia    Depression    Diabetes mellitus without  complication (HCC)    Type II- no meds   Hypertension    Renal disorder     PSH:   Past Surgical History:  Procedure Laterality Date   AV FISTULA PLACEMENT Left 03/26/2022   Procedure: ARTERIOVENOUS (AV) FISTULA CREATION;  Surgeon: [name], MD;  Location: [facility] OR;  Service: Vascular;  Laterality: Left;   AV FISTULA PLACEMENT Left 05/22/2022   Procedure: FIRSTSTAGE BASILIC ARTERIOVENOUS  FISTULA CREATION;  Surgeon: [name], MD;  Location: [facility] OR;  Service: Vascular;  Laterality: Left;   BASCILIC VEIN TRANSPOSITION Left 08/28/2022   Procedure: LEFT SECOND STAGE BASILIC VEIN TRANSPOSITION;  Surgeon: [name], MD;  Location: [facility] OR;  Service: Vascular;  Laterality: Left;  PERIPHERAL NERVE BLOCK   CHOLECYSTECTOMY N/A 08/26/2023   Procedure: LAPAROSCOPIC CHOLECYSTECTOMY;  Surgeon: Emelia Loron, MD;  Location: [facility] OR;  Service: General;  Laterality: N/A;   INSERTION OF DIALYSIS CATHETER Right 03/26/2022   Procedure: INSERTION OF DIALYSIS CATHETER USING PALINDROME PRECISOIN CHRONIC CATHETER KIT (19cm);  Surgeon: [name], MD;  Location: [facility] OR;  Service: Vascular;  Laterality: Right;   TOOTH EXTRACTION N/A 07/23/2022   Procedure: DENTAL RESTORATION/EXTRACTIONS;  Surgeon: [name], DMD;  Location: [facility] OR;  Service: Oral Surgery;  Laterality: N/A;    Allergies: No Known Allergies  Medications:   Prior to Admission medications   Medication Sig Start Date End Date Taking? Authorizing Provider  acetaminophen (TYLENOL) 500 MG tablet Take 2 tablets (1,000 mg total) by mouth every 6 (six) hours as needed for mild pain (pain score 1-3). 08/27/23   Meuth, Brooke A, PA-C  amLODipine (NORVASC) 10 MG tablet TAKE 1 TABLET(10 MG) BY MOUTH DAILY Patient taking differently: Take 10 mg by mouth in the morning and at bedtime. 01/29/23   [name], MD  AURYXIA 1 GM 210 MG(Fe) tablet Take 420 mg by mouth 3 (three) times daily. 07/21/23   [provider]   carvedilol (COREG) 12.5 MG tablet Take 1 tablet (12.5 mg total) by mouth 2 (two) times daily. 08/27/23   Rhetta Mura, MD  docusate sodium (COLACE) 100 MG capsule Take 1 capsule (100 mg total) by mouth 2 (two) times daily. 08/27/23   Meuth, Brooke A, PA-C  gabapentin (NEURONTIN) 300 MG capsule Take 1 capsule (300 mg total) by mouth at bedtime. 08/27/23   Rhetta Mura, MD  glucose blood test strip Use as instructed 10/07/23   [name], MD  Insulin Pen Needle (PEN NEEDLES) 31G X 5 MM MISC 1 Package by Does not apply route daily. 04/15/19   [name], [name], MD  Lancets (ACCU-CHEK MULTICLIX) lancets Use as instructed up to 4 times a day 07/28/22   [name], MD  nicotine (NICODERM CQ - DOSED IN MG/24 HOURS) 21 mg/24hr patch Place 1 patch (21 mg total) onto the skin daily. 08/28/23   Rhetta Mura, MD  ondansetron (ZOFRAN) 4 MG tablet Take 1 tablet (4 mg total) by mouth every 8 (eight) hours as needed for nausea or vomiting. Patient taking differently: Take 4  mg by mouth as needed for nausea or vomiting. 08/21/23   [name], MD  oxyCODONE 10 MG TABS Take 0.5-1 tablets (5-10 mg total) by mouth every 6 (six) hours as needed for severe pain (pain score 7-10). 08/27/23   Meuth, Brooke A, PA-C  pantoprazole (PROTONIX) 40 MG tablet Take 1 tablet (40 mg total) by mouth daily. 08/21/23   [name], MD  polyethylene glycol (MIRALAX) 17 g packet Take 17 g by mouth daily as needed for mild constipation. 08/27/23   Meuth, Brooke A, PA-C  sevelamer carbonate (RENVELA) 800 MG tablet Take 1 tablet (800 mg total) by mouth 3 (three) times daily with meals. Patient not taking: Reported on 08/25/2023 05/21/22   [name], MD  simvastatin (ZOCOR) 20 MG tablet TAKE 1 TABLET(20 MG) BY MOUTH AT BEDTIME 04/25/22   [name], MD    Discontinued Meds:  There are no discontinued medications.  Social History:  reports that he  has been smoking cigarettes. He has a 60 pack-year smoking history. He has been exposed to tobacco smoke. He has never used smokeless tobacco. He reports that he does not drink alcohol and does not use drugs.  Family History:   Family History  Problem Relation Age of Onset   High blood pressure Mother    Diabetes Maternal Grandfather     There were no vitals taken for this visit. GEN: NAD, A&Ox3, NCAT HEENT: No conjunctival pallor, EOMI NECK: Supple, no thyromegaly LUNGS: CTA B/L no rales, rhonchi or wheezing CV: RRR, No M/R/G ABD: SNDNT +BS  EXT: No lower extremity edema ACCESS: lt BBT hyperpulsatile towards the outflow       Ethelene Hal, MD 12/29/2023, 9:49 AM

## 2023-12-30 DIAGNOSIS — E1122 Type 2 diabetes mellitus with diabetic chronic kidney disease: Secondary | ICD-10-CM | POA: Diagnosis not present

## 2023-12-30 DIAGNOSIS — D509 Iron deficiency anemia, unspecified: Secondary | ICD-10-CM | POA: Diagnosis not present

## 2023-12-30 DIAGNOSIS — Z992 Dependence on renal dialysis: Secondary | ICD-10-CM | POA: Diagnosis not present

## 2023-12-30 DIAGNOSIS — D689 Coagulation defect, unspecified: Secondary | ICD-10-CM | POA: Diagnosis not present

## 2023-12-30 DIAGNOSIS — N2581 Secondary hyperparathyroidism of renal origin: Secondary | ICD-10-CM | POA: Diagnosis not present

## 2023-12-30 DIAGNOSIS — D631 Anemia in chronic kidney disease: Secondary | ICD-10-CM | POA: Diagnosis not present

## 2023-12-30 DIAGNOSIS — N186 End stage renal disease: Secondary | ICD-10-CM | POA: Diagnosis not present

## 2023-12-31 ENCOUNTER — Ambulatory Visit: Payer: 59 | Admitting: Internal Medicine

## 2024-01-01 DIAGNOSIS — D689 Coagulation defect, unspecified: Secondary | ICD-10-CM | POA: Diagnosis not present

## 2024-01-01 DIAGNOSIS — N2581 Secondary hyperparathyroidism of renal origin: Secondary | ICD-10-CM | POA: Diagnosis not present

## 2024-01-01 DIAGNOSIS — D631 Anemia in chronic kidney disease: Secondary | ICD-10-CM | POA: Diagnosis not present

## 2024-01-01 DIAGNOSIS — N186 End stage renal disease: Secondary | ICD-10-CM | POA: Diagnosis not present

## 2024-01-01 DIAGNOSIS — D509 Iron deficiency anemia, unspecified: Secondary | ICD-10-CM | POA: Diagnosis not present

## 2024-01-01 DIAGNOSIS — E1122 Type 2 diabetes mellitus with diabetic chronic kidney disease: Secondary | ICD-10-CM | POA: Diagnosis not present

## 2024-01-01 DIAGNOSIS — Z992 Dependence on renal dialysis: Secondary | ICD-10-CM | POA: Diagnosis not present

## 2024-01-04 DIAGNOSIS — Z992 Dependence on renal dialysis: Secondary | ICD-10-CM | POA: Diagnosis not present

## 2024-01-04 DIAGNOSIS — D689 Coagulation defect, unspecified: Secondary | ICD-10-CM | POA: Diagnosis not present

## 2024-01-04 DIAGNOSIS — D631 Anemia in chronic kidney disease: Secondary | ICD-10-CM | POA: Diagnosis not present

## 2024-01-04 DIAGNOSIS — N2581 Secondary hyperparathyroidism of renal origin: Secondary | ICD-10-CM | POA: Diagnosis not present

## 2024-01-04 DIAGNOSIS — N186 End stage renal disease: Secondary | ICD-10-CM | POA: Diagnosis not present

## 2024-01-05 DIAGNOSIS — Z992 Dependence on renal dialysis: Secondary | ICD-10-CM | POA: Diagnosis not present

## 2024-01-05 DIAGNOSIS — N186 End stage renal disease: Secondary | ICD-10-CM | POA: Diagnosis not present

## 2024-01-05 DIAGNOSIS — N2581 Secondary hyperparathyroidism of renal origin: Secondary | ICD-10-CM | POA: Diagnosis not present

## 2024-01-05 DIAGNOSIS — D689 Coagulation defect, unspecified: Secondary | ICD-10-CM | POA: Diagnosis not present

## 2024-01-05 DIAGNOSIS — D631 Anemia in chronic kidney disease: Secondary | ICD-10-CM | POA: Diagnosis not present

## 2024-01-08 DIAGNOSIS — D689 Coagulation defect, unspecified: Secondary | ICD-10-CM | POA: Diagnosis not present

## 2024-01-08 DIAGNOSIS — N186 End stage renal disease: Secondary | ICD-10-CM | POA: Diagnosis not present

## 2024-01-08 DIAGNOSIS — Z992 Dependence on renal dialysis: Secondary | ICD-10-CM | POA: Diagnosis not present

## 2024-01-08 DIAGNOSIS — D631 Anemia in chronic kidney disease: Secondary | ICD-10-CM | POA: Diagnosis not present

## 2024-01-08 DIAGNOSIS — N2581 Secondary hyperparathyroidism of renal origin: Secondary | ICD-10-CM | POA: Diagnosis not present

## 2024-01-11 DIAGNOSIS — N2581 Secondary hyperparathyroidism of renal origin: Secondary | ICD-10-CM | POA: Diagnosis not present

## 2024-01-11 DIAGNOSIS — D631 Anemia in chronic kidney disease: Secondary | ICD-10-CM | POA: Diagnosis not present

## 2024-01-11 DIAGNOSIS — N186 End stage renal disease: Secondary | ICD-10-CM | POA: Diagnosis not present

## 2024-01-11 DIAGNOSIS — Z992 Dependence on renal dialysis: Secondary | ICD-10-CM | POA: Diagnosis not present

## 2024-01-11 DIAGNOSIS — D689 Coagulation defect, unspecified: Secondary | ICD-10-CM | POA: Diagnosis not present

## 2024-01-13 DIAGNOSIS — Z992 Dependence on renal dialysis: Secondary | ICD-10-CM | POA: Diagnosis not present

## 2024-01-13 DIAGNOSIS — D689 Coagulation defect, unspecified: Secondary | ICD-10-CM | POA: Diagnosis not present

## 2024-01-13 DIAGNOSIS — D631 Anemia in chronic kidney disease: Secondary | ICD-10-CM | POA: Diagnosis not present

## 2024-01-13 DIAGNOSIS — N186 End stage renal disease: Secondary | ICD-10-CM | POA: Diagnosis not present

## 2024-01-13 DIAGNOSIS — N2581 Secondary hyperparathyroidism of renal origin: Secondary | ICD-10-CM | POA: Diagnosis not present

## 2024-01-15 DIAGNOSIS — D689 Coagulation defect, unspecified: Secondary | ICD-10-CM | POA: Diagnosis not present

## 2024-01-15 DIAGNOSIS — Z992 Dependence on renal dialysis: Secondary | ICD-10-CM | POA: Diagnosis not present

## 2024-01-15 DIAGNOSIS — D631 Anemia in chronic kidney disease: Secondary | ICD-10-CM | POA: Diagnosis not present

## 2024-01-15 DIAGNOSIS — N2581 Secondary hyperparathyroidism of renal origin: Secondary | ICD-10-CM | POA: Diagnosis not present

## 2024-01-15 DIAGNOSIS — N186 End stage renal disease: Secondary | ICD-10-CM | POA: Diagnosis not present

## 2024-01-18 DIAGNOSIS — Z992 Dependence on renal dialysis: Secondary | ICD-10-CM | POA: Diagnosis not present

## 2024-01-18 DIAGNOSIS — N2581 Secondary hyperparathyroidism of renal origin: Secondary | ICD-10-CM | POA: Diagnosis not present

## 2024-01-18 DIAGNOSIS — D631 Anemia in chronic kidney disease: Secondary | ICD-10-CM | POA: Diagnosis not present

## 2024-01-18 DIAGNOSIS — D689 Coagulation defect, unspecified: Secondary | ICD-10-CM | POA: Diagnosis not present

## 2024-01-18 DIAGNOSIS — N186 End stage renal disease: Secondary | ICD-10-CM | POA: Diagnosis not present

## 2024-01-20 DIAGNOSIS — N186 End stage renal disease: Secondary | ICD-10-CM | POA: Diagnosis not present

## 2024-01-20 DIAGNOSIS — D689 Coagulation defect, unspecified: Secondary | ICD-10-CM | POA: Diagnosis not present

## 2024-01-20 DIAGNOSIS — D631 Anemia in chronic kidney disease: Secondary | ICD-10-CM | POA: Diagnosis not present

## 2024-01-20 DIAGNOSIS — Z992 Dependence on renal dialysis: Secondary | ICD-10-CM | POA: Diagnosis not present

## 2024-01-20 DIAGNOSIS — N2581 Secondary hyperparathyroidism of renal origin: Secondary | ICD-10-CM | POA: Diagnosis not present

## 2024-01-22 DIAGNOSIS — D689 Coagulation defect, unspecified: Secondary | ICD-10-CM | POA: Diagnosis not present

## 2024-01-22 DIAGNOSIS — N186 End stage renal disease: Secondary | ICD-10-CM | POA: Diagnosis not present

## 2024-01-22 DIAGNOSIS — Z992 Dependence on renal dialysis: Secondary | ICD-10-CM | POA: Diagnosis not present

## 2024-01-22 DIAGNOSIS — D631 Anemia in chronic kidney disease: Secondary | ICD-10-CM | POA: Diagnosis not present

## 2024-01-22 DIAGNOSIS — N2581 Secondary hyperparathyroidism of renal origin: Secondary | ICD-10-CM | POA: Diagnosis not present

## 2024-01-25 DIAGNOSIS — N2581 Secondary hyperparathyroidism of renal origin: Secondary | ICD-10-CM | POA: Diagnosis not present

## 2024-01-25 DIAGNOSIS — D631 Anemia in chronic kidney disease: Secondary | ICD-10-CM | POA: Diagnosis not present

## 2024-01-25 DIAGNOSIS — N186 End stage renal disease: Secondary | ICD-10-CM | POA: Diagnosis not present

## 2024-01-25 DIAGNOSIS — D689 Coagulation defect, unspecified: Secondary | ICD-10-CM | POA: Diagnosis not present

## 2024-01-25 DIAGNOSIS — Z992 Dependence on renal dialysis: Secondary | ICD-10-CM | POA: Diagnosis not present

## 2024-01-27 DIAGNOSIS — Z992 Dependence on renal dialysis: Secondary | ICD-10-CM | POA: Diagnosis not present

## 2024-01-27 DIAGNOSIS — N2581 Secondary hyperparathyroidism of renal origin: Secondary | ICD-10-CM | POA: Diagnosis not present

## 2024-01-27 DIAGNOSIS — D689 Coagulation defect, unspecified: Secondary | ICD-10-CM | POA: Diagnosis not present

## 2024-01-27 DIAGNOSIS — D631 Anemia in chronic kidney disease: Secondary | ICD-10-CM | POA: Diagnosis not present

## 2024-01-27 DIAGNOSIS — N186 End stage renal disease: Secondary | ICD-10-CM | POA: Diagnosis not present

## 2024-01-29 DIAGNOSIS — Z992 Dependence on renal dialysis: Secondary | ICD-10-CM | POA: Diagnosis not present

## 2024-01-29 DIAGNOSIS — N2581 Secondary hyperparathyroidism of renal origin: Secondary | ICD-10-CM | POA: Diagnosis not present

## 2024-01-29 DIAGNOSIS — N186 End stage renal disease: Secondary | ICD-10-CM | POA: Diagnosis not present

## 2024-01-29 DIAGNOSIS — D689 Coagulation defect, unspecified: Secondary | ICD-10-CM | POA: Diagnosis not present

## 2024-01-29 DIAGNOSIS — D631 Anemia in chronic kidney disease: Secondary | ICD-10-CM | POA: Diagnosis not present

## 2024-02-01 DIAGNOSIS — D689 Coagulation defect, unspecified: Secondary | ICD-10-CM | POA: Diagnosis not present

## 2024-02-01 DIAGNOSIS — N2581 Secondary hyperparathyroidism of renal origin: Secondary | ICD-10-CM | POA: Diagnosis not present

## 2024-02-01 DIAGNOSIS — D631 Anemia in chronic kidney disease: Secondary | ICD-10-CM | POA: Diagnosis not present

## 2024-02-01 DIAGNOSIS — Z992 Dependence on renal dialysis: Secondary | ICD-10-CM | POA: Diagnosis not present

## 2024-02-01 DIAGNOSIS — E1122 Type 2 diabetes mellitus with diabetic chronic kidney disease: Secondary | ICD-10-CM | POA: Diagnosis not present

## 2024-02-01 DIAGNOSIS — N186 End stage renal disease: Secondary | ICD-10-CM | POA: Diagnosis not present

## 2024-02-03 DIAGNOSIS — N2581 Secondary hyperparathyroidism of renal origin: Secondary | ICD-10-CM | POA: Diagnosis not present

## 2024-02-03 DIAGNOSIS — D689 Coagulation defect, unspecified: Secondary | ICD-10-CM | POA: Diagnosis not present

## 2024-02-03 DIAGNOSIS — D509 Iron deficiency anemia, unspecified: Secondary | ICD-10-CM | POA: Diagnosis not present

## 2024-02-03 DIAGNOSIS — E1122 Type 2 diabetes mellitus with diabetic chronic kidney disease: Secondary | ICD-10-CM | POA: Diagnosis not present

## 2024-02-03 DIAGNOSIS — D631 Anemia in chronic kidney disease: Secondary | ICD-10-CM | POA: Diagnosis not present

## 2024-02-03 DIAGNOSIS — Z992 Dependence on renal dialysis: Secondary | ICD-10-CM | POA: Diagnosis not present

## 2024-02-03 DIAGNOSIS — N186 End stage renal disease: Secondary | ICD-10-CM | POA: Diagnosis not present

## 2024-02-05 DIAGNOSIS — N2581 Secondary hyperparathyroidism of renal origin: Secondary | ICD-10-CM | POA: Diagnosis not present

## 2024-02-05 DIAGNOSIS — E1122 Type 2 diabetes mellitus with diabetic chronic kidney disease: Secondary | ICD-10-CM | POA: Diagnosis not present

## 2024-02-05 DIAGNOSIS — D631 Anemia in chronic kidney disease: Secondary | ICD-10-CM | POA: Diagnosis not present

## 2024-02-05 DIAGNOSIS — D689 Coagulation defect, unspecified: Secondary | ICD-10-CM | POA: Diagnosis not present

## 2024-02-05 DIAGNOSIS — N186 End stage renal disease: Secondary | ICD-10-CM | POA: Diagnosis not present

## 2024-02-05 DIAGNOSIS — D509 Iron deficiency anemia, unspecified: Secondary | ICD-10-CM | POA: Diagnosis not present

## 2024-02-05 DIAGNOSIS — Z992 Dependence on renal dialysis: Secondary | ICD-10-CM | POA: Diagnosis not present

## 2024-02-07 ENCOUNTER — Other Ambulatory Visit: Payer: Self-pay | Admitting: Internal Medicine

## 2024-02-08 DIAGNOSIS — N186 End stage renal disease: Secondary | ICD-10-CM | POA: Diagnosis not present

## 2024-02-08 DIAGNOSIS — Z992 Dependence on renal dialysis: Secondary | ICD-10-CM | POA: Diagnosis not present

## 2024-02-08 DIAGNOSIS — E1122 Type 2 diabetes mellitus with diabetic chronic kidney disease: Secondary | ICD-10-CM | POA: Diagnosis not present

## 2024-02-08 DIAGNOSIS — D509 Iron deficiency anemia, unspecified: Secondary | ICD-10-CM | POA: Diagnosis not present

## 2024-02-08 DIAGNOSIS — N2581 Secondary hyperparathyroidism of renal origin: Secondary | ICD-10-CM | POA: Diagnosis not present

## 2024-02-08 DIAGNOSIS — D631 Anemia in chronic kidney disease: Secondary | ICD-10-CM | POA: Diagnosis not present

## 2024-02-08 DIAGNOSIS — D689 Coagulation defect, unspecified: Secondary | ICD-10-CM | POA: Diagnosis not present

## 2024-02-10 DIAGNOSIS — E1122 Type 2 diabetes mellitus with diabetic chronic kidney disease: Secondary | ICD-10-CM | POA: Diagnosis not present

## 2024-02-10 DIAGNOSIS — N2581 Secondary hyperparathyroidism of renal origin: Secondary | ICD-10-CM | POA: Diagnosis not present

## 2024-02-10 DIAGNOSIS — D509 Iron deficiency anemia, unspecified: Secondary | ICD-10-CM | POA: Diagnosis not present

## 2024-02-10 DIAGNOSIS — D631 Anemia in chronic kidney disease: Secondary | ICD-10-CM | POA: Diagnosis not present

## 2024-02-10 DIAGNOSIS — N186 End stage renal disease: Secondary | ICD-10-CM | POA: Diagnosis not present

## 2024-02-10 DIAGNOSIS — D689 Coagulation defect, unspecified: Secondary | ICD-10-CM | POA: Diagnosis not present

## 2024-02-10 DIAGNOSIS — Z992 Dependence on renal dialysis: Secondary | ICD-10-CM | POA: Diagnosis not present

## 2024-02-11 ENCOUNTER — Encounter: Payer: Self-pay | Admitting: Podiatry

## 2024-02-11 ENCOUNTER — Ambulatory Visit (INDEPENDENT_AMBULATORY_CARE_PROVIDER_SITE_OTHER): Payer: 59 | Admitting: Podiatry

## 2024-02-11 VITALS — Ht 69.0 in | Wt 130.7 lb

## 2024-02-11 DIAGNOSIS — N186 End stage renal disease: Secondary | ICD-10-CM

## 2024-02-11 DIAGNOSIS — M79675 Pain in left toe(s): Secondary | ICD-10-CM

## 2024-02-11 DIAGNOSIS — M79674 Pain in right toe(s): Secondary | ICD-10-CM | POA: Diagnosis not present

## 2024-02-11 DIAGNOSIS — B351 Tinea unguium: Secondary | ICD-10-CM

## 2024-02-11 NOTE — Progress Notes (Signed)
 This patient returns to my office for at risk foot care.  This patient requires this care by a professional since this patient will be at risk due to having ESRD.  This patient is unable to cut nails himself since the patient cannot reach his nails.These nails are painful walking and wearing shoes.  This patient presents for at risk foot care today.  General Appearance  Alert, conversant and in no acute stress.  Vascular  Dorsalis pedis and posterior tibial  pulses are palpable  bilaterally.  Capillary return is within normal limits  bilaterally. Temperature is within normal limits  bilaterally.  Neurologic  Senn-Weinstein monofilament wire test within normal limits  bilaterally. Muscle power within normal limits bilaterally.  Nails Thick disfigured discolored nails with subungual debris  from hallux to fifth toes bilaterally. No evidence of bacterial infection or drainage bilaterally.  Orthopedic  No limitations of motion  feet .  No crepitus or effusions noted. HAV  B/L.   Skin  normotropic skin with no porokeratosis noted bilaterally.  No signs of infections or ulcers noted.     Onychomycosis  Pain in right toes  Pain in left toes  Consent was obtained for treatment procedures.   Mechanical debridement of nails 1-5  bilaterally performed with a nail nipper.  Filed with dremel without incident.    Return office visit                     Told patient to return for periodic foot care and evaluation due to potential at risk complications.   Helane Gunther DPM

## 2024-02-12 DIAGNOSIS — Z992 Dependence on renal dialysis: Secondary | ICD-10-CM | POA: Diagnosis not present

## 2024-02-12 DIAGNOSIS — N186 End stage renal disease: Secondary | ICD-10-CM | POA: Diagnosis not present

## 2024-02-12 DIAGNOSIS — E1122 Type 2 diabetes mellitus with diabetic chronic kidney disease: Secondary | ICD-10-CM | POA: Diagnosis not present

## 2024-02-12 DIAGNOSIS — N2581 Secondary hyperparathyroidism of renal origin: Secondary | ICD-10-CM | POA: Diagnosis not present

## 2024-02-12 DIAGNOSIS — D631 Anemia in chronic kidney disease: Secondary | ICD-10-CM | POA: Diagnosis not present

## 2024-02-12 DIAGNOSIS — D689 Coagulation defect, unspecified: Secondary | ICD-10-CM | POA: Diagnosis not present

## 2024-02-12 DIAGNOSIS — D509 Iron deficiency anemia, unspecified: Secondary | ICD-10-CM | POA: Diagnosis not present

## 2024-02-15 ENCOUNTER — Ambulatory Visit (INDEPENDENT_AMBULATORY_CARE_PROVIDER_SITE_OTHER): Admitting: Internal Medicine

## 2024-02-15 ENCOUNTER — Encounter: Payer: Self-pay | Admitting: Internal Medicine

## 2024-02-15 VITALS — BP 160/100 | HR 81 | Temp 98.4°F | Ht 69.0 in | Wt 139.0 lb

## 2024-02-15 DIAGNOSIS — E119 Type 2 diabetes mellitus without complications: Secondary | ICD-10-CM

## 2024-02-15 DIAGNOSIS — D509 Iron deficiency anemia, unspecified: Secondary | ICD-10-CM | POA: Diagnosis not present

## 2024-02-15 DIAGNOSIS — I10 Essential (primary) hypertension: Secondary | ICD-10-CM

## 2024-02-15 DIAGNOSIS — E1122 Type 2 diabetes mellitus with diabetic chronic kidney disease: Secondary | ICD-10-CM | POA: Diagnosis not present

## 2024-02-15 DIAGNOSIS — Z0000 Encounter for general adult medical examination without abnormal findings: Secondary | ICD-10-CM | POA: Diagnosis not present

## 2024-02-15 DIAGNOSIS — E785 Hyperlipidemia, unspecified: Secondary | ICD-10-CM

## 2024-02-15 DIAGNOSIS — D689 Coagulation defect, unspecified: Secondary | ICD-10-CM | POA: Diagnosis not present

## 2024-02-15 DIAGNOSIS — D631 Anemia in chronic kidney disease: Secondary | ICD-10-CM | POA: Diagnosis not present

## 2024-02-15 DIAGNOSIS — N186 End stage renal disease: Secondary | ICD-10-CM

## 2024-02-15 DIAGNOSIS — Z992 Dependence on renal dialysis: Secondary | ICD-10-CM | POA: Diagnosis not present

## 2024-02-15 DIAGNOSIS — E1169 Type 2 diabetes mellitus with other specified complication: Secondary | ICD-10-CM

## 2024-02-15 DIAGNOSIS — N2581 Secondary hyperparathyroidism of renal origin: Secondary | ICD-10-CM | POA: Diagnosis not present

## 2024-02-15 DIAGNOSIS — Z720 Tobacco use: Secondary | ICD-10-CM | POA: Diagnosis not present

## 2024-02-15 MED ORDER — CARVEDILOL 25 MG PO TABS: 25.0000 mg | ORAL_TABLET | Freq: Two times a day (BID) | ORAL | 3 refills | Status: AC

## 2024-02-15 MED ORDER — SIMVASTATIN 20 MG PO TABS: ORAL_TABLET | ORAL | 3 refills | Status: AC

## 2024-02-15 MED ORDER — AMLODIPINE BESYLATE 10 MG PO TABS: 10.0000 mg | ORAL_TABLET | Freq: Every day | ORAL | 3 refills | Status: AC

## 2024-02-15 NOTE — Assessment & Plan Note (Signed)
 Is not considering transplant yet but his wife wants him to consider.

## 2024-02-15 NOTE — Assessment & Plan Note (Signed)
 Sugars are doing well not low. He is on dialysis so HgA1c is not indicated. He takes no medication currently.

## 2024-02-15 NOTE — Progress Notes (Signed)
   Subjective:   Patient ID: Levi Hodges, male    DOB: 1975-11-24, 48 y.o.   MRN: [id number]  HPI The patient is a 48 YO man coming in for physical.  PMH, FMH, social history reviewed and updated  Review of Systems  Constitutional:  Positive for fatigue.  HENT: Negative.    Eyes: Negative.   Respiratory:  Negative for cough, chest tightness and shortness of breath.   Cardiovascular:  Negative for chest pain, palpitations and leg swelling.  Gastrointestinal:  Positive for diarrhea. Negative for abdominal distention, abdominal pain, constipation, nausea and vomiting.  Musculoskeletal: Negative.   Skin: Negative.        Red spot on buttocks which is uncomfortable at times but no skin breakdown  Neurological:  Positive for weakness.  Psychiatric/Behavioral: Negative.      Objective:  Physical Exam Constitutional:      Appearance: He is well-developed.  HENT:     Head: Normocephalic and atraumatic.  Cardiovascular:     Rate and Rhythm: Normal rate and regular rhythm.     Comments: Good thrill at access Pulmonary:     Effort: Pulmonary effort is normal. No respiratory distress.     Breath sounds: Normal breath sounds. No wheezing or rales.  Abdominal:     General: Bowel sounds are normal. There is no distension.     Palpations: Abdomen is soft.     Tenderness: There is no abdominal tenderness. There is no rebound.  Musculoskeletal:     Cervical back: Normal range of motion.  Skin:    General: Skin is warm and dry.  Neurological:     Mental Status: He is alert and oriented to person, place, and time.     Coordination: Coordination normal.     Vitals:   02/15/24 0939  BP: (!) 160/100  Pulse: 81  Temp: 98.4 F (36.9 C)  TempSrc: Oral  SpO2: 98%  Weight: 139 lb (63 kg)  Height: 5\' 9"  (1.753 m)    Assessment & Plan:

## 2024-02-15 NOTE — Assessment & Plan Note (Signed)
 Flu shot yearly. Pneumonia counseled. Tetanus up to date. Colonoscopy counseled. Counseled about sun safety and mole surveillance. Counseled about the dangers of distracted driving. Given 10 year screening recommendations.

## 2024-02-15 NOTE — Assessment & Plan Note (Signed)
 Taking simvastatin and refilled today.

## 2024-02-15 NOTE — Assessment & Plan Note (Signed)
 Not able to attempt quit today. We discussed.

## 2024-02-15 NOTE — Assessment & Plan Note (Signed)
 BP high today and he had been taking coreg 12.5 mg BID instead of 25 mg BID which was changed back today. Continue amlodipine 10 mg daily as well.

## 2024-02-17 DIAGNOSIS — Z992 Dependence on renal dialysis: Secondary | ICD-10-CM | POA: Diagnosis not present

## 2024-02-17 DIAGNOSIS — D689 Coagulation defect, unspecified: Secondary | ICD-10-CM | POA: Diagnosis not present

## 2024-02-17 DIAGNOSIS — E1122 Type 2 diabetes mellitus with diabetic chronic kidney disease: Secondary | ICD-10-CM | POA: Diagnosis not present

## 2024-02-17 DIAGNOSIS — N186 End stage renal disease: Secondary | ICD-10-CM | POA: Diagnosis not present

## 2024-02-17 DIAGNOSIS — D509 Iron deficiency anemia, unspecified: Secondary | ICD-10-CM | POA: Diagnosis not present

## 2024-02-17 DIAGNOSIS — N2581 Secondary hyperparathyroidism of renal origin: Secondary | ICD-10-CM | POA: Diagnosis not present

## 2024-02-17 DIAGNOSIS — D631 Anemia in chronic kidney disease: Secondary | ICD-10-CM | POA: Diagnosis not present

## 2024-02-19 DIAGNOSIS — N2581 Secondary hyperparathyroidism of renal origin: Secondary | ICD-10-CM | POA: Diagnosis not present

## 2024-02-19 DIAGNOSIS — D689 Coagulation defect, unspecified: Secondary | ICD-10-CM | POA: Diagnosis not present

## 2024-02-19 DIAGNOSIS — Z992 Dependence on renal dialysis: Secondary | ICD-10-CM | POA: Diagnosis not present

## 2024-02-19 DIAGNOSIS — E1122 Type 2 diabetes mellitus with diabetic chronic kidney disease: Secondary | ICD-10-CM | POA: Diagnosis not present

## 2024-02-19 DIAGNOSIS — N186 End stage renal disease: Secondary | ICD-10-CM | POA: Diagnosis not present

## 2024-02-19 DIAGNOSIS — D631 Anemia in chronic kidney disease: Secondary | ICD-10-CM | POA: Diagnosis not present

## 2024-02-19 DIAGNOSIS — D509 Iron deficiency anemia, unspecified: Secondary | ICD-10-CM | POA: Diagnosis not present

## 2024-02-22 DIAGNOSIS — D509 Iron deficiency anemia, unspecified: Secondary | ICD-10-CM | POA: Diagnosis not present

## 2024-02-22 DIAGNOSIS — E1122 Type 2 diabetes mellitus with diabetic chronic kidney disease: Secondary | ICD-10-CM | POA: Diagnosis not present

## 2024-02-22 DIAGNOSIS — D631 Anemia in chronic kidney disease: Secondary | ICD-10-CM | POA: Diagnosis not present

## 2024-02-22 DIAGNOSIS — Z992 Dependence on renal dialysis: Secondary | ICD-10-CM | POA: Diagnosis not present

## 2024-02-22 DIAGNOSIS — N2581 Secondary hyperparathyroidism of renal origin: Secondary | ICD-10-CM | POA: Diagnosis not present

## 2024-02-22 DIAGNOSIS — N186 End stage renal disease: Secondary | ICD-10-CM | POA: Diagnosis not present

## 2024-02-22 DIAGNOSIS — D689 Coagulation defect, unspecified: Secondary | ICD-10-CM | POA: Diagnosis not present

## 2024-02-26 DIAGNOSIS — E1122 Type 2 diabetes mellitus with diabetic chronic kidney disease: Secondary | ICD-10-CM | POA: Diagnosis not present

## 2024-02-26 DIAGNOSIS — N186 End stage renal disease: Secondary | ICD-10-CM | POA: Diagnosis not present

## 2024-02-26 DIAGNOSIS — Z992 Dependence on renal dialysis: Secondary | ICD-10-CM | POA: Diagnosis not present

## 2024-02-26 DIAGNOSIS — N2581 Secondary hyperparathyroidism of renal origin: Secondary | ICD-10-CM | POA: Diagnosis not present

## 2024-02-26 DIAGNOSIS — D689 Coagulation defect, unspecified: Secondary | ICD-10-CM | POA: Diagnosis not present

## 2024-02-26 DIAGNOSIS — D631 Anemia in chronic kidney disease: Secondary | ICD-10-CM | POA: Diagnosis not present

## 2024-02-26 DIAGNOSIS — D509 Iron deficiency anemia, unspecified: Secondary | ICD-10-CM | POA: Diagnosis not present

## 2024-02-29 DIAGNOSIS — N2581 Secondary hyperparathyroidism of renal origin: Secondary | ICD-10-CM | POA: Diagnosis not present

## 2024-02-29 DIAGNOSIS — D509 Iron deficiency anemia, unspecified: Secondary | ICD-10-CM | POA: Diagnosis not present

## 2024-02-29 DIAGNOSIS — D631 Anemia in chronic kidney disease: Secondary | ICD-10-CM | POA: Diagnosis not present

## 2024-02-29 DIAGNOSIS — D689 Coagulation defect, unspecified: Secondary | ICD-10-CM | POA: Diagnosis not present

## 2024-02-29 DIAGNOSIS — Z992 Dependence on renal dialysis: Secondary | ICD-10-CM | POA: Diagnosis not present

## 2024-02-29 DIAGNOSIS — N186 End stage renal disease: Secondary | ICD-10-CM | POA: Diagnosis not present

## 2024-02-29 DIAGNOSIS — E1122 Type 2 diabetes mellitus with diabetic chronic kidney disease: Secondary | ICD-10-CM | POA: Diagnosis not present

## 2024-03-02 DIAGNOSIS — D509 Iron deficiency anemia, unspecified: Secondary | ICD-10-CM | POA: Diagnosis not present

## 2024-03-02 DIAGNOSIS — N2581 Secondary hyperparathyroidism of renal origin: Secondary | ICD-10-CM | POA: Diagnosis not present

## 2024-03-02 DIAGNOSIS — E1122 Type 2 diabetes mellitus with diabetic chronic kidney disease: Secondary | ICD-10-CM | POA: Diagnosis not present

## 2024-03-02 DIAGNOSIS — Z992 Dependence on renal dialysis: Secondary | ICD-10-CM | POA: Diagnosis not present

## 2024-03-02 DIAGNOSIS — N186 End stage renal disease: Secondary | ICD-10-CM | POA: Diagnosis not present

## 2024-03-02 DIAGNOSIS — D631 Anemia in chronic kidney disease: Secondary | ICD-10-CM | POA: Diagnosis not present

## 2024-03-02 DIAGNOSIS — D689 Coagulation defect, unspecified: Secondary | ICD-10-CM | POA: Diagnosis not present

## 2024-03-04 DIAGNOSIS — N186 End stage renal disease: Secondary | ICD-10-CM | POA: Diagnosis not present

## 2024-03-04 DIAGNOSIS — D509 Iron deficiency anemia, unspecified: Secondary | ICD-10-CM | POA: Diagnosis not present

## 2024-03-04 DIAGNOSIS — N2581 Secondary hyperparathyroidism of renal origin: Secondary | ICD-10-CM | POA: Diagnosis not present

## 2024-03-04 DIAGNOSIS — E1122 Type 2 diabetes mellitus with diabetic chronic kidney disease: Secondary | ICD-10-CM | POA: Diagnosis not present

## 2024-03-04 DIAGNOSIS — Z992 Dependence on renal dialysis: Secondary | ICD-10-CM | POA: Diagnosis not present

## 2024-03-04 DIAGNOSIS — D631 Anemia in chronic kidney disease: Secondary | ICD-10-CM | POA: Diagnosis not present

## 2024-03-04 DIAGNOSIS — D689 Coagulation defect, unspecified: Secondary | ICD-10-CM | POA: Diagnosis not present

## 2024-03-07 DIAGNOSIS — N186 End stage renal disease: Secondary | ICD-10-CM | POA: Diagnosis not present

## 2024-03-07 DIAGNOSIS — D509 Iron deficiency anemia, unspecified: Secondary | ICD-10-CM | POA: Diagnosis not present

## 2024-03-07 DIAGNOSIS — N2581 Secondary hyperparathyroidism of renal origin: Secondary | ICD-10-CM | POA: Diagnosis not present

## 2024-03-07 DIAGNOSIS — E1122 Type 2 diabetes mellitus with diabetic chronic kidney disease: Secondary | ICD-10-CM | POA: Diagnosis not present

## 2024-03-07 DIAGNOSIS — Z992 Dependence on renal dialysis: Secondary | ICD-10-CM | POA: Diagnosis not present

## 2024-03-07 DIAGNOSIS — D631 Anemia in chronic kidney disease: Secondary | ICD-10-CM | POA: Diagnosis not present

## 2024-03-07 DIAGNOSIS — D689 Coagulation defect, unspecified: Secondary | ICD-10-CM | POA: Diagnosis not present

## 2024-03-11 DIAGNOSIS — E1122 Type 2 diabetes mellitus with diabetic chronic kidney disease: Secondary | ICD-10-CM | POA: Diagnosis not present

## 2024-03-11 DIAGNOSIS — N2581 Secondary hyperparathyroidism of renal origin: Secondary | ICD-10-CM | POA: Diagnosis not present

## 2024-03-11 DIAGNOSIS — D689 Coagulation defect, unspecified: Secondary | ICD-10-CM | POA: Diagnosis not present

## 2024-03-11 DIAGNOSIS — D631 Anemia in chronic kidney disease: Secondary | ICD-10-CM | POA: Diagnosis not present

## 2024-03-11 DIAGNOSIS — N186 End stage renal disease: Secondary | ICD-10-CM | POA: Diagnosis not present

## 2024-03-11 DIAGNOSIS — D509 Iron deficiency anemia, unspecified: Secondary | ICD-10-CM | POA: Diagnosis not present

## 2024-03-11 DIAGNOSIS — Z992 Dependence on renal dialysis: Secondary | ICD-10-CM | POA: Diagnosis not present

## 2024-03-16 DIAGNOSIS — D631 Anemia in chronic kidney disease: Secondary | ICD-10-CM | POA: Diagnosis not present

## 2024-03-16 DIAGNOSIS — Z992 Dependence on renal dialysis: Secondary | ICD-10-CM | POA: Diagnosis not present

## 2024-03-16 DIAGNOSIS — D689 Coagulation defect, unspecified: Secondary | ICD-10-CM | POA: Diagnosis not present

## 2024-03-16 DIAGNOSIS — N2581 Secondary hyperparathyroidism of renal origin: Secondary | ICD-10-CM | POA: Diagnosis not present

## 2024-03-16 DIAGNOSIS — N186 End stage renal disease: Secondary | ICD-10-CM | POA: Diagnosis not present

## 2024-03-16 DIAGNOSIS — D509 Iron deficiency anemia, unspecified: Secondary | ICD-10-CM | POA: Diagnosis not present

## 2024-03-16 DIAGNOSIS — E1122 Type 2 diabetes mellitus with diabetic chronic kidney disease: Secondary | ICD-10-CM | POA: Diagnosis not present

## 2024-03-18 DIAGNOSIS — Z992 Dependence on renal dialysis: Secondary | ICD-10-CM | POA: Diagnosis not present

## 2024-03-18 DIAGNOSIS — D509 Iron deficiency anemia, unspecified: Secondary | ICD-10-CM | POA: Diagnosis not present

## 2024-03-18 DIAGNOSIS — E1122 Type 2 diabetes mellitus with diabetic chronic kidney disease: Secondary | ICD-10-CM | POA: Diagnosis not present

## 2024-03-18 DIAGNOSIS — N186 End stage renal disease: Secondary | ICD-10-CM | POA: Diagnosis not present

## 2024-03-18 DIAGNOSIS — D689 Coagulation defect, unspecified: Secondary | ICD-10-CM | POA: Diagnosis not present

## 2024-03-18 DIAGNOSIS — N2581 Secondary hyperparathyroidism of renal origin: Secondary | ICD-10-CM | POA: Diagnosis not present

## 2024-03-18 DIAGNOSIS — D631 Anemia in chronic kidney disease: Secondary | ICD-10-CM | POA: Diagnosis not present

## 2024-03-21 DIAGNOSIS — D689 Coagulation defect, unspecified: Secondary | ICD-10-CM | POA: Diagnosis not present

## 2024-03-21 DIAGNOSIS — D509 Iron deficiency anemia, unspecified: Secondary | ICD-10-CM | POA: Diagnosis not present

## 2024-03-21 DIAGNOSIS — N186 End stage renal disease: Secondary | ICD-10-CM | POA: Diagnosis not present

## 2024-03-21 DIAGNOSIS — N2581 Secondary hyperparathyroidism of renal origin: Secondary | ICD-10-CM | POA: Diagnosis not present

## 2024-03-21 DIAGNOSIS — D631 Anemia in chronic kidney disease: Secondary | ICD-10-CM | POA: Diagnosis not present

## 2024-03-21 DIAGNOSIS — E1122 Type 2 diabetes mellitus with diabetic chronic kidney disease: Secondary | ICD-10-CM | POA: Diagnosis not present

## 2024-03-21 DIAGNOSIS — Z992 Dependence on renal dialysis: Secondary | ICD-10-CM | POA: Diagnosis not present

## 2024-03-23 DIAGNOSIS — N2581 Secondary hyperparathyroidism of renal origin: Secondary | ICD-10-CM | POA: Diagnosis not present

## 2024-03-23 DIAGNOSIS — Z992 Dependence on renal dialysis: Secondary | ICD-10-CM | POA: Diagnosis not present

## 2024-03-23 DIAGNOSIS — E1122 Type 2 diabetes mellitus with diabetic chronic kidney disease: Secondary | ICD-10-CM | POA: Diagnosis not present

## 2024-03-23 DIAGNOSIS — D509 Iron deficiency anemia, unspecified: Secondary | ICD-10-CM | POA: Diagnosis not present

## 2024-03-23 DIAGNOSIS — D689 Coagulation defect, unspecified: Secondary | ICD-10-CM | POA: Diagnosis not present

## 2024-03-23 DIAGNOSIS — D631 Anemia in chronic kidney disease: Secondary | ICD-10-CM | POA: Diagnosis not present

## 2024-03-23 DIAGNOSIS — N186 End stage renal disease: Secondary | ICD-10-CM | POA: Diagnosis not present

## 2024-03-25 DIAGNOSIS — D631 Anemia in chronic kidney disease: Secondary | ICD-10-CM | POA: Diagnosis not present

## 2024-03-25 DIAGNOSIS — Z992 Dependence on renal dialysis: Secondary | ICD-10-CM | POA: Diagnosis not present

## 2024-03-25 DIAGNOSIS — D689 Coagulation defect, unspecified: Secondary | ICD-10-CM | POA: Diagnosis not present

## 2024-03-25 DIAGNOSIS — D509 Iron deficiency anemia, unspecified: Secondary | ICD-10-CM | POA: Diagnosis not present

## 2024-03-25 DIAGNOSIS — N2581 Secondary hyperparathyroidism of renal origin: Secondary | ICD-10-CM | POA: Diagnosis not present

## 2024-03-25 DIAGNOSIS — N186 End stage renal disease: Secondary | ICD-10-CM | POA: Diagnosis not present

## 2024-03-25 DIAGNOSIS — E1122 Type 2 diabetes mellitus with diabetic chronic kidney disease: Secondary | ICD-10-CM | POA: Diagnosis not present

## 2024-03-28 DIAGNOSIS — D689 Coagulation defect, unspecified: Secondary | ICD-10-CM | POA: Diagnosis not present

## 2024-03-28 DIAGNOSIS — Z992 Dependence on renal dialysis: Secondary | ICD-10-CM | POA: Diagnosis not present

## 2024-03-28 DIAGNOSIS — E1122 Type 2 diabetes mellitus with diabetic chronic kidney disease: Secondary | ICD-10-CM | POA: Diagnosis not present

## 2024-03-28 DIAGNOSIS — N2581 Secondary hyperparathyroidism of renal origin: Secondary | ICD-10-CM | POA: Diagnosis not present

## 2024-03-28 DIAGNOSIS — D509 Iron deficiency anemia, unspecified: Secondary | ICD-10-CM | POA: Diagnosis not present

## 2024-03-28 DIAGNOSIS — N186 End stage renal disease: Secondary | ICD-10-CM | POA: Diagnosis not present

## 2024-03-28 DIAGNOSIS — D631 Anemia in chronic kidney disease: Secondary | ICD-10-CM | POA: Diagnosis not present

## 2024-04-01 DIAGNOSIS — E1122 Type 2 diabetes mellitus with diabetic chronic kidney disease: Secondary | ICD-10-CM | POA: Diagnosis not present

## 2024-04-01 DIAGNOSIS — Z992 Dependence on renal dialysis: Secondary | ICD-10-CM | POA: Diagnosis not present

## 2024-04-01 DIAGNOSIS — D631 Anemia in chronic kidney disease: Secondary | ICD-10-CM | POA: Diagnosis not present

## 2024-04-01 DIAGNOSIS — N186 End stage renal disease: Secondary | ICD-10-CM | POA: Diagnosis not present

## 2024-04-01 DIAGNOSIS — D689 Coagulation defect, unspecified: Secondary | ICD-10-CM | POA: Diagnosis not present

## 2024-04-01 DIAGNOSIS — D509 Iron deficiency anemia, unspecified: Secondary | ICD-10-CM | POA: Diagnosis not present

## 2024-04-01 DIAGNOSIS — N2581 Secondary hyperparathyroidism of renal origin: Secondary | ICD-10-CM | POA: Diagnosis not present

## 2024-04-02 DIAGNOSIS — N186 End stage renal disease: Secondary | ICD-10-CM | POA: Diagnosis not present

## 2024-04-02 DIAGNOSIS — E1122 Type 2 diabetes mellitus with diabetic chronic kidney disease: Secondary | ICD-10-CM | POA: Diagnosis not present

## 2024-04-02 DIAGNOSIS — Z992 Dependence on renal dialysis: Secondary | ICD-10-CM | POA: Diagnosis not present

## 2024-04-04 DIAGNOSIS — D509 Iron deficiency anemia, unspecified: Secondary | ICD-10-CM | POA: Diagnosis not present

## 2024-04-04 DIAGNOSIS — D631 Anemia in chronic kidney disease: Secondary | ICD-10-CM | POA: Diagnosis not present

## 2024-04-04 DIAGNOSIS — E1122 Type 2 diabetes mellitus with diabetic chronic kidney disease: Secondary | ICD-10-CM | POA: Diagnosis not present

## 2024-04-04 DIAGNOSIS — N2581 Secondary hyperparathyroidism of renal origin: Secondary | ICD-10-CM | POA: Diagnosis not present

## 2024-04-04 DIAGNOSIS — N186 End stage renal disease: Secondary | ICD-10-CM | POA: Diagnosis not present

## 2024-04-04 DIAGNOSIS — Z992 Dependence on renal dialysis: Secondary | ICD-10-CM | POA: Diagnosis not present

## 2024-04-04 DIAGNOSIS — D689 Coagulation defect, unspecified: Secondary | ICD-10-CM | POA: Diagnosis not present

## 2024-04-06 DIAGNOSIS — Z992 Dependence on renal dialysis: Secondary | ICD-10-CM | POA: Diagnosis not present

## 2024-04-06 DIAGNOSIS — E1122 Type 2 diabetes mellitus with diabetic chronic kidney disease: Secondary | ICD-10-CM | POA: Diagnosis not present

## 2024-04-06 DIAGNOSIS — D509 Iron deficiency anemia, unspecified: Secondary | ICD-10-CM | POA: Diagnosis not present

## 2024-04-06 DIAGNOSIS — D689 Coagulation defect, unspecified: Secondary | ICD-10-CM | POA: Diagnosis not present

## 2024-04-06 DIAGNOSIS — D631 Anemia in chronic kidney disease: Secondary | ICD-10-CM | POA: Diagnosis not present

## 2024-04-06 DIAGNOSIS — N2581 Secondary hyperparathyroidism of renal origin: Secondary | ICD-10-CM | POA: Diagnosis not present

## 2024-04-06 DIAGNOSIS — N186 End stage renal disease: Secondary | ICD-10-CM | POA: Diagnosis not present

## 2024-04-08 DIAGNOSIS — D509 Iron deficiency anemia, unspecified: Secondary | ICD-10-CM | POA: Diagnosis not present

## 2024-04-08 DIAGNOSIS — D631 Anemia in chronic kidney disease: Secondary | ICD-10-CM | POA: Diagnosis not present

## 2024-04-08 DIAGNOSIS — Z992 Dependence on renal dialysis: Secondary | ICD-10-CM | POA: Diagnosis not present

## 2024-04-08 DIAGNOSIS — N186 End stage renal disease: Secondary | ICD-10-CM | POA: Diagnosis not present

## 2024-04-08 DIAGNOSIS — D689 Coagulation defect, unspecified: Secondary | ICD-10-CM | POA: Diagnosis not present

## 2024-04-08 DIAGNOSIS — E1122 Type 2 diabetes mellitus with diabetic chronic kidney disease: Secondary | ICD-10-CM | POA: Diagnosis not present

## 2024-04-08 DIAGNOSIS — N2581 Secondary hyperparathyroidism of renal origin: Secondary | ICD-10-CM | POA: Diagnosis not present

## 2024-04-11 DIAGNOSIS — N186 End stage renal disease: Secondary | ICD-10-CM | POA: Diagnosis not present

## 2024-04-11 DIAGNOSIS — D689 Coagulation defect, unspecified: Secondary | ICD-10-CM | POA: Diagnosis not present

## 2024-04-11 DIAGNOSIS — D509 Iron deficiency anemia, unspecified: Secondary | ICD-10-CM | POA: Diagnosis not present

## 2024-04-11 DIAGNOSIS — D631 Anemia in chronic kidney disease: Secondary | ICD-10-CM | POA: Diagnosis not present

## 2024-04-11 DIAGNOSIS — Z992 Dependence on renal dialysis: Secondary | ICD-10-CM | POA: Diagnosis not present

## 2024-04-11 DIAGNOSIS — N2581 Secondary hyperparathyroidism of renal origin: Secondary | ICD-10-CM | POA: Diagnosis not present

## 2024-04-11 DIAGNOSIS — E1122 Type 2 diabetes mellitus with diabetic chronic kidney disease: Secondary | ICD-10-CM | POA: Diagnosis not present

## 2024-04-13 DIAGNOSIS — D509 Iron deficiency anemia, unspecified: Secondary | ICD-10-CM | POA: Diagnosis not present

## 2024-04-13 DIAGNOSIS — D689 Coagulation defect, unspecified: Secondary | ICD-10-CM | POA: Diagnosis not present

## 2024-04-13 DIAGNOSIS — Z992 Dependence on renal dialysis: Secondary | ICD-10-CM | POA: Diagnosis not present

## 2024-04-13 DIAGNOSIS — E1122 Type 2 diabetes mellitus with diabetic chronic kidney disease: Secondary | ICD-10-CM | POA: Diagnosis not present

## 2024-04-13 DIAGNOSIS — N2581 Secondary hyperparathyroidism of renal origin: Secondary | ICD-10-CM | POA: Diagnosis not present

## 2024-04-13 DIAGNOSIS — N186 End stage renal disease: Secondary | ICD-10-CM | POA: Diagnosis not present

## 2024-04-13 DIAGNOSIS — D631 Anemia in chronic kidney disease: Secondary | ICD-10-CM | POA: Diagnosis not present

## 2024-04-15 DIAGNOSIS — E1122 Type 2 diabetes mellitus with diabetic chronic kidney disease: Secondary | ICD-10-CM | POA: Diagnosis not present

## 2024-04-15 DIAGNOSIS — D631 Anemia in chronic kidney disease: Secondary | ICD-10-CM | POA: Diagnosis not present

## 2024-04-15 DIAGNOSIS — D689 Coagulation defect, unspecified: Secondary | ICD-10-CM | POA: Diagnosis not present

## 2024-04-15 DIAGNOSIS — N186 End stage renal disease: Secondary | ICD-10-CM | POA: Diagnosis not present

## 2024-04-15 DIAGNOSIS — Z992 Dependence on renal dialysis: Secondary | ICD-10-CM | POA: Diagnosis not present

## 2024-04-15 DIAGNOSIS — D509 Iron deficiency anemia, unspecified: Secondary | ICD-10-CM | POA: Diagnosis not present

## 2024-04-15 DIAGNOSIS — N2581 Secondary hyperparathyroidism of renal origin: Secondary | ICD-10-CM | POA: Diagnosis not present

## 2024-04-18 DIAGNOSIS — D631 Anemia in chronic kidney disease: Secondary | ICD-10-CM | POA: Diagnosis not present

## 2024-04-18 DIAGNOSIS — D509 Iron deficiency anemia, unspecified: Secondary | ICD-10-CM | POA: Diagnosis not present

## 2024-04-18 DIAGNOSIS — D689 Coagulation defect, unspecified: Secondary | ICD-10-CM | POA: Diagnosis not present

## 2024-04-18 DIAGNOSIS — E1122 Type 2 diabetes mellitus with diabetic chronic kidney disease: Secondary | ICD-10-CM | POA: Diagnosis not present

## 2024-04-18 DIAGNOSIS — Z992 Dependence on renal dialysis: Secondary | ICD-10-CM | POA: Diagnosis not present

## 2024-04-18 DIAGNOSIS — N186 End stage renal disease: Secondary | ICD-10-CM | POA: Diagnosis not present

## 2024-04-18 DIAGNOSIS — N2581 Secondary hyperparathyroidism of renal origin: Secondary | ICD-10-CM | POA: Diagnosis not present

## 2024-04-20 DIAGNOSIS — E1122 Type 2 diabetes mellitus with diabetic chronic kidney disease: Secondary | ICD-10-CM | POA: Diagnosis not present

## 2024-04-20 DIAGNOSIS — N2581 Secondary hyperparathyroidism of renal origin: Secondary | ICD-10-CM | POA: Diagnosis not present

## 2024-04-20 DIAGNOSIS — D689 Coagulation defect, unspecified: Secondary | ICD-10-CM | POA: Diagnosis not present

## 2024-04-20 DIAGNOSIS — N186 End stage renal disease: Secondary | ICD-10-CM | POA: Diagnosis not present

## 2024-04-20 DIAGNOSIS — Z992 Dependence on renal dialysis: Secondary | ICD-10-CM | POA: Diagnosis not present

## 2024-04-20 DIAGNOSIS — D631 Anemia in chronic kidney disease: Secondary | ICD-10-CM | POA: Diagnosis not present

## 2024-04-20 DIAGNOSIS — D509 Iron deficiency anemia, unspecified: Secondary | ICD-10-CM | POA: Diagnosis not present

## 2024-04-22 DIAGNOSIS — N2581 Secondary hyperparathyroidism of renal origin: Secondary | ICD-10-CM | POA: Diagnosis not present

## 2024-04-22 DIAGNOSIS — D689 Coagulation defect, unspecified: Secondary | ICD-10-CM | POA: Diagnosis not present

## 2024-04-22 DIAGNOSIS — Z992 Dependence on renal dialysis: Secondary | ICD-10-CM | POA: Diagnosis not present

## 2024-04-22 DIAGNOSIS — D631 Anemia in chronic kidney disease: Secondary | ICD-10-CM | POA: Diagnosis not present

## 2024-04-22 DIAGNOSIS — N186 End stage renal disease: Secondary | ICD-10-CM | POA: Diagnosis not present

## 2024-04-22 DIAGNOSIS — D509 Iron deficiency anemia, unspecified: Secondary | ICD-10-CM | POA: Diagnosis not present

## 2024-04-22 DIAGNOSIS — E1122 Type 2 diabetes mellitus with diabetic chronic kidney disease: Secondary | ICD-10-CM | POA: Diagnosis not present

## 2024-04-25 DIAGNOSIS — D689 Coagulation defect, unspecified: Secondary | ICD-10-CM | POA: Diagnosis not present

## 2024-04-25 DIAGNOSIS — N186 End stage renal disease: Secondary | ICD-10-CM | POA: Diagnosis not present

## 2024-04-25 DIAGNOSIS — N2581 Secondary hyperparathyroidism of renal origin: Secondary | ICD-10-CM | POA: Diagnosis not present

## 2024-04-25 DIAGNOSIS — D631 Anemia in chronic kidney disease: Secondary | ICD-10-CM | POA: Diagnosis not present

## 2024-04-25 DIAGNOSIS — D509 Iron deficiency anemia, unspecified: Secondary | ICD-10-CM | POA: Diagnosis not present

## 2024-04-25 DIAGNOSIS — Z992 Dependence on renal dialysis: Secondary | ICD-10-CM | POA: Diagnosis not present

## 2024-04-25 DIAGNOSIS — E1122 Type 2 diabetes mellitus with diabetic chronic kidney disease: Secondary | ICD-10-CM | POA: Diagnosis not present

## 2024-04-29 DIAGNOSIS — D689 Coagulation defect, unspecified: Secondary | ICD-10-CM | POA: Diagnosis not present

## 2024-04-29 DIAGNOSIS — N186 End stage renal disease: Secondary | ICD-10-CM | POA: Diagnosis not present

## 2024-04-29 DIAGNOSIS — E1122 Type 2 diabetes mellitus with diabetic chronic kidney disease: Secondary | ICD-10-CM | POA: Diagnosis not present

## 2024-04-29 DIAGNOSIS — N2581 Secondary hyperparathyroidism of renal origin: Secondary | ICD-10-CM | POA: Diagnosis not present

## 2024-04-29 DIAGNOSIS — D631 Anemia in chronic kidney disease: Secondary | ICD-10-CM | POA: Diagnosis not present

## 2024-04-29 DIAGNOSIS — D509 Iron deficiency anemia, unspecified: Secondary | ICD-10-CM | POA: Diagnosis not present

## 2024-04-29 DIAGNOSIS — Z992 Dependence on renal dialysis: Secondary | ICD-10-CM | POA: Diagnosis not present

## 2024-05-02 DIAGNOSIS — D631 Anemia in chronic kidney disease: Secondary | ICD-10-CM | POA: Diagnosis not present

## 2024-05-02 DIAGNOSIS — D509 Iron deficiency anemia, unspecified: Secondary | ICD-10-CM | POA: Diagnosis not present

## 2024-05-02 DIAGNOSIS — Z992 Dependence on renal dialysis: Secondary | ICD-10-CM | POA: Diagnosis not present

## 2024-05-02 DIAGNOSIS — N186 End stage renal disease: Secondary | ICD-10-CM | POA: Diagnosis not present

## 2024-05-02 DIAGNOSIS — E1122 Type 2 diabetes mellitus with diabetic chronic kidney disease: Secondary | ICD-10-CM | POA: Diagnosis not present

## 2024-05-02 DIAGNOSIS — N2581 Secondary hyperparathyroidism of renal origin: Secondary | ICD-10-CM | POA: Diagnosis not present

## 2024-05-02 DIAGNOSIS — D689 Coagulation defect, unspecified: Secondary | ICD-10-CM | POA: Diagnosis not present

## 2024-05-04 DIAGNOSIS — D631 Anemia in chronic kidney disease: Secondary | ICD-10-CM | POA: Diagnosis not present

## 2024-05-04 DIAGNOSIS — D509 Iron deficiency anemia, unspecified: Secondary | ICD-10-CM | POA: Diagnosis not present

## 2024-05-04 DIAGNOSIS — D689 Coagulation defect, unspecified: Secondary | ICD-10-CM | POA: Diagnosis not present

## 2024-05-04 DIAGNOSIS — E1122 Type 2 diabetes mellitus with diabetic chronic kidney disease: Secondary | ICD-10-CM | POA: Diagnosis not present

## 2024-05-04 DIAGNOSIS — Z992 Dependence on renal dialysis: Secondary | ICD-10-CM | POA: Diagnosis not present

## 2024-05-04 DIAGNOSIS — N2581 Secondary hyperparathyroidism of renal origin: Secondary | ICD-10-CM | POA: Diagnosis not present

## 2024-05-04 DIAGNOSIS — N186 End stage renal disease: Secondary | ICD-10-CM | POA: Diagnosis not present

## 2024-05-06 DIAGNOSIS — D509 Iron deficiency anemia, unspecified: Secondary | ICD-10-CM | POA: Diagnosis not present

## 2024-05-06 DIAGNOSIS — E1122 Type 2 diabetes mellitus with diabetic chronic kidney disease: Secondary | ICD-10-CM | POA: Diagnosis not present

## 2024-05-06 DIAGNOSIS — N186 End stage renal disease: Secondary | ICD-10-CM | POA: Diagnosis not present

## 2024-05-06 DIAGNOSIS — D631 Anemia in chronic kidney disease: Secondary | ICD-10-CM | POA: Diagnosis not present

## 2024-05-06 DIAGNOSIS — N2581 Secondary hyperparathyroidism of renal origin: Secondary | ICD-10-CM | POA: Diagnosis not present

## 2024-05-06 DIAGNOSIS — D689 Coagulation defect, unspecified: Secondary | ICD-10-CM | POA: Diagnosis not present

## 2024-05-06 DIAGNOSIS — Z992 Dependence on renal dialysis: Secondary | ICD-10-CM | POA: Diagnosis not present

## 2024-05-09 DIAGNOSIS — Z992 Dependence on renal dialysis: Secondary | ICD-10-CM | POA: Diagnosis not present

## 2024-05-09 DIAGNOSIS — D631 Anemia in chronic kidney disease: Secondary | ICD-10-CM | POA: Diagnosis not present

## 2024-05-09 DIAGNOSIS — D689 Coagulation defect, unspecified: Secondary | ICD-10-CM | POA: Diagnosis not present

## 2024-05-09 DIAGNOSIS — E1122 Type 2 diabetes mellitus with diabetic chronic kidney disease: Secondary | ICD-10-CM | POA: Diagnosis not present

## 2024-05-09 DIAGNOSIS — N186 End stage renal disease: Secondary | ICD-10-CM | POA: Diagnosis not present

## 2024-05-09 DIAGNOSIS — D509 Iron deficiency anemia, unspecified: Secondary | ICD-10-CM | POA: Diagnosis not present

## 2024-05-09 DIAGNOSIS — N2581 Secondary hyperparathyroidism of renal origin: Secondary | ICD-10-CM | POA: Diagnosis not present

## 2024-05-11 DIAGNOSIS — D509 Iron deficiency anemia, unspecified: Secondary | ICD-10-CM | POA: Diagnosis not present

## 2024-05-11 DIAGNOSIS — N186 End stage renal disease: Secondary | ICD-10-CM | POA: Diagnosis not present

## 2024-05-11 DIAGNOSIS — Z992 Dependence on renal dialysis: Secondary | ICD-10-CM | POA: Diagnosis not present

## 2024-05-11 DIAGNOSIS — E1122 Type 2 diabetes mellitus with diabetic chronic kidney disease: Secondary | ICD-10-CM | POA: Diagnosis not present

## 2024-05-11 DIAGNOSIS — D631 Anemia in chronic kidney disease: Secondary | ICD-10-CM | POA: Diagnosis not present

## 2024-05-11 DIAGNOSIS — N2581 Secondary hyperparathyroidism of renal origin: Secondary | ICD-10-CM | POA: Diagnosis not present

## 2024-05-11 DIAGNOSIS — D689 Coagulation defect, unspecified: Secondary | ICD-10-CM | POA: Diagnosis not present

## 2024-05-13 DIAGNOSIS — N2581 Secondary hyperparathyroidism of renal origin: Secondary | ICD-10-CM | POA: Diagnosis not present

## 2024-05-13 DIAGNOSIS — N186 End stage renal disease: Secondary | ICD-10-CM | POA: Diagnosis not present

## 2024-05-13 DIAGNOSIS — D509 Iron deficiency anemia, unspecified: Secondary | ICD-10-CM | POA: Diagnosis not present

## 2024-05-13 DIAGNOSIS — Z992 Dependence on renal dialysis: Secondary | ICD-10-CM | POA: Diagnosis not present

## 2024-05-13 DIAGNOSIS — E1122 Type 2 diabetes mellitus with diabetic chronic kidney disease: Secondary | ICD-10-CM | POA: Diagnosis not present

## 2024-05-13 DIAGNOSIS — D689 Coagulation defect, unspecified: Secondary | ICD-10-CM | POA: Diagnosis not present

## 2024-05-13 DIAGNOSIS — D631 Anemia in chronic kidney disease: Secondary | ICD-10-CM | POA: Diagnosis not present

## 2024-05-16 DIAGNOSIS — N186 End stage renal disease: Secondary | ICD-10-CM | POA: Diagnosis not present

## 2024-05-16 DIAGNOSIS — Z992 Dependence on renal dialysis: Secondary | ICD-10-CM | POA: Diagnosis not present

## 2024-05-16 DIAGNOSIS — D631 Anemia in chronic kidney disease: Secondary | ICD-10-CM | POA: Diagnosis not present

## 2024-05-16 DIAGNOSIS — N2581 Secondary hyperparathyroidism of renal origin: Secondary | ICD-10-CM | POA: Diagnosis not present

## 2024-05-16 DIAGNOSIS — E1122 Type 2 diabetes mellitus with diabetic chronic kidney disease: Secondary | ICD-10-CM | POA: Diagnosis not present

## 2024-05-16 DIAGNOSIS — D689 Coagulation defect, unspecified: Secondary | ICD-10-CM | POA: Diagnosis not present

## 2024-05-16 DIAGNOSIS — D509 Iron deficiency anemia, unspecified: Secondary | ICD-10-CM | POA: Diagnosis not present

## 2024-05-19 ENCOUNTER — Ambulatory Visit: Admitting: Podiatry

## 2024-05-20 DIAGNOSIS — D509 Iron deficiency anemia, unspecified: Secondary | ICD-10-CM | POA: Diagnosis not present

## 2024-05-20 DIAGNOSIS — D689 Coagulation defect, unspecified: Secondary | ICD-10-CM | POA: Diagnosis not present

## 2024-05-20 DIAGNOSIS — Z992 Dependence on renal dialysis: Secondary | ICD-10-CM | POA: Diagnosis not present

## 2024-05-20 DIAGNOSIS — E1122 Type 2 diabetes mellitus with diabetic chronic kidney disease: Secondary | ICD-10-CM | POA: Diagnosis not present

## 2024-05-20 DIAGNOSIS — N2581 Secondary hyperparathyroidism of renal origin: Secondary | ICD-10-CM | POA: Diagnosis not present

## 2024-05-20 DIAGNOSIS — D631 Anemia in chronic kidney disease: Secondary | ICD-10-CM | POA: Diagnosis not present

## 2024-05-20 DIAGNOSIS — N186 End stage renal disease: Secondary | ICD-10-CM | POA: Diagnosis not present

## 2024-05-23 DIAGNOSIS — Z992 Dependence on renal dialysis: Secondary | ICD-10-CM | POA: Diagnosis not present

## 2024-05-23 DIAGNOSIS — N2581 Secondary hyperparathyroidism of renal origin: Secondary | ICD-10-CM | POA: Diagnosis not present

## 2024-05-23 DIAGNOSIS — E1122 Type 2 diabetes mellitus with diabetic chronic kidney disease: Secondary | ICD-10-CM | POA: Diagnosis not present

## 2024-05-23 DIAGNOSIS — D509 Iron deficiency anemia, unspecified: Secondary | ICD-10-CM | POA: Diagnosis not present

## 2024-05-23 DIAGNOSIS — N186 End stage renal disease: Secondary | ICD-10-CM | POA: Diagnosis not present

## 2024-05-23 DIAGNOSIS — D689 Coagulation defect, unspecified: Secondary | ICD-10-CM | POA: Diagnosis not present

## 2024-05-23 DIAGNOSIS — D631 Anemia in chronic kidney disease: Secondary | ICD-10-CM | POA: Diagnosis not present

## 2024-05-25 DIAGNOSIS — Z992 Dependence on renal dialysis: Secondary | ICD-10-CM | POA: Diagnosis not present

## 2024-05-25 DIAGNOSIS — N186 End stage renal disease: Secondary | ICD-10-CM | POA: Diagnosis not present

## 2024-05-25 DIAGNOSIS — D509 Iron deficiency anemia, unspecified: Secondary | ICD-10-CM | POA: Diagnosis not present

## 2024-05-25 DIAGNOSIS — D631 Anemia in chronic kidney disease: Secondary | ICD-10-CM | POA: Diagnosis not present

## 2024-05-25 DIAGNOSIS — N2581 Secondary hyperparathyroidism of renal origin: Secondary | ICD-10-CM | POA: Diagnosis not present

## 2024-05-25 DIAGNOSIS — E1122 Type 2 diabetes mellitus with diabetic chronic kidney disease: Secondary | ICD-10-CM | POA: Diagnosis not present

## 2024-05-25 DIAGNOSIS — D689 Coagulation defect, unspecified: Secondary | ICD-10-CM | POA: Diagnosis not present

## 2024-05-27 DIAGNOSIS — Z992 Dependence on renal dialysis: Secondary | ICD-10-CM | POA: Diagnosis not present

## 2024-05-27 DIAGNOSIS — N2581 Secondary hyperparathyroidism of renal origin: Secondary | ICD-10-CM | POA: Diagnosis not present

## 2024-05-27 DIAGNOSIS — D509 Iron deficiency anemia, unspecified: Secondary | ICD-10-CM | POA: Diagnosis not present

## 2024-05-27 DIAGNOSIS — E1122 Type 2 diabetes mellitus with diabetic chronic kidney disease: Secondary | ICD-10-CM | POA: Diagnosis not present

## 2024-05-27 DIAGNOSIS — N186 End stage renal disease: Secondary | ICD-10-CM | POA: Diagnosis not present

## 2024-05-27 DIAGNOSIS — D631 Anemia in chronic kidney disease: Secondary | ICD-10-CM | POA: Diagnosis not present

## 2024-05-27 DIAGNOSIS — D689 Coagulation defect, unspecified: Secondary | ICD-10-CM | POA: Diagnosis not present

## 2024-05-30 DIAGNOSIS — N186 End stage renal disease: Secondary | ICD-10-CM | POA: Diagnosis not present

## 2024-05-30 DIAGNOSIS — Z992 Dependence on renal dialysis: Secondary | ICD-10-CM | POA: Diagnosis not present

## 2024-05-30 DIAGNOSIS — N2581 Secondary hyperparathyroidism of renal origin: Secondary | ICD-10-CM | POA: Diagnosis not present

## 2024-05-30 DIAGNOSIS — D631 Anemia in chronic kidney disease: Secondary | ICD-10-CM | POA: Diagnosis not present

## 2024-05-30 DIAGNOSIS — E1122 Type 2 diabetes mellitus with diabetic chronic kidney disease: Secondary | ICD-10-CM | POA: Diagnosis not present

## 2024-05-30 DIAGNOSIS — D509 Iron deficiency anemia, unspecified: Secondary | ICD-10-CM | POA: Diagnosis not present

## 2024-05-30 DIAGNOSIS — D689 Coagulation defect, unspecified: Secondary | ICD-10-CM | POA: Diagnosis not present

## 2024-06-01 DIAGNOSIS — E1122 Type 2 diabetes mellitus with diabetic chronic kidney disease: Secondary | ICD-10-CM | POA: Diagnosis not present

## 2024-06-01 DIAGNOSIS — N2581 Secondary hyperparathyroidism of renal origin: Secondary | ICD-10-CM | POA: Diagnosis not present

## 2024-06-01 DIAGNOSIS — D509 Iron deficiency anemia, unspecified: Secondary | ICD-10-CM | POA: Diagnosis not present

## 2024-06-01 DIAGNOSIS — N186 End stage renal disease: Secondary | ICD-10-CM | POA: Diagnosis not present

## 2024-06-01 DIAGNOSIS — Z992 Dependence on renal dialysis: Secondary | ICD-10-CM | POA: Diagnosis not present

## 2024-06-01 DIAGNOSIS — D631 Anemia in chronic kidney disease: Secondary | ICD-10-CM | POA: Diagnosis not present

## 2024-06-01 DIAGNOSIS — D689 Coagulation defect, unspecified: Secondary | ICD-10-CM | POA: Diagnosis not present

## 2024-06-02 DIAGNOSIS — E1122 Type 2 diabetes mellitus with diabetic chronic kidney disease: Secondary | ICD-10-CM | POA: Diagnosis not present

## 2024-06-02 DIAGNOSIS — N186 End stage renal disease: Secondary | ICD-10-CM | POA: Diagnosis not present

## 2024-06-02 DIAGNOSIS — Z992 Dependence on renal dialysis: Secondary | ICD-10-CM | POA: Diagnosis not present

## 2024-06-03 DIAGNOSIS — D509 Iron deficiency anemia, unspecified: Secondary | ICD-10-CM | POA: Diagnosis not present

## 2024-06-03 DIAGNOSIS — N2581 Secondary hyperparathyroidism of renal origin: Secondary | ICD-10-CM | POA: Diagnosis not present

## 2024-06-03 DIAGNOSIS — D689 Coagulation defect, unspecified: Secondary | ICD-10-CM | POA: Diagnosis not present

## 2024-06-03 DIAGNOSIS — Z992 Dependence on renal dialysis: Secondary | ICD-10-CM | POA: Diagnosis not present

## 2024-06-03 DIAGNOSIS — N186 End stage renal disease: Secondary | ICD-10-CM | POA: Diagnosis not present

## 2024-06-03 DIAGNOSIS — D631 Anemia in chronic kidney disease: Secondary | ICD-10-CM | POA: Diagnosis not present

## 2024-06-03 DIAGNOSIS — E1122 Type 2 diabetes mellitus with diabetic chronic kidney disease: Secondary | ICD-10-CM | POA: Diagnosis not present

## 2024-06-17 DIAGNOSIS — E1122 Type 2 diabetes mellitus with diabetic chronic kidney disease: Secondary | ICD-10-CM | POA: Diagnosis not present

## 2024-06-20 DIAGNOSIS — N186 End stage renal disease: Secondary | ICD-10-CM | POA: Diagnosis not present

## 2024-06-20 DIAGNOSIS — Z992 Dependence on renal dialysis: Secondary | ICD-10-CM | POA: Diagnosis not present

## 2024-06-20 DIAGNOSIS — N2581 Secondary hyperparathyroidism of renal origin: Secondary | ICD-10-CM | POA: Diagnosis not present

## 2024-06-20 DIAGNOSIS — E1122 Type 2 diabetes mellitus with diabetic chronic kidney disease: Secondary | ICD-10-CM | POA: Diagnosis not present

## 2024-06-20 DIAGNOSIS — D689 Coagulation defect, unspecified: Secondary | ICD-10-CM | POA: Diagnosis not present

## 2024-06-20 DIAGNOSIS — D509 Iron deficiency anemia, unspecified: Secondary | ICD-10-CM | POA: Diagnosis not present

## 2024-06-21 ENCOUNTER — Ambulatory Visit (INDEPENDENT_AMBULATORY_CARE_PROVIDER_SITE_OTHER): Admitting: Podiatry

## 2024-06-21 DIAGNOSIS — B351 Tinea unguium: Secondary | ICD-10-CM

## 2024-06-21 DIAGNOSIS — M79671 Pain in right foot: Secondary | ICD-10-CM

## 2024-06-21 DIAGNOSIS — M79672 Pain in left foot: Secondary | ICD-10-CM

## 2024-06-21 NOTE — Progress Notes (Signed)
 Patient presents for evaluation and treatment of tenderness and some redness around nails feet.  Tenderness around toes with walking and wearing shoes.  Physical exam:  General appearance: Alert, pleasant, and in no acute distress.  Vascular: Pedal pulses: DP 2/4 B/L, PT 1/4 B/L. mIld edema lower legs bilaterally  Neurological:    Dermatologic:  Nails thickened, disfigured, discolored 1-5 BL with subungual debris.  Redness and hypertrophic nail folds along nail folds bilaterally but no signs of drainage or infection.  Musculoskeletal:     Diagnosis: 1. Painful onychomycotic nails 1 through 5 bilaterally. 2. Pain toes 1 through 5 bilaterally.  Plan: Debrided onychomycotic nails 1 through 5 bilaterally.  Return 3 months [facility]

## 2024-06-22 DIAGNOSIS — N186 End stage renal disease: Secondary | ICD-10-CM | POA: Diagnosis not present

## 2024-06-22 DIAGNOSIS — D509 Iron deficiency anemia, unspecified: Secondary | ICD-10-CM | POA: Diagnosis not present

## 2024-06-22 DIAGNOSIS — Z992 Dependence on renal dialysis: Secondary | ICD-10-CM | POA: Diagnosis not present

## 2024-06-22 DIAGNOSIS — D631 Anemia in chronic kidney disease: Secondary | ICD-10-CM | POA: Diagnosis not present

## 2024-06-22 DIAGNOSIS — D689 Coagulation defect, unspecified: Secondary | ICD-10-CM | POA: Diagnosis not present

## 2024-06-23 ENCOUNTER — Ambulatory Visit

## 2024-06-23 VITALS — Ht 69.0 in | Wt 136.0 lb

## 2024-06-23 DIAGNOSIS — F32A Depression, unspecified: Secondary | ICD-10-CM | POA: Diagnosis not present

## 2024-06-23 DIAGNOSIS — Z0000 Encounter for general adult medical examination without abnormal findings: Secondary | ICD-10-CM

## 2024-06-23 NOTE — Patient Instructions (Signed)
 [name] , Thank you for taking time out of your busy schedule to complete your Annual Wellness Visit with me. I enjoyed our conversation and look forward to speaking with you again next year. I, as well as your care team,  appreciate your ongoing commitment to your health goals. Please review the following plan we discussed and let me know if I can assist you in the future. Your Game plan/ To Do List    Referrals: If you haven't heard from the office you've been referred to, please reach out to them at the phone provided.   Follow up Visits: We will see or speak with you next year for your Next Medicare AWV with our clinical staff Have you seen your provider in the last 6 months (3 months if uncontrolled diabetes)? Yes.  Last office visit on 02/15/2024.  Clinician Recommendations:  Aim for 30 minutes of exercise or brisk walking, 6-8 glasses of water , and 5 servings of fruits and vegetables each day. You are due for a colonoscopy, a A1C check and a Hep C screening.  You can get A1c check and Hep C screening during your next off visit.  Please discuss a colonoscopy with provider during your next office visit.       This is a list of the screenings recommended for you:  Health Maintenance  Topic Date Due   Medicare Annual Wellness Visit  Never done   Hepatitis C Screening  Never done   Colon Cancer Screening  Never done   COVID-19 Vaccine (1 - 2024-25 season) Never done   Hemoglobin A1C  02/23/2024   Flu Shot  06/03/2024   Eye exam for diabetics  06/15/2024   Complete foot exam   02/14/2025   DTaP/Tdap/Td vaccine (2 - Td or Tdap) 01/06/2027   Pneumococcal Vaccine  Completed   Hepatitis B Vaccine  Completed   HIV Screening  Completed   HPV Vaccine  Aged Out   Meningitis B Vaccine  Aged Out    Advanced directives: (Declined) Advance directive discussed with you today. Even though you declined this today, please call our office should you change your mind, and we can give you the proper  paperwork for you to fill out. Advance Care Planning is important because it:  [x]  Makes sure you receive the medical care that is consistent with your values, goals, and preferences  [x]  It provides guidance to your family and loved ones and reduces their decisional burden about whether or not they are making the right decisions based on your wishes.  Follow the link provided in your after visit summary or read over the paperwork we have mailed to you to help you started getting your Advance Directives in place. If you need assistance in completing these, please reach out to us  so that we can help you!  See attachments for Preventive Care and Fall Prevention Tips.

## 2024-06-23 NOTE — Progress Notes (Signed)
 Subjective:   Levi Hodges is a 48 y.o. who presents for a Medicare Wellness preventive visit.  As a reminder, Annual Wellness Visits don't include a physical exam, and some assessments may be limited, especially if this visit is performed virtually. We may recommend an in-person follow-up visit with your provider if needed.  Visit Complete: Virtual I connected with  Levi Hodges on 06/23/24 by a video and audio enabled telemedicine application and verified that I am speaking with the correct person using two identifiers.  Patient Location: Home  Provider Location: Office/Clinic  I discussed the limitations of evaluation and management by telemedicine. The patient expressed understanding and agreed to proceed.  Vital Signs: Because this visit was a virtual/telehealth visit, some criteria may be missing or patient reported. Any vitals not documented were not able to be obtained and vitals that have been documented are patient reported.  Persons Participating in Visit: Patient.  AWV Questionnaire: Yes: Patient Medicare AWV questionnaire was completed by the patient on 06/20/2024; I have confirmed that all information answered by patient is correct and no changes since this date.  Cardiac Risk Factors include: advanced age (>40men, >74 women);hypertension;diabetes mellitus;dyslipidemia;Other (see comment), Risk factor comments: ESRD     Objective:    Today's Vitals   06/20/24 2200 06/23/24 0844  Weight:  136 lb (61.7 kg)  Height:  5' 9 (1.753 m)  PainSc: 6     Body mass index is 20.08 kg/m.     06/23/2024    8:49 AM 08/27/2023    6:36 PM 08/26/2023    1:31 PM 08/25/2023   11:57 AM 08/24/2023    7:35 PM 08/19/2023    9:15 PM 10/30/2022   11:04 AM  Advanced Directives  Does Patient Have a Medical Advance Directive? No No No No No No No  Would patient like information on creating a medical advance directive?   No - Patient declined No - Patient declined  No - Patient  declined No - Patient declined    Current Medications (verified) Outpatient Encounter Medications as of 06/23/2024  Medication Sig   acetaminophen  (TYLENOL ) 500 MG tablet Take 2 tablets (1,000 mg total) by mouth every 6 (six) hours as needed for mild pain (pain score 1-3).   amLODipine  (NORVASC ) 10 MG tablet Take 1 tablet (10 mg total) by mouth daily.   AURYXIA  1 GM 210 MG(Fe) tablet Take 420 mg by mouth 3 (three) times daily.   carvedilol  (COREG ) 25 MG tablet Take 1 tablet (25 mg total) by mouth 2 (two) times daily.   docusate sodium  (COLACE) 100 MG capsule Take 1 capsule (100 mg total) by mouth 2 (two) times daily.   gabapentin  (NEURONTIN ) 300 MG capsule Take 1 capsule (300 mg total) by mouth at bedtime.   glucose blood test strip Use as instructed   Insulin  Pen Needle (PEN NEEDLES) 31G X 5 MM MISC 1 Package by Does not apply route daily.   Lancets (ACCU-CHEK MULTICLIX) lancets Use as instructed up to 4 times a day   nicotine  (NICODERM CQ  - DOSED IN MG/24 HOURS) 21 mg/24hr patch Place 1 patch (21 mg total) onto the skin daily.   pantoprazole  (PROTONIX ) 40 MG tablet TAKE 1 TABLET(40 MG) BY MOUTH DAILY   polyethylene glycol (MIRALAX ) 17 g packet Take 17 g by mouth daily as needed for mild constipation.   sevelamer  carbonate (RENVELA ) 800 MG tablet Take 1 tablet (800 mg total) by mouth 3 (three) times daily with meals.   simvastatin  (ZOCOR )  20 MG tablet TAKE 1 TABLET(20 MG) BY MOUTH AT BEDTIME   No facility-administered encounter medications on file as of 06/23/2024.    Allergies (verified) Patient has no known allergies.   History: Past Medical History:  Diagnosis Date   Anemia    Depression    Diabetes mellitus without complication (HCC)    Type II- no meds   Hypertension    Renal disorder    Past Surgical History:  Procedure Laterality Date   A/V FISTULAGRAM N/A 12/29/2023   Procedure: A/V Fistulagram;  Surgeon: Melia Lynwood ORN, MD;  Location: [facility] INVASIVE CV LAB;  Service:  Cardiovascular;  Laterality: N/A;   AV FISTULA PLACEMENT Left 03/26/2022   Procedure: ARTERIOVENOUS (AV) FISTULA CREATION;  Surgeon: Magda Debby SAILOR, MD;  Location: [facility] OR;  Service: Vascular;  Laterality: Left;   AV FISTULA PLACEMENT Left 05/22/2022   Procedure: FIRSTSTAGE BASILIC ARTERIOVENOUS  FISTULA CREATION;  Surgeon: Magda Debby SAILOR, MD;  Location: [facility] OR;  Service: Vascular;  Laterality: Left;   BASCILIC VEIN TRANSPOSITION Left 08/28/2022   Procedure: LEFT SECOND STAGE BASILIC VEIN TRANSPOSITION;  Surgeon: Magda Debby SAILOR, MD;  Location: [facility] OR;  Service: Vascular;  Laterality: Left;  PERIPHERAL NERVE BLOCK   CHOLECYSTECTOMY N/A 08/26/2023   Procedure: LAPAROSCOPIC CHOLECYSTECTOMY;  Surgeon: Ebbie Cough, MD;  Location: [facility] OR;  Service: General;  Laterality: N/A;   INSERTION OF DIALYSIS CATHETER Right 03/26/2022   Procedure: INSERTION OF DIALYSIS CATHETER USING PALINDROME PRECISOIN CHRONIC CATHETER KIT (19cm);  Surgeon: Magda Debby SAILOR, MD;  Location: [facility] OR;  Service: Vascular;  Laterality: Right;   PERIPHERAL VASCULAR BALLOON ANGIOPLASTY  12/29/2023   Procedure: PERIPHERAL VASCULAR BALLOON ANGIOPLASTY;  Surgeon: Melia Lynwood ORN, MD;  Location: [facility] INVASIVE CV LAB;  Service: Cardiovascular;;  Outflow Basilic Vein   TOOTH EXTRACTION N/A 07/23/2022   Procedure: DENTAL RESTORATION/EXTRACTIONS;  Surgeon: Sheryle Hamilton, DMD;  Location: [facility] OR;  Service: Oral Surgery;  Laterality: N/A;   Family History  Problem Relation Age of Onset   High blood pressure Mother    Diabetes Maternal Grandfather    Social History   Socioeconomic History   Marital status: Married    Spouse name: Cherly   Number of children: Not on file   Years of education: Not on file   Highest education level: 10th grade  Occupational History   Not on file  Tobacco Use   Smoking status: Every Day    Current packs/day: 2.00    Average packs/day: 2.0 packs/day for 30.0 years (60.0 ttl pk-yrs)    Types: Cigarettes     Passive exposure: Current   Smokeless tobacco: Never   Tobacco comments:    2 packs a day  Vaping Use   Vaping status: Never Used  Substance and Sexual Activity   Alcohol use: No   Drug use: No   Sexual activity: Yes  Other Topics Concern   Not on file  Social History Narrative   Are you right handed or left handed? right   Are you currently employed ?    What is your current occupation?retired   Do you live at home alone?wife   Who lives with you?    What type of home do you live in: 1 story or 2 story? one       Social Drivers of Corporate investment banker Strain: Low Risk  (06/20/2024)   Overall Financial Resource Strain (CARDIA)    Difficulty of Paying Living Expenses: Not very hard  Food Insecurity:  Food Insecurity Present (06/20/2024)   Hunger Vital Sign    Worried About Running Out of Food in the Last Year: Often true    Ran Out of Food in the Last Year: Often true  Transportation Needs: No Transportation Needs (06/20/2024)   PRAPARE - Administrator, Civil Service (Medical): No    Lack of Transportation (Non-Medical): No  Physical Activity: Inactive (06/20/2024)   Exercise Vital Sign    Days of Exercise per Week: 0 days    Minutes of Exercise per Session: Not on file  Stress: Stress Concern Present (06/20/2024)   [employer] of Occupational Health - Occupational Stress Questionnaire    Feeling of Stress: Very much  Social Connections: Socially Isolated (06/20/2024)   Social Connection and Isolation Panel    Frequency of Communication with Friends and Family: Never    Frequency of Social Gatherings with Friends and Family: Never    Attends Religious Services: Never    Database administrator or Organizations: No    Attends Engineer, structural: Not on file    Marital Status: Married    Tobacco Counseling Ready to quit: Not Answered Counseling given: Not Answered Tobacco comments: 2 packs a day    Clinical Intake:     Pain :  0-10 Pain Score: 6  Pain Type: Chronic pain Pain Descriptors / Indicators: Aching, Discomfort Pain Onset: More than a month ago Pain Frequency: Intermittent     BMI - recorded: 20.08 Nutritional Risks: Nausea/ vomitting/ diarrhea Diabetes: Yes CBG done?: No Did pt. bring in CBG monitor from home?: No  Lab Results  Component Value Date   HGBA1C 5.4 08/25/2023   HGBA1C 5.4 03/24/2022   HGBA1C 6.1 02/27/2021     How often do you need to have someone help you when you read instructions, pamphlets, or other written materials from your doctor or pharmacy?: 1 - Never  Interpreter Needed?: No  Information entered by :: Katerine Morua, RMA   Activities of Daily Living     06/20/2024   10:00 PM 12/29/2023    9:51 AM  In your present state of health, do you have any difficulty performing the following activities:  Hearing? 0 0  Vision? 1 0  Difficulty concentrating or making decisions? 1 0  Walking or climbing stairs? 1   Dressing or bathing? 0   Doing errands, shopping? 1   Preparing Food and eating ? N   Using the Toilet? Y   In the past six months, have you accidently leaked urine? Y   Do you have problems with loss of bowel control? Y   Managing your Medications? N   Managing your Finances? N   Housekeeping or managing your Housekeeping? N     Patient Care Team: Rollene Almarie LABOR, MD as PCP - General (Internal Medicine) Center, [facility]  I have updated your Care Teams any recent Medical Services you may have received from other providers in the past year.     Assessment:   This is a routine wellness examination for Jt.  Hearing/Vision screen Hearing Screening - Comments:: Denies hearing difficulties   Vision Screening - Comments:: Wears eyeglasses/Dr. Debarah    Goals Addressed               This Visit's Progress     Patient Stated (pt-stated)        Better care       Depression Screen     06/23/2024  8:49 AM 02/15/2024     9:44 AM 04/03/2022    9:37 AM 09/21/2019    8:45 AM  PHQ 2/9 Scores  PHQ - 2 Score 3 2 6  0  PHQ- 9 Score 13 9 24      Fall Risk     06/20/2024   10:00 PM 02/15/2024    9:44 AM 10/09/2022    8:03 AM 04/03/2022    9:38 AM 04/03/2022    9:37 AM  Fall Risk   Falls in the past year? 1 1 1  0 0  Number falls in past yr: 0 1 1 0 0  Injury with Fall? 0 0 0 0   Follow up Falls evaluation completed;Falls prevention discussed Falls evaluation completed Falls evaluation completed        Data saved with a previous flowsheet row definition    MEDICARE RISK AT HOME:  Medicare Risk at Home Any stairs in or around the home?: (Patient-Rptd) No If so, are there any without handrails?: (Patient-Rptd) No Home free of loose throw rugs in walkways, pet beds, electrical cords, etc?: (Patient-Rptd) Yes Adequate lighting in your home to reduce risk of falls?: (Patient-Rptd) Yes Life alert?: (Patient-Rptd) No Use of a cane, walker or w/c?: (Patient-Rptd) No Grab bars in the bathroom?: (Patient-Rptd) Yes Shower chair or bench in shower?: (Patient-Rptd) Yes Elevated toilet seat or a handicapped toilet?: (Patient-Rptd) No  TIMED UP AND GO:  Was the test performed?  No  Cognitive Function: Declined/Normal: No cognitive concerns noted by patient or family. Patient alert, oriented, able to answer questions appropriately and recall recent events. No signs of memory loss or confusion.        Immunizations Immunization History  Administered Date(s) Administered   Hepb-cpg 04/23/2022, 05/21/2022, 07/02/2022   PNEUMOCOCCAL CONJUGATE-20 04/14/2022   Pneumococcal Polysaccharide-23 06/16/2022   Tdap 01/05/2017    Screening Tests Health Maintenance  Topic Date Due   Medicare Annual Wellness (AWV)  Never done   Hepatitis C Screening  Never done   Colonoscopy  Never done   COVID-19 Vaccine (1 - 2024-25 season) Never done   HEMOGLOBIN A1C  02/23/2024   INFLUENZA VACCINE  06/03/2024   OPHTHALMOLOGY EXAM   06/15/2024   FOOT EXAM  02/14/2025   DTaP/Tdap/Td (2 - Td or Tdap) 01/06/2027   Pneumococcal Vaccine  Completed   Hepatitis B Vaccines 19-59 Average Risk  Completed   HIV Screening  Completed   HPV VACCINES  Aged Out   Meningococcal B Vaccine  Aged Out    Health Maintenance  Health Maintenance Due  Topic Date Due   Medicare Annual Wellness (AWV)  Never done   Hepatitis C Screening  Never done   Colonoscopy  Never done   COVID-19 Vaccine (1 - 2024-25 season) Never done   HEMOGLOBIN A1C  02/23/2024   INFLUENZA VACCINE  06/03/2024   OPHTHALMOLOGY EXAM  06/15/2024   Health Maintenance Items Addressed: See Nurse Notes at the end of this note  Additional Screening:  Vision Screening: Recommended annual ophthalmology exams for early detection of glaucoma and other disorders of the eye. Would you like a referral to an eye doctor? No    Dental Screening: Recommended annual dental exams for proper oral hygiene  Community Resource Referral / Chronic Care Management: CRR required this visit?  Yes   CCM required this visit?  No   Plan:    I have personally reviewed and noted the following in the patient's chart:   Medical and social history Use  of alcohol, tobacco or illicit drugs  Current medications and supplements including opioid prescriptions. Patient is not currently taking opioid prescriptions. Functional ability and status Nutritional status Physical activity Advanced directives List of other physicians Hospitalizations, surgeries, and ER visits in previous 12 months Vitals Screenings to include cognitive, depression, and falls Referrals and appointments  In addition, I have reviewed and discussed with patient certain preventive protocols, quality metrics, and best practice recommendations. A written personalized care plan for preventive services as well as general preventive health recommendations were provided to patient.   Jaena Brocato L Jacobi Nile, CMA   06/23/2024    After Visit Summary: (MyChart) Due to this being a telephonic visit, the after visit summary with patients personalized plan was offered to patient via MyChart   Notes: Patient is due for a colonoscopy, however, he would like to discuss with provider during his next office visit.  He is due for a A1C check and a Hep C screening, which he can get during his next office visit.  Patient stated that he has an eye doctor appointment coming up in September.  I have placed a community resource referral due to patient's PHQ9 score, as the score due to patient's condition.  It seems like he is having a hard time with dx.

## 2024-06-24 DIAGNOSIS — E1122 Type 2 diabetes mellitus with diabetic chronic kidney disease: Secondary | ICD-10-CM | POA: Diagnosis not present

## 2024-06-24 DIAGNOSIS — N2581 Secondary hyperparathyroidism of renal origin: Secondary | ICD-10-CM | POA: Diagnosis not present

## 2024-06-24 DIAGNOSIS — D631 Anemia in chronic kidney disease: Secondary | ICD-10-CM | POA: Diagnosis not present

## 2024-06-24 DIAGNOSIS — D509 Iron deficiency anemia, unspecified: Secondary | ICD-10-CM | POA: Diagnosis not present

## 2024-06-24 DIAGNOSIS — N186 End stage renal disease: Secondary | ICD-10-CM | POA: Diagnosis not present

## 2024-06-24 DIAGNOSIS — Z992 Dependence on renal dialysis: Secondary | ICD-10-CM | POA: Diagnosis not present

## 2024-07-01 DIAGNOSIS — N186 End stage renal disease: Secondary | ICD-10-CM | POA: Diagnosis not present

## 2024-07-01 DIAGNOSIS — D631 Anemia in chronic kidney disease: Secondary | ICD-10-CM | POA: Diagnosis not present

## 2024-07-01 DIAGNOSIS — N2581 Secondary hyperparathyroidism of renal origin: Secondary | ICD-10-CM | POA: Diagnosis not present

## 2024-07-01 DIAGNOSIS — D689 Coagulation defect, unspecified: Secondary | ICD-10-CM | POA: Diagnosis not present

## 2024-07-01 DIAGNOSIS — E1122 Type 2 diabetes mellitus with diabetic chronic kidney disease: Secondary | ICD-10-CM | POA: Diagnosis not present

## 2024-07-01 DIAGNOSIS — Z992 Dependence on renal dialysis: Secondary | ICD-10-CM | POA: Diagnosis not present

## 2024-07-01 DIAGNOSIS — D509 Iron deficiency anemia, unspecified: Secondary | ICD-10-CM | POA: Diagnosis not present

## 2024-07-03 DIAGNOSIS — N186 End stage renal disease: Secondary | ICD-10-CM | POA: Diagnosis not present

## 2024-07-03 DIAGNOSIS — Z992 Dependence on renal dialysis: Secondary | ICD-10-CM | POA: Diagnosis not present

## 2024-07-03 DIAGNOSIS — E1122 Type 2 diabetes mellitus with diabetic chronic kidney disease: Secondary | ICD-10-CM | POA: Diagnosis not present

## 2024-07-04 DIAGNOSIS — N186 End stage renal disease: Secondary | ICD-10-CM | POA: Diagnosis not present

## 2024-07-04 DIAGNOSIS — D689 Coagulation defect, unspecified: Secondary | ICD-10-CM | POA: Diagnosis not present

## 2024-07-04 DIAGNOSIS — N2581 Secondary hyperparathyroidism of renal origin: Secondary | ICD-10-CM | POA: Diagnosis not present

## 2024-07-04 DIAGNOSIS — D631 Anemia in chronic kidney disease: Secondary | ICD-10-CM | POA: Diagnosis not present

## 2024-07-04 DIAGNOSIS — D509 Iron deficiency anemia, unspecified: Secondary | ICD-10-CM | POA: Diagnosis not present

## 2024-07-04 DIAGNOSIS — Z992 Dependence on renal dialysis: Secondary | ICD-10-CM | POA: Diagnosis not present

## 2024-07-04 DIAGNOSIS — E1122 Type 2 diabetes mellitus with diabetic chronic kidney disease: Secondary | ICD-10-CM | POA: Diagnosis not present

## 2024-07-06 DIAGNOSIS — N186 End stage renal disease: Secondary | ICD-10-CM | POA: Diagnosis not present

## 2024-07-06 DIAGNOSIS — D509 Iron deficiency anemia, unspecified: Secondary | ICD-10-CM | POA: Diagnosis not present

## 2024-07-06 DIAGNOSIS — D631 Anemia in chronic kidney disease: Secondary | ICD-10-CM | POA: Diagnosis not present

## 2024-07-06 DIAGNOSIS — N2581 Secondary hyperparathyroidism of renal origin: Secondary | ICD-10-CM | POA: Diagnosis not present

## 2024-07-06 DIAGNOSIS — D689 Coagulation defect, unspecified: Secondary | ICD-10-CM | POA: Diagnosis not present

## 2024-07-06 DIAGNOSIS — Z992 Dependence on renal dialysis: Secondary | ICD-10-CM | POA: Diagnosis not present

## 2024-07-06 DIAGNOSIS — E1122 Type 2 diabetes mellitus with diabetic chronic kidney disease: Secondary | ICD-10-CM | POA: Diagnosis not present

## 2024-07-07 ENCOUNTER — Telehealth: Payer: Self-pay | Admitting: *Deleted

## 2024-07-07 NOTE — Progress Notes (Signed)
 Complex Care Management Note Care Guide Note  07/07/2024 Name: Levi Hodges MRN: [id number] DOB: 20-Sep-1976   Complex Care Management Outreach Attempts: An unsuccessful telephone outreach was attempted today to offer the patient information about available complex care management services.  Follow Up Plan:  Additional outreach attempts will be made to offer the patient complex care management information and services.   Encounter Outcome:  No Answer  Thedford Franks, CMA [place]  [facility], [facility] Guide Direct Dial : [phone]  Fax: [phone] Website: [email or web address]

## 2024-07-08 DIAGNOSIS — D631 Anemia in chronic kidney disease: Secondary | ICD-10-CM | POA: Diagnosis not present

## 2024-07-11 DIAGNOSIS — D689 Coagulation defect, unspecified: Secondary | ICD-10-CM | POA: Diagnosis not present

## 2024-07-11 DIAGNOSIS — Z992 Dependence on renal dialysis: Secondary | ICD-10-CM | POA: Diagnosis not present

## 2024-07-11 DIAGNOSIS — D509 Iron deficiency anemia, unspecified: Secondary | ICD-10-CM | POA: Diagnosis not present

## 2024-07-11 DIAGNOSIS — D631 Anemia in chronic kidney disease: Secondary | ICD-10-CM | POA: Diagnosis not present

## 2024-07-11 DIAGNOSIS — N186 End stage renal disease: Secondary | ICD-10-CM | POA: Diagnosis not present

## 2024-07-11 DIAGNOSIS — E1122 Type 2 diabetes mellitus with diabetic chronic kidney disease: Secondary | ICD-10-CM | POA: Diagnosis not present

## 2024-07-11 NOTE — Progress Notes (Signed)
 Complex Care Management Note  Care Guide Note 07/11/2024 Name: Eri Platten MRN: [id number] DOB: September 18, 1976  Jameison Haji is a 48 y.o. year old male who sees Rollene Almarie LABOR, MD for primary care. I reached out to [employer] by phone today to offer complex care management services.  Mr. Tavella was given information about Complex Care Management services today including:   The Complex Care Management services include support from the care team which includes your Nurse Care Manager, Clinical Social Worker, or Pharmacist.  The Complex Care Management team is here to help remove barriers to the health concerns and goals most important to you. Complex Care Management services are voluntary, and the patient may decline or stop services at any time by request to their care team member.   Complex Care Management Consent Status: Patient did not agree to participate in complex care management services at this time.  Encounter Outcome:  Patient Refused  Thedford Franks, CMA [place]  [facility], [facility] Guide Direct Dial : [phone]  Fax: [phone] Website: [email or web address]

## 2024-07-15 DIAGNOSIS — D689 Coagulation defect, unspecified: Secondary | ICD-10-CM | POA: Diagnosis not present

## 2024-07-15 DIAGNOSIS — N186 End stage renal disease: Secondary | ICD-10-CM | POA: Diagnosis not present

## 2024-07-15 DIAGNOSIS — Z992 Dependence on renal dialysis: Secondary | ICD-10-CM | POA: Diagnosis not present

## 2024-07-15 DIAGNOSIS — D631 Anemia in chronic kidney disease: Secondary | ICD-10-CM | POA: Diagnosis not present

## 2024-07-15 DIAGNOSIS — E1122 Type 2 diabetes mellitus with diabetic chronic kidney disease: Secondary | ICD-10-CM | POA: Diagnosis not present

## 2024-07-15 DIAGNOSIS — N2581 Secondary hyperparathyroidism of renal origin: Secondary | ICD-10-CM | POA: Diagnosis not present

## 2024-07-18 ENCOUNTER — Encounter (HOSPITAL_COMMUNITY): Payer: Self-pay | Admitting: Vascular Surgery

## 2024-07-18 ENCOUNTER — Ambulatory Visit (HOSPITAL_COMMUNITY)
Admission: RE | Admit: 2024-07-18 | Discharge: 2024-07-18 | Disposition: A | Discharge status: Home / Self Care | Attending: Vascular Surgery | Admitting: Vascular Surgery

## 2024-07-18 ENCOUNTER — Other Ambulatory Visit: Payer: Self-pay

## 2024-07-18 ENCOUNTER — Encounter (HOSPITAL_COMMUNITY)
Admission: RE | Disposition: A | Discharge status: Home / Self Care | Payer: Self-pay | Source: Home / Self Care | Attending: Vascular Surgery

## 2024-07-18 DIAGNOSIS — Z992 Dependence on renal dialysis: Secondary | ICD-10-CM | POA: Diagnosis not present

## 2024-07-18 DIAGNOSIS — Z604 Social exclusion and rejection: Secondary | ICD-10-CM | POA: Insufficient documentation

## 2024-07-18 DIAGNOSIS — E1122 Type 2 diabetes mellitus with diabetic chronic kidney disease: Secondary | ICD-10-CM | POA: Diagnosis not present

## 2024-07-18 DIAGNOSIS — F17210 Nicotine dependence, cigarettes, uncomplicated: Secondary | ICD-10-CM | POA: Insufficient documentation

## 2024-07-18 DIAGNOSIS — T82898A Other specified complication of vascular prosthetic devices, implants and grafts, initial encounter: Secondary | ICD-10-CM | POA: Diagnosis not present

## 2024-07-18 DIAGNOSIS — I120 Hypertensive chronic kidney disease with stage 5 chronic kidney disease or end stage renal disease: Secondary | ICD-10-CM | POA: Insufficient documentation

## 2024-07-18 DIAGNOSIS — N186 End stage renal disease: Secondary | ICD-10-CM | POA: Insufficient documentation

## 2024-07-18 DIAGNOSIS — I871 Compression of vein: Secondary | ICD-10-CM | POA: Diagnosis not present

## 2024-07-18 DIAGNOSIS — T82510A Breakdown (mechanical) of surgically created arteriovenous fistula, initial encounter: Secondary | ICD-10-CM | POA: Diagnosis not present

## 2024-07-18 DIAGNOSIS — Z555 Less than a high school diploma: Secondary | ICD-10-CM | POA: Diagnosis not present

## 2024-07-18 DIAGNOSIS — Y832 Surgical operation with anastomosis, bypass or graft as the cause of abnormal reaction of the patient, or of later complication, without mention of misadventure at the time of the procedure: Secondary | ICD-10-CM | POA: Insufficient documentation

## 2024-07-18 DIAGNOSIS — T82858A Stenosis of vascular prosthetic devices, implants and grafts, initial encounter: Secondary | ICD-10-CM | POA: Diagnosis not present

## 2024-07-18 HISTORY — PX: A/V FISTULAGRAM: CATH118298

## 2024-07-18 HISTORY — PX: A/V SHUNT INTERVENTION: CATH118220

## 2024-07-18 SURGERY — A/V SHUNT INTERVENTION
Anesthesia: LOCAL | Site: Arm Upper

## 2024-07-18 MED ORDER — IODIXANOL 320 MG/ML IV SOLN
INTRAVENOUS | Status: DC | PRN
  Administered 2024-07-18: 45 mL via INTRA_ARTERIAL

## 2024-07-18 MED ORDER — LIDOCAINE HCL (PF) 1 % IJ SOLN
INTRAMUSCULAR | Status: DC | PRN
  Administered 2024-07-18: 5 mL

## 2024-07-18 MED ORDER — LIDOCAINE HCL (PF) 1 % IJ SOLN
INTRAMUSCULAR | Status: AC
  Filled 2024-07-18: qty 30

## 2024-07-18 MED ORDER — HEPARIN (PORCINE) IN NACL 1000-0.9 UT/500ML-% IV SOLN
INTRAVENOUS | Status: DC | PRN
  Administered 2024-07-18: 500 mL via SURGICAL_CAVITY

## 2024-07-18 SURGICAL SUPPLY — 12 items
BALLOON ATHLETIS 10X40X75 (BALLOONS) IMPLANT
BALLOON MUSTANG 5.0X40 75 (BALLOONS) IMPLANT
BALLOON MUSTANG 6X80X75 (BALLOONS) IMPLANT
GUIDEWIRE ANGLED .035 180CM (WIRE) IMPLANT
KIT ENCORE 26 ADVANTAGE (KITS) IMPLANT
KIT MICROPUNCTURE NIT STIFF (SHEATH) IMPLANT
KIT PV (KITS) ×2 IMPLANT
SHEATH PINNACLE R/O II 6F 4CM (SHEATH) IMPLANT
SHEATH PINNACLE R/O II 7F 4CM (SHEATH) IMPLANT
SHEATH PROBE COVER 6X72 (BAG) IMPLANT
TRAY PV CATH (CUSTOM PROCEDURE TRAY) ×2 IMPLANT
TUBING CIL FLEX 10 FLL-RA (TUBING) IMPLANT

## 2024-07-18 NOTE — Op Note (Signed)
 DATE OF SERVICE: 07/18/2024  PATIENT:  Levi Hodges  48 y.o. male  PRE-OPERATIVE DIAGNOSIS:  end-stage renal disease; low flows through left arm AVF  POST-OPERATIVE DIAGNOSIS:  Same  PROCEDURE:   1) Ultrasound guided left arm AV fistula access (CPT [phone]) 2) fistulagram with peripheral angioplasty (CPT [phone]) - 6x48mm mustang 3) fistulagram with central angioplasty (CPT +63092) - 10x40 Athletis 4) established outpatient evaluation and management - level 3 (CPT 99213)  SURGEON:  Debby SAILOR. Magda, MD  ASSISTANT: none  ANESTHESIA:   local and IV sedation  ESTIMATED BLOOD LOSS: min  LOCAL MEDICATIONS USED:  LIDOCAINE    COUNTS: confirmed correct.  PATIENT DISPOSITION:  PACU - hemodynamically stable.   Delay start of Pharmacological VTE agent (>24hrs) due to surgical blood loss or risk of bleeding: no  INDICATION FOR PROCEDURE: Levi Hodges is a 48 y.o. male with ESRD on HD via left arm AV brachiobasilic AV fistula. The fistula flow rates are declining per his dialysis center. After careful discussion of risks, benefits, and alternatives the patient was offered fistulagram. The patient understood and wished to proceed.  OPERATIVE FINDINGS:  Left Upper Extremity Central venous: 70% stenosis at confluence of subclavian vein and innominate vein under the clavicular head. No other stenosis Subclavian vein: no other stenosis Axillary vein: no stenosis Fistula: inflow / peripheral fistula stenosis through a long segment from anastomosis to mid fistula. No other stenosis Anastomosis: no stenosis in anastomosis  DESCRIPTION OF PROCEDURE: After identification of the patient in the pre-operative holding area, the patient was transferred to the operating room. The patient was positioned supine on the operating room table.  The left upper extremity was prepped and draped in standard fashion. A surgical pause was performed confirming correct patient, procedure, and operative  location.  The left upper extremity was anesthetized with subcutaneous injection of 1% lidocaine  over the area of planned access. Using ultrasound guidance, the left arm dialysis access was accessed with micropuncture technique.  Fistulogram was performed in stations with the micro sheath.  See above for details.  The decision was made to intervene.  The wire was navigated retrograde across the anastomosis.  Angioplasty was performed of the peripheral fistula from the anastomosis back to an area of aneurysmal degeneration with a 6 x 80 mm Mustang balloon.  Good result was achieved.  Improved thrill was felt.  The wire was redirected antegrade and the sheath repositioned to treat the central stenosis.  The lesion was crossed with a wire.  Angioplasty was performed with a 10 x 40 mm Athletis balloon.  Good result was noted.    All endovascular equipment was removed.  A figure-of-eight stitch was applied to the exit site with good hemostasis.  Sterile bandage was applied.  Upon completion of the case instrument and sharps counts were confirmed correct. The patient was transferred to the PACU in good condition. I was present for all portions of the procedure.  PLAN: Fistula remains amenable to percutaneous intervention.  Okay to use fistula for dialysis.  Follow-up with me as needed  Debby SAILOR. Magda, MD [facility] Phone Number: [phone] 07/18/2024 8:31 AM

## 2024-07-18 NOTE — H&P (Signed)
 [facility] [place]  ASSESSMENT / PLAN: 48 y.o. male with ESRD on HD via LUE AVF. The fistula flow rates are decreasing. The patient is referred for fistulagram to evaluate and manage. Will plan to do this today in cath lab.  CHIEF COMPLAINT: ESRD  HISTORY OF PRESENT ILLNESS: Levi Hodges is a 48 y.o. male with ESRD on HD via LUE BB AVF. The flow rates of the fistula are decreasing. He is referred for fistulagram. He has no other complaints today.   Past Medical History:  Diagnosis Date   Anemia    Depression    Diabetes mellitus without complication (HCC)    Type II- no meds   Hypertension    Renal disorder     Past Surgical History:  Procedure Laterality Date   A/V FISTULAGRAM N/A 12/29/2023   Procedure: A/V Fistulagram;  Surgeon: Melia Lynwood ORN, MD;  Location: [facility] INVASIVE CV LAB;  Service: Cardiovascular;  Laterality: N/A;   AV FISTULA PLACEMENT Left 03/26/2022   Procedure: ARTERIOVENOUS (AV) FISTULA CREATION;  Surgeon: Magda Debby SAILOR, MD;  Location: [facility] OR;  Service: Vascular;  Laterality: Left;   AV FISTULA PLACEMENT Left 05/22/2022   Procedure: FIRSTSTAGE BASILIC ARTERIOVENOUS  FISTULA CREATION;  Surgeon: Magda Debby SAILOR, MD;  Location: [facility] OR;  Service: Vascular;  Laterality: Left;   BASCILIC VEIN TRANSPOSITION Left 08/28/2022   Procedure: LEFT SECOND STAGE BASILIC VEIN TRANSPOSITION;  Surgeon: Magda Debby SAILOR, MD;  Location: [facility] OR;  Service: Vascular;  Laterality: Left;  PERIPHERAL NERVE BLOCK   CHOLECYSTECTOMY N/A 08/26/2023   Procedure: LAPAROSCOPIC CHOLECYSTECTOMY;  Surgeon: Ebbie Cough, MD;  Location: [facility] OR;  Service: General;  Laterality: N/A;   INSERTION OF DIALYSIS CATHETER Right 03/26/2022   Procedure: INSERTION OF DIALYSIS CATHETER USING PALINDROME PRECISOIN CHRONIC CATHETER KIT (19cm);  Surgeon: Magda Debby SAILOR, MD;  Location: [facility] OR;  Service: Vascular;  Laterality: Right;   PERIPHERAL VASCULAR BALLOON ANGIOPLASTY  12/29/2023    Procedure: PERIPHERAL VASCULAR BALLOON ANGIOPLASTY;  Surgeon: Melia Lynwood ORN, MD;  Location: [facility] INVASIVE CV LAB;  Service: Cardiovascular;;  Outflow Basilic Vein   TOOTH EXTRACTION N/A 07/23/2022   Procedure: DENTAL RESTORATION/EXTRACTIONS;  Surgeon: Sheryle Hamilton, DMD;  Location: [facility] OR;  Service: Oral Surgery;  Laterality: N/A;    Family History  Problem Relation Age of Onset   High blood pressure Mother    Diabetes Maternal Grandfather     Social History   Socioeconomic History   Marital status: Married    Spouse name: Cherly   Number of children: Not on file   Years of education: Not on file   Highest education level: 10th grade  Occupational History   Not on file  Tobacco Use   Smoking status: Every Day    Current packs/day: 2.00    Average packs/day: 2.0 packs/day for 30.0 years (60.0 ttl pk-yrs)    Types: Cigarettes    Passive exposure: Current   Smokeless tobacco: Never   Tobacco comments:    2 packs a day  Vaping Use   Vaping status: Never Used  Substance and Sexual Activity   Alcohol use: No   Drug use: No   Sexual activity: Yes  Other Topics Concern   Not on file  Social History Narrative   Are you right handed or left handed? right   Are you currently employed ?    What is your current occupation?retired   Do you live at home alone?wife   Who lives with you?  What type of home do you live in: 1 story or 2 story? one       Social Drivers of Corporate investment banker Strain: Low Risk  (06/20/2024)   Overall Financial Resource Strain (CARDIA)    Difficulty of Paying Living Expenses: Not very hard  Food Insecurity: Food Insecurity Present (06/20/2024)   Hunger Vital Sign    Worried About Running Out of Food in the Last Year: Often true    Ran Out of Food in the Last Year: Often true  Transportation Needs: No Transportation Needs (06/20/2024)   PRAPARE - Administrator, Civil Service (Medical): No    Lack of Transportation (Non-Medical): No   Physical Activity: Inactive (06/20/2024)   Exercise Vital Sign    Days of Exercise per Week: 0 days    Minutes of Exercise per Session: Not on file  Stress: Stress Concern Present (06/20/2024)   [employer] of Occupational Health - Occupational Stress Questionnaire    Feeling of Stress: Very much  Social Connections: Socially Isolated (06/20/2024)   Social Connection and Isolation Panel    Frequency of Communication with Friends and Family: Never    Frequency of Social Gatherings with Friends and Family: Never    Attends Religious Services: Never    Database administrator or Organizations: No    Attends Engineer, structural: Not on file    Marital Status: Married  Catering manager Violence: Not At Risk (06/23/2024)   Humiliation, Afraid, Rape, and Kick questionnaire    Fear of Current or Ex-Partner: No    Emotionally Abused: No    Physically Abused: No    Sexually Abused: No    No Known Allergies  No current facility-administered medications for this encounter.    PHYSICAL EXAM Vitals:   07/18/24 0712  BP: (!) 160/86  Pulse: 65  Resp: 16  SpO2: 100%   Middle aged man in no distress Regular rate and rhythm Unlabored breathing Thrill in LUE BB AVF  PERTINENT LABORATORY AND RADIOLOGIC DATA  Most recent CBC    Latest Ref Rng & Units 08/26/2023    1:31 PM 08/26/2023    4:24 AM 08/24/2023    7:47 PM  CBC  WBC 4.0 - 10.5 K/uL  5.2  5.4   Hemoglobin 13.0 - 17.0 g/dL 84.9  86.6  86.2   Hematocrit 39.0 - 52.0 % 44.0  38.9  40.7   Platelets 150 - 400 K/uL  295  305      Most recent CMP    Latest Ref Rng & Units 08/27/2023    8:48 AM 08/26/2023    1:31 PM 08/26/2023    4:24 AM  CMP  Glucose 70 - 99 mg/dL 85  56  60   BUN 6 - 20 mg/dL 34  22  37   Creatinine 0.61 - 1.24 mg/dL 89.63  2.39  87.23   Sodium 135 - 145 mmol/L 136  136  135   Potassium 3.5 - 5.1 mmol/L 3.8  3.4  3.9   Chloride 98 - 111 mmol/L 89  92  91   CO2 22 - 32 mmol/L 30   27    Calcium 8.9 - 10.3 mg/dL 9.2   9.2     Renal function CrCl cannot be calculated (Patient's most recent lab result is older than the maximum 21 days allowed.).  Hgb A1c MFr Bld (%)  Date Value  08/25/2023 5.4    LDL Cholesterol  Date  Value Ref Range Status  08/27/2018 102 (H) 0 - 99 mg/dL Final   Direct LDL  Date Value Ref Range Status  01/05/2017 115.0 mg/dL Final    Comment:    Optimal:  <100 mg/dLNear or Above Optimal:  100-129 mg/dLBorderline High:  130-159 mg/dLHigh:  160-189 mg/dLVery High:  >190 mg/dL    Debby SAILOR. Magda, MD FACS [facility] Phone Number: [phone] 07/18/2024 7:43 AM   Total time spent on preparing this encounter including chart review, data review, collecting history, examining the patient, and coordinating care: 30 min  Portions of this report may have been transcribed using voice recognition software.  Every effort has been made to ensure accuracy; however, inadvertent computerized transcription errors may still be present.

## 2024-07-20 DIAGNOSIS — N186 End stage renal disease: Secondary | ICD-10-CM | POA: Diagnosis not present

## 2024-07-20 DIAGNOSIS — D509 Iron deficiency anemia, unspecified: Secondary | ICD-10-CM | POA: Diagnosis not present

## 2024-07-20 DIAGNOSIS — N2581 Secondary hyperparathyroidism of renal origin: Secondary | ICD-10-CM | POA: Diagnosis not present

## 2024-07-20 DIAGNOSIS — D689 Coagulation defect, unspecified: Secondary | ICD-10-CM | POA: Diagnosis not present

## 2024-07-20 DIAGNOSIS — Z992 Dependence on renal dialysis: Secondary | ICD-10-CM | POA: Diagnosis not present

## 2024-07-20 DIAGNOSIS — E1122 Type 2 diabetes mellitus with diabetic chronic kidney disease: Secondary | ICD-10-CM | POA: Diagnosis not present

## 2024-07-20 DIAGNOSIS — D631 Anemia in chronic kidney disease: Secondary | ICD-10-CM | POA: Diagnosis not present

## 2024-07-25 DIAGNOSIS — D631 Anemia in chronic kidney disease: Secondary | ICD-10-CM | POA: Diagnosis not present

## 2024-07-25 DIAGNOSIS — D509 Iron deficiency anemia, unspecified: Secondary | ICD-10-CM | POA: Diagnosis not present

## 2024-07-25 DIAGNOSIS — N2581 Secondary hyperparathyroidism of renal origin: Secondary | ICD-10-CM | POA: Diagnosis not present

## 2024-07-25 DIAGNOSIS — D689 Coagulation defect, unspecified: Secondary | ICD-10-CM | POA: Diagnosis not present

## 2024-07-25 DIAGNOSIS — Z992 Dependence on renal dialysis: Secondary | ICD-10-CM | POA: Diagnosis not present

## 2024-07-25 DIAGNOSIS — N186 End stage renal disease: Secondary | ICD-10-CM | POA: Diagnosis not present

## 2024-07-25 DIAGNOSIS — E1122 Type 2 diabetes mellitus with diabetic chronic kidney disease: Secondary | ICD-10-CM | POA: Diagnosis not present

## 2024-07-29 ENCOUNTER — Encounter (HOSPITAL_COMMUNITY): Payer: Self-pay | Admitting: Vascular Surgery

## 2024-07-29 DIAGNOSIS — D689 Coagulation defect, unspecified: Secondary | ICD-10-CM | POA: Diagnosis not present

## 2024-07-29 DIAGNOSIS — Z992 Dependence on renal dialysis: Secondary | ICD-10-CM | POA: Diagnosis not present

## 2024-07-29 DIAGNOSIS — D509 Iron deficiency anemia, unspecified: Secondary | ICD-10-CM | POA: Diagnosis not present

## 2024-07-29 DIAGNOSIS — N2581 Secondary hyperparathyroidism of renal origin: Secondary | ICD-10-CM | POA: Diagnosis not present

## 2024-07-29 DIAGNOSIS — E1122 Type 2 diabetes mellitus with diabetic chronic kidney disease: Secondary | ICD-10-CM | POA: Diagnosis not present

## 2024-07-29 DIAGNOSIS — D631 Anemia in chronic kidney disease: Secondary | ICD-10-CM | POA: Diagnosis not present

## 2024-07-29 DIAGNOSIS — N186 End stage renal disease: Secondary | ICD-10-CM | POA: Diagnosis not present

## 2024-07-31 ENCOUNTER — Ambulatory Visit: Admission: EM | Admit: 2024-07-31 | Discharge: 2024-07-31 | Disposition: A | Discharge status: Home / Self Care

## 2024-07-31 DIAGNOSIS — T63481A Toxic effect of venom of other arthropod, accidental (unintentional), initial encounter: Secondary | ICD-10-CM

## 2024-07-31 MED ORDER — CETIRIZINE HCL 10 MG PO TABS: 10.0000 mg | ORAL_TABLET | Freq: Every day | ORAL | 0 refills | Status: AC

## 2024-07-31 NOTE — Discharge Instructions (Signed)
  1. Insect sting allergy, current reaction, accidental or unintentional, initial encounter (Primary) - cetirizine (ZYRTEC) 10 MG tablet; Take 1 tablet (10 mg total) by mouth daily.  Dispense: 30 tablet; Refill: 0 -Continue to monitor symptoms for any change in severity if there is any escalation of current symptoms or development of new symptoms follow-up in ER for further evaluation and management.
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
## 2024-08-01 DIAGNOSIS — E1122 Type 2 diabetes mellitus with diabetic chronic kidney disease: Secondary | ICD-10-CM | POA: Diagnosis not present

## 2024-08-02 DIAGNOSIS — N186 End stage renal disease: Secondary | ICD-10-CM | POA: Diagnosis not present

## 2024-08-02 DIAGNOSIS — Z992 Dependence on renal dialysis: Secondary | ICD-10-CM | POA: Diagnosis not present

## 2024-08-02 DIAGNOSIS — E1122 Type 2 diabetes mellitus with diabetic chronic kidney disease: Secondary | ICD-10-CM | POA: Diagnosis not present

## 2024-09-05 ENCOUNTER — Other Ambulatory Visit: Payer: Self-pay | Admitting: Internal Medicine

## 2024-09-07 ENCOUNTER — Ambulatory Visit: Admitting: Podiatry

## 2024-09-08 LAB — OPHTHALMOLOGY REPORT-SCANNED

## 2024-09-13 ENCOUNTER — Encounter: Payer: Self-pay | Admitting: *Deleted

## 2024-09-13 NOTE — Progress Notes (Signed)
 Levi Hodges                                          MRN: [id number]   09/13/2024   The [facility] Quality Team Specialist reviewed this patient medical record for the purposes of chart review for care gap closure. The following were reviewed: chart review for care gap closure-colorectal cancer screening and glycemic status assessment.    VBCI Quality Team

## 2024-11-01 ENCOUNTER — Other Ambulatory Visit: Payer: Self-pay | Admitting: Internal Medicine

## 2024-11-14 NOTE — Progress Notes (Signed)
 Geoffery Aultman                                          MRN: [id number]   11/14/2024   The [facility]BCI Quality Team Specialist reviewed this patient medical record for the purposes of chart review for care gap closure. The following were reviewed: chart review for care gap closure-glycemic status assessment.    VBCI Quality Team

## 2025-06-26 ENCOUNTER — Ambulatory Visit
